# Patient Record
Sex: Male | Born: 1940
Health system: Southern US, Community
[De-identification: ages and names within clinical notes are randomized; demographics above are authoritative.]

## PROBLEM LIST (undated history)

## (undated) DIAGNOSIS — M25559 Pain in unspecified hip: Secondary | ICD-10-CM

## (undated) DIAGNOSIS — M545 Low back pain, unspecified: Secondary | ICD-10-CM

## (undated) DIAGNOSIS — R972 Elevated prostate specific antigen [PSA]: Secondary | ICD-10-CM

## (undated) DIAGNOSIS — L720 Epidermal cyst: Secondary | ICD-10-CM

## (undated) DIAGNOSIS — I10 Essential (primary) hypertension: Secondary | ICD-10-CM

## (undated) DIAGNOSIS — L300 Nummular dermatitis: Secondary | ICD-10-CM

## (undated) DIAGNOSIS — H40059 Ocular hypertension, unspecified eye: Secondary | ICD-10-CM

## (undated) DIAGNOSIS — M1990 Unspecified osteoarthritis, unspecified site: Secondary | ICD-10-CM

## (undated) DIAGNOSIS — H53001 Unspecified amblyopia, right eye: Secondary | ICD-10-CM

## (undated) DIAGNOSIS — D229 Melanocytic nevi, unspecified: Secondary | ICD-10-CM

## (undated) DIAGNOSIS — R300 Dysuria: Secondary | ICD-10-CM

## (undated) DIAGNOSIS — B399 Histoplasmosis, unspecified: Secondary | ICD-10-CM

## (undated) DIAGNOSIS — H52221 Regular astigmatism, right eye: Secondary | ICD-10-CM

## (undated) DIAGNOSIS — N529 Male erectile dysfunction, unspecified: Secondary | ICD-10-CM

## (undated) DIAGNOSIS — H259 Unspecified age-related cataract: Secondary | ICD-10-CM

## (undated) DIAGNOSIS — M720 Palmar fascial fibromatosis [Dupuytren]: Secondary | ICD-10-CM

## (undated) DIAGNOSIS — C801 Malignant (primary) neoplasm, unspecified: Secondary | ICD-10-CM

## (undated) DIAGNOSIS — R7302 Impaired glucose tolerance (oral): Secondary | ICD-10-CM

## (undated) DIAGNOSIS — H2510 Age-related nuclear cataract, unspecified eye: Secondary | ICD-10-CM

## (undated) DIAGNOSIS — M109 Gout, unspecified: Secondary | ICD-10-CM

## (undated) DIAGNOSIS — Z125 Encounter for screening for malignant neoplasm of prostate: Secondary | ICD-10-CM

## (undated) DIAGNOSIS — Z8042 Family history of malignant neoplasm of prostate: Secondary | ICD-10-CM

## (undated) DIAGNOSIS — E785 Hyperlipidemia, unspecified: Secondary | ICD-10-CM

## (undated) DIAGNOSIS — M2550 Pain in unspecified joint: Secondary | ICD-10-CM

## (undated) DIAGNOSIS — H5202 Hypermetropia, left eye: Secondary | ICD-10-CM

## (undated) DIAGNOSIS — M25512 Pain in left shoulder: Secondary | ICD-10-CM

## (undated) HISTORY — PX: CONSTRICTING FINGER RING EXCISION: SUR505

## (undated) HISTORY — PX: OTHER SURGICAL HISTORY: SHX169

## (undated) HISTORY — PX: CYST REMOVAL TRUNK: SHX6283

---

## 2000-09-12 ENCOUNTER — Ambulatory Visit (HOSPITAL_COMMUNITY): Admission: RE | Admit: 2000-09-12 | Discharge: 2000-09-12 | Payer: Self-pay | Admitting: Gastroenterology

## 2011-08-24 DIAGNOSIS — Z125 Encounter for screening for malignant neoplasm of prostate: Secondary | ICD-10-CM | POA: Diagnosis not present

## 2011-08-24 DIAGNOSIS — R82998 Other abnormal findings in urine: Secondary | ICD-10-CM | POA: Diagnosis not present

## 2011-08-24 DIAGNOSIS — M109 Gout, unspecified: Secondary | ICD-10-CM | POA: Diagnosis not present

## 2011-08-24 DIAGNOSIS — I10 Essential (primary) hypertension: Secondary | ICD-10-CM | POA: Diagnosis not present

## 2011-08-24 DIAGNOSIS — E785 Hyperlipidemia, unspecified: Secondary | ICD-10-CM | POA: Diagnosis not present

## 2011-08-31 DIAGNOSIS — I10 Essential (primary) hypertension: Secondary | ICD-10-CM | POA: Diagnosis not present

## 2011-08-31 DIAGNOSIS — Z0000 Encounter for general adult medical examination without abnormal findings: Secondary | ICD-10-CM | POA: Diagnosis not present

## 2011-08-31 DIAGNOSIS — Z125 Encounter for screening for malignant neoplasm of prostate: Secondary | ICD-10-CM | POA: Diagnosis not present

## 2011-08-31 DIAGNOSIS — E785 Hyperlipidemia, unspecified: Secondary | ICD-10-CM | POA: Diagnosis not present

## 2011-08-31 DIAGNOSIS — R7301 Impaired fasting glucose: Secondary | ICD-10-CM | POA: Diagnosis not present

## 2011-08-31 DIAGNOSIS — E739 Lactose intolerance, unspecified: Secondary | ICD-10-CM | POA: Diagnosis not present

## 2011-09-03 DIAGNOSIS — D239 Other benign neoplasm of skin, unspecified: Secondary | ICD-10-CM | POA: Diagnosis not present

## 2011-09-03 DIAGNOSIS — L723 Sebaceous cyst: Secondary | ICD-10-CM | POA: Diagnosis not present

## 2011-09-06 DIAGNOSIS — Z1212 Encounter for screening for malignant neoplasm of rectum: Secondary | ICD-10-CM | POA: Diagnosis not present

## 2011-11-05 DIAGNOSIS — M25559 Pain in unspecified hip: Secondary | ICD-10-CM | POA: Diagnosis not present

## 2011-11-05 DIAGNOSIS — M545 Low back pain, unspecified: Secondary | ICD-10-CM | POA: Diagnosis not present

## 2012-02-27 DIAGNOSIS — H31009 Unspecified chorioretinal scars, unspecified eye: Secondary | ICD-10-CM | POA: Diagnosis not present

## 2012-02-27 DIAGNOSIS — B589 Toxoplasmosis, unspecified: Secondary | ICD-10-CM | POA: Diagnosis not present

## 2012-07-28 DIAGNOSIS — D239 Other benign neoplasm of skin, unspecified: Secondary | ICD-10-CM | POA: Diagnosis not present

## 2012-08-27 DIAGNOSIS — M109 Gout, unspecified: Secondary | ICD-10-CM | POA: Diagnosis not present

## 2012-08-27 DIAGNOSIS — I10 Essential (primary) hypertension: Secondary | ICD-10-CM | POA: Diagnosis not present

## 2012-08-27 DIAGNOSIS — Z79899 Other long term (current) drug therapy: Secondary | ICD-10-CM | POA: Diagnosis not present

## 2012-08-27 DIAGNOSIS — E785 Hyperlipidemia, unspecified: Secondary | ICD-10-CM | POA: Diagnosis not present

## 2012-09-03 DIAGNOSIS — D126 Benign neoplasm of colon, unspecified: Secondary | ICD-10-CM | POA: Diagnosis not present

## 2012-09-03 DIAGNOSIS — Z1331 Encounter for screening for depression: Secondary | ICD-10-CM | POA: Diagnosis not present

## 2012-09-03 DIAGNOSIS — H5440 Blindness, one eye, unspecified eye: Secondary | ICD-10-CM | POA: Diagnosis not present

## 2012-09-03 DIAGNOSIS — M545 Low back pain: Secondary | ICD-10-CM | POA: Diagnosis not present

## 2012-09-03 DIAGNOSIS — I10 Essential (primary) hypertension: Secondary | ICD-10-CM | POA: Diagnosis not present

## 2012-09-03 DIAGNOSIS — N529 Male erectile dysfunction, unspecified: Secondary | ICD-10-CM | POA: Diagnosis not present

## 2012-09-03 DIAGNOSIS — Z0000 Encounter for general adult medical examination without abnormal findings: Secondary | ICD-10-CM | POA: Diagnosis not present

## 2012-09-03 DIAGNOSIS — M1990 Unspecified osteoarthritis, unspecified site: Secondary | ICD-10-CM | POA: Diagnosis not present

## 2012-09-03 DIAGNOSIS — Z125 Encounter for screening for malignant neoplasm of prostate: Secondary | ICD-10-CM | POA: Diagnosis not present

## 2012-09-05 DIAGNOSIS — Z1212 Encounter for screening for malignant neoplasm of rectum: Secondary | ICD-10-CM | POA: Diagnosis not present

## 2013-02-26 DIAGNOSIS — H2510 Age-related nuclear cataract, unspecified eye: Secondary | ICD-10-CM | POA: Diagnosis not present

## 2013-03-25 DIAGNOSIS — L723 Sebaceous cyst: Secondary | ICD-10-CM | POA: Diagnosis not present

## 2013-03-25 DIAGNOSIS — B359 Dermatophytosis, unspecified: Secondary | ICD-10-CM | POA: Diagnosis not present

## 2013-08-31 DIAGNOSIS — R82998 Other abnormal findings in urine: Secondary | ICD-10-CM | POA: Diagnosis not present

## 2013-08-31 DIAGNOSIS — E785 Hyperlipidemia, unspecified: Secondary | ICD-10-CM | POA: Diagnosis not present

## 2013-08-31 DIAGNOSIS — I10 Essential (primary) hypertension: Secondary | ICD-10-CM | POA: Diagnosis not present

## 2013-08-31 DIAGNOSIS — M109 Gout, unspecified: Secondary | ICD-10-CM | POA: Diagnosis not present

## 2013-08-31 DIAGNOSIS — R7301 Impaired fasting glucose: Secondary | ICD-10-CM | POA: Diagnosis not present

## 2013-08-31 DIAGNOSIS — Z125 Encounter for screening for malignant neoplasm of prostate: Secondary | ICD-10-CM | POA: Diagnosis not present

## 2013-09-07 DIAGNOSIS — E669 Obesity, unspecified: Secondary | ICD-10-CM | POA: Diagnosis not present

## 2013-09-07 DIAGNOSIS — M109 Gout, unspecified: Secondary | ICD-10-CM | POA: Diagnosis not present

## 2013-09-07 DIAGNOSIS — R7301 Impaired fasting glucose: Secondary | ICD-10-CM | POA: Diagnosis not present

## 2013-09-07 DIAGNOSIS — Z1212 Encounter for screening for malignant neoplasm of rectum: Secondary | ICD-10-CM | POA: Diagnosis not present

## 2013-09-07 DIAGNOSIS — Z0000 Encounter for general adult medical examination without abnormal findings: Secondary | ICD-10-CM | POA: Diagnosis not present

## 2013-09-07 DIAGNOSIS — I10 Essential (primary) hypertension: Secondary | ICD-10-CM | POA: Diagnosis not present

## 2013-09-07 DIAGNOSIS — D126 Benign neoplasm of colon, unspecified: Secondary | ICD-10-CM | POA: Diagnosis not present

## 2013-09-07 DIAGNOSIS — Z1331 Encounter for screening for depression: Secondary | ICD-10-CM | POA: Diagnosis not present

## 2013-09-07 DIAGNOSIS — M1990 Unspecified osteoarthritis, unspecified site: Secondary | ICD-10-CM | POA: Diagnosis not present

## 2013-09-07 DIAGNOSIS — E785 Hyperlipidemia, unspecified: Secondary | ICD-10-CM | POA: Diagnosis not present

## 2013-12-23 DIAGNOSIS — L259 Unspecified contact dermatitis, unspecified cause: Secondary | ICD-10-CM | POA: Diagnosis not present

## 2014-03-17 DIAGNOSIS — H2589 Other age-related cataract: Secondary | ICD-10-CM | POA: Diagnosis not present

## 2014-03-17 DIAGNOSIS — H52229 Regular astigmatism, unspecified eye: Secondary | ICD-10-CM | POA: Diagnosis not present

## 2014-03-17 DIAGNOSIS — H219 Unspecified disorder of iris and ciliary body: Secondary | ICD-10-CM | POA: Diagnosis not present

## 2014-03-17 DIAGNOSIS — H32 Chorioretinal disorders in diseases classified elsewhere: Secondary | ICD-10-CM | POA: Diagnosis not present

## 2014-03-17 DIAGNOSIS — B399 Histoplasmosis, unspecified: Secondary | ICD-10-CM | POA: Diagnosis not present

## 2014-03-17 DIAGNOSIS — H35039 Hypertensive retinopathy, unspecified eye: Secondary | ICD-10-CM | POA: Diagnosis not present

## 2014-08-05 DIAGNOSIS — H25819 Combined forms of age-related cataract, unspecified eye: Secondary | ICD-10-CM | POA: Diagnosis not present

## 2014-09-20 DIAGNOSIS — Z125 Encounter for screening for malignant neoplasm of prostate: Secondary | ICD-10-CM | POA: Diagnosis not present

## 2014-09-20 DIAGNOSIS — R7302 Impaired glucose tolerance (oral): Secondary | ICD-10-CM | POA: Diagnosis not present

## 2014-09-20 DIAGNOSIS — E785 Hyperlipidemia, unspecified: Secondary | ICD-10-CM | POA: Diagnosis not present

## 2014-09-20 DIAGNOSIS — M109 Gout, unspecified: Secondary | ICD-10-CM | POA: Diagnosis not present

## 2014-09-20 DIAGNOSIS — I10 Essential (primary) hypertension: Secondary | ICD-10-CM | POA: Diagnosis not present

## 2014-09-20 DIAGNOSIS — Z008 Encounter for other general examination: Secondary | ICD-10-CM | POA: Diagnosis not present

## 2014-09-27 DIAGNOSIS — Z6832 Body mass index (BMI) 32.0-32.9, adult: Secondary | ICD-10-CM | POA: Diagnosis not present

## 2014-09-27 DIAGNOSIS — M545 Low back pain: Secondary | ICD-10-CM | POA: Diagnosis not present

## 2014-09-27 DIAGNOSIS — H5441 Blindness, right eye, normal vision left eye: Secondary | ICD-10-CM | POA: Diagnosis not present

## 2014-09-27 DIAGNOSIS — Z1212 Encounter for screening for malignant neoplasm of rectum: Secondary | ICD-10-CM | POA: Diagnosis not present

## 2014-09-27 DIAGNOSIS — D126 Benign neoplasm of colon, unspecified: Secondary | ICD-10-CM | POA: Diagnosis not present

## 2014-09-27 DIAGNOSIS — I10 Essential (primary) hypertension: Secondary | ICD-10-CM | POA: Diagnosis not present

## 2014-09-27 DIAGNOSIS — E785 Hyperlipidemia, unspecified: Secondary | ICD-10-CM | POA: Diagnosis not present

## 2014-09-27 DIAGNOSIS — M1990 Unspecified osteoarthritis, unspecified site: Secondary | ICD-10-CM | POA: Diagnosis not present

## 2014-09-27 DIAGNOSIS — M109 Gout, unspecified: Secondary | ICD-10-CM | POA: Diagnosis not present

## 2014-09-27 DIAGNOSIS — Z0000 Encounter for general adult medical examination without abnormal findings: Secondary | ICD-10-CM | POA: Diagnosis not present

## 2014-09-27 DIAGNOSIS — Z23 Encounter for immunization: Secondary | ICD-10-CM | POA: Diagnosis not present

## 2014-09-27 DIAGNOSIS — R7302 Impaired glucose tolerance (oral): Secondary | ICD-10-CM | POA: Diagnosis not present

## 2014-10-13 DIAGNOSIS — M25512 Pain in left shoulder: Secondary | ICD-10-CM | POA: Diagnosis not present

## 2014-10-15 DIAGNOSIS — B958 Unspecified staphylococcus as the cause of diseases classified elsewhere: Secondary | ICD-10-CM | POA: Diagnosis not present

## 2014-10-15 DIAGNOSIS — L723 Sebaceous cyst: Secondary | ICD-10-CM | POA: Diagnosis not present

## 2014-10-15 DIAGNOSIS — T798XXA Other early complications of trauma, initial encounter: Secondary | ICD-10-CM | POA: Diagnosis not present

## 2014-10-22 DIAGNOSIS — Z1212 Encounter for screening for malignant neoplasm of rectum: Secondary | ICD-10-CM | POA: Diagnosis not present

## 2015-02-01 DIAGNOSIS — L723 Sebaceous cyst: Secondary | ICD-10-CM | POA: Diagnosis not present

## 2015-02-01 DIAGNOSIS — D239 Other benign neoplasm of skin, unspecified: Secondary | ICD-10-CM | POA: Diagnosis not present

## 2015-03-02 DIAGNOSIS — B399 Histoplasmosis, unspecified: Secondary | ICD-10-CM | POA: Diagnosis not present

## 2015-03-02 DIAGNOSIS — H25813 Combined forms of age-related cataract, bilateral: Secondary | ICD-10-CM | POA: Diagnosis not present

## 2015-03-02 DIAGNOSIS — H5202 Hypermetropia, left eye: Secondary | ICD-10-CM | POA: Diagnosis not present

## 2015-03-02 DIAGNOSIS — H31003 Unspecified chorioretinal scars, bilateral: Secondary | ICD-10-CM | POA: Diagnosis not present

## 2015-03-02 DIAGNOSIS — H52222 Regular astigmatism, left eye: Secondary | ICD-10-CM | POA: Diagnosis not present

## 2015-07-18 DIAGNOSIS — M720 Palmar fascial fibromatosis [Dupuytren]: Secondary | ICD-10-CM | POA: Diagnosis not present

## 2015-08-23 ENCOUNTER — Other Ambulatory Visit: Payer: Self-pay | Admitting: Orthopedic Surgery

## 2015-08-23 DIAGNOSIS — G8918 Other acute postprocedural pain: Secondary | ICD-10-CM | POA: Diagnosis not present

## 2015-08-23 DIAGNOSIS — M720 Palmar fascial fibromatosis [Dupuytren]: Secondary | ICD-10-CM | POA: Diagnosis not present

## 2015-09-05 DIAGNOSIS — M720 Palmar fascial fibromatosis [Dupuytren]: Secondary | ICD-10-CM | POA: Diagnosis not present

## 2015-09-07 DIAGNOSIS — Z4889 Encounter for other specified surgical aftercare: Secondary | ICD-10-CM | POA: Diagnosis not present

## 2015-09-07 DIAGNOSIS — M720 Palmar fascial fibromatosis [Dupuytren]: Secondary | ICD-10-CM | POA: Diagnosis not present

## 2015-09-12 DIAGNOSIS — M720 Palmar fascial fibromatosis [Dupuytren]: Secondary | ICD-10-CM | POA: Diagnosis not present

## 2015-09-19 DIAGNOSIS — M720 Palmar fascial fibromatosis [Dupuytren]: Secondary | ICD-10-CM | POA: Diagnosis not present

## 2015-10-03 DIAGNOSIS — M109 Gout, unspecified: Secondary | ICD-10-CM | POA: Diagnosis not present

## 2015-10-03 DIAGNOSIS — E784 Other hyperlipidemia: Secondary | ICD-10-CM | POA: Diagnosis not present

## 2015-10-03 DIAGNOSIS — N390 Urinary tract infection, site not specified: Secondary | ICD-10-CM | POA: Diagnosis not present

## 2015-10-03 DIAGNOSIS — Z125 Encounter for screening for malignant neoplasm of prostate: Secondary | ICD-10-CM | POA: Diagnosis not present

## 2015-10-03 DIAGNOSIS — R7302 Impaired glucose tolerance (oral): Secondary | ICD-10-CM | POA: Diagnosis not present

## 2015-10-03 DIAGNOSIS — R8299 Other abnormal findings in urine: Secondary | ICD-10-CM | POA: Diagnosis not present

## 2015-10-03 DIAGNOSIS — I10 Essential (primary) hypertension: Secondary | ICD-10-CM | POA: Diagnosis not present

## 2015-10-04 DIAGNOSIS — Z4789 Encounter for other orthopedic aftercare: Secondary | ICD-10-CM | POA: Diagnosis not present

## 2015-10-04 DIAGNOSIS — M720 Palmar fascial fibromatosis [Dupuytren]: Secondary | ICD-10-CM | POA: Diagnosis not present

## 2015-10-10 DIAGNOSIS — H5441 Blindness, right eye, normal vision left eye: Secondary | ICD-10-CM | POA: Diagnosis not present

## 2015-10-10 DIAGNOSIS — D126 Benign neoplasm of colon, unspecified: Secondary | ICD-10-CM | POA: Diagnosis not present

## 2015-10-10 DIAGNOSIS — N400 Enlarged prostate without lower urinary tract symptoms: Secondary | ICD-10-CM | POA: Diagnosis not present

## 2015-10-10 DIAGNOSIS — R7302 Impaired glucose tolerance (oral): Secondary | ICD-10-CM | POA: Diagnosis not present

## 2015-10-10 DIAGNOSIS — I10 Essential (primary) hypertension: Secondary | ICD-10-CM | POA: Diagnosis not present

## 2015-10-10 DIAGNOSIS — Z0000 Encounter for general adult medical examination without abnormal findings: Secondary | ICD-10-CM | POA: Diagnosis not present

## 2015-10-10 DIAGNOSIS — M109 Gout, unspecified: Secondary | ICD-10-CM | POA: Diagnosis not present

## 2015-10-10 DIAGNOSIS — E669 Obesity, unspecified: Secondary | ICD-10-CM | POA: Diagnosis not present

## 2015-10-10 DIAGNOSIS — M1990 Unspecified osteoarthritis, unspecified site: Secondary | ICD-10-CM | POA: Diagnosis not present

## 2015-10-10 DIAGNOSIS — E784 Other hyperlipidemia: Secondary | ICD-10-CM | POA: Diagnosis not present

## 2015-10-10 DIAGNOSIS — Z1389 Encounter for screening for other disorder: Secondary | ICD-10-CM | POA: Diagnosis not present

## 2015-10-10 DIAGNOSIS — Z6833 Body mass index (BMI) 33.0-33.9, adult: Secondary | ICD-10-CM | POA: Diagnosis not present

## 2015-10-18 DIAGNOSIS — Z1212 Encounter for screening for malignant neoplasm of rectum: Secondary | ICD-10-CM | POA: Diagnosis not present

## 2015-11-03 DIAGNOSIS — Z4789 Encounter for other orthopedic aftercare: Secondary | ICD-10-CM | POA: Diagnosis not present

## 2016-02-15 DIAGNOSIS — D239 Other benign neoplasm of skin, unspecified: Secondary | ICD-10-CM | POA: Diagnosis not present

## 2016-02-15 DIAGNOSIS — L300 Nummular dermatitis: Secondary | ICD-10-CM | POA: Diagnosis not present

## 2016-02-20 DIAGNOSIS — Z86010 Personal history of colonic polyps: Secondary | ICD-10-CM | POA: Diagnosis not present

## 2016-02-20 DIAGNOSIS — K5730 Diverticulosis of large intestine without perforation or abscess without bleeding: Secondary | ICD-10-CM | POA: Diagnosis not present

## 2016-03-01 DIAGNOSIS — H52222 Regular astigmatism, left eye: Secondary | ICD-10-CM | POA: Diagnosis not present

## 2016-03-01 DIAGNOSIS — H5202 Hypermetropia, left eye: Secondary | ICD-10-CM | POA: Diagnosis not present

## 2016-03-01 DIAGNOSIS — H53001 Unspecified amblyopia, right eye: Secondary | ICD-10-CM | POA: Diagnosis not present

## 2016-03-01 DIAGNOSIS — H25813 Combined forms of age-related cataract, bilateral: Secondary | ICD-10-CM | POA: Diagnosis not present

## 2016-03-01 DIAGNOSIS — B399 Histoplasmosis, unspecified: Secondary | ICD-10-CM | POA: Diagnosis not present

## 2016-04-07 DIAGNOSIS — Z23 Encounter for immunization: Secondary | ICD-10-CM | POA: Diagnosis not present

## 2016-10-15 DIAGNOSIS — R8299 Other abnormal findings in urine: Secondary | ICD-10-CM | POA: Diagnosis not present

## 2016-10-15 DIAGNOSIS — E784 Other hyperlipidemia: Secondary | ICD-10-CM | POA: Diagnosis not present

## 2016-10-15 DIAGNOSIS — N390 Urinary tract infection, site not specified: Secondary | ICD-10-CM | POA: Diagnosis not present

## 2016-10-15 DIAGNOSIS — M109 Gout, unspecified: Secondary | ICD-10-CM | POA: Diagnosis not present

## 2016-10-15 DIAGNOSIS — R7302 Impaired glucose tolerance (oral): Secondary | ICD-10-CM | POA: Diagnosis not present

## 2016-10-15 DIAGNOSIS — Z125 Encounter for screening for malignant neoplasm of prostate: Secondary | ICD-10-CM | POA: Diagnosis not present

## 2016-10-15 DIAGNOSIS — I10 Essential (primary) hypertension: Secondary | ICD-10-CM | POA: Diagnosis not present

## 2016-10-22 DIAGNOSIS — N400 Enlarged prostate without lower urinary tract symptoms: Secondary | ICD-10-CM | POA: Diagnosis not present

## 2016-10-22 DIAGNOSIS — I10 Essential (primary) hypertension: Secondary | ICD-10-CM | POA: Diagnosis not present

## 2016-10-22 DIAGNOSIS — E669 Obesity, unspecified: Secondary | ICD-10-CM | POA: Diagnosis not present

## 2016-10-22 DIAGNOSIS — E784 Other hyperlipidemia: Secondary | ICD-10-CM | POA: Diagnosis not present

## 2016-10-22 DIAGNOSIS — R7302 Impaired glucose tolerance (oral): Secondary | ICD-10-CM | POA: Diagnosis not present

## 2016-10-22 DIAGNOSIS — D126 Benign neoplasm of colon, unspecified: Secondary | ICD-10-CM | POA: Diagnosis not present

## 2016-10-22 DIAGNOSIS — Z6833 Body mass index (BMI) 33.0-33.9, adult: Secondary | ICD-10-CM | POA: Diagnosis not present

## 2016-10-22 DIAGNOSIS — Z0000 Encounter for general adult medical examination without abnormal findings: Secondary | ICD-10-CM | POA: Diagnosis not present

## 2016-10-22 DIAGNOSIS — Z1389 Encounter for screening for other disorder: Secondary | ICD-10-CM | POA: Diagnosis not present

## 2016-10-22 DIAGNOSIS — M109 Gout, unspecified: Secondary | ICD-10-CM | POA: Diagnosis not present

## 2016-10-22 DIAGNOSIS — M1990 Unspecified osteoarthritis, unspecified site: Secondary | ICD-10-CM | POA: Diagnosis not present

## 2016-11-26 DIAGNOSIS — I10 Essential (primary) hypertension: Secondary | ICD-10-CM | POA: Diagnosis not present

## 2016-11-26 DIAGNOSIS — E669 Obesity, unspecified: Secondary | ICD-10-CM | POA: Diagnosis not present

## 2016-11-26 DIAGNOSIS — Z6833 Body mass index (BMI) 33.0-33.9, adult: Secondary | ICD-10-CM | POA: Diagnosis not present

## 2017-02-20 DIAGNOSIS — L720 Epidermal cyst: Secondary | ICD-10-CM | POA: Diagnosis not present

## 2017-02-20 DIAGNOSIS — L821 Other seborrheic keratosis: Secondary | ICD-10-CM | POA: Diagnosis not present

## 2017-02-20 DIAGNOSIS — L959 Vasculitis limited to the skin, unspecified: Secondary | ICD-10-CM | POA: Diagnosis not present

## 2017-02-20 DIAGNOSIS — D229 Melanocytic nevi, unspecified: Secondary | ICD-10-CM | POA: Diagnosis not present

## 2017-03-06 DIAGNOSIS — H2512 Age-related nuclear cataract, left eye: Secondary | ICD-10-CM | POA: Diagnosis not present

## 2017-03-06 DIAGNOSIS — H524 Presbyopia: Secondary | ICD-10-CM | POA: Diagnosis not present

## 2017-03-06 DIAGNOSIS — H5202 Hypermetropia, left eye: Secondary | ICD-10-CM | POA: Diagnosis not present

## 2017-03-06 DIAGNOSIS — H52222 Regular astigmatism, left eye: Secondary | ICD-10-CM | POA: Diagnosis not present

## 2017-03-06 DIAGNOSIS — B399 Histoplasmosis, unspecified: Secondary | ICD-10-CM | POA: Diagnosis not present

## 2017-04-22 DIAGNOSIS — Z6833 Body mass index (BMI) 33.0-33.9, adult: Secondary | ICD-10-CM | POA: Diagnosis not present

## 2017-04-22 DIAGNOSIS — R7302 Impaired glucose tolerance (oral): Secondary | ICD-10-CM | POA: Diagnosis not present

## 2017-04-22 DIAGNOSIS — I10 Essential (primary) hypertension: Secondary | ICD-10-CM | POA: Diagnosis not present

## 2017-04-22 DIAGNOSIS — E7849 Other hyperlipidemia: Secondary | ICD-10-CM | POA: Diagnosis not present

## 2017-04-22 DIAGNOSIS — M109 Gout, unspecified: Secondary | ICD-10-CM | POA: Diagnosis not present

## 2017-04-22 DIAGNOSIS — E668 Other obesity: Secondary | ICD-10-CM | POA: Diagnosis not present

## 2017-04-22 DIAGNOSIS — Z23 Encounter for immunization: Secondary | ICD-10-CM | POA: Diagnosis not present

## 2017-11-06 DIAGNOSIS — Z125 Encounter for screening for malignant neoplasm of prostate: Secondary | ICD-10-CM | POA: Diagnosis not present

## 2017-11-06 DIAGNOSIS — R82998 Other abnormal findings in urine: Secondary | ICD-10-CM | POA: Diagnosis not present

## 2017-11-06 DIAGNOSIS — R7302 Impaired glucose tolerance (oral): Secondary | ICD-10-CM | POA: Diagnosis not present

## 2017-11-06 DIAGNOSIS — I10 Essential (primary) hypertension: Secondary | ICD-10-CM | POA: Diagnosis not present

## 2017-11-06 DIAGNOSIS — E7849 Other hyperlipidemia: Secondary | ICD-10-CM | POA: Diagnosis not present

## 2017-11-06 DIAGNOSIS — M109 Gout, unspecified: Secondary | ICD-10-CM | POA: Diagnosis not present

## 2017-11-13 DIAGNOSIS — D126 Benign neoplasm of colon, unspecified: Secondary | ICD-10-CM | POA: Diagnosis not present

## 2017-11-13 DIAGNOSIS — E7849 Other hyperlipidemia: Secondary | ICD-10-CM | POA: Diagnosis not present

## 2017-11-13 DIAGNOSIS — M21942 Unspecified acquired deformity of hand, left hand: Secondary | ICD-10-CM | POA: Diagnosis not present

## 2017-11-13 DIAGNOSIS — M109 Gout, unspecified: Secondary | ICD-10-CM | POA: Diagnosis not present

## 2017-11-13 DIAGNOSIS — I10 Essential (primary) hypertension: Secondary | ICD-10-CM | POA: Diagnosis not present

## 2017-11-13 DIAGNOSIS — Z1389 Encounter for screening for other disorder: Secondary | ICD-10-CM | POA: Diagnosis not present

## 2017-11-13 DIAGNOSIS — E668 Other obesity: Secondary | ICD-10-CM | POA: Diagnosis not present

## 2017-11-13 DIAGNOSIS — M1990 Unspecified osteoarthritis, unspecified site: Secondary | ICD-10-CM | POA: Diagnosis not present

## 2017-11-13 DIAGNOSIS — R7302 Impaired glucose tolerance (oral): Secondary | ICD-10-CM | POA: Diagnosis not present

## 2017-11-13 DIAGNOSIS — Z6832 Body mass index (BMI) 32.0-32.9, adult: Secondary | ICD-10-CM | POA: Diagnosis not present

## 2017-11-13 DIAGNOSIS — Z0000 Encounter for general adult medical examination without abnormal findings: Secondary | ICD-10-CM | POA: Diagnosis not present

## 2017-11-13 DIAGNOSIS — N400 Enlarged prostate without lower urinary tract symptoms: Secondary | ICD-10-CM | POA: Diagnosis not present

## 2017-11-15 DIAGNOSIS — Z1212 Encounter for screening for malignant neoplasm of rectum: Secondary | ICD-10-CM | POA: Diagnosis not present

## 2018-01-03 DIAGNOSIS — M7581 Other shoulder lesions, right shoulder: Secondary | ICD-10-CM | POA: Diagnosis not present

## 2018-02-19 DIAGNOSIS — L720 Epidermal cyst: Secondary | ICD-10-CM | POA: Diagnosis not present

## 2018-02-19 DIAGNOSIS — D229 Melanocytic nevi, unspecified: Secondary | ICD-10-CM | POA: Diagnosis not present

## 2018-02-19 DIAGNOSIS — L959 Vasculitis limited to the skin, unspecified: Secondary | ICD-10-CM | POA: Diagnosis not present

## 2018-03-08 DIAGNOSIS — Z23 Encounter for immunization: Secondary | ICD-10-CM | POA: Diagnosis not present

## 2018-04-25 DIAGNOSIS — H5202 Hypermetropia, left eye: Secondary | ICD-10-CM | POA: Diagnosis not present

## 2018-04-25 DIAGNOSIS — B399 Histoplasmosis, unspecified: Secondary | ICD-10-CM | POA: Diagnosis not present

## 2018-04-25 DIAGNOSIS — H52222 Regular astigmatism, left eye: Secondary | ICD-10-CM | POA: Diagnosis not present

## 2018-04-25 DIAGNOSIS — H25813 Combined forms of age-related cataract, bilateral: Secondary | ICD-10-CM | POA: Diagnosis not present

## 2018-04-25 DIAGNOSIS — H53001 Unspecified amblyopia, right eye: Secondary | ICD-10-CM | POA: Diagnosis not present

## 2018-04-25 DIAGNOSIS — H33309 Unspecified retinal break, unspecified eye: Secondary | ICD-10-CM | POA: Diagnosis not present

## 2018-05-12 DIAGNOSIS — Z6831 Body mass index (BMI) 31.0-31.9, adult: Secondary | ICD-10-CM | POA: Diagnosis not present

## 2018-05-12 DIAGNOSIS — R7302 Impaired glucose tolerance (oral): Secondary | ICD-10-CM | POA: Diagnosis not present

## 2018-05-12 DIAGNOSIS — E668 Other obesity: Secondary | ICD-10-CM | POA: Diagnosis not present

## 2018-05-12 DIAGNOSIS — E7849 Other hyperlipidemia: Secondary | ICD-10-CM | POA: Diagnosis not present

## 2018-05-12 DIAGNOSIS — M109 Gout, unspecified: Secondary | ICD-10-CM | POA: Diagnosis not present

## 2018-05-12 DIAGNOSIS — I10 Essential (primary) hypertension: Secondary | ICD-10-CM | POA: Diagnosis not present

## 2018-05-12 DIAGNOSIS — M1990 Unspecified osteoarthritis, unspecified site: Secondary | ICD-10-CM | POA: Diagnosis not present

## 2018-10-01 DIAGNOSIS — M7581 Other shoulder lesions, right shoulder: Secondary | ICD-10-CM | POA: Diagnosis not present

## 2018-10-28 DIAGNOSIS — H25813 Combined forms of age-related cataract, bilateral: Secondary | ICD-10-CM | POA: Diagnosis not present

## 2018-10-28 DIAGNOSIS — H40059 Ocular hypertension, unspecified eye: Secondary | ICD-10-CM | POA: Diagnosis not present

## 2018-11-12 DIAGNOSIS — R7302 Impaired glucose tolerance (oral): Secondary | ICD-10-CM | POA: Diagnosis not present

## 2018-11-12 DIAGNOSIS — E7849 Other hyperlipidemia: Secondary | ICD-10-CM | POA: Diagnosis not present

## 2018-11-12 DIAGNOSIS — I10 Essential (primary) hypertension: Secondary | ICD-10-CM | POA: Diagnosis not present

## 2018-11-12 DIAGNOSIS — Z125 Encounter for screening for malignant neoplasm of prostate: Secondary | ICD-10-CM | POA: Diagnosis not present

## 2018-11-12 DIAGNOSIS — M109 Gout, unspecified: Secondary | ICD-10-CM | POA: Diagnosis not present

## 2018-11-13 DIAGNOSIS — R82998 Other abnormal findings in urine: Secondary | ICD-10-CM | POA: Diagnosis not present

## 2018-11-13 DIAGNOSIS — I10 Essential (primary) hypertension: Secondary | ICD-10-CM | POA: Diagnosis not present

## 2018-11-19 DIAGNOSIS — Z0000 Encounter for general adult medical examination without abnormal findings: Secondary | ICD-10-CM | POA: Diagnosis not present

## 2018-11-19 DIAGNOSIS — E785 Hyperlipidemia, unspecified: Secondary | ICD-10-CM | POA: Diagnosis not present

## 2018-11-19 DIAGNOSIS — R7302 Impaired glucose tolerance (oral): Secondary | ICD-10-CM | POA: Diagnosis not present

## 2018-11-19 DIAGNOSIS — D126 Benign neoplasm of colon, unspecified: Secondary | ICD-10-CM | POA: Diagnosis not present

## 2018-11-19 DIAGNOSIS — M1990 Unspecified osteoarthritis, unspecified site: Secondary | ICD-10-CM | POA: Diagnosis not present

## 2018-11-19 DIAGNOSIS — I10 Essential (primary) hypertension: Secondary | ICD-10-CM | POA: Diagnosis not present

## 2018-11-19 DIAGNOSIS — E669 Obesity, unspecified: Secondary | ICD-10-CM | POA: Diagnosis not present

## 2018-11-19 DIAGNOSIS — H54415A Blindness right eye category 5, normal vision left eye: Secondary | ICD-10-CM | POA: Diagnosis not present

## 2018-11-19 DIAGNOSIS — Z1331 Encounter for screening for depression: Secondary | ICD-10-CM | POA: Diagnosis not present

## 2018-11-19 DIAGNOSIS — M109 Gout, unspecified: Secondary | ICD-10-CM | POA: Diagnosis not present

## 2018-11-19 DIAGNOSIS — N390 Urinary tract infection, site not specified: Secondary | ICD-10-CM | POA: Diagnosis not present

## 2018-11-19 DIAGNOSIS — N401 Enlarged prostate with lower urinary tract symptoms: Secondary | ICD-10-CM | POA: Diagnosis not present

## 2019-02-18 DIAGNOSIS — D229 Melanocytic nevi, unspecified: Secondary | ICD-10-CM | POA: Diagnosis not present

## 2019-02-18 DIAGNOSIS — L720 Epidermal cyst: Secondary | ICD-10-CM | POA: Diagnosis not present

## 2019-02-18 DIAGNOSIS — L821 Other seborrheic keratosis: Secondary | ICD-10-CM | POA: Diagnosis not present

## 2019-03-11 ENCOUNTER — Encounter (HOSPITAL_COMMUNITY): Payer: Self-pay | Admitting: Emergency Medicine

## 2019-03-11 ENCOUNTER — Emergency Department (HOSPITAL_COMMUNITY): Payer: Medicare Other

## 2019-03-11 ENCOUNTER — Emergency Department (HOSPITAL_COMMUNITY)
Admission: EM | Admit: 2019-03-11 | Discharge: 2019-03-12 | Disposition: A | Discharge status: Home / Self Care | Payer: Medicare Other | Attending: Emergency Medicine | Admitting: Emergency Medicine

## 2019-03-11 ENCOUNTER — Other Ambulatory Visit: Payer: Self-pay

## 2019-03-11 DIAGNOSIS — R51 Headache: Secondary | ICD-10-CM | POA: Insufficient documentation

## 2019-03-11 DIAGNOSIS — I10 Essential (primary) hypertension: Secondary | ICD-10-CM | POA: Diagnosis not present

## 2019-03-11 DIAGNOSIS — R29810 Facial weakness: Secondary | ICD-10-CM | POA: Diagnosis not present

## 2019-03-11 DIAGNOSIS — R410 Disorientation, unspecified: Secondary | ICD-10-CM | POA: Diagnosis not present

## 2019-03-11 DIAGNOSIS — R52 Pain, unspecified: Secondary | ICD-10-CM | POA: Diagnosis not present

## 2019-03-11 DIAGNOSIS — M542 Cervicalgia: Secondary | ICD-10-CM | POA: Diagnosis not present

## 2019-03-11 LAB — I-STAT CHEM 8, ED
BUN: 22 mg/dL (ref 8–23)
Calcium, Ion: 1.03 mmol/L — ABNORMAL LOW (ref 1.15–1.40)
Chloride: 105 mmol/L (ref 98–111)
Creatinine, Ser: 1.1 mg/dL (ref 0.61–1.24)
Glucose, Bld: 103 mg/dL — ABNORMAL HIGH (ref 70–99)
HCT: 40 % (ref 39.0–52.0)
Hemoglobin: 13.6 g/dL (ref 13.0–17.0)
Potassium: 4.4 mmol/L (ref 3.5–5.1)
Sodium: 139 mmol/L (ref 135–145)
TCO2: 25 mmol/L (ref 22–32)

## 2019-03-11 LAB — APTT: aPTT: 25 seconds (ref 24–36)

## 2019-03-11 LAB — DIFFERENTIAL
Abs Immature Granulocytes: 0.02 10*3/uL (ref 0.00–0.07)
Basophils Absolute: 0.1 10*3/uL (ref 0.0–0.1)
Basophils Relative: 1 %
Eosinophils Absolute: 0.1 10*3/uL (ref 0.0–0.5)
Eosinophils Relative: 2 %
Immature Granulocytes: 0 %
Lymphocytes Relative: 34 %
Lymphs Abs: 2.5 10*3/uL (ref 0.7–4.0)
Monocytes Absolute: 0.8 10*3/uL (ref 0.1–1.0)
Monocytes Relative: 11 %
Neutro Abs: 3.9 10*3/uL (ref 1.7–7.7)
Neutrophils Relative %: 52 %

## 2019-03-11 LAB — CBC
HCT: 41.7 % (ref 39.0–52.0)
Hemoglobin: 14.7 g/dL (ref 13.0–17.0)
MCH: 32 pg (ref 26.0–34.0)
MCHC: 35.3 g/dL (ref 30.0–36.0)
MCV: 90.8 fL (ref 80.0–100.0)
Platelets: 181 10*3/uL (ref 150–400)
RBC: 4.59 MIL/uL (ref 4.22–5.81)
RDW: 11.6 % (ref 11.5–15.5)
WBC: 7.4 10*3/uL (ref 4.0–10.5)
nRBC: 0 % (ref 0.0–0.2)

## 2019-03-11 LAB — COMPREHENSIVE METABOLIC PANEL
ALT: 29 U/L (ref 0–44)
AST: 29 U/L (ref 15–41)
Albumin: 4.4 g/dL (ref 3.5–5.0)
Alkaline Phosphatase: 86 U/L (ref 38–126)
Anion gap: 10 (ref 5–15)
BUN: 20 mg/dL (ref 8–23)
CO2: 23 mmol/L (ref 22–32)
Calcium: 9 mg/dL (ref 8.9–10.3)
Chloride: 105 mmol/L (ref 98–111)
Creatinine, Ser: 1.15 mg/dL (ref 0.61–1.24)
GFR calc Af Amer: >60 mL/min (ref >60)
GFR calc non Af Amer: >60 mL/min (ref >60)
Glucose, Bld: 108 mg/dL — ABNORMAL HIGH (ref 70–99)
Potassium: 4.4 mmol/L (ref 3.5–5.1)
Sodium: 138 mmol/L (ref 135–145)
Total Bilirubin: 1 mg/dL (ref 0.3–1.2)
Total Protein: 7.1 g/dL (ref 6.5–8.1)

## 2019-03-11 LAB — PROTIME-INR
INR: 1 (ref 0.8–1.2)
Prothrombin Time: 13.1 seconds (ref 11.4–15.2)

## 2019-03-11 LAB — CBG MONITORING, ED: Glucose-Capillary: 102 mg/dL — ABNORMAL HIGH (ref 70–99)

## 2019-03-11 MED ORDER — DIPHENHYDRAMINE HCL 50 MG/ML IJ SOLN
25.0000 mg | Freq: Once | INTRAMUSCULAR | Status: AC
  Administered 2019-03-11: 22:00:00 25 mg via INTRAVENOUS
  Filled 2019-03-11: qty 1

## 2019-03-11 MED ORDER — ACETAMINOPHEN 500 MG PO TABS
1000.0000 mg | ORAL_TABLET | Freq: Once | ORAL | Status: AC
  Administered 2019-03-11: 22:00:00 1000 mg via ORAL
  Filled 2019-03-11: qty 2

## 2019-03-11 MED ORDER — PROCHLORPERAZINE EDISYLATE 10 MG/2ML IJ SOLN
10.0000 mg | Freq: Once | INTRAMUSCULAR | Status: AC
  Administered 2019-03-11: 10 mg via INTRAVENOUS
  Filled 2019-03-11: qty 2

## 2019-03-11 MED ORDER — SODIUM CHLORIDE 0.9% FLUSH: 3.0000 mL | Freq: Once | INTRAVENOUS | Status: DC

## 2019-03-11 NOTE — ED Provider Notes (Signed)
[facility] Emergency Department Provider Note MRN:  [id number]  [place] date & time: 03/11/19     Chief Complaint   Code Stroke   History of Present Illness   Chad Moreno is a 78 y.o. year-old male with no pertinent past medical history presenting to the ED with chief complaint of confusion.  Last seen normal 3:30 PM.  Wife came home from work at 6:30 PM and noted perseveration, confusion, issues with concentration.  Code stroke initiated in triage.  Review of Systems  Positive for confusion.  Patient's Health History   History reviewed. No pertinent past medical history.    History reviewed. No pertinent family history.  Social History   Socioeconomic History  . Marital status: Married    Spouse name: Not on file  . Number of children: Not on file  . Years of education: Not on file  . Highest education level: Not on file  Occupational History  . Not on file  Social Needs  . Financial resource strain: Not on file  . Food insecurity    Worry: Not on file    Inability: Not on file  . Transportation needs    Medical: Not on file    Non-medical: Not on file  Tobacco Use  . Smoking status: Not on file  Substance and Sexual Activity  . Alcohol use: Not on file  . Drug use: Not on file  . Sexual activity: Not on file  Lifestyle  . Physical activity    Days per week: Not on file    Minutes per session: Not on file  . Stress: Not on file  Relationships  . Social Herbalist on phone: Not on file    Gets together: Not on file    Attends religious service: Not on file    Active member of club or organization: Not on file    Attends meetings of clubs or organizations: Not on file    Relationship status: Not on file  . Intimate partner violence    Fear of current or ex partner: Not on file    Emotionally abused: Not on file    Physically abused: Not on file    Forced sexual activity: Not on file  Other Topics Concern  . Not on  file  Social History Narrative  . Not on file     Physical Exam  Vital Signs and Nursing Notes reviewed Vitals:   03/11/19 2145 03/11/19 2200  BP: (!) 173/141 (!) 174/84  Pulse: 93 73  Resp: 20 17  Temp:    SpO2: 100% 98%    CONSTITUTIONAL: Well-appearing, NAD NEURO:  Alert and oriented x 3, no focal deficits EYES:  eyes equal and reactive ENT/NECK:  no LAD, no JVD CARDIO: Regular rate, well-perfused, normal S1 and S2 PULM:  CTAB no wheezing or rhonchi GI/GU:  normal bowel sounds, non-distended, non-tender MSK/SPINE:  No gross deformities, no edema SKIN:  no rash, atraumatic PSYCH:  Appropriate speech and behavior  Diagnostic and Interventional Summary    EKG Interpretation  Date/Time:    Ventricular Rate:    PR Interval:    QRS Duration:   QT Interval:    QTC Calculation:   R Axis:     Text Interpretation:        Labs Reviewed  COMPREHENSIVE METABOLIC PANEL - Abnormal; Notable for the following components:      Result Value   Glucose, Bld 108 (*)    All  other components within normal limits  I-STAT CHEM 8, ED - Abnormal; Notable for the following components:   Glucose, Bld 103 (*)    Calcium, Ion 1.03 (*)    All other components within normal limits  PROTIME-INR  APTT  CBC  DIFFERENTIAL  CBG MONITORING, ED    CT HEAD CODE STROKE WO CONTRAST  Final Result    MR BRAIN WO CONTRAST    (Results Pending)    Medications  sodium chloride flush (NS) 0.9 % injection 3 mL (has no administration in time range)  acetaminophen (TYLENOL) tablet 1,000 mg (1,000 mg Oral Given 03/11/19 2204)  diphenhydrAMINE (BENADRYL) injection 25 mg (25 mg Intravenous Given 03/11/19 2205)  prochlorperazine (COMPAZINE) injection 10 mg (10 mg Intravenous Given 03/11/19 2204)     Procedures Critical Care Critical Care Documentation Critical care time provided by me (excluding procedures): 33 minutes  Condition necessitating critical care: Concern for acute ischemic stroke and/or  hypertensive emergency  Components of critical care management: reviewing of prior records, laboratory and imaging interpretation, frequent re-examination and reassessment of vital signs, initiation of code stroke protocol, discussion with consulting services.    ED Course and Medical Decision Making  I have reviewed the triage vital signs and the nursing notes.  Pertinent labs & imaging results that were available during my care of the patient were reviewed by me and considered in my medical decision making (see below for details).  NIH stroke scale of 0 according to neurology.  Patient is well-appearing, no focal neurological deficits on my exam, denies chest pain or shortness of breath, currently without symptoms.  Patient's wife at bedside states that he is still not quite his normal self in terms of mental sharpness.  CT head is without acute concerns, work-up is unremarkable without signs of endorgan damage.  EKG is without ischemic features.  MRI pending as recommended by neurology.  Patient's blood pressure is improving spontaneously.  With a relatively controlled blood pressure and a normal MRI, there will be no reason to admit the patient and he can be discharged with close PCP follow-up.  Signed out to oncoming provider at shift change.  Chad Kirks. Sedonia Small, MD [place] [email or web address]  Final Clinical Impressions(s) / ED Diagnoses     ICD-10-CM   1. Hypertension, unspecified type  I10   2. Confusion and disorientation  R41.0     ED Discharge Orders    None      Discharge Instructions Discussed with and Provided to Patient: Discharge Instructions   None       Maudie Flakes, MD 03/11/19 2252

## 2019-03-11 NOTE — ED Triage Notes (Signed)
  Patient BIB EMS for code stroke.  Patient was LSN at 1830 and complaining of headache.  Patient was with wife at home and started having repetitive questioning and confusion.  Patient is A&O x4 at this time.  Patient does not remember about 30 minutes from when this episode occurred.  NIH 0 on arrival.

## 2019-03-12 ENCOUNTER — Emergency Department (HOSPITAL_COMMUNITY): Payer: Medicare Other

## 2019-03-12 DIAGNOSIS — R51 Headache: Secondary | ICD-10-CM | POA: Diagnosis not present

## 2019-03-12 DIAGNOSIS — R410 Disorientation, unspecified: Secondary | ICD-10-CM | POA: Diagnosis not present

## 2019-03-12 DIAGNOSIS — I10 Essential (primary) hypertension: Secondary | ICD-10-CM | POA: Diagnosis not present

## 2019-03-12 DIAGNOSIS — E669 Obesity, unspecified: Secondary | ICD-10-CM | POA: Diagnosis not present

## 2019-03-12 DIAGNOSIS — E785 Hyperlipidemia, unspecified: Secondary | ICD-10-CM | POA: Diagnosis not present

## 2019-03-12 DIAGNOSIS — I160 Hypertensive urgency: Secondary | ICD-10-CM | POA: Diagnosis not present

## 2019-03-12 LAB — URINALYSIS, ROUTINE W REFLEX MICROSCOPIC
Bilirubin Urine: NEGATIVE
Glucose, UA: NEGATIVE mg/dL
Hgb urine dipstick: NEGATIVE
Ketones, ur: NEGATIVE mg/dL
Leukocytes,Ua: NEGATIVE
Nitrite: NEGATIVE
Protein, ur: NEGATIVE mg/dL
Specific Gravity, Urine: 1.003 — ABNORMAL LOW (ref 1.005–1.030)
pH: 7 (ref 5.0–8.0)

## 2019-03-12 LAB — GLUCOSE, CAPILLARY: Glucose-Capillary: 107 mg/dL — ABNORMAL HIGH (ref 70–99)

## 2019-03-12 NOTE — Discharge Instructions (Signed)
Your MRI was normal.  This means its very unlikely that you had a stroke.  Your blood pressure was high today, please follow up with your family doc as they may need to change your medications.  Return for worsening symptoms.

## 2019-03-12 NOTE — Consult Note (Signed)
Neurology Consultation Reason for Consult: Confusion Referring Physician: Sedonia Small, M  CC: Confusion  History is obtained from: Patient, EMS  HPI: Chad Moreno is a 78 y.o. male with a history of hypertension who presents with altered mental status that started earlier today.  He was in his normal state of health earlier and played a round of golf.  Then he went home.  He has a fuzzy period of time during which she was confused and asking similar questions.  EMS reports extremely high blood pressures in the 123456 systolic.  They also report that he asked the same question of "why are we going to the hospital" multiple times.  He also complains of headache which is right-sided   LKW: 6:30 PM tpa given?: no, mild symptoms    ROS: A 14 point ROS was performed and is negative except as noted in the HPI.   Past medical history: Hypertension   Family medical history: Denies similar  Social History: Has not smoked for 40 years  Exam: Current vital signs: BP (!) 158/81   Pulse 64   Temp 98.4 F (36.9 C) (Oral)   Resp 13   Ht 6\' 1"  (1.854 m)   Wt 104.3 kg   SpO2 99%   BMI 30.34 kg/m  Vital signs in last 24 hours: Temp:  [98.4 F (36.9 C)] 98.4 F (36.9 C) (09/16 2115) Pulse Rate:  [44-93] 64 (09/16 2345) Resp:  [12-20] 13 (09/16 2345) BP: (147-196)/(76-141) 158/81 (09/16 2345) SpO2:  [94 %-100 %] 99 % (09/16 2345) Weight:  [104.3 kg] 104.3 kg (09/16 2117)   Physical Exam  Constitutional: Appears well-developed and well-nourished.  Psych: Affect appropriate to situation Eyes: No scleral injection HENT: No OP obstrucion Head: Normocephalic.  Cardiovascular: Normal rate and regular rhythm.  Respiratory: Effort normal, non-labored breathing GI: Soft.  No distension. There is no tenderness.  Skin: WDI  Neuro: Mental Status: Patient is awake, alert, oriented to person, place, month, year, and situation. Patient is able to give a clear and coherent history. No signs  of aphasia or neglect World backwards = "D-L-O-W" Cranial Nerves: II: Visual Fields are full. Pupils are equal, round, and reactive to light.   III,IV, VI: EOMI without ptosis or diploplia.  V: Facial sensation is symmetric to temperature VII: Facial movement is symmetric.  VIII: hearing is intact to voice X: Uvula elevates symmetrically XI: Shoulder shrug is symmetric. XII: tongue is midline without atrophy or fasciculations.  Motor: Tone is normal. Bulk is normal. 5/5 strength was present in all four extremities.  Sensory: Sensation is symmetric to light touch and temperature in the arms and legs. Cerebellar: FNF and HKS are intact bilaterally  I have reviewed labs in epic and the results pertinent to this consultation are: CMP-unremarkable  I have reviewed the images obtained: CT head-unremarkable  Impression: 78 year old male with headache and confusion in the setting of severe hypertension.  I suspect that this represents mild hypertensive encephalopathy, as his blood pressure is come down he appears to be improving as well.  The description of the character of the confusion does not seem typical for a focal neurological deficit such as aphasia.  The repetitive questioning does broach the possibility of transient global amnesia, however with his BP, would treat this as hypertensive urgency if MRI is negative.   Recommendations: 1) MRI brain, if negative, would treat as hypertensive urgency.    Roland Rack, MD [facility] [phone]  If 7pm- 7am, please page neurology on call  as listed in [place].

## 2019-03-12 NOTE — ED Provider Notes (Signed)
78 yo M with a cc of stroke like symptoms.  The patient had some confusion.  Arrived as a code stroke.  This was called off by the neurologist.  Plan is to obtain an MRI.  If this is negative and the patient's blood pressure is improving we will discharge the patient home.  MRI is returned and is negative for acute stroke.  Will discharge home.   Deno Etienne, DO 03/12/19 0107

## 2019-04-03 DIAGNOSIS — Z23 Encounter for immunization: Secondary | ICD-10-CM | POA: Diagnosis not present

## 2019-05-06 DIAGNOSIS — H40013 Open angle with borderline findings, low risk, bilateral: Secondary | ICD-10-CM | POA: Diagnosis not present

## 2019-05-06 DIAGNOSIS — H53001 Unspecified amblyopia, right eye: Secondary | ICD-10-CM | POA: Diagnosis not present

## 2019-05-06 DIAGNOSIS — H40053 Ocular hypertension, bilateral: Secondary | ICD-10-CM | POA: Diagnosis not present

## 2019-05-06 DIAGNOSIS — H52222 Regular astigmatism, left eye: Secondary | ICD-10-CM | POA: Diagnosis not present

## 2019-05-06 DIAGNOSIS — H5202 Hypermetropia, left eye: Secondary | ICD-10-CM | POA: Diagnosis not present

## 2019-05-06 DIAGNOSIS — H25813 Combined forms of age-related cataract, bilateral: Secondary | ICD-10-CM | POA: Diagnosis not present

## 2019-05-06 DIAGNOSIS — B399 Histoplasmosis, unspecified: Secondary | ICD-10-CM | POA: Diagnosis not present

## 2019-05-08 DIAGNOSIS — R410 Disorientation, unspecified: Secondary | ICD-10-CM | POA: Diagnosis not present

## 2019-05-08 DIAGNOSIS — R7302 Impaired glucose tolerance (oral): Secondary | ICD-10-CM | POA: Diagnosis not present

## 2019-05-08 DIAGNOSIS — I10 Essential (primary) hypertension: Secondary | ICD-10-CM | POA: Diagnosis not present

## 2019-05-08 DIAGNOSIS — I160 Hypertensive urgency: Secondary | ICD-10-CM | POA: Diagnosis not present

## 2019-05-08 DIAGNOSIS — E669 Obesity, unspecified: Secondary | ICD-10-CM | POA: Diagnosis not present

## 2019-05-08 DIAGNOSIS — M109 Gout, unspecified: Secondary | ICD-10-CM | POA: Diagnosis not present

## 2019-05-08 DIAGNOSIS — E785 Hyperlipidemia, unspecified: Secondary | ICD-10-CM | POA: Diagnosis not present

## 2019-06-24 DIAGNOSIS — I160 Hypertensive urgency: Secondary | ICD-10-CM | POA: Diagnosis not present

## 2019-07-10 ENCOUNTER — Ambulatory Visit: Payer: Medicare Other | Attending: Internal Medicine

## 2019-07-10 DIAGNOSIS — Z23 Encounter for immunization: Secondary | ICD-10-CM | POA: Insufficient documentation

## 2019-07-10 NOTE — Progress Notes (Signed)
   Covid-19 Vaccination Clinic  Name:  Chad Moreno    MRN: [id number] DOB: 1941-03-02  07/10/2019  Chad Moreno was observed post Covid-19 immunization for 15 minutes without incidence. He was provided with Vaccine Information Sheet and instruction to access the V-Safe system.   Chad Moreno was instructed to call 911 with any severe reactions post vaccine: Marland Kitchen Difficulty breathing  . Swelling of your face and throat  . A fast heartbeat  . A bad rash all over your body  . Dizziness and weakness    Immunizations Administered    Name Date Dose VIS Date Route   Pfizer COVID-19 Vaccine 07/10/2019  9:41 AM 0.3 mL 06/05/2019 Intramuscular   Manufacturer: [place]   Lot: [id number]   [place]: [id number]

## 2019-07-28 ENCOUNTER — Ambulatory Visit: Payer: Medicare Other

## 2019-07-28 ENCOUNTER — Ambulatory Visit: Payer: Medicare Other | Attending: Internal Medicine

## 2019-07-28 DIAGNOSIS — Z23 Encounter for immunization: Secondary | ICD-10-CM

## 2019-07-28 NOTE — Progress Notes (Signed)
   Covid-19 Vaccination Clinic  Name:  Chad Moreno    MRN: [id number] DOB: 04-14-1941  07/28/2019  [name] was observed post Covid-19 immunization for 15 minutes without incidence. He was provided with Vaccine Information Sheet and instruction to access the V-Safe system.   [name] was instructed to call 911 with any severe reactions post vaccine: Marland Kitchen Difficulty breathing  . Swelling of your face and throat  . A fast heartbeat  . A bad rash all over your body  . Dizziness and weakness    Immunizations Administered    Name Date Dose VIS Date Route   Pfizer COVID-19 Vaccine 07/28/2019  8:30 AM 0.3 mL 06/05/2019 Intramuscular   Manufacturer: [place]   Lot: [id number]   [place]: [id number]

## 2019-08-20 DIAGNOSIS — I10 Essential (primary) hypertension: Secondary | ICD-10-CM | POA: Diagnosis not present

## 2019-11-05 DIAGNOSIS — H40019 Open angle with borderline findings, low risk, unspecified eye: Secondary | ICD-10-CM | POA: Diagnosis not present

## 2019-11-05 DIAGNOSIS — I10 Essential (primary) hypertension: Secondary | ICD-10-CM | POA: Diagnosis not present

## 2019-11-05 DIAGNOSIS — H40059 Ocular hypertension, unspecified eye: Secondary | ICD-10-CM | POA: Diagnosis not present

## 2019-11-05 DIAGNOSIS — H35033 Hypertensive retinopathy, bilateral: Secondary | ICD-10-CM | POA: Diagnosis not present

## 2019-11-13 DIAGNOSIS — M109 Gout, unspecified: Secondary | ICD-10-CM | POA: Diagnosis not present

## 2019-11-13 DIAGNOSIS — R7302 Impaired glucose tolerance (oral): Secondary | ICD-10-CM | POA: Diagnosis not present

## 2019-11-13 DIAGNOSIS — Z125 Encounter for screening for malignant neoplasm of prostate: Secondary | ICD-10-CM | POA: Diagnosis not present

## 2019-11-13 DIAGNOSIS — E7849 Other hyperlipidemia: Secondary | ICD-10-CM | POA: Diagnosis not present

## 2019-11-26 DIAGNOSIS — R82998 Other abnormal findings in urine: Secondary | ICD-10-CM | POA: Diagnosis not present

## 2019-11-26 DIAGNOSIS — R410 Disorientation, unspecified: Secondary | ICD-10-CM | POA: Diagnosis not present

## 2019-11-26 DIAGNOSIS — Z0000 Encounter for general adult medical examination without abnormal findings: Secondary | ICD-10-CM | POA: Diagnosis not present

## 2019-11-26 DIAGNOSIS — Z1331 Encounter for screening for depression: Secondary | ICD-10-CM | POA: Diagnosis not present

## 2019-11-26 DIAGNOSIS — N401 Enlarged prostate with lower urinary tract symptoms: Secondary | ICD-10-CM | POA: Diagnosis not present

## 2019-11-26 DIAGNOSIS — M1990 Unspecified osteoarthritis, unspecified site: Secondary | ICD-10-CM | POA: Diagnosis not present

## 2019-11-26 DIAGNOSIS — E669 Obesity, unspecified: Secondary | ICD-10-CM | POA: Diagnosis not present

## 2019-11-26 DIAGNOSIS — D126 Benign neoplasm of colon, unspecified: Secondary | ICD-10-CM | POA: Diagnosis not present

## 2019-11-26 DIAGNOSIS — R7302 Impaired glucose tolerance (oral): Secondary | ICD-10-CM | POA: Diagnosis not present

## 2019-11-26 DIAGNOSIS — M109 Gout, unspecified: Secondary | ICD-10-CM | POA: Diagnosis not present

## 2019-11-26 DIAGNOSIS — H54415A Blindness right eye category 5, normal vision left eye: Secondary | ICD-10-CM | POA: Diagnosis not present

## 2019-11-26 DIAGNOSIS — E7849 Other hyperlipidemia: Secondary | ICD-10-CM | POA: Diagnosis not present

## 2019-11-26 DIAGNOSIS — I10 Essential (primary) hypertension: Secondary | ICD-10-CM | POA: Diagnosis not present

## 2019-12-03 DIAGNOSIS — Z1212 Encounter for screening for malignant neoplasm of rectum: Secondary | ICD-10-CM | POA: Diagnosis not present

## 2020-02-23 ENCOUNTER — Other Ambulatory Visit: Payer: Self-pay

## 2020-02-23 ENCOUNTER — Ambulatory Visit (INDEPENDENT_AMBULATORY_CARE_PROVIDER_SITE_OTHER): Payer: Medicare Other | Admitting: Dermatology

## 2020-02-23 ENCOUNTER — Encounter: Payer: Self-pay | Admitting: Dermatology

## 2020-02-23 DIAGNOSIS — L959 Vasculitis limited to the skin, unspecified: Secondary | ICD-10-CM | POA: Diagnosis not present

## 2020-02-23 DIAGNOSIS — Z1283 Encounter for screening for malignant neoplasm of skin: Secondary | ICD-10-CM

## 2020-02-23 DIAGNOSIS — L729 Follicular cyst of the skin and subcutaneous tissue, unspecified: Secondary | ICD-10-CM

## 2020-02-23 DIAGNOSIS — L820 Inflamed seborrheic keratosis: Secondary | ICD-10-CM | POA: Diagnosis not present

## 2020-02-23 NOTE — Patient Instructions (Addendum)
Several issues discussed today with Chad Moreno.  For several days he has noticed an asymptomatic breaking out on both lower shins/dorsal feet.  Examination showed clusters of nonpalpable micro petechiae strongly suggestive of golfers vasculitis (interestingly this actually did appear late during a golf outing).  Review of systems negative for systemic vasculitis and no changes in nailbeds.  If this does not spontaneously clear over the next few weeks, he may return for biopsy but I do not think this requires evaluation.  A 48 week old tan rough spot on the right inner calf could be either local inflammation or an irritated keratosis.  Once again, if this persists or grows he may return for biopsy.  Similar spot on the left leg historically resolved itself.  Clinically typical epidermoid cysts on the left outer back shoulder and in the interscapular area can be left; I did remind [name] that there is some risk of inflammation particularly on the shoulder lesion which previously had inflamed.

## 2020-02-23 NOTE — Progress Notes (Signed)
   Follow-Up Visit   Subjective  Chad Moreno is a 79 y.o. male who presents for the following: Annual Exam (right and left leg isk? cyst left shoulder no pain, rash on legs weeds from golf). Rash Location: Feet Duration: Several days Quality: May be fading Associated Signs/Symptoms: None Modifying Factors:  Severity:  Timing: Context: Also would like general skin check.  Objective  Well appearing patient in no apparent distress; mood and affect are within normal limits.  A full examination was performed including scalp, head, eyes, ears, nose, lips, neck, chest, axillae, abdomen, back, buttocks, bilateral upper extremities, bilateral lower extremities, hands, feet, fingers, toes, fingernails, and toenails. All findings within normal limits unless otherwise noted below.   Assessment & Plan    Vasculitis limited to skin (2) Right Ankle - Anterior; Left Lower Leg - Anterior  No intervention or evaluation currently indicated, but if he develops new areas or these do not clear in the next few weeks I do want him to return.  Inflamed seborrheic keratosis Right Lower Leg - Anterior  Return for biopsy if there is growth or bleeding.  Cyst of skin (2) Left Shoulder - Posterior; Mid Back  Discussed elective excision; for now Marcello Moores content to leave these.  Skin cancer screening performed today.  Several issues discussed today with Berle Mull.  For several days he has noticed an asymptomatic breaking out on both lower shins/dorsal feet.  Examination showed clusters of nonpalpable micro petechiae strongly suggestive of golfers vasculitis (interestingly this actually did appear late during a golf outing).  Review of systems negative for systemic vasculitis and no changes in nailbeds.  If this does not spontaneously clear over the next few weeks, he may return for biopsy but I do not think this requires evaluation.  A 77 week old tan rough spot on the right inner calf could be  either local inflammation or an irritated keratosis.  Once again, if this persists or grows he may return for biopsy.  Similar spot on the left leg historically resolved itself.  Clinically typical epidermoid cysts on the left outer back shoulder and in the interscapular area can be left; I did remind Mr. Maiden that there is some risk of inflammation particularly on the shoulder lesion which previously had inflamed.  I, Lavonna Monarch, MD, have reviewed all documentation for this visit.  The documentation on 02/23/20 for the exam, diagnosis, procedures, and orders are all accurate and complete.

## 2020-03-30 DIAGNOSIS — Z23 Encounter for immunization: Secondary | ICD-10-CM | POA: Diagnosis not present

## 2020-04-08 DIAGNOSIS — Z23 Encounter for immunization: Secondary | ICD-10-CM | POA: Diagnosis not present

## 2020-05-12 DIAGNOSIS — H35039 Hypertensive retinopathy, unspecified eye: Secondary | ICD-10-CM | POA: Diagnosis not present

## 2020-05-12 DIAGNOSIS — H25813 Combined forms of age-related cataract, bilateral: Secondary | ICD-10-CM | POA: Diagnosis not present

## 2020-05-12 DIAGNOSIS — H3540 Unspecified peripheral retinal degeneration: Secondary | ICD-10-CM | POA: Diagnosis not present

## 2020-05-12 DIAGNOSIS — B399 Histoplasmosis, unspecified: Secondary | ICD-10-CM | POA: Diagnosis not present

## 2020-05-12 DIAGNOSIS — H33309 Unspecified retinal break, unspecified eye: Secondary | ICD-10-CM | POA: Diagnosis not present

## 2020-05-12 DIAGNOSIS — I10 Essential (primary) hypertension: Secondary | ICD-10-CM | POA: Diagnosis not present

## 2020-05-12 DIAGNOSIS — Q130 Coloboma of iris: Secondary | ICD-10-CM | POA: Diagnosis not present

## 2020-05-12 DIAGNOSIS — H52222 Regular astigmatism, left eye: Secondary | ICD-10-CM | POA: Diagnosis not present

## 2020-05-12 DIAGNOSIS — H5202 Hypermetropia, left eye: Secondary | ICD-10-CM | POA: Diagnosis not present

## 2020-05-16 DIAGNOSIS — M25522 Pain in left elbow: Secondary | ICD-10-CM | POA: Diagnosis not present

## 2020-05-27 DIAGNOSIS — E669 Obesity, unspecified: Secondary | ICD-10-CM | POA: Diagnosis not present

## 2020-05-27 DIAGNOSIS — R7302 Impaired glucose tolerance (oral): Secondary | ICD-10-CM | POA: Diagnosis not present

## 2020-05-27 DIAGNOSIS — M109 Gout, unspecified: Secondary | ICD-10-CM | POA: Diagnosis not present

## 2020-05-27 DIAGNOSIS — I10 Essential (primary) hypertension: Secondary | ICD-10-CM | POA: Diagnosis not present

## 2020-05-27 DIAGNOSIS — N401 Enlarged prostate with lower urinary tract symptoms: Secondary | ICD-10-CM | POA: Diagnosis not present

## 2020-05-27 DIAGNOSIS — D126 Benign neoplasm of colon, unspecified: Secondary | ICD-10-CM | POA: Diagnosis not present

## 2020-05-27 DIAGNOSIS — E785 Hyperlipidemia, unspecified: Secondary | ICD-10-CM | POA: Diagnosis not present

## 2020-05-27 DIAGNOSIS — M1990 Unspecified osteoarthritis, unspecified site: Secondary | ICD-10-CM | POA: Diagnosis not present

## 2021-01-15 DIAGNOSIS — Z20822 Contact with and (suspected) exposure to covid-19: Secondary | ICD-10-CM | POA: Diagnosis not present

## 2021-01-20 DIAGNOSIS — U071 COVID-19: Secondary | ICD-10-CM | POA: Diagnosis not present

## 2021-01-20 DIAGNOSIS — Z20822 Contact with and (suspected) exposure to covid-19: Secondary | ICD-10-CM | POA: Diagnosis not present

## 2021-01-23 DIAGNOSIS — M109 Gout, unspecified: Secondary | ICD-10-CM | POA: Diagnosis not present

## 2021-01-23 DIAGNOSIS — Z125 Encounter for screening for malignant neoplasm of prostate: Secondary | ICD-10-CM | POA: Diagnosis not present

## 2021-01-23 DIAGNOSIS — R7302 Impaired glucose tolerance (oral): Secondary | ICD-10-CM | POA: Diagnosis not present

## 2021-01-23 DIAGNOSIS — Z20822 Contact with and (suspected) exposure to covid-19: Secondary | ICD-10-CM | POA: Diagnosis not present

## 2021-01-23 DIAGNOSIS — E785 Hyperlipidemia, unspecified: Secondary | ICD-10-CM | POA: Diagnosis not present

## 2021-01-30 DIAGNOSIS — I10 Essential (primary) hypertension: Secondary | ICD-10-CM | POA: Diagnosis not present

## 2021-01-30 DIAGNOSIS — E669 Obesity, unspecified: Secondary | ICD-10-CM | POA: Diagnosis not present

## 2021-01-30 DIAGNOSIS — Z0000 Encounter for general adult medical examination without abnormal findings: Secondary | ICD-10-CM | POA: Diagnosis not present

## 2021-01-30 DIAGNOSIS — M109 Gout, unspecified: Secondary | ICD-10-CM | POA: Diagnosis not present

## 2021-01-30 DIAGNOSIS — R7302 Impaired glucose tolerance (oral): Secondary | ICD-10-CM | POA: Diagnosis not present

## 2021-01-30 DIAGNOSIS — M1990 Unspecified osteoarthritis, unspecified site: Secondary | ICD-10-CM | POA: Diagnosis not present

## 2021-01-30 DIAGNOSIS — R82998 Other abnormal findings in urine: Secondary | ICD-10-CM | POA: Diagnosis not present

## 2021-01-30 DIAGNOSIS — D126 Benign neoplasm of colon, unspecified: Secondary | ICD-10-CM | POA: Diagnosis not present

## 2021-01-30 DIAGNOSIS — E785 Hyperlipidemia, unspecified: Secondary | ICD-10-CM | POA: Diagnosis not present

## 2021-01-30 DIAGNOSIS — N401 Enlarged prostate with lower urinary tract symptoms: Secondary | ICD-10-CM | POA: Diagnosis not present

## 2021-02-02 ENCOUNTER — Other Ambulatory Visit: Payer: Self-pay | Admitting: Gastroenterology

## 2021-02-02 DIAGNOSIS — Z86010 Personal history of colonic polyps: Secondary | ICD-10-CM

## 2021-02-15 DIAGNOSIS — R300 Dysuria: Secondary | ICD-10-CM | POA: Diagnosis not present

## 2021-02-22 ENCOUNTER — Ambulatory Visit (INDEPENDENT_AMBULATORY_CARE_PROVIDER_SITE_OTHER): Payer: Medicare Other | Admitting: Dermatology

## 2021-02-22 ENCOUNTER — Other Ambulatory Visit: Payer: Self-pay

## 2021-02-22 ENCOUNTER — Encounter: Payer: Self-pay | Admitting: Dermatology

## 2021-02-22 DIAGNOSIS — L729 Follicular cyst of the skin and subcutaneous tissue, unspecified: Secondary | ICD-10-CM | POA: Diagnosis not present

## 2021-02-22 DIAGNOSIS — Z1283 Encounter for screening for malignant neoplasm of skin: Secondary | ICD-10-CM | POA: Diagnosis not present

## 2021-02-22 NOTE — Patient Instructions (Signed)
2.2 cm cyst post shoulder [id number] $695 plus pathology bill from [facility], also needs 30 minute surgery   Over the counter DerMend lotion for bruise on arms

## 2021-02-23 DIAGNOSIS — Z20822 Contact with and (suspected) exposure to covid-19: Secondary | ICD-10-CM | POA: Diagnosis not present

## 2021-03-03 ENCOUNTER — Encounter: Payer: Self-pay | Admitting: Dermatology

## 2021-03-03 NOTE — Progress Notes (Signed)
   Follow-Up Visit   Subjective  Chad Moreno is a 80 y.o. male who presents for the following: Annual Exam (No personal history of atypical moles or skin cancer, no concerns at his yearly skin check today.).  General skin examination, cyst on back which is bothersome to patient Location:  Duration:  Quality:  Associated Signs/Symptoms: Modifying Factors:  Severity:  Timing: Context:   Objective  Well appearing patient in no apparent distress; mood and affect are within normal limits. General skin examination: No atypical pigmented lesions or nonmelanoma skin cancer.  Left Upper Back Full body skin check  2.2 cm cyst post shoulder  Skin tags under the arms  Acro keratoses back of the arms     A full examination was performed including scalp, head, eyes, ears, nose, lips, neck, chest, axillae, abdomen, back, buttocks, bilateral upper extremities, bilateral lower extremities, hands, feet, fingers, toes, fingernails, and toenails. All findings within normal limits unless otherwise noted below.   Assessment & Plan    Cyst of skin Left Upper Back  If chooses to have cyst surgically removed: 30 minute surgery patient aware to get confirmation from insurance   Encounter for screening for malignant neoplasm of skin  Annual skin examination, encouraged to self examine with wife twice annually.  Continue ultraviolet protection.      I, Lavonna Monarch, MD, have reviewed all documentation for this visit.  The documentation on 03/03/21 for the exam, diagnosis, procedures, and orders are all accurate and complete.

## 2021-03-07 ENCOUNTER — Inpatient Hospital Stay: Admission: RE | Admit: 2021-03-07 | Payer: Medicare Other | Source: Ambulatory Visit

## 2021-03-25 DIAGNOSIS — Z20822 Contact with and (suspected) exposure to covid-19: Secondary | ICD-10-CM | POA: Diagnosis not present

## 2021-03-27 ENCOUNTER — Other Ambulatory Visit: Payer: Medicare Other

## 2021-04-25 DIAGNOSIS — Z20822 Contact with and (suspected) exposure to covid-19: Secondary | ICD-10-CM | POA: Diagnosis not present

## 2021-04-28 DIAGNOSIS — Z23 Encounter for immunization: Secondary | ICD-10-CM | POA: Diagnosis not present

## 2021-05-10 DIAGNOSIS — N401 Enlarged prostate with lower urinary tract symptoms: Secondary | ICD-10-CM | POA: Diagnosis not present

## 2021-05-10 DIAGNOSIS — Z125 Encounter for screening for malignant neoplasm of prostate: Secondary | ICD-10-CM | POA: Diagnosis not present

## 2021-05-10 DIAGNOSIS — R7302 Impaired glucose tolerance (oral): Secondary | ICD-10-CM | POA: Diagnosis not present

## 2021-05-11 DIAGNOSIS — H25813 Combined forms of age-related cataract, bilateral: Secondary | ICD-10-CM | POA: Diagnosis not present

## 2021-05-11 DIAGNOSIS — H40053 Ocular hypertension, bilateral: Secondary | ICD-10-CM | POA: Diagnosis not present

## 2021-05-12 DIAGNOSIS — Z23 Encounter for immunization: Secondary | ICD-10-CM | POA: Diagnosis not present

## 2021-06-08 ENCOUNTER — Ambulatory Visit (INDEPENDENT_AMBULATORY_CARE_PROVIDER_SITE_OTHER): Payer: Medicare Other | Admitting: Dermatology

## 2021-06-08 ENCOUNTER — Encounter: Payer: Self-pay | Admitting: Dermatology

## 2021-06-08 ENCOUNTER — Other Ambulatory Visit: Payer: Self-pay

## 2021-06-08 DIAGNOSIS — L570 Actinic keratosis: Secondary | ICD-10-CM | POA: Diagnosis not present

## 2021-06-08 DIAGNOSIS — L720 Epidermal cyst: Secondary | ICD-10-CM | POA: Diagnosis not present

## 2021-06-08 NOTE — Patient Instructions (Signed)

## 2021-06-21 ENCOUNTER — Ambulatory Visit (INDEPENDENT_AMBULATORY_CARE_PROVIDER_SITE_OTHER): Payer: Medicare Other

## 2021-06-21 ENCOUNTER — Other Ambulatory Visit: Payer: Self-pay

## 2021-06-21 DIAGNOSIS — Z4802 Encounter for removal of sutures: Secondary | ICD-10-CM

## 2021-06-21 NOTE — Progress Notes (Signed)
Path to patient no signs or symptoms of infection x4 suture removed

## 2021-07-04 ENCOUNTER — Encounter: Payer: Self-pay | Admitting: Dermatology

## 2021-07-04 NOTE — Progress Notes (Signed)
° °  Follow-Up Visit   Subjective  Chad Moreno is a 81 y.o. male who presents for the following: Procedure (Here to have cyst removed from left post shoulder).  Requests removal of cyst upper back, also has new crusted area behind ear. Location:  Duration:  Quality:  Associated Signs/Symptoms: Modifying Factors:  Severity:  Timing: Context:   Objective  Well appearing patient in no apparent distress; mood and affect are within normal limits. Left Postauricular Area Gritty 6 mm pink crust   Left Upper Back Cyst identified by patient, nurse, and me.  2+ cm.  Patient requests removal.  Told preoperatively risks of unsightly scar, recurrence, noncoverage by health insurance.  He wishes to proceed.   4-0 VICRYL X 1 4-0 ETHILON X3    All skin waist up examined.   Assessment & Plan    Actinic keratosis Left Postauricular Area  Destruction of lesion - Left Postauricular Area Complexity: simple   Destruction method: cryotherapy   Informed consent: discussed and consent obtained   Lesion destroyed using liquid nitrogen: Yes   Cryotherapy cycles:  3 Outcome: patient tolerated procedure well with no complications   Post-procedure details: wound care instructions given    Epidermal cyst Left Upper Back  2.9 cm fusiform incision, dissected out to upper subcutaneous tissue, some fibrosis at superior margin, base visibly clear before layered closure.  Skin excision - Left Upper Back  Lesion length (cm):  2.9 Lesion width (cm):  2.5 Margin per side (cm):  0 Total excision diameter (cm):  2.9 Informed consent: discussed and consent obtained   Timeout: patient name, date of birth, surgical site, and procedure verified   Anesthesia: the lesion was anesthetized in a standard fashion   Anesthetic:  1% lidocaine w/ epinephrine 1-100,000 local infiltration Instrument used: #15 blade   Hemostasis achieved with: pressure   Outcome: patient tolerated procedure well with no  complications   Post-procedure details: sterile dressing applied and wound care instructions given   Dressing type: pressure dressing    Specimen 1 - Surgical pathology Differential Diagnosis: R/O CYST   Check Margins: No      I, Lavonna Monarch, MD, have reviewed all documentation for this visit.  The documentation on 07/04/21 for the exam, diagnosis, procedures, and orders are all accurate and complete.

## 2021-07-18 ENCOUNTER — Other Ambulatory Visit: Payer: Medicare Other

## 2021-07-20 ENCOUNTER — Ambulatory Visit
Admission: RE | Admit: 2021-07-20 | Discharge: 2021-07-20 | Disposition: A | Discharge status: Home / Self Care | Payer: Medicare Other | Source: Ambulatory Visit | Attending: Gastroenterology | Admitting: Gastroenterology

## 2021-07-20 DIAGNOSIS — K635 Polyp of colon: Secondary | ICD-10-CM | POA: Diagnosis not present

## 2021-07-20 DIAGNOSIS — K6389 Other specified diseases of intestine: Secondary | ICD-10-CM | POA: Diagnosis not present

## 2021-07-20 DIAGNOSIS — M47816 Spondylosis without myelopathy or radiculopathy, lumbar region: Secondary | ICD-10-CM | POA: Diagnosis not present

## 2021-07-20 DIAGNOSIS — Z86010 Personal history of colonic polyps: Secondary | ICD-10-CM

## 2021-07-20 DIAGNOSIS — K5730 Diverticulosis of large intestine without perforation or abscess without bleeding: Secondary | ICD-10-CM | POA: Diagnosis not present

## 2021-10-30 DIAGNOSIS — Z125 Encounter for screening for malignant neoplasm of prostate: Secondary | ICD-10-CM | POA: Diagnosis not present

## 2021-10-30 DIAGNOSIS — R972 Elevated prostate specific antigen [PSA]: Secondary | ICD-10-CM | POA: Diagnosis not present

## 2022-02-05 DIAGNOSIS — Z125 Encounter for screening for malignant neoplasm of prostate: Secondary | ICD-10-CM | POA: Diagnosis not present

## 2022-02-05 DIAGNOSIS — R7989 Other specified abnormal findings of blood chemistry: Secondary | ICD-10-CM | POA: Diagnosis not present

## 2022-02-05 DIAGNOSIS — I10 Essential (primary) hypertension: Secondary | ICD-10-CM | POA: Diagnosis not present

## 2022-02-05 DIAGNOSIS — E785 Hyperlipidemia, unspecified: Secondary | ICD-10-CM | POA: Diagnosis not present

## 2022-02-05 DIAGNOSIS — R7302 Impaired glucose tolerance (oral): Secondary | ICD-10-CM | POA: Diagnosis not present

## 2022-02-05 DIAGNOSIS — M109 Gout, unspecified: Secondary | ICD-10-CM | POA: Diagnosis not present

## 2022-02-12 DIAGNOSIS — M1990 Unspecified osteoarthritis, unspecified site: Secondary | ICD-10-CM | POA: Diagnosis not present

## 2022-02-12 DIAGNOSIS — R972 Elevated prostate specific antigen [PSA]: Secondary | ICD-10-CM | POA: Diagnosis not present

## 2022-02-12 DIAGNOSIS — R7302 Impaired glucose tolerance (oral): Secondary | ICD-10-CM | POA: Diagnosis not present

## 2022-02-12 DIAGNOSIS — R410 Disorientation, unspecified: Secondary | ICD-10-CM | POA: Diagnosis not present

## 2022-02-12 DIAGNOSIS — D126 Benign neoplasm of colon, unspecified: Secondary | ICD-10-CM | POA: Diagnosis not present

## 2022-02-12 DIAGNOSIS — N401 Enlarged prostate with lower urinary tract symptoms: Secondary | ICD-10-CM | POA: Diagnosis not present

## 2022-02-12 DIAGNOSIS — E785 Hyperlipidemia, unspecified: Secondary | ICD-10-CM | POA: Diagnosis not present

## 2022-02-12 DIAGNOSIS — Z23 Encounter for immunization: Secondary | ICD-10-CM | POA: Diagnosis not present

## 2022-02-12 DIAGNOSIS — Z0000 Encounter for general adult medical examination without abnormal findings: Secondary | ICD-10-CM | POA: Diagnosis not present

## 2022-02-12 DIAGNOSIS — I10 Essential (primary) hypertension: Secondary | ICD-10-CM | POA: Diagnosis not present

## 2022-02-12 DIAGNOSIS — R8281 Pyuria: Secondary | ICD-10-CM | POA: Diagnosis not present

## 2022-02-12 DIAGNOSIS — E669 Obesity, unspecified: Secondary | ICD-10-CM | POA: Diagnosis not present

## 2022-02-28 ENCOUNTER — Ambulatory Visit: Payer: Medicare Other | Admitting: Dermatology

## 2022-03-05 DIAGNOSIS — Z125 Encounter for screening for malignant neoplasm of prostate: Secondary | ICD-10-CM | POA: Diagnosis not present

## 2022-03-05 DIAGNOSIS — N401 Enlarged prostate with lower urinary tract symptoms: Secondary | ICD-10-CM | POA: Diagnosis not present

## 2022-03-05 DIAGNOSIS — R7989 Other specified abnormal findings of blood chemistry: Secondary | ICD-10-CM | POA: Diagnosis not present

## 2022-03-27 DIAGNOSIS — Z23 Encounter for immunization: Secondary | ICD-10-CM | POA: Diagnosis not present

## 2022-04-14 DIAGNOSIS — Z23 Encounter for immunization: Secondary | ICD-10-CM | POA: Diagnosis not present

## 2022-04-19 DIAGNOSIS — R972 Elevated prostate specific antigen [PSA]: Secondary | ICD-10-CM | POA: Diagnosis not present

## 2022-04-19 DIAGNOSIS — N401 Enlarged prostate with lower urinary tract symptoms: Secondary | ICD-10-CM | POA: Diagnosis not present

## 2022-05-02 ENCOUNTER — Other Ambulatory Visit: Payer: Self-pay | Admitting: Urology

## 2022-05-02 DIAGNOSIS — R972 Elevated prostate specific antigen [PSA]: Secondary | ICD-10-CM

## 2022-05-03 DIAGNOSIS — M545 Low back pain, unspecified: Secondary | ICD-10-CM | POA: Diagnosis not present

## 2022-05-03 DIAGNOSIS — M25552 Pain in left hip: Secondary | ICD-10-CM | POA: Diagnosis not present

## 2022-05-24 DIAGNOSIS — H53143 Visual discomfort, bilateral: Secondary | ICD-10-CM | POA: Diagnosis not present

## 2022-05-24 DIAGNOSIS — Q130 Coloboma of iris: Secondary | ICD-10-CM | POA: Diagnosis not present

## 2022-05-24 DIAGNOSIS — H3540 Unspecified peripheral retinal degeneration: Secondary | ICD-10-CM | POA: Diagnosis not present

## 2022-05-24 DIAGNOSIS — B399 Histoplasmosis, unspecified: Secondary | ICD-10-CM | POA: Diagnosis not present

## 2022-05-24 DIAGNOSIS — H25813 Combined forms of age-related cataract, bilateral: Secondary | ICD-10-CM | POA: Diagnosis not present

## 2022-05-29 ENCOUNTER — Ambulatory Visit
Admission: RE | Admit: 2022-05-29 | Discharge: 2022-05-29 | Disposition: A | Discharge status: Home / Self Care | Payer: Medicare Other | Source: Ambulatory Visit | Attending: Urology | Admitting: Urology

## 2022-05-29 DIAGNOSIS — R972 Elevated prostate specific antigen [PSA]: Secondary | ICD-10-CM

## 2022-05-29 MED ORDER — GADOPICLENOL 0.5 MMOL/ML IV SOLN
10.0000 mL | Freq: Once | INTRAVENOUS | Status: AC | PRN
  Administered 2022-05-29: 10 mL via INTRAVENOUS

## 2022-05-31 DIAGNOSIS — R972 Elevated prostate specific antigen [PSA]: Secondary | ICD-10-CM | POA: Diagnosis not present

## 2022-07-05 DIAGNOSIS — R972 Elevated prostate specific antigen [PSA]: Secondary | ICD-10-CM | POA: Diagnosis not present

## 2022-07-05 DIAGNOSIS — D075 Carcinoma in situ of prostate: Secondary | ICD-10-CM | POA: Diagnosis not present

## 2022-07-12 DIAGNOSIS — C61 Malignant neoplasm of prostate: Secondary | ICD-10-CM | POA: Diagnosis not present

## 2022-07-19 ENCOUNTER — Other Ambulatory Visit (HOSPITAL_COMMUNITY): Payer: Self-pay | Admitting: Urology

## 2022-07-19 DIAGNOSIS — C61 Malignant neoplasm of prostate: Secondary | ICD-10-CM

## 2022-07-19 NOTE — Progress Notes (Signed)
RN spoke with patient and introduced myself as the prostate nurse navigator.  Patient has been notified of upcoming PSMA PET scan on 2/2 and RadOnc consult on 2/6 @ 9:30am.  Direct number provided.

## 2022-07-23 ENCOUNTER — Telehealth: Payer: Self-pay | Admitting: Radiation Oncology

## 2022-07-23 NOTE — Telephone Encounter (Signed)
Left message with sooner appointment date for patient's upcoming appointment. Gave option for patient to call back.

## 2022-07-27 ENCOUNTER — Encounter (HOSPITAL_COMMUNITY)
Admission: RE | Admit: 2022-07-27 | Discharge: 2022-07-27 | Disposition: A | Discharge status: Home / Self Care | Payer: Medicare Other | Source: Ambulatory Visit | Attending: Urology | Admitting: Urology

## 2022-07-27 DIAGNOSIS — C61 Malignant neoplasm of prostate: Secondary | ICD-10-CM | POA: Insufficient documentation

## 2022-07-27 DIAGNOSIS — C7951 Secondary malignant neoplasm of bone: Secondary | ICD-10-CM | POA: Diagnosis not present

## 2022-07-27 DIAGNOSIS — R918 Other nonspecific abnormal finding of lung field: Secondary | ICD-10-CM | POA: Diagnosis not present

## 2022-07-27 DIAGNOSIS — M533 Sacrococcygeal disorders, not elsewhere classified: Secondary | ICD-10-CM | POA: Diagnosis not present

## 2022-07-27 MED ORDER — PIFLIFOLASTAT F 18 (PYLARIFY) INJECTION
9.0000 | Freq: Once | INTRAVENOUS | Status: AC
  Administered 2022-07-27: 8.8 via INTRAVENOUS

## 2022-07-30 NOTE — Progress Notes (Signed)
GU Location of Tumor / Histology: Prostate Ca  If Prostate Cancer, Gleason Score is (4 + 3) and PSA is (5.15 on 05/2022)  Biopsies     Past/Anticipated interventions by urology, if any: NA  Past/Anticipated interventions by medical oncology, if any: NA  Weight changes, if any: {:18581}  IPSS: SHIM:  Bowel/Bladder complaints, if any: {:18581}   Nausea/Vomiting, if any: {:18581}  Pain issues, if any:  {:18581}  SAFETY ISSUES: Prior radiation? {:18581} Pacemaker/ICD? {:18581} Possible current pregnancy? Male Is the patient on methotrexate? No  Current Complaints / other details:

## 2022-07-31 ENCOUNTER — Ambulatory Visit
Admission: RE | Admit: 2022-07-31 | Discharge: 2022-07-31 | Disposition: A | Discharge status: Home / Self Care | Payer: Medicare Other | Source: Ambulatory Visit | Attending: Radiation Oncology | Admitting: Radiation Oncology

## 2022-07-31 ENCOUNTER — Encounter: Payer: Self-pay | Admitting: Radiation Oncology

## 2022-07-31 ENCOUNTER — Other Ambulatory Visit: Payer: Self-pay

## 2022-07-31 VITALS — BP 180/70 | HR 79 | Temp 97.3°F | Resp 18 | Ht 73.0 in | Wt 235.1 lb

## 2022-07-31 DIAGNOSIS — Z7982 Long term (current) use of aspirin: Secondary | ICD-10-CM | POA: Diagnosis not present

## 2022-07-31 DIAGNOSIS — Z87891 Personal history of nicotine dependence: Secondary | ICD-10-CM | POA: Diagnosis not present

## 2022-07-31 DIAGNOSIS — C61 Malignant neoplasm of prostate: Secondary | ICD-10-CM | POA: Insufficient documentation

## 2022-07-31 DIAGNOSIS — Z79899 Other long term (current) drug therapy: Secondary | ICD-10-CM | POA: Insufficient documentation

## 2022-07-31 DIAGNOSIS — R918 Other nonspecific abnormal finding of lung field: Secondary | ICD-10-CM

## 2022-07-31 DIAGNOSIS — C7951 Secondary malignant neoplasm of bone: Secondary | ICD-10-CM | POA: Insufficient documentation

## 2022-07-31 DIAGNOSIS — Z191 Hormone sensitive malignancy status: Secondary | ICD-10-CM | POA: Diagnosis not present

## 2022-07-31 HISTORY — DX: Nummular dermatitis: L30.0

## 2022-07-31 HISTORY — DX: Regular astigmatism, right eye: H52.221

## 2022-07-31 HISTORY — DX: Hypermetropia, left eye: H52.02

## 2022-07-31 HISTORY — DX: Male erectile dysfunction, unspecified: N52.9

## 2022-07-31 HISTORY — DX: Elevated prostate specific antigen (PSA): R97.20

## 2022-07-31 HISTORY — DX: Pain in unspecified hip: M25.559

## 2022-07-31 HISTORY — DX: Epidermal cyst: L72.0

## 2022-07-31 HISTORY — DX: Encounter for screening for malignant neoplasm of prostate: Z12.5

## 2022-07-31 HISTORY — DX: Histoplasmosis, unspecified: B39.9

## 2022-07-31 HISTORY — DX: Gout, unspecified: M10.9

## 2022-07-31 HISTORY — DX: Palmar fascial fibromatosis (dupuytren): M72.0

## 2022-07-31 HISTORY — DX: Melanocytic nevi, unspecified: D22.9

## 2022-07-31 HISTORY — DX: Impaired glucose tolerance (oral): R73.02

## 2022-07-31 HISTORY — DX: Hyperlipidemia, unspecified: E78.5

## 2022-07-31 HISTORY — DX: Low back pain, unspecified: M54.50

## 2022-07-31 HISTORY — DX: Essential (primary) hypertension: I10

## 2022-07-31 HISTORY — DX: Ocular hypertension, unspecified eye: H40.059

## 2022-07-31 HISTORY — DX: Pain in unspecified joint: M25.50

## 2022-07-31 HISTORY — DX: Unspecified age-related cataract: H25.9

## 2022-07-31 HISTORY — DX: Age-related nuclear cataract, unspecified eye: H25.10

## 2022-07-31 HISTORY — DX: Pain in left shoulder: M25.512

## 2022-07-31 HISTORY — DX: Unspecified amblyopia, right eye: H53.001

## 2022-07-31 HISTORY — DX: Dysuria: R30.0

## 2022-07-31 HISTORY — DX: Family history of malignant neoplasm of prostate: Z80.42

## 2022-07-31 HISTORY — DX: Unspecified osteoarthritis, unspecified site: M19.90

## 2022-07-31 MED ORDER — SILODOSIN 4 MG PO CAPS: 4.0000 mg | ORAL_CAPSULE | Freq: Every day | ORAL | 3 refills | Status: DC

## 2022-07-31 NOTE — Progress Notes (Signed)
Introduced myself to the patient as the prostate nurse navigator.  I was able to provide him with billing contact information for his cancer policy as previously discussed on the phone.  He is here to discuss his radiation treatment options after completing his PSMA PET on 2/2.  I gave him my business card and asked him to call me with questions or concerns.  Verbalized understanding.

## 2022-07-31 NOTE — Progress Notes (Signed)
Radiation Oncology         [phone] ________________________________  Initial Outpatient Consultation  Name: Chad Moreno MRN: [id number]  Date: 07/31/2022  DOB: 1941/01/28  LO:VFIE, [name], MD  Chad Moreno*   REFERRING PHYSICIAN: Davis Moreno*  DIAGNOSIS: 82 y.o. gentleman with Stage [phone], oligometastatic adenocarcinoma of the prostate with Gleason score of 4+3, and PSA of 5.15.    ICD-10-CM   1. Malignant neoplasm of prostate ([place])  C61 NM PET Image Initial (PI) Skull Base To Thigh    2. Multiple subsolid lung nodules greater than 6 mm in diameter  R91.8 NM PET Image Initial (PI) Skull Base To Thigh      HISTORY OF PRESENT ILLNESS: Chad Moreno is a 82 y.o. male with a diagnosis of prostate cancer. His PSA was noted to be mildly elevated at 3.87 in 10/2021 on routine labs with his primary care physician, Dr. Dagmar Hait.  He was treated with a 2 week course of antibiotics for possible prostatitis and a repeat PSA in 01/2022 was further elevated at 5.28. Accordingly, he was referred for evaluation in urology by Dr. Lovena Neighbours on 04/19/22. A repeat PSA that same day was stable elevated at 4.64. He underwent prostate MRI on 05/29/22 showing a PI-RADS 5 lesion on the right in the peripheral zone; and a PI-RADS 4 and PI-RADS 3 lesion on the left in the peripheral zone. There was also sclerosis with some marginal enhancement anteriorly along the left SI joint, felt most likely to be a result of local sacroiliitis. A repeat PSA on 05/31/22 remained stable elevated at 5.15.  The patient proceeded to MRI fusion biopsy of the prostate on 07/05/22.  The prostate volume measured 56 cc.  Out of 22 core biopsies, 7 were positive.  The maximum Gleason score was 4+3, and this was seen in one of three sampled cores from MRI ROI #1 on the right. Additionally, Gleason 3+4 was seen in the right apex lateral, left base lateral, 1 of 3 cores from MRI ROI #2 (small focus, left), and 1 of 3  cores from the MRI ROI #3 (small focus, left), and Gleason 3+3 in the left apex lateral and right apex.  He underwent staging PSMA PET scan on 07/27/22 showing intense radiotracer activity within the prostate gland as well as radiotracer activity associated with sclerotic lesions on both sides of the left SI joint. The pattern favors a degenerative lesion but findings remain concerning for oligo metastasis.  Additionally, there were single pulmonary nodules  >1 cm, bilaterally, without radiotracer activity.  The patient reviewed the biopsy results with his urologist and he has kindly been referred today for discussion of potential radiation treatment options.   PREVIOUS RADIATION THERAPY: No  PAST MEDICAL HISTORY: No past medical history on file.    PAST SURGICAL HISTORY:No past surgical history on file.  FAMILY HISTORY: No family history on file.  SOCIAL HISTORY:  Social History   Socioeconomic History   Marital status: Married    Spouse name: Not on file   Number of children: Not on file   Years of education: Not on file   Highest education level: Not on file  Occupational History   Not on file  Tobacco Use   Smoking status: Never   Smokeless tobacco: Never  Vaping Use   Vaping Use: Never used  Substance and Sexual Activity   Alcohol use: Yes    Comment: rare   Drug use: Never   Sexual activity: Not  on file  Other Topics Concern   Not on file  Social History Narrative   Not on file   Social Determinants of Health   Financial Resource Strain: Not on file  Food Insecurity: No Food Insecurity (07/31/2022)   Hunger Vital Sign    Worried About Running Out of Food in the Last Year: Never true    Ran Out of Food in the Last Year: Never true  Transportation Needs: No Transportation Needs (07/31/2022)   PRAPARE - Hydrologist (Medical): No    Lack of Transportation (Non-Medical): No  Physical Activity: Not on file  Stress: Not on file  Social  Connections: Not on file  Intimate Partner Violence: Not At Risk (07/31/2022)   Humiliation, Afraid, Rape, and Kick questionnaire    Fear of Current or Ex-Partner: No    Emotionally Abused: No    Physically Abused: No    Sexually Abused: No    ALLERGIES: Sulfa antibiotics  MEDICATIONS:  Current Outpatient Medications  Medication Sig Dispense Refill   gabapentin (NEURONTIN) 100 MG capsule Take 100 mg by mouth as needed.     Probiotic Product (PROBIOTIC PEARLS ADVANTAGE PO) Take by mouth.     silodosin (RAPAFLO) 4 MG CAPS capsule Take 1 capsule (4 mg total) by mouth daily with supper. 60 capsule 3   amLODipine (NORVASC) 2.5 MG tablet Take 2.5 mg by mouth daily.     Ascorbic Acid (VITAMIN C PO) Take 1 tablet by mouth daily with breakfast.     aspirin EC 81 MG tablet Take 81 mg by mouth daily.     B Complex Vitamins (VITAMIN-B COMPLEX) TABS Take 1 tablet by mouth daily with breakfast.     Cholecalciferol (VITAMIN D3) 50 MCG (2000 UT) TABS Take 2,000 Units by mouth daily with breakfast.     losartan (COZAAR) 100 MG tablet Take 50 mg by mouth 2 (two) times daily.      meloxicam (MOBIC) 15 MG tablet Take 15 mg by mouth daily.      Omega-3 Fatty Acids (FISH OIL PO) Take 1 capsule by mouth daily with breakfast.     POTASSIUM PO Take 1 tablet by mouth daily with breakfast.     No current facility-administered medications for this encounter.    REVIEW OF SYSTEMS:  On review of systems, the patient reports that he is doing well overall. He denies any chest pain, shortness of breath, cough, fevers, chills, night sweats, unintended weight changes. He denies any bowel disturbances, and denies abdominal pain, nausea or vomiting. He denies any new musculoskeletal or joint aches or pains. His IPSS was 18, indicating moderate urinary symptoms. His SHIM was 16, indicating he has mild-moderate erectile dysfunction. A complete review of systems is obtained and is otherwise negative.  PHYSICAL EXAM:  Wt  Readings from Last 3 Encounters:  07/31/22 235 lb 2 oz (106.7 kg)  03/11/19 230 lb (104.3 kg)   Temp Readings from Last 3 Encounters:  07/31/22 (!) 97.3 F (36.3 C) (Temporal)  03/11/19 98.4 F (36.9 C) (Oral)   BP Readings from Last 3 Encounters:  07/31/22 (!) 180/70  03/12/19 (!) 145/84   Pulse Readings from Last 3 Encounters:  07/31/22 79  03/12/19 63   Pain Assessment Pain Score: 0-No pain/10  In general this is a well appearing Caucasian male in no acute distress. He appears much younger than his stated age and remains very active with an excellent performance status. He's alert and oriented  x4 and appropriate throughout the examination. Cardiopulmonary assessment is negative for acute distress, and he exhibits normal effort.     KPS = 100  100 - Normal; no complaints; no evidence of disease. 90   - Able to carry on normal activity; minor signs or symptoms of disease. 80   - Normal activity with effort; some signs or symptoms of disease. 38   - Cares for self; unable to carry on normal activity or to do active work. 60   - Requires occasional assistance, but is able to care for most of his personal needs. 50   - Requires considerable assistance and frequent medical care. 58   - Disabled; requires special care and assistance. 52   - Severely disabled; hospital admission is indicated although death not imminent. 14   - Very sick; hospital admission necessary; active supportive treatment necessary. 10   - Moribund; fatal processes progressing rapidly. 0     - Dead  Karnofsky DA, Abelmann [place], Craver LS and Burchenal [facility] [phone]) The use of the nitrogen mustards in the palliative treatment of carcinoma: with particular reference to bronchogenic carcinoma Cancer 1 634-56  LABORATORY DATA:  Lab Results  Component Value Date   WBC 7.4 03/11/2019   HGB 13.6 03/11/2019   HCT 40.0 03/11/2019   MCV 90.8 03/11/2019   PLT 181 03/11/2019   Lab Results  Component Value Date   NA  139 03/11/2019   K 4.4 03/11/2019   CL 105 03/11/2019   CO2 23 03/11/2019   Lab Results  Component Value Date   ALT 29 03/11/2019   AST 29 03/11/2019   ALKPHOS 86 03/11/2019   BILITOT 1.0 03/11/2019     RADIOGRAPHY: NM PET (PSMA) SKULL TO MID THIGH  Result Date: 07/28/2022 CLINICAL DATA:  Prostate carcinoma with biochemical recurrence. EXAM: NUCLEAR MEDICINE PET SKULL BASE TO THIGH TECHNIQUE: 8.8 mCi F18 Piflufolastat (Pylarify) was injected intravenously. Full-ring PET imaging was performed from the skull base to thigh after the radiotracer. CT data was obtained and used for attenuation correction and anatomic localization. COMPARISON:  None Available. FINDINGS: NECK No radiotracer activity in neck lymph nodes. Incidental CT finding: None. CHEST No radiotracer accumulation within mediastinal or hilar lymph nodes. 10 mm nodule in the LEFT upper lobe (image 87/series 4) does not have significant radiotracer activity. 12 mm RIGHT lower lobe pulmonary nodule (image 97/CT series 4) also not have significant metabolic activity radiotracer activity. No enlarged mediastinal lymph nodes. Incidental CT finding: None. ABDOMEN/PELVIS Prostate: There foci of intense radiotracer activity within the LEFT and RIGHT lobe of the prostate gland with SUV max equal 12. (Image 288) Lymph nodes: No abnormal radiotracer accumulation within pelvic or abdominal nodes. Liver: No evidence of liver metastasis. Incidental CT finding: None. SKELETON There is radiotracer activity associated with the LEFT sacral ala along the SI joint. Activity is moderate with SUV max equal 5.5. There is sclerosis on either side of the joint. The sclerotic lesion in the iliac bone adjacent to the SI joint has mild radiotracer activity with SUV max equal 3.2. These lesions enhance on comparison MRI. The sclerosis in the LEFT sacral ala is new from CT 07/20/2021. There is additional sclerotic lesion in the LEFT iliac wing measuring 6 mm (is 203)  without radiotracer activity. This lesion is also present on comparison CT IMPRESSION: 1. Intense radiotracer activity within the prostate gland consistent primary prostate adenocarcinoma. 2. Radiotracer activity associated with sclerotic lesions on both sides of the LEFT SI joint.  Pattern favors degenerative lesion however lesions are advanced from CT 07/20/21 and enhance on comparison MRI. Findings remain concerning for prostate cancer metastasis. Recommend correlation with PSA level and consider tissue sampling if relevant for management. 3. Bilateral single greater than 1 cm pulmonary nodules . No radiotracer activity is inconsistent with prostate cancer metastasis. However, cannot exclude other neoplasms other than prostate cancer. Consider FDG PET scan for further evaluation. Electronically Signed   By: Suzy Bouchard M.D.   On: 07/28/2022 13:41      IMPRESSION/PLAN: 1. 82 y.o. 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 We discussed the patient's workup and outlined the nature of prostate cancer in this setting. The patient's T stage, Gleason's score, and PSA put him into the unfavorable intermediate risk group but the disease staging PSMA PET scan indicates some activity in the left SI joint, likely oligometastatic disease. We discussed bone biopsy of this area to be the only definitive way to confirm whether this is a metastatic deposit versus degenerative changes only but he would prefer to avoid having that procedure and just treat empirically, if possible. Accordingly, he is eligible for ADT concurrent with daily external beam radiation to the prostate and pelvic lymph nodes while simultaneously treating the left SI joint. We discussed the available radiation techniques, and focused on the details and logistics of delivery. He is not an ideal candidate for brachytherapy boost given his level of urinary symptoms associated  with his enlarged prostate and he is not an ideal surgical candidate given his advanced age. Therefore, we discussed and outlined the risks, benefits, short and long-term effects associated with daily external beam radiotherapy and compared and contrasted these with prostatectomy. We discussed the role of SpaceOAR gel in reducing the rectal toxicity associated with radiotherapy. He appears to have a good understanding of his disease and our treatment recommendations which are of curative intent.  He was encouraged to ask questions that were answered to his stated satisfaction.  At the conclusion of our conversation, the patient is interested in moving forward with 8 weeks of external beam therapy to the prostate, pelvic lymph nodes and left SI joint, concurrent with ADT. We will share our discussion with Dr. Lovena Neighbours and make arrangements for a follow up visit, first available, to start ADT now.  We will also coordinate for fiducial markers and SpaceOAR gel placement in April 2024, prior to simulation, to reduce rectal toxicity from radiotherapy. The patient appears to have a good understanding of his disease and our treatment recommendations which are of curative intent and is in agreement with the stated plan.  Therefore, we will move forward with treatment planning accordingly, in anticipation of beginning IMRT approximately 2 months after starting ADT.  2. Bilateral pulmonary nodules. On his recent PSMA PET scan for disease staging of his prostate cancer, he was incidentally found to have a 10 mm LUL and a 12 mm RLL pulmonary nodule of unknown significance. These were not active on PSMA PET so they do not appear to be associated with the prostate cancer but can not rule out the possibility of early stage lung cancers. He does have a history of smoking but quit in 1981. We discussed further evaluation of these nodules with FDG PET scan and if there is activity in one or both, we would then refer to pulmonology  for consideration of bronchoscopy for tissue confirmation. If there is no activity on the FDG PET  scan, we would plan to monitor with a follow up CT Chest scan in June 2024 to confirm stability versus progression in size to determine the need for any additional work up. He appears to have a good understanding of these recommendations and is in agreement to proceed with FDG PET imaging. We will plan to contact him by phone with those results as soon as they are available and discuss any further recommendations at that time. We will share this information with his PCP, Dr. Dagmar Hait and his urologist, Dr. Lovena Neighbours.  We personally spent 70 minutes in this encounter including chart review, reviewing radiological studies, meeting face-to-face with the patient, entering orders and completing documentation.    Nicholos Johns, PA-C    Tyler Pita, MD  [facility] Direct Dial: [phone]  Fax: [phone] [email or web address]  Skype  [email or web address]   This document serves as a record of services personally performed by Tyler Pita, MD and Freeman Caldron, PA-C. It was created on their behalf by Wilburn Mylar, a trained medical scribe. The creation of this record is based on the scribe's personal observations and the provider's statements to them. This document has been checked and approved by the attending provider.

## 2022-08-01 ENCOUNTER — Telehealth: Payer: Self-pay | Admitting: *Deleted

## 2022-08-01 NOTE — Telephone Encounter (Signed)
CALLED PATIENT TO INFORM OF ADT APPT. ON 08-24-22- ARRIVAL TIME- 8:15 AM @ DR. Jackson Latino OFFICE AT [facility] AND HIS PET SCAN FOR 08-16-22- ARRIVAL TIME- 10:50 AM @ [facility], PATIENT TO HAVE WATER ONLY- 6 HRS. PRIOR TO TEST, PATIENT TO AVOID CARBS THE DAY PRIOR TO SCAN, LVM FOR A RETURN CALL

## 2022-08-06 NOTE — Progress Notes (Signed)
Pt is now scheduled @ [facility] on 2/14 with Dr. Lovena Neighbours to start hormonal therapy.  Patient is aware of change.  Pt with questions regarding ADT and intentional delay in starting radiation.  RN provided education on how ADT works, and reasoning for start approximately 2 months later.   Pt verbalized understanding.    Plan of care in progress.

## 2022-08-08 DIAGNOSIS — C61 Malignant neoplasm of prostate: Secondary | ICD-10-CM | POA: Diagnosis not present

## 2022-08-16 ENCOUNTER — Encounter (HOSPITAL_COMMUNITY)
Admission: RE | Admit: 2022-08-16 | Discharge: 2022-08-16 | Disposition: A | Discharge status: Home / Self Care | Payer: Medicare Other | Source: Ambulatory Visit | Attending: Urology | Admitting: Urology

## 2022-08-16 DIAGNOSIS — R918 Other nonspecific abnormal finding of lung field: Secondary | ICD-10-CM | POA: Diagnosis not present

## 2022-08-16 DIAGNOSIS — C61 Malignant neoplasm of prostate: Secondary | ICD-10-CM | POA: Diagnosis not present

## 2022-08-16 LAB — GLUCOSE, CAPILLARY: Glucose-Capillary: 125 mg/dL — ABNORMAL HIGH (ref 70–99)

## 2022-08-16 MED ORDER — FLUDEOXYGLUCOSE F - 18 (FDG) INJECTION
11.8000 | Freq: Once | INTRAVENOUS | Status: AC
  Administered 2022-08-16: 11.6 via INTRAVENOUS

## 2022-08-17 ENCOUNTER — Other Ambulatory Visit: Payer: Self-pay | Admitting: Emergency Medicine

## 2022-08-17 ENCOUNTER — Telehealth: Payer: Self-pay | Admitting: Urology

## 2022-08-17 NOTE — Telephone Encounter (Signed)
I called and spoke with the patient by telephone today to review the results of his recent PET scan that was performed on 08/16/2022 for further evaluation of bilateral lung nodules that were incidentally found at the time of his recent PSMA PET scan for disease staging of his newly diagnosed prostate cancer.  The nodules were not tracer avid on the PSMA PET scan so we do not feel like these are likely related to the prostate cancer but did feel like they warranted further evaluation.  On this recent FDG PET scan, the right lower lobe and left upper lobe pulmonary nodules are hypermetabolic, concerning for primary represent carcinoma.  Fortunately there is no metastatic lymphadenopathy or other concerning lesions.  There was hypermetabolic activity at the left SI joint that was tracer avid on his PSMA PET scan as well and therefore consistent with oligometastatic prostate cancer.  We reviewed these findings by phone today as well as the recommendation for further evaluation of the pulmonary nodules for tissue confirmation with bronchoscopic biopsy.  We will make a referral to Dr. Londell Moh for consultation and consideration for biopsy.  The patient did have a 30-monthEligard injection on 08/08/2022, in anticipation of beginning concurrent radiation to the prostate, pelvic nodes and oligometastatic lesion in the left SI joint in April 2024, approximately 2 months from starting the ADT.  However, we did discuss that pending the results of the lung nodule biopsy, if we need to delay the start of his radiation treatments for his prostate cancer, we can safely do so since he is protected on ADT.  We will await the results of tissue biopsy and treatment recommendations for the lung cancer and make adjustments to the prostate cancer treatment plan as needed.  The patient appears to have a good understanding of his disease and our recommendations and is in agreement with the stated plan.  We will stay in close communication  with pulmonology and I will also share this information with his PCP and urologist to keep everyone in the loop.  ANicholos Johns MMS, PA-C [facility] [place] [phone] Fax: [phone]

## 2022-08-20 ENCOUNTER — Telehealth: Payer: Self-pay

## 2022-08-20 ENCOUNTER — Other Ambulatory Visit: Payer: Self-pay | Admitting: Urology

## 2022-08-20 NOTE — Telephone Encounter (Signed)
ATC pt again left voicemail

## 2022-08-20 NOTE — Telephone Encounter (Signed)
Collene Gobble, MD  Dierdre Highman, RN Kandis Nab - need to get this person seen asap in a nodule or blocked slot. Also need to get him a superD Ct chest so I can plan a nav.  If my schedule is booked, I could consider just telling him the plan and trying to get the CT done in prep for bronch on 3/4  Thanks, RB  ATC LVMTCB x 1. Front staff if pt calls back please schedule in any blocked nodule slot available (30 min). Thank you

## 2022-08-21 ENCOUNTER — Telehealth: Payer: Self-pay | Admitting: Emergency Medicine

## 2022-08-21 NOTE — Progress Notes (Signed)
RN spoke with staff @ Dr. Agustina Caroli office to review getting patient in earlier than scheduled appointment on 09/26/2022.    Request sent for review and changes.  RN provided patient update that we are working to have appointment expedited.  Will continue to follow.

## 2022-08-21 NOTE — Progress Notes (Signed)
RN spoke with patient to update that pulmonology has been trying to contact to set up appointment.   Pt given contact number of Dr. Agustina Caroli office, pt will call this morning.  Pt aware that I will continue to follow to assess plan of care regarding lung nodules and keep him up to date on any changes that may require to prostate treatment plan.  Verbalized understanding.

## 2022-08-21 NOTE — Telephone Encounter (Signed)
Kathlee Nations at [facility] center calling. She was wondering if we can get this PT in sooner than 4/3 based on his diag. Dr. Lamonte Sakai is familiar with him and may agree to her request if asked. Thanks.  Kathlee Nations will watch Epic for a change and asumes we will call PT as needed.

## 2022-08-22 ENCOUNTER — Telehealth: Payer: Self-pay

## 2022-08-22 ENCOUNTER — Other Ambulatory Visit: Payer: Self-pay | Admitting: Emergency Medicine

## 2022-08-22 DIAGNOSIS — R918 Other nonspecific abnormal finding of lung field: Secondary | ICD-10-CM

## 2022-08-22 NOTE — Telephone Encounter (Signed)
Pt scheduled for 2pm slot 2/29. Nothing further needed.

## 2022-08-22 NOTE — Telephone Encounter (Signed)
ATC pt left detailed message to get pt scheduled into either 2 pm nodule slot today or nodule slot tomorrow. Front staff if pt calls back please place in nodule slot today or tomorrow. Thank you

## 2022-08-22 NOTE — Telephone Encounter (Signed)
Pt has been rescheduled with Dr. Lamonte Sakai on 02/29. Will close encounter

## 2022-08-23 ENCOUNTER — Encounter: Payer: Self-pay | Admitting: Emergency Medicine

## 2022-08-23 ENCOUNTER — Ambulatory Visit (INDEPENDENT_AMBULATORY_CARE_PROVIDER_SITE_OTHER): Payer: Medicare Other | Admitting: Emergency Medicine

## 2022-08-23 VITALS — BP 156/84 | HR 84 | Temp 98.2°F | Ht 73.0 in | Wt 239.0 lb

## 2022-08-23 DIAGNOSIS — R918 Other nonspecific abnormal finding of lung field: Secondary | ICD-10-CM

## 2022-08-23 NOTE — Progress Notes (Signed)
Subjective:    Patient ID: Chad Moreno, male    DOB: [birth year], 82 y.o.   MRN: [id number]  HPI 82 year old gentleman, former minimal tobacco history (5 pack years), hypertension, prostate cancer that has been treated by .  He has been followed in radiation oncology and underwent standard PET scan 08/16/2022 as below.  He is here today to discuss pulmonary nodular disease noted on his imaging. Well appearing. Does not have any respiratory complaints.   PET scan performed 08/16/2022 reviewed by me, shows right lower lobe and left upper lobe pulmonary nodules that were initially identified on a PSMA PET scan.  They were negative on the PSMA but are hypermetabolic on the FDG PET.   Review of Systems As per HPI  Past Medical History:  Diagnosis Date   Age-related cataract of left eye    Amblyopia, right eye    Arthritis    Dysuria    ED (erectile dysfunction)    Elevated PSA    Epidermal cyst    Family history of prostate cancer    Gout    Hip pain    Histoplasmosis    Hyperlipidemia    Hypermetropia, left eye    Hypertension    Impaired glucose tolerance    Joint pain    Left shoulder pain    Lower back pain    Melanocytic nevi, unspecified    Nummular dermatitis    Ocular hypertension    Palmar fascial fibromatosis    Prostate cancer screening    Regular astigmatism, right eye    Senile nuclear sclerosis      Family History  Problem Relation Age of Onset   Prostate cancer Father      Social History   Socioeconomic History   Marital status: Married    Spouse name: Not on file   Number of children: Not on file   Years of education: Not on file   Highest education level: Not on file  Occupational History   Not on file  Tobacco Use   Smoking status: Former    Packs/day: 0.50    Years: 10.00    Total pack years: 5.00    Types: Cigarettes    Quit date: 73    Years since quitting: 43.1   Smokeless tobacco: Never  Vaping Use   Vaping Use: Never used   Substance and Sexual Activity   Alcohol use: Yes    Comment: rare   Drug use: Never   Sexual activity: Not on file  Other Topics Concern   Not on file  Social History Narrative   Not on file   Social Determinants of Health   Financial Resource Strain: Not on file  Food Insecurity: No Food Insecurity (07/31/2022)   Hunger Vital Sign    Worried About Running Out of Food in the Last Year: Never true    Ran Out of Food in the Last Year: Never true  Transportation Needs: No Transportation Needs (07/31/2022)   PRAPARE - Hydrologist (Medical): No    Lack of Transportation (Non-Medical): No  Physical Activity: Not on file  Stress: Not on file  Social Connections: Not on file  Intimate Partner Violence: Not At Risk (07/31/2022)   Humiliation, Afraid, Rape, and Kick questionnaire    Fear of Current or Ex-Partner: No    Emotionally Abused: No    Physically Abused: No    Sexually Abused: No     Allergies  Allergen Reactions  Sulfa Antibiotics Anaphylaxis     Outpatient Medications Prior to Visit  Medication Sig Dispense Refill   amLODipine (NORVASC) 2.5 MG tablet Take 2.5 mg by mouth daily.     Ascorbic Acid (VITAMIN C PO) Take 1 tablet by mouth daily with breakfast.     aspirin EC 81 MG tablet Take 81 mg by mouth daily.     B Complex Vitamins (VITAMIN-B COMPLEX) TABS Take 1 tablet by mouth daily with breakfast.     Cholecalciferol (VITAMIN D3) 50 MCG (2000 UT) TABS Take 2,000 Units by mouth daily with breakfast.     gabapentin (NEURONTIN) 100 MG capsule Take 100 mg by mouth as needed.     losartan (COZAAR) 100 MG tablet Take 50 mg by mouth 2 (two) times daily.      meloxicam (MOBIC) 15 MG tablet Take 15 mg by mouth daily.      Omega-3 Fatty Acids (FISH OIL PO) Take 1 capsule by mouth daily with breakfast.     POTASSIUM PO Take 1 tablet by mouth daily with breakfast.     Probiotic Product (PROBIOTIC PEARLS ADVANTAGE PO) Take by mouth.     silodosin  (RAPAFLO) 4 MG CAPS capsule Take 1 capsule (4 mg total) by mouth daily with supper. 60 capsule 3   No facility-administered medications prior to visit.         Objective:   Physical Exam  Vitals:   08/23/22 1358  BP: (!) 156/84  Pulse: 84  Temp: 98.2 F (36.8 C)  TempSrc: Oral  SpO2: 98%  Weight: 239 lb (108.4 kg)  Height: '6\' 1"'$  (1.854 m)   Gen: Pleasant, well-nourished, in no distress,  normal affect  ENT: No lesions,  mouth clear,  oropharynx clear, no postnasal drip  Neck: No JVD, no stridor  Lungs: No use of accessory muscles, no crackles or wheezing on normal respiration, no wheeze on forced expiration  Cardiovascular: RRR, heart sounds normal, no murmur or gallops, no peripheral edema  Musculoskeletal: No deformities, no cyanosis or clubbing  Neuro: alert, awake, non focal  Skin: Warm, no lesions or rash     Assessment & Plan:  Pulmonary nodules We reviewed your PET scan today. We will arrange for navigational bronchoscopy to evaluate pulmonary nodules.  This will be done as an outpatient under general anesthesia at [facility] endoscopy.  You will need a designated driver.  Please stop your aspirin 2 days prior.  We we will work on getting this scheduled for 08/27/2022.  You will have a COVID test today. We will perform a CT scan of the chest on 08/24/2022 as planned Follow Dr. Lamonte Sakai in 1 month or next available.  Follow with radiation oncology as planned   Baltazar Apo, MD, PhD 08/23/2022, 4:48 PM [facility] [phone] or if no answer before 7:00PM call [phone] For any issues after 7:00PM please call eLink [phone]

## 2022-08-23 NOTE — Patient Instructions (Signed)
We reviewed your PET scan today. We will arrange for navigational bronchoscopy to evaluate pulmonary nodules.  This will be done as an outpatient under general anesthesia at [facility] endoscopy.  You will need a designated driver.  Please stop your aspirin 2 days prior.  We we will work on getting this scheduled for 08/27/2022.  You will have a COVID test today. We will perform a CT scan of the chest on 08/24/2022 as planned Follow Dr. Lamonte Sakai in 1 month or next available.  Follow with radiation oncology as planned

## 2022-08-23 NOTE — Assessment & Plan Note (Signed)
We reviewed your PET scan today. We will arrange for navigational bronchoscopy to evaluate pulmonary nodules.  This will be done as an outpatient under general anesthesia at [facility] endoscopy.  You will need a designated driver.  Please stop your aspirin 2 days prior.  We we will work on getting this scheduled for 08/27/2022.  You will have a COVID test today. We will perform a CT scan of the chest on 08/24/2022 as planned Follow Dr. Lamonte Sakai in 1 month or next available.  Follow with radiation oncology as planned

## 2022-08-23 NOTE — H&P (View-Only) (Signed)
Subjective:    Patient ID: Chad Moreno, male    DOB: 08-16-1940, 82 y.o.   MRN: [id number]  HPI 82 year old gentleman, former minimal tobacco history (5 pack years), hypertension, prostate cancer that has been treated by .  He has been followed in radiation oncology and underwent standard PET scan 08/16/2022 as below.  He is here today to discuss pulmonary nodular disease noted on his imaging. Well appearing. Does not have any respiratory complaints.   PET scan performed 08/16/2022 reviewed by me, shows right lower lobe and left upper lobe pulmonary nodules that were initially identified on a PSMA PET scan.  They were negative on the PSMA but are hypermetabolic on the FDG PET.   Review of Systems As per HPI  Past Medical History:  Diagnosis Date   Age-related cataract of left eye    Amblyopia, right eye    Arthritis    Dysuria    ED (erectile dysfunction)    Elevated PSA    Epidermal cyst    Family history of prostate cancer    Gout    Hip pain    Histoplasmosis    Hyperlipidemia    Hypermetropia, left eye    Hypertension    Impaired glucose tolerance    Joint pain    Left shoulder pain    Lower back pain    Melanocytic nevi, unspecified    Nummular dermatitis    Ocular hypertension    Palmar fascial fibromatosis    Prostate cancer screening    Regular astigmatism, right eye    Senile nuclear sclerosis      Family History  Problem Relation Age of Onset   Prostate cancer Father      Social History   Socioeconomic History   Marital status: Married    Spouse name: Not on file   Number of children: Not on file   Years of education: Not on file   Highest education level: Not on file  Occupational History   Not on file  Tobacco Use   Smoking status: Former    Packs/day: 0.50    Years: 10.00    Total pack years: 5.00    Types: Cigarettes    Quit date: 42    Years since quitting: 43.1   Smokeless tobacco: Never  Vaping Use   Vaping Use: Never used   Substance and Sexual Activity   Alcohol use: Yes    Comment: rare   Drug use: Never   Sexual activity: Not on file  Other Topics Concern   Not on file  Social History Narrative   Not on file   Social Determinants of Health   Financial Resource Strain: Not on file  Food Insecurity: No Food Insecurity (07/31/2022)   Hunger Vital Sign    Worried About Running Out of Food in the Last Year: Never true    Ran Out of Food in the Last Year: Never true  Transportation Needs: No Transportation Needs (07/31/2022)   PRAPARE - Hydrologist (Medical): No    Lack of Transportation (Non-Medical): No  Physical Activity: Not on file  Stress: Not on file  Social Connections: Not on file  Intimate Partner Violence: Not At Risk (07/31/2022)   Humiliation, Afraid, Rape, and Kick questionnaire    Fear of Current or Ex-Partner: No    Emotionally Abused: No    Physically Abused: No    Sexually Abused: No     Allergies  Allergen Reactions  Sulfa Antibiotics Anaphylaxis     Outpatient Medications Prior to Visit  Medication Sig Dispense Refill   amLODipine (NORVASC) 2.5 MG tablet Take 2.5 mg by mouth daily.     Ascorbic Acid (VITAMIN C PO) Take 1 tablet by mouth daily with breakfast.     aspirin EC 81 MG tablet Take 81 mg by mouth daily.     B Complex Vitamins (VITAMIN-B COMPLEX) TABS Take 1 tablet by mouth daily with breakfast.     Cholecalciferol (VITAMIN D3) 50 MCG (2000 UT) TABS Take 2,000 Units by mouth daily with breakfast.     gabapentin (NEURONTIN) 100 MG capsule Take 100 mg by mouth as needed.     losartan (COZAAR) 100 MG tablet Take 50 mg by mouth 2 (two) times daily.      meloxicam (MOBIC) 15 MG tablet Take 15 mg by mouth daily.      Omega-3 Fatty Acids (FISH OIL PO) Take 1 capsule by mouth daily with breakfast.     POTASSIUM PO Take 1 tablet by mouth daily with breakfast.     Probiotic Product (PROBIOTIC PEARLS ADVANTAGE PO) Take by mouth.     silodosin  (RAPAFLO) 4 MG CAPS capsule Take 1 capsule (4 mg total) by mouth daily with supper. 60 capsule 3   No facility-administered medications prior to visit.         Objective:   Physical Exam  Vitals:   08/23/22 1358  BP: (!) 156/84  Pulse: 84  Temp: 98.2 F (36.8 C)  TempSrc: Oral  SpO2: 98%  Weight: 239 lb (108.4 kg)  Height: '6\' 1"'$  (1.854 m)   Gen: Pleasant, well-nourished, in no distress,  normal affect  ENT: No lesions,  mouth clear,  oropharynx clear, no postnasal drip  Neck: No JVD, no stridor  Lungs: No use of accessory muscles, no crackles or wheezing on normal respiration, no wheeze on forced expiration  Cardiovascular: RRR, heart sounds normal, no murmur or gallops, no peripheral edema  Musculoskeletal: No deformities, no cyanosis or clubbing  Neuro: alert, awake, non focal  Skin: Warm, no lesions or rash     Assessment & Plan:  Pulmonary nodules We reviewed your PET scan today. We will arrange for navigational bronchoscopy to evaluate pulmonary nodules.  This will be done as an outpatient under general anesthesia at [facility] endoscopy.  You will need a designated driver.  Please stop your aspirin 2 days prior.  We we will work on getting this scheduled for 08/27/2022.  You will have a COVID test today. We will perform a CT scan of the chest on 08/24/2022 as planned Follow Dr. Lamonte Sakai in 1 month or next available.  Follow with radiation oncology as planned   Baltazar Apo, MD, PhD 08/23/2022, 4:48 PM  [facility] [phone] or if no answer before 7:00PM call [phone] For any issues after 7:00PM please call eLink [phone]

## 2022-08-24 ENCOUNTER — Other Ambulatory Visit: Payer: Self-pay

## 2022-08-24 ENCOUNTER — Ambulatory Visit (HOSPITAL_BASED_OUTPATIENT_CLINIC_OR_DEPARTMENT_OTHER)
Admission: RE | Admit: 2022-08-24 | Discharge: 2022-08-24 | Disposition: A | Discharge status: Home / Self Care | Payer: Medicare Other | Source: Ambulatory Visit | Attending: Emergency Medicine | Admitting: Emergency Medicine

## 2022-08-24 ENCOUNTER — Encounter (HOSPITAL_COMMUNITY): Payer: Self-pay | Admitting: Emergency Medicine

## 2022-08-24 DIAGNOSIS — R918 Other nonspecific abnormal finding of lung field: Secondary | ICD-10-CM | POA: Diagnosis not present

## 2022-08-24 NOTE — Progress Notes (Addendum)
PCP - [facility] Cardiologist - denies  PPM/ICD - Denies  Chest x-ray - unknown EKG - Will obtain DOS Stress Test - unknown ECHO - unknown Cardiac Cath - unknown  Sleep apnea/CPAP - denies  Non-diabetic  Blood Thinner Instructions: Denies Aspirin Instructions: Follow surgeon's instructions re: when to stop  ERAS Protcol - No, NPO  COVID TEST- Dr. Agustina Caroli office 08/23/2022. Awaiting results  Anesthesia review: No  Patient verbally denies any shortness of breath, fever, cough and chest pain during phone call   -------------  SDW INSTRUCTIONS given:  Your procedure is scheduled on Monday, August 27, 2022  Report to [facility] Main Entrance "A" at 8:30A.M., and check in at the Admitting office.  Call this number if you have problems the morning of surgery:  [phone]   Remember:  Do not eat or drink after midnight the night before your surgery   Take these medicines the morning of surgery with A SIP OF WATER  Amlodipine. As needed Neurontin  Follow your surgeon's instructions on when to stop Aspirin.  If no instructions were given by your surgeon then you will need to call the office to get those instructions.     As of today, STOP taking any Aleve, Naproxen, Ibuprofen, Motrin, Advil, Goody's, BC's, all herbal medications, fish oil, and all vitamins.                                 Do not wear lotions, powders, colognes, or deodorant. Men may shave face and neck.            Do not bring valuables to the hospital.             [facility] is not responsible for any belongings or valuables.  Do NOT Smoke (Tobacco/Vaping) 24 hours prior to your procedure  If you use a CPAP at night, you may bring all equipment for your overnight stay.   Contacts, glasses, dentures or bridgework may not be worn into surgery.      For patients admitted to the hospital, discharge time will be determined by your treatment team.   Patients discharged the day of surgery will  not be allowed to drive home, and someone needs to stay with them for 24 hours.    Special instructions:   [place]- Preparing For Surgery  Before surgery, you can play an important role. Because skin is not sterile, your skin needs to be as free of germs as possible. You can reduce the number of germs on your skin by washing with Dial Soap  before surgery.    Oral Hygiene is also important to reduce your risk of infection.  Remember - BRUSH YOUR TEETH THE MORNING OF SURGERY WITH YOUR REGULAR TOOTHPASTE  Please follow these instructions carefully.   Shower the NIGHT BEFORE SURGERY and the MORNING OF SURGERY with DIAL Soap.   Pat yourself dry with a CLEAN TOWEL.  Wear CLEAN PAJAMAS to bed the night before surgery  Place CLEAN SHEETS on your bed the night of your first shower and DO NOT SLEEP WITH PETS.   Day of Surgery: Please shower morning of surgery  Wear Clean/Comfortable clothing the morning of surgery Do not apply any deodorants/lotions.   Remember to brush your teeth WITH YOUR REGULAR TOOTHPASTE.   Questions were answered. Patient verbalized understanding of instructions.

## 2022-08-25 LAB — NOVEL CORONAVIRUS, NAA: SARS-CoV-2, NAA: NOT DETECTED

## 2022-08-25 LAB — SPECIMEN STATUS REPORT

## 2022-08-27 ENCOUNTER — Ambulatory Visit (HOSPITAL_COMMUNITY): Payer: Medicare Other

## 2022-08-27 ENCOUNTER — Ambulatory Visit (HOSPITAL_COMMUNITY)
Admission: RE | Admit: 2022-08-27 | Discharge: 2022-08-27 | Disposition: A | Discharge status: Home / Self Care | Payer: Medicare Other | Attending: Emergency Medicine | Admitting: Emergency Medicine

## 2022-08-27 ENCOUNTER — Ambulatory Visit (HOSPITAL_BASED_OUTPATIENT_CLINIC_OR_DEPARTMENT_OTHER): Payer: Medicare Other | Admitting: Certified Registered Nurse Anesthetist

## 2022-08-27 ENCOUNTER — Ambulatory Visit (HOSPITAL_COMMUNITY): Payer: Medicare Other | Admitting: Certified Registered Nurse Anesthetist

## 2022-08-27 ENCOUNTER — Other Ambulatory Visit: Payer: Self-pay

## 2022-08-27 ENCOUNTER — Encounter (HOSPITAL_COMMUNITY): Payer: Self-pay | Admitting: Emergency Medicine

## 2022-08-27 ENCOUNTER — Encounter (HOSPITAL_COMMUNITY)
Admission: RE | Disposition: A | Discharge status: Home / Self Care | Payer: Self-pay | Source: Home / Self Care | Attending: Emergency Medicine

## 2022-08-27 DIAGNOSIS — C3431 Malignant neoplasm of lower lobe, right bronchus or lung: Secondary | ICD-10-CM

## 2022-08-27 DIAGNOSIS — Z79899 Other long term (current) drug therapy: Secondary | ICD-10-CM | POA: Diagnosis not present

## 2022-08-27 DIAGNOSIS — C3412 Malignant neoplasm of upper lobe, left bronchus or lung: Secondary | ICD-10-CM | POA: Diagnosis not present

## 2022-08-27 DIAGNOSIS — Z87891 Personal history of nicotine dependence: Secondary | ICD-10-CM | POA: Insufficient documentation

## 2022-08-27 DIAGNOSIS — R918 Other nonspecific abnormal finding of lung field: Secondary | ICD-10-CM | POA: Diagnosis not present

## 2022-08-27 DIAGNOSIS — M1990 Unspecified osteoarthritis, unspecified site: Secondary | ICD-10-CM | POA: Insufficient documentation

## 2022-08-27 DIAGNOSIS — Z8042 Family history of malignant neoplasm of prostate: Secondary | ICD-10-CM | POA: Insufficient documentation

## 2022-08-27 DIAGNOSIS — E785 Hyperlipidemia, unspecified: Secondary | ICD-10-CM | POA: Diagnosis not present

## 2022-08-27 DIAGNOSIS — Z8546 Personal history of malignant neoplasm of prostate: Secondary | ICD-10-CM | POA: Diagnosis not present

## 2022-08-27 DIAGNOSIS — I10 Essential (primary) hypertension: Secondary | ICD-10-CM | POA: Insufficient documentation

## 2022-08-27 HISTORY — PX: FIDUCIAL MARKER PLACEMENT: SHX6858

## 2022-08-27 HISTORY — PX: BRONCHIAL BRUSHINGS: SHX5108

## 2022-08-27 HISTORY — PX: BRONCHIAL NEEDLE ASPIRATION BIOPSY: SHX5106

## 2022-08-27 HISTORY — PX: BRONCHIAL BIOPSY: SHX5109

## 2022-08-27 LAB — BASIC METABOLIC PANEL
Anion gap: 8 (ref 5–15)
BUN: 16 mg/dL (ref 8–23)
CO2: 22 mmol/L (ref 22–32)
Calcium: 8.6 mg/dL — ABNORMAL LOW (ref 8.9–10.3)
Chloride: 106 mmol/L (ref 98–111)
Creatinine, Ser: 0.99 mg/dL (ref 0.61–1.24)
GFR, Estimated: >60 mL/min (ref >60)
Glucose, Bld: 112 mg/dL — ABNORMAL HIGH (ref 70–99)
Potassium: 4.1 mmol/L (ref 3.5–5.1)
Sodium: 136 mmol/L (ref 135–145)

## 2022-08-27 LAB — CBC
HCT: 38.5 % — ABNORMAL LOW (ref 39.0–52.0)
Hemoglobin: 13.8 g/dL (ref 13.0–17.0)
MCH: 31.6 pg (ref 26.0–34.0)
MCHC: 35.8 g/dL (ref 30.0–36.0)
MCV: 88.1 fL (ref 80.0–100.0)
Platelets: 181 10*3/uL (ref 150–400)
RBC: 4.37 MIL/uL (ref 4.22–5.81)
RDW: 11.9 % (ref 11.5–15.5)
WBC: 5.7 10*3/uL (ref 4.0–10.5)
nRBC: 0 % (ref 0.0–0.2)

## 2022-08-27 SURGERY — BRONCHOSCOPY, WITH BIOPSY USING ELECTROMAGNETIC NAVIGATION
Anesthesia: General | Laterality: Bilateral

## 2022-08-27 MED ORDER — PHENYLEPHRINE 80 MCG/ML (10ML) SYRINGE FOR IV PUSH (FOR BLOOD PRESSURE SUPPORT)
PREFILLED_SYRINGE | INTRAVENOUS | Status: DC | PRN
  Administered 2022-08-27: 160 ug via INTRAVENOUS

## 2022-08-27 MED ORDER — FENTANYL CITRATE (PF) 100 MCG/2ML IJ SOLN
INTRAMUSCULAR | Status: DC | PRN
  Administered 2022-08-27 (×2): 50 ug via INTRAVENOUS

## 2022-08-27 MED ORDER — LACTATED RINGERS IV SOLN: INTRAVENOUS | Status: DC

## 2022-08-27 MED ORDER — CHLORHEXIDINE GLUCONATE 0.12 % MT SOLN
15.0000 mL | Freq: Once | OROMUCOSAL | Status: AC
  Administered 2022-08-27: 15 mL via OROMUCOSAL
  Filled 2022-08-27 (×2): qty 15

## 2022-08-27 MED ORDER — PROPOFOL 500 MG/50ML IV EMUL
INTRAVENOUS | Status: DC | PRN
  Administered 2022-08-27: 125 ug/kg/min via INTRAVENOUS

## 2022-08-27 MED ORDER — ACETAMINOPHEN 500 MG PO TABS: 1000.0000 mg | ORAL_TABLET | Freq: Once | ORAL | Status: DC

## 2022-08-27 MED ORDER — ROCURONIUM BROMIDE 10 MG/ML (PF) SYRINGE
PREFILLED_SYRINGE | INTRAVENOUS | Status: DC | PRN
  Administered 2022-08-27: 40 mg via INTRAVENOUS
  Administered 2022-08-27 (×2): 20 mg via INTRAVENOUS

## 2022-08-27 MED ORDER — ONDANSETRON HCL 4 MG/2ML IJ SOLN
INTRAMUSCULAR | Status: DC | PRN
  Administered 2022-08-27: 4 mg via INTRAVENOUS

## 2022-08-27 MED ORDER — AMISULPRIDE (ANTIEMETIC) 5 MG/2ML IV SOLN
INTRAVENOUS | Status: AC
  Filled 2022-08-27: qty 4

## 2022-08-27 MED ORDER — AMISULPRIDE (ANTIEMETIC) 5 MG/2ML IV SOLN
10.0000 mg | Freq: Once | INTRAVENOUS | Status: AC | PRN
  Administered 2022-08-27: 10 mg via INTRAVENOUS

## 2022-08-27 MED ORDER — PHENYLEPHRINE HCL-NACL 20-0.9 MG/250ML-% IV SOLN
INTRAVENOUS | Status: DC | PRN
  Administered 2022-08-27: 40 ug/min via INTRAVENOUS

## 2022-08-27 MED ORDER — FENTANYL CITRATE (PF) 100 MCG/2ML IJ SOLN: 25.0000 ug | INTRAMUSCULAR | Status: DC | PRN

## 2022-08-27 MED ORDER — SILODOSIN 4 MG PO CAPS: 4.0000 mg | ORAL_CAPSULE | Freq: Every day | ORAL | Status: DC | PRN

## 2022-08-27 MED ORDER — PROPOFOL 10 MG/ML IV BOLUS
INTRAVENOUS | Status: DC | PRN
  Administered 2022-08-27: 150 mg via INTRAVENOUS

## 2022-08-27 MED ORDER — SUGAMMADEX SODIUM 200 MG/2ML IV SOLN
INTRAVENOUS | Status: DC | PRN
  Administered 2022-08-27: 200 mg via INTRAVENOUS

## 2022-08-27 MED ORDER — EPHEDRINE SULFATE-NACL 50-0.9 MG/10ML-% IV SOSY
PREFILLED_SYRINGE | INTRAVENOUS | Status: DC | PRN
  Administered 2022-08-27: 5 mg via INTRAVENOUS
  Administered 2022-08-27: 10 mg via INTRAVENOUS

## 2022-08-27 MED ORDER — LIDOCAINE 2% (20 MG/ML) 5 ML SYRINGE
INTRAMUSCULAR | Status: DC | PRN
  Administered 2022-08-27: 100 mg via INTRAVENOUS

## 2022-08-27 MED ORDER — DEXAMETHASONE SODIUM PHOSPHATE 10 MG/ML IJ SOLN
INTRAMUSCULAR | Status: DC | PRN
  Administered 2022-08-27: 10 mg via INTRAVENOUS

## 2022-08-27 MED ORDER — ONDANSETRON HCL 4 MG/2ML IJ SOLN: 4.0000 mg | Freq: Once | INTRAMUSCULAR | Status: DC | PRN

## 2022-08-27 MED ORDER — ACETAMINOPHEN 10 MG/ML IV SOLN: 1000.0000 mg | Freq: Once | INTRAVENOUS | Status: DC | PRN

## 2022-08-27 SURGICAL SUPPLY — 1 items: superlock fiducial IMPLANT

## 2022-08-27 NOTE — Interval H&P Note (Signed)
History and Physical Interval Note:  08/27/2022 9:54 AM  Chad Moreno  has presented today for surgery, with the diagnosis of BILATERAL PULMONARY NODULES.  The various methods of treatment have been discussed with the patient and family. After consideration of risks, benefits and other options for treatment, the patient has consented to  Procedure(s): ROBOTIC ASSISTED NAVIGATIONAL BRONCHOSCOPY (Bilateral) as a surgical intervention.  The patient's history has been reviewed, patient examined, no change in status, stable for surgery.  I have reviewed the patient's chart and labs.  Questions were answered to the patient's satisfaction.     Collene Gobble

## 2022-08-27 NOTE — Anesthesia Postprocedure Evaluation (Signed)
Anesthesia Post Note  Patient: Chad Moreno  Procedure(s) Performed: ROBOTIC ASSISTED NAVIGATIONAL BRONCHOSCOPY (Bilateral) BRONCHIAL BRUSHINGS BRONCHIAL NEEDLE ASPIRATION BIOPSIES BRONCHIAL BIOPSIES FIDUCIAL MARKER PLACEMENT     Patient location during evaluation: PACU Anesthesia Type: General Level of consciousness: awake Pain management: pain level controlled Vital Signs Assessment: post-procedure vital signs reviewed and stable Respiratory status: spontaneous breathing, nonlabored ventilation and respiratory function stable Cardiovascular status: blood pressure returned to baseline and stable Postop Assessment: no apparent nausea or vomiting Anesthetic complications: no   No notable events documented.  Last Vitals:  Vitals:   08/27/22 1215 08/27/22 1245  BP: (!) 153/73 (!) 174/87  Pulse: 73 83  Resp: 15 18  Temp:  37.1 C  SpO2: 94% 93%    Last Pain:  Vitals:   08/27/22 1245  TempSrc:   PainSc: 0-No pain                 Shanessa Hodak P Denver Bentson

## 2022-08-27 NOTE — Op Note (Signed)
Video Bronchoscopy with Robotic Assisted Bronchoscopic Navigation   Date of Operation: 08/27/2022   Pre-op Diagnosis: Bilateral pulmonary nodules  Post-op Diagnosis: Same  Surgeon: Baltazar Apo  Assistants: None  Anesthesia: General endotracheal anesthesia  Operation: Flexible video fiberoptic bronchoscopy with robotic assistance and biopsies.  Estimated Blood Loss: Minimal  Complications: None  Indications and History: Chad Moreno is a 82 y.o. male with history of newly diagnosed prostate cancer.  He has bilateral hypermetabolic pulmonary nodules noted on CT scan and then PET scan.  Recommendation made to achieve a tissue diagnosis via robotic assisted navigational bronchoscopy. The risks, benefits, complications, treatment options and expected outcomes were discussed with the patient.  The possibilities of pneumothorax, pneumonia, reaction to medication, pulmonary aspiration, perforation of a viscus, bleeding, failure to diagnose a condition and creating a complication requiring transfusion or operation were discussed with the patient who freely signed the consent.    Description of Procedure: The patient was seen in the Preoperative Area, was examined and was deemed appropriate to proceed.  The patient was taken to [facility] endoscopy room 3, identified as Chad Moreno and the procedure verified as Flexible Video Fiberoptic Bronchoscopy.  A Time Out was held and the above information confirmed.   Prior to the date of the procedure a high-resolution CT scan of the chest was performed. Utilizing ION software program a virtual tracheobronchial tree was generated to allow the creation of distinct navigation pathways to the patient's parenchymal abnormalities. After being taken to the operating room general anesthesia was initiated and the patient  was orally intubated. The video fiberoptic bronchoscope was introduced via the endotracheal tube and a general inspection was performed which showed  normal right and left lung anatomy. Aspiration of the bilateral mainstems was completed to remove any remaining secretions. Robotic catheter inserted into patient's endotracheal tube.   Target #1 right lower lobe pulmonary nodule: The distinct navigation pathways prepared prior to this procedure were then utilized to navigate to patient's lesion identified on CT scan. The robotic catheter was secured into place and the vision probe was withdrawn.  Lesion location was approximated using fluoroscopy and radial endobronchial ultrasound for peripheral targeting.  Local targeting was performed using Cios three-dimensional imaging.  Under fluoroscopic guidance transbronchial needle brushings, transbronchial needle biopsies, and transbronchial forceps biopsies were performed to be sent for cytology and pathology.  Under fluoroscopic guidance a single fiducial marker was placed adjacent to the nodule  Target #2 left upper lobe pulmonary nodule: The distinct navigation pathways prepared prior to this procedure were then utilized to navigate to patient's lesion identified on CT scan. The robotic catheter was secured into place and the vision probe was withdrawn.  Lesion location was approximated using fluoroscopy and radial endobronchial ultrasound for peripheral targeting.  Local registration and targeting was performed using Cios three-dimensional imaging.  Under fluoroscopic guidance transbronchial needle brushings, transbronchial needle biopsies, and transbronchial forceps biopsies were performed to be sent for cytology and pathology.  Under fluoroscopic guidance a single fiducial marker was placed adjacent to the nodule  At the end of the procedure a general airway inspection was performed and there was no evidence of active bleeding. The bronchoscope was removed.  The patient tolerated the procedure well. There was no significant blood loss and there were no obvious complications. A post-procedural chest x-ray  is pending.  Samples Target #1: 1. Transbronchial needle brushings from right lower lobe superior segment nodule 2. Transbronchial Wang needle biopsies from right lower lobe superior segment nodule  3. Transbronchial forceps biopsies from right lower lobe superior segment nodule  Samples Target #2: 1. Transbronchial needle brushings from left upper lobe nodule 2. Transbronchial Wang needle biopsies from left upper lobe nodule 3. Transbronchial forceps biopsies from left upper lobe nodule   Plans:  The patient will be discharged from the PACU to home when recovered from anesthesia and after chest x-ray is reviewed. We will review the cytology, pathology and microbiology results with the patient when they become available. Outpatient followup will be with Dr. Lamonte Sakai.   Baltazar Apo, MD, PhD 08/27/2022, 11:32 AM [facility] [phone] or if no answer before 7:00PM call [phone] For any issues after 7:00PM please call eLink [phone]

## 2022-08-27 NOTE — Research (Signed)
Title: A multi-center, prospective, single-arm, observational study to evaluate real-world outcomes for the shape-sensing Ion endoluminal system  Primary Outcome: Evaluate procedure characteristics and short and long-term patient outcomes following shape-sensing robotic-assisted bronchoscopy (ssRAB) utilizing the Ion Endoluminal System for lung lesion localization or biopsy.   Protocol # / Study Name: ISI-ION-003 Clinical Trials #: [id number] Sponsor: [place] Investigator: Dr. Leory Plowman Icard   Key Features of Ion Endoluminal System (referred to as "Ion") Ion is the first FDA cleared bronchoscopy system that uses fiber optic shape sensing technology to inform on location within the airways. Its catheter/tool channel has a smaller outer diameter (3.5 mm) in comparison to conventional bronchoscopes, allowing it to navigate into the smaller airways of the periphery.     Key Inclusion Criteria Subject is 18 years or older at the time of the procedure Subject is a candidate for a planned, elective RAB lung lesion localization or biopsy procedure in which the Ion Endoluminal System is planned to be utilized.  Subject  able to understand and adhere to study requirements and provide informed consent.   Key Exclusion Criteria Subject is under the care of a Herbalist and is unable to provide informed consent on their own accord.  Subject is participating in an interventional research study or research study with investigational agents with an unknown safety profile that would interfere with participation or the results of this study.  Male subjects who are pregnant or nursing at the time of the index Ion procedure, as determined by standard site practices. Subjects that are incarcerated or institutionalized under court order, or other vulnerable populations.    Previous Clinical Trials Since receiving FDA clearance in Feb 2019, Ion has been adopted  commercially by over 226 centers in the [place], and utilized in over 40,000 procedures.  The first in-human study enrolled 18 subjects with a mean lesion size of 14.8 mm and the overall diagnostic yield was 79.3%, with no adverse events. 17 (58.6%) lesions were reported to have a bronchus sign available on CT imaging.  A multi-center study published results in 2022, with 270 lesions biopsied in 241 patients using Ion. The mean largest cardinal lesion size was 18.86.21m, and the mean airway generation count was 7.01.6. Asymptomatic pneumothorax occurred in 3.3% of subjects, and 0.8% experienced airway bleeding.   Another study provided preliminary results in 2022, with 87% sensitivity for malignancy, a diagnostic yield of 81%, and a mean lesion size of 16 mm. 75% of biopsy cases were bronchus-sign negative. 4% of subjects experienced pneumothoraces (including those requiring intervention), and 0.8% of subjects experienced airway bleeding requiring wedging or balloon tamponade.  A single-center study captured 131 consecutive procedures of pulmonary biopsy using Ion. The navigational success rate was 98.7%, with an overall diagnostic yield of 81.7%, an overall complication rate of 3%, and a pneumothorax rate of 1.5%.    [facility] @ CCataioCoordinator note:   This visit for Subject Chad VANAwith DOB: [birth year]on 08/27/2022 for the above protocol is Visit/Encounter # Pre-Procedure, Intra-Procedure, and Post-Procedure  and is for purpose of research.   The consent for this encounter is under:  Protocol Version 1.0 Investigator Brochure Version N/A Consent Version Revision A, dated [id number] is currently IRB approved.   FJasmine Aweexpressed continued interest and consent in continuing as a study subject. Subject confirmed that there was no change in contact information (e.g. address, telephone, email). Subject thanked for participation in research and contribution to  science. In this visit 08/27/2022 the subject will be evaluated by Sub-Investigator named Dr. Lamonte Sakai. This research coordinator has verified that the above investigator is up to date with his/her training logs.   The Subject was informed that the PI continues to have oversight of the subject's visits and course through relevant discussions, reviews, and also specifically of this visit by routing of this note to the PI.  The research study was discussed with the subject in the pre-operative room. The study was explained in detail including all the contents of the informed consent document. The subject was encouraged to ask questions. All questions were answered to their satisfaction. The IRB approved informed consent was signed, and a copy was given to the subject. After obtaining consent, the subject underwent scheduled procedure using the ion endoluminal system. Data collection was completed per protocol. Refer to paper source subject binder for further details.      Signed by  Chad Persia, MD Clinical Research Coordinator / Sub-Investigator  [facility]  [place], [place] 3:02 PM 08/27/2022

## 2022-08-27 NOTE — Anesthesia Preprocedure Evaluation (Signed)
Anesthesia Evaluation  Patient identified by MRN, date of birth, ID band Patient awake    Reviewed: Allergy & Precautions, NPO status , Patient's Chart, lab work & pertinent test results  Airway Mallampati: II  TM Distance: >3 FB Neck ROM: Full    Dental no notable dental hx.    Pulmonary former smoker   Pulmonary exam normal        Cardiovascular hypertension, Pt. on medications Normal cardiovascular exam     Neuro/Psych negative neurological ROS  negative psych ROS   GI/Hepatic negative GI ROS, Neg liver ROS,,,  Endo/Other  negative endocrine ROS    Renal/GU negative Renal ROS     Musculoskeletal  (+) Arthritis ,    Abdominal   Peds  Hematology negative hematology ROS (+)   Anesthesia Other Findings BILATERAL PULMONARY NODULES  Reproductive/Obstetrics                             Anesthesia Physical Anesthesia Plan  ASA: 2  Anesthesia Plan: General   Post-op Pain Management:    Induction: Intravenous  PONV Risk Score and Plan: 2 and Ondansetron, Dexamethasone and Treatment may vary due to age or medical condition  Airway Management Planned: Oral ETT  Additional Equipment:   Intra-op Plan:   Post-operative Plan: Extubation in OR  Informed Consent: I have reviewed the patients History and Physical, chart, labs and discussed the procedure including the risks, benefits and alternatives for the proposed anesthesia with the patient or authorized representative who has indicated his/her understanding and acceptance.     Dental advisory given  Plan Discussed with: CRNA  Anesthesia Plan Comments:        Anesthesia Quick Evaluation

## 2022-08-27 NOTE — Discharge Instructions (Signed)
Flexible Bronchoscopy, Care After This sheet gives you information about how to care for yourself after your test. Your doctor may also give you more specific instructions. If you have problems or questions, contact your doctor. Follow these instructions at home: Eating and drinking When your numbness is gone and your cough and gag reflexes have come back, you may: Eat only soft foods. Slowly drink liquids. The day after the test, go back to your normal diet. Driving Do not drive for 24 hours if you were given a medicine to help you relax (sedative). Do not drive or use heavy machinery while taking prescription pain medicine. General instructions  Take over-the-counter and prescription medicines only as told by your doctor. Return to your normal activities as told. Ask what activities are safe for you. Do not use any products that have nicotine or tobacco in them. This includes cigarettes and e-cigarettes. If you need help quitting, ask your doctor. Keep all follow-up visits as told by your doctor. This is important. It is very important if you had a tissue sample (biopsy) taken. Get help right away if: You have shortness of breath that gets worse. You get light-headed. You feel like you are going to pass out (faint). You have chest pain. You cough up: More than a little blood. More blood than before. Summary Do not eat or drink anything (not even water) for 2 hours after your test, or until your numbing medicine wears off. Do not use cigarettes. Do not use e-cigarettes. Get help right away if you have chest pain.  Please call our office for any questions or concerns.  [phone].  You can restart your aspirin on 08/28/2022  This information is not intended to replace advice given to you by your health care provider. Make sure you discuss any questions you have with your health care provider. Document Released: 04/08/2009 Document Revised: 05/24/2017 Document Reviewed:  06/29/2016 Elsevier Patient Education  2020 [employer].

## 2022-08-27 NOTE — Anesthesia Procedure Notes (Signed)
Procedure Name: Intubation Date/Time: 08/27/2022 10:14 AM  Performed by: Genelle Bal, CRNAPre-anesthesia Checklist: Patient identified, Emergency Drugs available, Suction available and Patient being monitored Patient Re-evaluated:Patient Re-evaluated prior to induction Oxygen Delivery Method: Circle system utilized Preoxygenation: Pre-oxygenation with 100% oxygen Induction Type: IV induction Ventilation: Mask ventilation without difficulty Laryngoscope Size: Miller and 2 Grade View: Grade I Tube type: Oral Tube size: 8.5 mm Number of attempts: 1 Airway Equipment and Method: Stylet and Oral airway Placement Confirmation: ETT inserted through vocal cords under direct vision, positive ETCO2 and breath sounds checked- equal and bilateral Secured at: 22 cm Tube secured with: Tape Dental Injury: Teeth and Oropharynx as per pre-operative assessment

## 2022-08-27 NOTE — Transfer of Care (Signed)
Immediate Anesthesia Transfer of Care Note  Patient: Chad Moreno  Procedure(s) Performed: ROBOTIC ASSISTED NAVIGATIONAL BRONCHOSCOPY (Bilateral) BRONCHIAL BRUSHINGS BRONCHIAL NEEDLE ASPIRATION BIOPSIES BRONCHIAL BIOPSIES FIDUCIAL MARKER PLACEMENT  Patient Location: PACU  Anesthesia Type:General  Level of Consciousness: awake, alert , and oriented  Airway & Oxygen Therapy: Patient Spontanous Breathing and Patient connected to face mask oxygen  Post-op Assessment: Report given to RN and Post -op Vital signs reviewed and stable  Post vital signs: Reviewed and stable  Last Vitals:  Vitals Value Taken Time  BP 136/70 08/27/22 1145  Temp    Pulse 75 08/27/22 1147  Resp 18 08/27/22 1147  SpO2 97 % 08/27/22 1147  Vitals shown include unvalidated device data.  Last Pain:  Vitals:   08/27/22 0859  TempSrc: Oral  PainSc: 0-No pain         Complications: No notable events documented.

## 2022-08-29 ENCOUNTER — Telehealth: Payer: Self-pay | Admitting: Emergency Medicine

## 2022-08-29 LAB — CYTOLOGY - NON PAP

## 2022-08-29 NOTE — Telephone Encounter (Signed)
Called the patient to discuss bronchoscopy results.  No answer left message and will call them back.  Both the right lower lobe and the left upper lobe nodules are consistent with adenocarcinoma, lung primary

## 2022-08-29 NOTE — Telephone Encounter (Signed)
Tried the patient again, left VM

## 2022-08-30 ENCOUNTER — Encounter (HOSPITAL_COMMUNITY): Payer: Self-pay | Admitting: Emergency Medicine

## 2022-08-31 ENCOUNTER — Telehealth: Payer: Self-pay | Admitting: Urology

## 2022-08-31 NOTE — Progress Notes (Signed)
RN left voicemail for call back.  

## 2022-08-31 NOTE — Telephone Encounter (Signed)
I called and spoke with the patient regarding his recent pathology from his bronchoscopy procedure. He had already spoken with Dr. Lamonte Sakai and was aware that both nodules did show NSCLC, adenocarcinoma, consistent with lung primaries, but appear to be synchronous early stage lung cancers that can be treated with stereotactic radiotherapy (SBRT). We discussed this today and he is interested in proceeding with the recommended 3 fraction course of SBRT to the bilateral lung nodules. We reviewed the anticipated acute and late sequelae associated with radiation in this setting. He was encouraged to ask questions that ere answered to his stated satisfaction and gave verbal consent to proceed with treatment planning. We will coordinate for CT Simulation next week, in anticipation of beginning his treatments in the near future.  Nicholos Johns, MMS, PA-C [place] [facility] [place]: [phone]  Fax: [phone]

## 2022-08-31 NOTE — Telephone Encounter (Signed)
I reviewed bronchoscopy results with the patient and his wife by phone.  Both the right lower lobe and left upper lobe nodules are consistent with lung adenocarcinoma.  In absence of mediastinal adenopathy, hilar adenopathy I suspect that these are 2 synchronous primaries, stage I.  We discussed the options for treatment.  He did ask about possible robotic VATS resection.  I explained that because he has bilateral lesions, and the unlikely possibility that this is stage IV disease, surgery may not be the best option.  He is going to follow-up with Dr. Tammi Klippel and I am sure they will discuss possible SBRT

## 2022-08-31 NOTE — Telephone Encounter (Signed)
Called pt at [phone], left a VM. Will call him back

## 2022-09-05 DIAGNOSIS — C3491 Malignant neoplasm of unspecified part of right bronchus or lung: Secondary | ICD-10-CM | POA: Insufficient documentation

## 2022-09-05 DIAGNOSIS — C3492 Malignant neoplasm of unspecified part of left bronchus or lung: Secondary | ICD-10-CM | POA: Insufficient documentation

## 2022-09-05 NOTE — Progress Notes (Signed)
  Radiation Oncology         [phone] ________________________________  Name: Chad Moreno MRN: [id number]  Date: 09/06/2022  DOB: Jul 15, 1940  STEREOTACTIC BODY RADIOTHERAPY SIMULATION AND TREATMENT PLANNING NOTE    ICD-10-CM   1. Non-small cell cancer of left lung (HCC)  C34.92     2. Non-small cell lung cancer, right (HCC)  C34.91       DIAGNOSIS:  Synchronous Stage IA, NSCLC in right lower lobe lung and left upper lobe lung.  NARRATIVE:  The patient was brought to the [facility].  Identity was confirmed.  All relevant records and images related to the planned course of therapy were reviewed.  The patient freely provided informed written consent to proceed with treatment after reviewing the details related to the planned course of therapy. The consent form was witnessed and verified by the simulation staff.  Then, the patient was set-up in a stable reproducible  supine position for radiation therapy.  A BodyFix immobilization pillow was fabricated for reproducible positioning.  Then I personally applied the abdominal compression paddle to limit respiratory excursion.  4D respiratoy motion management CT images were obtained.  Surface markings were placed.  The CT images were loaded into the planning software.  Then, using Cine, MIP, and standard views, the internal target volume (ITV) and planning target volumes (PTV) were delinieated, and avoidance structures were contoured.  Treatment planning then occurred.  The radiation prescription was entered and confirmed.  A total of two complex treatment devices were fabricated in the form of the BodyFix immobilization pillow and a neck accuform cushion.  I have requested : 3D Simulation  I have requested a DVH of the following structures: Heart, Lungs, Esophagus, Chest Wall, Brachial Plexus, Major Blood Vessels, and targets.  SPECIAL TREATMENT PROCEDURE:  The planned course of therapy using radiation constitutes a special  treatment procedure. Special care is required in the management of this patient for the following reasons. This treatment constitutes a Special Treatment Procedure for the following reason: [ High dose per fraction requiring special monitoring for increased toxicities of treatment including daily imaging..  The special nature of the planned course of radiotherapy will require increased physician supervision and oversight to ensure patient's safety with optimal treatment outcomes.  This requires extended time and effort.    RESPIRATORY MOTION MANAGEMENT SIMULATION:  In order to account for effect of respiratory motion on target structures and other organs in the planning and delivery of radiotherapy, this patient underwent respiratory motion management simulation.  To accomplish this, when the patient was brought to the CT simulation planning suite, 4D respiratoy motion management CT images were obtained.  The CT images were loaded into the planning software.  Then, using a variety of tools including Cine, MIP, and standard views, the target volume and planning target volumes (PTV) were delineated.  Avoidance structures were contoured.  Treatment planning then occurred.  Dose volume histograms were generated and reviewed for each of the requested structure.  The resulting plan was carefully reviewed and approved today.  PLAN:  The patient will receive 54 Gy in 3 fractions to each target.  ________________________________  Sheral Apley. Tammi Klippel, M.D.

## 2022-09-06 ENCOUNTER — Other Ambulatory Visit: Payer: Self-pay

## 2022-09-06 ENCOUNTER — Ambulatory Visit
Admission: RE | Admit: 2022-09-06 | Discharge: 2022-09-06 | Disposition: A | Discharge status: Home / Self Care | Payer: Medicare Other | Source: Ambulatory Visit | Attending: Radiation Oncology | Admitting: Radiation Oncology

## 2022-09-06 DIAGNOSIS — C3491 Malignant neoplasm of unspecified part of right bronchus or lung: Secondary | ICD-10-CM | POA: Diagnosis not present

## 2022-09-06 DIAGNOSIS — C3431 Malignant neoplasm of lower lobe, right bronchus or lung: Secondary | ICD-10-CM | POA: Diagnosis not present

## 2022-09-06 DIAGNOSIS — Z87891 Personal history of nicotine dependence: Secondary | ICD-10-CM | POA: Diagnosis not present

## 2022-09-06 DIAGNOSIS — C3412 Malignant neoplasm of upper lobe, left bronchus or lung: Secondary | ICD-10-CM | POA: Diagnosis not present

## 2022-09-06 DIAGNOSIS — C3492 Malignant neoplasm of unspecified part of left bronchus or lung: Secondary | ICD-10-CM | POA: Diagnosis not present

## 2022-09-07 ENCOUNTER — Other Ambulatory Visit: Payer: Self-pay

## 2022-09-07 NOTE — Progress Notes (Signed)
The proposed treatment discussed in conference is for discussion purpose only and is not a binding recommendation.  The patients have not been physically examined, or presented with their treatment options.  Therefore, final treatment plans cannot be decided.  

## 2022-09-11 DIAGNOSIS — C3491 Malignant neoplasm of unspecified part of right bronchus or lung: Secondary | ICD-10-CM | POA: Diagnosis not present

## 2022-09-11 DIAGNOSIS — Z87891 Personal history of nicotine dependence: Secondary | ICD-10-CM | POA: Diagnosis not present

## 2022-09-11 DIAGNOSIS — C3492 Malignant neoplasm of unspecified part of left bronchus or lung: Secondary | ICD-10-CM | POA: Diagnosis not present

## 2022-09-11 DIAGNOSIS — C3412 Malignant neoplasm of upper lobe, left bronchus or lung: Secondary | ICD-10-CM | POA: Diagnosis not present

## 2022-09-11 DIAGNOSIS — C3431 Malignant neoplasm of lower lobe, right bronchus or lung: Secondary | ICD-10-CM | POA: Diagnosis not present

## 2022-09-13 DIAGNOSIS — C61 Malignant neoplasm of prostate: Secondary | ICD-10-CM | POA: Diagnosis not present

## 2022-09-13 DIAGNOSIS — N390 Urinary tract infection, site not specified: Secondary | ICD-10-CM | POA: Diagnosis not present

## 2022-09-18 ENCOUNTER — Other Ambulatory Visit (HOSPITAL_COMMUNITY): Payer: Self-pay | Admitting: Urology

## 2022-09-18 DIAGNOSIS — C61 Malignant neoplasm of prostate: Secondary | ICD-10-CM

## 2022-09-20 ENCOUNTER — Encounter (HOSPITAL_BASED_OUTPATIENT_CLINIC_OR_DEPARTMENT_OTHER): Payer: Self-pay | Admitting: Urology

## 2022-09-20 NOTE — Progress Notes (Signed)
Spoke w/ via phone for pre-op interview--- [facility] needs dos----    EKG           Lab results------ COVID test -----patient states asymptomatic no test needed Arrive at -------0630 NPO after MN NO Solid Food.   Med rec completed Medications to take morning of surgery ----- Norvasc Diabetic medication ----- Patient instructed no nail polish to be worn day of surgery Patient instructed to bring photo id and insurance card day of surgery Patient aware to have Driver (ride ) / caregiver   Wife Neoma Laming for 24 hours after surgery  Patient Special Instructions ----- Pre-Op special Istructions ----- Patient verbalized understanding of instructions that were given at this phone interview. Patient denies shortness of breath, chest pain, fever, cough at this phone interview.

## 2022-09-25 ENCOUNTER — Other Ambulatory Visit: Payer: Self-pay

## 2022-09-25 ENCOUNTER — Ambulatory Visit
Admission: RE | Admit: 2022-09-25 | Discharge: 2022-09-25 | Disposition: A | Discharge status: Home / Self Care | Payer: Medicare Other | Source: Ambulatory Visit | Attending: Radiation Oncology | Admitting: Radiation Oncology

## 2022-09-25 DIAGNOSIS — C61 Malignant neoplasm of prostate: Secondary | ICD-10-CM | POA: Diagnosis not present

## 2022-09-25 DIAGNOSIS — Z87891 Personal history of nicotine dependence: Secondary | ICD-10-CM | POA: Diagnosis not present

## 2022-09-25 DIAGNOSIS — C3412 Malignant neoplasm of upper lobe, left bronchus or lung: Secondary | ICD-10-CM | POA: Diagnosis not present

## 2022-09-25 DIAGNOSIS — Z510 Encounter for antineoplastic radiation therapy: Secondary | ICD-10-CM | POA: Insufficient documentation

## 2022-09-25 DIAGNOSIS — C7951 Secondary malignant neoplasm of bone: Secondary | ICD-10-CM | POA: Diagnosis not present

## 2022-09-25 DIAGNOSIS — C3431 Malignant neoplasm of lower lobe, right bronchus or lung: Secondary | ICD-10-CM | POA: Diagnosis not present

## 2022-09-25 LAB — RAD ONC ARIA SESSION SUMMARY
Course Elapsed Days: 0
Plan Fractions Treated to Date: 1
Plan Fractions Treated to Date: 1
Plan Prescribed Dose Per Fraction: 18 Gy
Plan Prescribed Dose Per Fraction: 18 Gy
Plan Total Fractions Prescribed: 3
Plan Total Fractions Prescribed: 3
Plan Total Prescribed Dose: 54 Gy
Plan Total Prescribed Dose: 54 Gy
Reference Point Dosage Given to Date: 18 Gy
Reference Point Dosage Given to Date: 18 Gy
Reference Point Session Dosage Given: 18 Gy
Reference Point Session Dosage Given: 18 Gy
Session Number: 1

## 2022-09-26 ENCOUNTER — Institutional Professional Consult (permissible substitution): Payer: Medicare Other | Admitting: Emergency Medicine

## 2022-09-26 ENCOUNTER — Ambulatory Visit: Payer: Medicare Other | Admitting: Radiation Oncology

## 2022-09-27 ENCOUNTER — Other Ambulatory Visit: Payer: Self-pay

## 2022-09-27 ENCOUNTER — Ambulatory Visit
Admission: RE | Admit: 2022-09-27 | Discharge: 2022-09-27 | Disposition: A | Discharge status: Home / Self Care | Payer: Medicare Other | Source: Ambulatory Visit | Attending: Radiation Oncology | Admitting: Radiation Oncology

## 2022-09-27 ENCOUNTER — Ambulatory Visit: Payer: Medicare Other | Admitting: Emergency Medicine

## 2022-09-27 DIAGNOSIS — Z510 Encounter for antineoplastic radiation therapy: Secondary | ICD-10-CM | POA: Diagnosis not present

## 2022-09-27 DIAGNOSIS — C3431 Malignant neoplasm of lower lobe, right bronchus or lung: Secondary | ICD-10-CM | POA: Diagnosis not present

## 2022-09-27 DIAGNOSIS — C3412 Malignant neoplasm of upper lobe, left bronchus or lung: Secondary | ICD-10-CM | POA: Diagnosis not present

## 2022-09-27 DIAGNOSIS — Z87891 Personal history of nicotine dependence: Secondary | ICD-10-CM | POA: Diagnosis not present

## 2022-09-27 DIAGNOSIS — C7951 Secondary malignant neoplasm of bone: Secondary | ICD-10-CM | POA: Diagnosis not present

## 2022-09-27 DIAGNOSIS — C61 Malignant neoplasm of prostate: Secondary | ICD-10-CM | POA: Diagnosis not present

## 2022-09-27 DIAGNOSIS — C3491 Malignant neoplasm of unspecified part of right bronchus or lung: Secondary | ICD-10-CM

## 2022-09-27 DIAGNOSIS — C3492 Malignant neoplasm of unspecified part of left bronchus or lung: Secondary | ICD-10-CM

## 2022-09-27 LAB — RAD ONC ARIA SESSION SUMMARY
Course Elapsed Days: 2
Plan Fractions Treated to Date: 2
Plan Fractions Treated to Date: 2
Plan Prescribed Dose Per Fraction: 18 Gy
Plan Prescribed Dose Per Fraction: 18 Gy
Plan Total Fractions Prescribed: 3
Plan Total Fractions Prescribed: 3
Plan Total Prescribed Dose: 54 Gy
Plan Total Prescribed Dose: 54 Gy
Reference Point Dosage Given to Date: 36 Gy
Reference Point Dosage Given to Date: 36 Gy
Reference Point Session Dosage Given: 18 Gy
Reference Point Session Dosage Given: 18 Gy
Session Number: 2

## 2022-10-01 ENCOUNTER — Ambulatory Visit: Payer: Medicare Other

## 2022-10-01 ENCOUNTER — Other Ambulatory Visit: Payer: Self-pay

## 2022-10-01 ENCOUNTER — Encounter: Payer: Self-pay | Admitting: Urology

## 2022-10-01 ENCOUNTER — Ambulatory Visit
Admission: RE | Admit: 2022-10-01 | Discharge: 2022-10-01 | Disposition: A | Discharge status: Home / Self Care | Payer: Medicare Other | Source: Ambulatory Visit | Attending: Radiation Oncology | Admitting: Radiation Oncology

## 2022-10-01 DIAGNOSIS — C61 Malignant neoplasm of prostate: Secondary | ICD-10-CM | POA: Diagnosis not present

## 2022-10-01 DIAGNOSIS — C3492 Malignant neoplasm of unspecified part of left bronchus or lung: Secondary | ICD-10-CM

## 2022-10-01 DIAGNOSIS — C3412 Malignant neoplasm of upper lobe, left bronchus or lung: Secondary | ICD-10-CM | POA: Diagnosis not present

## 2022-10-01 DIAGNOSIS — Z87891 Personal history of nicotine dependence: Secondary | ICD-10-CM | POA: Diagnosis not present

## 2022-10-01 DIAGNOSIS — C3491 Malignant neoplasm of unspecified part of right bronchus or lung: Secondary | ICD-10-CM

## 2022-10-01 DIAGNOSIS — Z510 Encounter for antineoplastic radiation therapy: Secondary | ICD-10-CM | POA: Diagnosis not present

## 2022-10-01 DIAGNOSIS — C7951 Secondary malignant neoplasm of bone: Secondary | ICD-10-CM | POA: Diagnosis not present

## 2022-10-01 DIAGNOSIS — C3431 Malignant neoplasm of lower lobe, right bronchus or lung: Secondary | ICD-10-CM | POA: Diagnosis not present

## 2022-10-01 LAB — RAD ONC ARIA SESSION SUMMARY
Course Elapsed Days: 6
Plan Fractions Treated to Date: 3
Plan Fractions Treated to Date: 3
Plan Prescribed Dose Per Fraction: 18 Gy
Plan Prescribed Dose Per Fraction: 18 Gy
Plan Total Fractions Prescribed: 3
Plan Total Fractions Prescribed: 3
Plan Total Prescribed Dose: 54 Gy
Plan Total Prescribed Dose: 54 Gy
Reference Point Dosage Given to Date: 54 Gy
Reference Point Dosage Given to Date: 54 Gy
Reference Point Session Dosage Given: 18 Gy
Reference Point Session Dosage Given: 18 Gy
Session Number: 3

## 2022-10-02 ENCOUNTER — Ambulatory Visit (HOSPITAL_BASED_OUTPATIENT_CLINIC_OR_DEPARTMENT_OTHER): Payer: Medicare Other | Admitting: Anesthesiology

## 2022-10-02 ENCOUNTER — Encounter (HOSPITAL_BASED_OUTPATIENT_CLINIC_OR_DEPARTMENT_OTHER): Payer: Self-pay | Admitting: Urology

## 2022-10-02 ENCOUNTER — Encounter (HOSPITAL_COMMUNITY): Payer: Self-pay

## 2022-10-02 ENCOUNTER — Ambulatory Visit (HOSPITAL_BASED_OUTPATIENT_CLINIC_OR_DEPARTMENT_OTHER)
Admission: RE | Admit: 2022-10-02 | Discharge: 2022-10-02 | Disposition: A | Discharge status: Home / Self Care | Payer: Medicare Other | Attending: Urology | Admitting: Urology

## 2022-10-02 ENCOUNTER — Ambulatory Visit (HOSPITAL_COMMUNITY): Payer: Medicare Other

## 2022-10-02 ENCOUNTER — Other Ambulatory Visit: Payer: Self-pay

## 2022-10-02 ENCOUNTER — Encounter (HOSPITAL_BASED_OUTPATIENT_CLINIC_OR_DEPARTMENT_OTHER)
Admission: RE | Disposition: A | Discharge status: Home / Self Care | Payer: Self-pay | Source: Home / Self Care | Attending: Urology

## 2022-10-02 DIAGNOSIS — E785 Hyperlipidemia, unspecified: Secondary | ICD-10-CM | POA: Diagnosis not present

## 2022-10-02 DIAGNOSIS — Z87891 Personal history of nicotine dependence: Secondary | ICD-10-CM | POA: Diagnosis not present

## 2022-10-02 DIAGNOSIS — M1990 Unspecified osteoarthritis, unspecified site: Secondary | ICD-10-CM

## 2022-10-02 DIAGNOSIS — C61 Malignant neoplasm of prostate: Secondary | ICD-10-CM | POA: Diagnosis not present

## 2022-10-02 DIAGNOSIS — Z79899 Other long term (current) drug therapy: Secondary | ICD-10-CM | POA: Diagnosis not present

## 2022-10-02 DIAGNOSIS — C3490 Malignant neoplasm of unspecified part of unspecified bronchus or lung: Secondary | ICD-10-CM | POA: Insufficient documentation

## 2022-10-02 DIAGNOSIS — I10 Essential (primary) hypertension: Secondary | ICD-10-CM | POA: Diagnosis not present

## 2022-10-02 HISTORY — DX: Malignant (primary) neoplasm, unspecified: C80.1

## 2022-10-02 HISTORY — PX: SPACE OAR INSTILLATION: SHX6769

## 2022-10-02 HISTORY — PX: GOLD SEED IMPLANT: SHX6343

## 2022-10-02 SURGERY — INSERTION, GOLD SEEDS
Anesthesia: Monitor Anesthesia Care | Site: Prostate

## 2022-10-02 MED ORDER — PROPOFOL 1000 MG/100ML IV EMUL
INTRAVENOUS | Status: AC
  Filled 2022-10-02: qty 100

## 2022-10-02 MED ORDER — CEFAZOLIN SODIUM-DEXTROSE 2-3 GM-%(50ML) IV SOLR
INTRAVENOUS | Status: DC | PRN
  Administered 2022-10-02: 2 g via INTRAVENOUS

## 2022-10-02 MED ORDER — DEXMEDETOMIDINE HCL IN NACL 200 MCG/50ML IV SOLN
INTRAVENOUS | Status: DC | PRN
  Administered 2022-10-02: 8 ug via INTRAVENOUS

## 2022-10-02 MED ORDER — FENTANYL CITRATE (PF) 100 MCG/2ML IJ SOLN
INTRAMUSCULAR | Status: DC | PRN
  Administered 2022-10-02: 25 ug via INTRAVENOUS

## 2022-10-02 MED ORDER — FENTANYL CITRATE (PF) 100 MCG/2ML IJ SOLN
INTRAMUSCULAR | Status: AC
  Filled 2022-10-02: qty 2

## 2022-10-02 MED ORDER — PROPOFOL 500 MG/50ML IV EMUL
INTRAVENOUS | Status: DC | PRN
  Administered 2022-10-02: 200 ug/kg/min via INTRAVENOUS

## 2022-10-02 MED ORDER — LIDOCAINE HCL (PF) 1 % IJ SOLN
INTRAMUSCULAR | Status: DC | PRN
  Administered 2022-10-02: 10 mL

## 2022-10-02 MED ORDER — PROPOFOL 500 MG/50ML IV EMUL
INTRAVENOUS | Status: AC
  Filled 2022-10-02: qty 50

## 2022-10-02 MED ORDER — GLYCOPYRROLATE PF 0.2 MG/ML IJ SOSY
PREFILLED_SYRINGE | INTRAMUSCULAR | Status: DC | PRN
  Administered 2022-10-02: .1 mg via INTRAVENOUS

## 2022-10-02 MED ORDER — PROPOFOL 10 MG/ML IV BOLUS
INTRAVENOUS | Status: DC | PRN
  Administered 2022-10-02: 20 mg via INTRAVENOUS
  Administered 2022-10-02: 10 mg via INTRAVENOUS

## 2022-10-02 MED ORDER — ONDANSETRON HCL 4 MG/2ML IJ SOLN
INTRAMUSCULAR | Status: DC | PRN
  Administered 2022-10-02: 4 mg via INTRAVENOUS

## 2022-10-02 MED ORDER — ONDANSETRON HCL 4 MG/2ML IJ SOLN
INTRAMUSCULAR | Status: AC
  Filled 2022-10-02: qty 2

## 2022-10-02 MED ORDER — LACTATED RINGERS IV SOLN: INTRAVENOUS | Status: DC

## 2022-10-02 MED ORDER — CEFAZOLIN SODIUM 1 G IJ SOLR
INTRAMUSCULAR | Status: AC
  Filled 2022-10-02: qty 20

## 2022-10-02 MED ORDER — PHENYLEPHRINE 80 MCG/ML (10ML) SYRINGE FOR IV PUSH (FOR BLOOD PRESSURE SUPPORT)
PREFILLED_SYRINGE | INTRAVENOUS | Status: DC | PRN
  Administered 2022-10-02: 240 ug via INTRAVENOUS
  Administered 2022-10-02: 160 ug via INTRAVENOUS

## 2022-10-02 MED ORDER — SODIUM CHLORIDE (PF) 0.9 % IJ SOLN
INTRAMUSCULAR | Status: DC | PRN
  Administered 2022-10-02: 10 mL

## 2022-10-02 SURGICAL SUPPLY — 27 items
APL SKNCLS STERI-STRIP NONHPOA (GAUZE/BANDAGES/DRESSINGS)
BENZOIN TINCTURE PRP APPL 2/3 (GAUZE/BANDAGES/DRESSINGS) IMPLANT
BLADE CLIPPER SENSICLIP SURGIC (BLADE) ×2 IMPLANT
CNTNR URN SCR LID CUP LEK RST (MISCELLANEOUS) ×2 IMPLANT
CONT SPEC 4OZ STRL OR WHT (MISCELLANEOUS) ×1
COVER BACK TABLE 60X90IN (DRAPES) ×2 IMPLANT
DRSG TEGADERM 4X4.75 (GAUZE/BANDAGES/DRESSINGS) ×2 IMPLANT
DRSG TEGADERM 8X12 (GAUZE/BANDAGES/DRESSINGS) ×2 IMPLANT
GAUZE SPONGE 4X4 12PLY STRL (GAUZE/BANDAGES/DRESSINGS) ×2 IMPLANT
GLOVE BIO SURGEON STRL SZ7.5 (GLOVE) ×2 IMPLANT
GLOVE ECLIPSE 8.0 STRL XLNG CF (GLOVE) ×2 IMPLANT
IMPL SPACEOAR VUE SYSTEM (Spacer) IMPLANT
IMPLANT SPACEOAR VUE SYSTEM (Spacer) ×1 IMPLANT
KIT TURNOVER CYSTO (KITS) ×2 IMPLANT
MARKER GOLD PRELOAD 1.2X3 (Urological Implant) ×2 IMPLANT
MARKER SKIN DUAL TIP RULER LAB (MISCELLANEOUS) ×2 IMPLANT
NDL SPNL 22GX3.5 QUINCKE BK (NEEDLE) IMPLANT
NEEDLE SPNL 22GX3.5 QUINCKE BK (NEEDLE) IMPLANT
SEED GOLD PRELOAD 1.2X3 (Urological Implant) ×1 IMPLANT
SHEATH ULTRASOUND LF (SHEATH) IMPLANT
SHEATH ULTRASOUND LTX NONSTRL (SHEATH) IMPLANT
SLEEVE SCD COMPRESS KNEE MED (STOCKING) ×2 IMPLANT
SURGILUBE 2OZ TUBE FLIPTOP (MISCELLANEOUS) ×2 IMPLANT
SYR 10ML LL (SYRINGE) IMPLANT
SYR CONTROL 10ML LL (SYRINGE) ×2 IMPLANT
TOWEL OR 17X24 6PK STRL BLUE (TOWEL DISPOSABLE) ×2 IMPLANT
UNDERPAD 30X36 HEAVY ABSORB (UNDERPADS AND DIAPERS) ×2 IMPLANT

## 2022-10-02 NOTE — Transfer of Care (Signed)
Immediate Anesthesia Transfer of Care Note  Patient: Chad Moreno  Procedure(s) Performed: GOLD SEED IMPLANT (Prostate) SPACE OAR INSTILLATION (Prostate)  Patient Location: PACU  Anesthesia Type:MAC  Level of Consciousness: awake, alert , oriented, and patient cooperative  Airway & Oxygen Therapy: Patient Spontanous Breathing  Post-op Assessment: Report given to RN and Post -op Vital signs reviewed and stable  Post vital signs: Reviewed and stable  Last Vitals:  Vitals Value Taken Time  BP 102/68 10/02/22 0953  Temp    Pulse 62 10/02/22 0957  Resp 17 10/02/22 0957  SpO2 91 % 10/02/22 0957  Vitals shown include unvalidated device data.  Last Pain:  Vitals:   10/02/22 0657  TempSrc:   PainSc: 0-No pain         Complications: No notable events documented.

## 2022-10-02 NOTE — Anesthesia Postprocedure Evaluation (Signed)
Anesthesia Post Note  Patient: Chad Moreno  Procedure(s) Performed: GOLD SEED IMPLANT (Prostate) SPACE OAR INSTILLATION (Prostate)     Patient location during evaluation: PACU Anesthesia Type: MAC Level of consciousness: awake and alert and oriented Pain management: pain level controlled Vital Signs Assessment: post-procedure vital signs reviewed and stable Respiratory status: spontaneous breathing, nonlabored ventilation and respiratory function stable Cardiovascular status: stable and blood pressure returned to baseline Postop Assessment: no apparent nausea or vomiting Anesthetic complications: no   No notable events documented.  Last Vitals:  Vitals:   10/02/22 1000 10/02/22 1010  BP: 109/67 133/72  Pulse: 64 61  Resp: 17 12  Temp:  36.6 C  SpO2: 92% 94%    Last Pain:  Vitals:   10/02/22 1030  TempSrc:   PainSc: 0-No pain                 [name]

## 2022-10-02 NOTE — Discharge Instructions (Addendum)
You should avoid strenuous activities today but may resume all normal activities tomorrow.  2.   You can take Tylenol as needed for any pain or discomfort.  3.    Follow up with your radiation oncologist for your simulation appointment as scheduled.  If this is not currently scheduled or you do not know the date/time for that appointment, please contact the radiation oncology office to confirm.    Post Anesthesia Home Care Instructions  Activity: Get plenty of rest for the remainder of the day. A responsible individual must stay with you for 24 hours following the procedure.  For the next 24 hours, DO NOT: -Drive a car -Operate machinery -Drink alcoholic beverages -Take any medication unless instructed by your physician -Make any legal decisions or sign important papers.  Meals: Start with liquid foods such as gelatin or soup. Progress to regular foods as tolerated. Avoid greasy, spicy, heavy foods. If nausea and/or vomiting occur, drink only clear liquids until the nausea and/or vomiting subsides. Call your physician if vomiting continues.  Special Instructions/Symptoms: Your throat may feel dry or sore from the anesthesia or the breathing tube placed in your throat during surgery. If this causes discomfort, gargle with warm salt water. The discomfort should disappear within 24 hours.  

## 2022-10-02 NOTE — Anesthesia Preprocedure Evaluation (Signed)
Anesthesia Evaluation  Patient identified by MRN, date of birth, ID band Patient awake    Reviewed: Allergy & Precautions, NPO status , Patient's Chart, lab work & pertinent test results  Airway Mallampati: II  TM Distance: >3 FB   Mouth opening: Limited Mouth Opening  Dental no notable dental hx.    Pulmonary former smoker Non Small cell lung Ca - undergoing RT   Pulmonary exam normal breath sounds clear to auscultation       Cardiovascular hypertension, Pt. on medications Normal cardiovascular exam Rhythm:Regular Rate:Normal     Neuro/Psych Ocular hypertension  negative psych ROS   GI/Hepatic negative GI ROS, Neg liver ROS,,,  Endo/Other  Hyperlipidemia Impaired fasting Glucose  Renal/GU negative Renal ROS   Prostate Ca    Musculoskeletal  (+) Arthritis , Osteoarthritis,    Abdominal  (+) + obese  Peds  Hematology negative hematology ROS (+)   Anesthesia Other Findings   Reproductive/Obstetrics                              Anesthesia Physical Anesthesia Plan  ASA: 3  Anesthesia Plan: MAC   Post-op Pain Management: Minimal or no pain anticipated   Induction: Intravenous  PONV Risk Score and Plan: 3 and Treatment may vary due to age or medical condition, Propofol infusion and Ondansetron  Airway Management Planned: Natural Airway and Simple Face Mask  Additional Equipment: None  Intra-op Plan:   Post-operative Plan:   Informed Consent: I have reviewed the patients History and Physical, chart, labs and discussed the procedure including the risks, benefits and alternatives for the proposed anesthesia with the patient or authorized representative who has indicated his/her understanding and acceptance.     Dental advisory given  Plan Discussed with: CRNA and Anesthesiologist  Anesthesia Plan Comments:          Anesthesia Quick Evaluation

## 2022-10-02 NOTE — H&P (Signed)
H&P  History of Present Illness: Chad Moreno is a 82 y.o. year old M who presents today for placement of fiducial markers and spaceOAR. No acute complaints  Past Medical History:  Diagnosis Date   Age-related cataract of left eye    Amblyopia, right eye    Arthritis    Cancer    Dysuria    ED (erectile dysfunction)    Elevated PSA    Epidermal cyst    Family history of prostate cancer    Gout    Hip pain    Histoplasmosis    Hyperlipidemia    Hypermetropia, left eye    Hypertension    Impaired glucose tolerance    Joint pain    Left shoulder pain    Lower back pain    Melanocytic nevi, unspecified    Nummular dermatitis    Ocular hypertension    Palmar fascial fibromatosis    Prostate cancer screening    Regular astigmatism, right eye    Senile nuclear sclerosis     Past Surgical History:  Procedure Laterality Date   BRONCHIAL BIOPSY  08/27/2022   Procedure: BRONCHIAL BIOPSIES;  Surgeon: Leslye Peer, MD;  Location: [facility];  Service: Pulmonary;;   BRONCHIAL BRUSHINGS  08/27/2022   Procedure: BRONCHIAL BRUSHINGS;  Surgeon: Leslye Peer, MD;  Location: [facility];  Service: Pulmonary;;   BRONCHIAL NEEDLE ASPIRATION BIOPSY  08/27/2022   Procedure: BRONCHIAL NEEDLE ASPIRATION BIOPSIES;  Surgeon: Leslye Peer, MD;  Location: [facility];  Service: Pulmonary;;   CONSTRICTING FINGER RING EXCISION Left    CYST REMOVAL TRUNK Left    left posterior shoulder   FIDUCIAL MARKER PLACEMENT  08/27/2022   Procedure: FIDUCIAL MARKER PLACEMENT;  Surgeon: Leslye Peer, MD;  Location: [facility];  Service: Pulmonary;;   index finger Left    Prostate needle biopsy      Home Medications:  Current Meds  Medication Sig   amLODipine (NORVASC) 2.5 MG tablet Take 2.5 mg by mouth in the morning.   Ascorbic Acid (VITAMIN C PO) Take 1 tablet by mouth daily with breakfast.   aspirin EC 81 MG tablet Take 81 mg by mouth in the morning.   B Complex Vitamins (VITAMIN-B  COMPLEX) TABS Take 1 tablet by mouth daily with breakfast.   Cholecalciferol (VITAMIN D3) 50 MCG (2000 UT) TABS Take 2,000 Units by mouth daily with breakfast.   gabapentin (NEURONTIN) 100 MG capsule Take 100 mg by mouth 3 (three) times daily as needed (PAIN.).   losartan (COZAAR) 100 MG tablet Take 50 mg by mouth 2 (two) times daily.    meloxicam (MOBIC) 15 MG tablet Take 15 mg by mouth in the morning.   Omega-3 Fatty Acids (FISH OIL PO) Take 1 capsule by mouth daily with breakfast.   POTASSIUM PO Take 1 tablet by mouth daily with breakfast.   Probiotic Product (PROBIOTIC PEARLS ADVANTAGE PO) Take 1 capsule by mouth in the morning.   silodosin (RAPAFLO) 4 MG CAPS capsule Take 1 capsule (4 mg total) by mouth daily as needed (urinary retention.).    Allergies:  Allergies  Allergen Reactions   Sulfa Antibiotics Anaphylaxis    Family History  Problem Relation Age of Onset   Prostate cancer Father     Social History:  reports that he quit smoking about 43 years ago. His smoking use included cigarettes. He has a 5.00 pack-year smoking history. He has never used smokeless tobacco. He reports current alcohol use. He reports  that he does not use drugs.  ROS: A complete review of systems was performed.  All systems are negative except for pertinent findings as noted.  Physical Exam:  Vital signs in last 24 hours: Temp:  [97.9 F (36.6 C)] 97.9 F (36.6 C) (04/09 0636) Pulse Rate:  [78] 78 (04/09 0636) Resp:  [17] 17 (04/09 0636) BP: (159)/(83) 159/83 (04/09 0636) SpO2:  [99 %] 99 % (04/09 0636) Weight:  [105.2 kg] 105.2 kg (04/09 0636) Constitutional:  Alert and oriented, No acute distress Cardiovascular: Regular rate and rhythm Respiratory: Normal respiratory effort, Lungs clear bilaterally GI: Abdomen is soft, nontender, nondistended, no abdominal masses Lymphatic: No lymphadenopathy Neurologic: Grossly intact, no focal deficits Psychiatric: Normal mood and affect   Laboratory  Data:  No results for input(s): "WBC", "HGB", "HCT", "PLT" in the last 72 hours.  No results for input(s): "NA", "K", "CL", "GLUCOSE", "BUN", "CALCIUM", "CREATININE" in the last 72 hours.  Invalid input(s): "CO3"   No results found for this or any previous visit (from the past 24 hour(s)). No results found for this or any previous visit (from the past 240 hour(s)).  Renal Function: No results for input(s): "CREATININE" in the last 168 hours. CrCl cannot be calculated (Patient's most recent lab result is older than the maximum 21 days allowed.).  Radiologic Imaging: No results found.  Assessment:  Chad Moreno is a 82 y.o. year old M with prostate cancer  Plan:  To OR as planned for fiducial markers and spaceOAR. Procedure and risks reviewed (including but not limited to hematuria, infection, malplacement, inability to safely place markers, urinary retention)  Irine Seal, MD 10/02/2022, 7:26 AM  [facility] Pager: [phone]

## 2022-10-02 NOTE — Anesthesia Procedure Notes (Signed)
Procedure Name: MAC Date/Time: 10/02/2022 9:18 AM  Performed by: Bishop Limbo, CRNAPre-anesthesia Checklist: Patient identified, Emergency Drugs available, Suction available and Patient being monitored Patient Re-evaluated:Patient Re-evaluated prior to induction Oxygen Delivery Method: Simple face mask Ventilation: Oral airway inserted - appropriate to patient size Placement Confirmation: positive ETCO2 Dental Injury: Teeth and Oropharynx as per pre-operative assessment

## 2022-10-02 NOTE — Op Note (Signed)
Preoperative diagnosis: Clinically localized adenocarcinoma of the prostate   Postoperative diagnosis: Clinically localized adenocarcinoma of the prostate  Procedure: 1) Placement of fiducial markers into prostate                    2) Insertion of SpaceOAR hydrogel   Surgeon:Luke Durenda Pechacek. M.D.  Anesthesia: General  EBL: Minimal  Complications: None  Indication: Chad Moreno is a 82 y.o. gentleman with clinically localized prostate cancer. After discussing management options for treatment, he elected to proceed with radiotherapy. He presents today for the above procedures. The potential risks, complications, alternative options, and expected recovery course have been discussed in detail with the patient and he has provided informed consent to proceed.  Description of procedure: The patient was administered preoperative antibiotics, placed in the dorsal lithotomy position, and prepped and draped in the usual sterile fashion. Next, transrectal ultrasonography was utilized to visualize the prostate. Three gold fiducial markers were then placed into the prostate via transperineal needles under ultrasound guidance at the left apex, left base, and right mid gland under direct ultrasound guidance. A site in the midline was then selected on the perineum for placement of an 18 g needle with saline. The needle was advanced above the rectum and below Denonvillier's fascia to the mid gland and confirmed to be in the midline on transverse imaging. One cc of saline was injected confirming appropriate expansion of this space. A total of 5 cc of saline was then injected to open the space further bilaterally. The saline syringe was then removed and the SpaceOAR hydrogel was injected with good distribution bilaterally. He tolerated the procedure well and without complications. He was given a voiding trial prior to discharge from the PACU.

## 2022-10-04 ENCOUNTER — Encounter (HOSPITAL_BASED_OUTPATIENT_CLINIC_OR_DEPARTMENT_OTHER): Payer: Self-pay | Admitting: Urology

## 2022-10-04 ENCOUNTER — Telehealth: Payer: Self-pay | Admitting: *Deleted

## 2022-10-04 NOTE — Telephone Encounter (Signed)
CALLED PATIENT TO REMIND OF SIM APPT. FOR 10/05/22- ARRIVAL TIME- 12:45 PM @ [facility], REMINDED PATIENT TO ARRIVE WITH A FULL BLADDER, SPOKE WITH PATIENT AND HE IS AWARE OF THIS APPT. AND THE INSTRUCTIONS

## 2022-10-05 ENCOUNTER — Ambulatory Visit
Admission: RE | Admit: 2022-10-05 | Discharge: 2022-10-05 | Disposition: A | Discharge status: Home / Self Care | Payer: Medicare Other | Source: Ambulatory Visit | Attending: Radiation Oncology | Admitting: Radiation Oncology

## 2022-10-05 ENCOUNTER — Other Ambulatory Visit: Payer: Self-pay

## 2022-10-05 DIAGNOSIS — Z510 Encounter for antineoplastic radiation therapy: Secondary | ICD-10-CM | POA: Diagnosis not present

## 2022-10-05 DIAGNOSIS — Z191 Hormone sensitive malignancy status: Secondary | ICD-10-CM | POA: Diagnosis not present

## 2022-10-05 DIAGNOSIS — C61 Malignant neoplasm of prostate: Secondary | ICD-10-CM | POA: Diagnosis not present

## 2022-10-05 DIAGNOSIS — C7951 Secondary malignant neoplasm of bone: Secondary | ICD-10-CM | POA: Diagnosis not present

## 2022-10-05 NOTE — Progress Notes (Incomplete)
  Radiation Oncology         [phone] ________________________________  Name: Chad Moreno MRN: [id number]  Date: 10/05/2022  DOB: 1940/10/03  SIMULATION AND TREATMENT PLANNING NOTE    ICD-10-CM   1. Malignant neoplasm of prostate  C61       DIAGNOSIS:  82 y.o. 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   NARRATIVE:  The patient was brought to the CT Simulation planning suite.  Identity was confirmed.  All relevant records and images related to the planned course of therapy were reviewed.  The patient freely provided informed written consent to proceed with treatment after reviewing the details related to the planned course of therapy. The consent form was witnessed and verified by the simulation staff.  Then, the patient was set-up in a stable reproducible supine position for radiation therapy.  A vacuum lock pillow device was custom fabricated to position his legs in a reproducible immobilized position.  Then, I performed a urethrogram under sterile conditions to identify the prostatic apex.  CT images were obtained.  Surface markings were placed.  The CT images were loaded into the planning software.  Then the prostate target and avoidance structures including the rectum, bladder, bowel and hips were contoured.  Treatment planning then occurred.  The radiation prescription was entered and confirmed.  A total of one complex treatment devices was fabricated. I have requested : Intensity Modulated Radiotherapy (IMRT) is medically necessary for this case for the following reason:  Rectal sparing.Marland Kitchen  PLAN:  The patient will receive 70 Gy in 28 fractions.  ________________________________  Artist Pais Kathrynn Running, M.D.

## 2022-10-12 ENCOUNTER — Other Ambulatory Visit: Payer: Self-pay | Admitting: Urology

## 2022-10-12 DIAGNOSIS — M25552 Pain in left hip: Secondary | ICD-10-CM | POA: Diagnosis not present

## 2022-10-12 DIAGNOSIS — C3491 Malignant neoplasm of unspecified part of right bronchus or lung: Secondary | ICD-10-CM

## 2022-10-12 DIAGNOSIS — C3492 Malignant neoplasm of unspecified part of left bronchus or lung: Secondary | ICD-10-CM

## 2022-10-12 NOTE — Progress Notes (Signed)
  Radiation Oncology         [phone]) [phone] ________________________________  Name: Chad Moreno MRN: [id number]  Date: 10/01/2022  DOB: December 20, 1940  End of Treatment Note  Diagnosis:   Synchronous Stage IA, NSCLC in right lower lobe lung and left upper lobe lung.      Indication for treatment:  Curative, Definitive SBRT       Radiation treatment dates:   09/25/22 - 10/01/22  Site/dose:   The targets in the RLL and LUL were treated to 54 Gy in 3 fractions of 18 Gy each  Beams/energy:   The patient was treated using stereotactic body radiotherapy according to a 3D conformal radiotherapy plan.  Volumetric arc fields were employed to deliver 6 MV X-rays.  Image guidance was performed with per fraction cone beam CT prior to treatment under personal MD supervision.  Immobilization was achieved using BodyFix Pillow.  Narrative: The patient tolerated radiation treatment relatively well with only mild fatigue.  Plan: The patient will receive a call in about one month from the radiation oncology department and we will make arrangements for a posttreatment CT chest scan approximately 3 months from treatment completion.  ________________________________  Artist Pais Kathrynn Running, M.D.

## 2022-10-17 ENCOUNTER — Emergency Department (HOSPITAL_BASED_OUTPATIENT_CLINIC_OR_DEPARTMENT_OTHER): Payer: Medicare Other

## 2022-10-17 ENCOUNTER — Emergency Department (HOSPITAL_BASED_OUTPATIENT_CLINIC_OR_DEPARTMENT_OTHER)
Admission: EM | Admit: 2022-10-17 | Discharge: 2022-10-17 | Disposition: A | Discharge status: Home / Self Care | Payer: Medicare Other | Attending: Emergency Medicine | Admitting: Emergency Medicine

## 2022-10-17 ENCOUNTER — Emergency Department (HOSPITAL_BASED_OUTPATIENT_CLINIC_OR_DEPARTMENT_OTHER): Payer: Medicare Other | Admitting: Radiology

## 2022-10-17 ENCOUNTER — Other Ambulatory Visit: Payer: Self-pay

## 2022-10-17 ENCOUNTER — Encounter (HOSPITAL_BASED_OUTPATIENT_CLINIC_OR_DEPARTMENT_OTHER): Payer: Self-pay

## 2022-10-17 DIAGNOSIS — M5459 Other low back pain: Secondary | ICD-10-CM | POA: Diagnosis not present

## 2022-10-17 DIAGNOSIS — Z7982 Long term (current) use of aspirin: Secondary | ICD-10-CM | POA: Insufficient documentation

## 2022-10-17 DIAGNOSIS — M545 Low back pain, unspecified: Secondary | ICD-10-CM | POA: Diagnosis not present

## 2022-10-17 DIAGNOSIS — Z79899 Other long term (current) drug therapy: Secondary | ICD-10-CM | POA: Insufficient documentation

## 2022-10-17 DIAGNOSIS — R079 Chest pain, unspecified: Secondary | ICD-10-CM | POA: Diagnosis not present

## 2022-10-17 DIAGNOSIS — K4090 Unilateral inguinal hernia, without obstruction or gangrene, not specified as recurrent: Secondary | ICD-10-CM | POA: Insufficient documentation

## 2022-10-17 DIAGNOSIS — K5730 Diverticulosis of large intestine without perforation or abscess without bleeding: Secondary | ICD-10-CM | POA: Diagnosis not present

## 2022-10-17 DIAGNOSIS — I700 Atherosclerosis of aorta: Secondary | ICD-10-CM | POA: Diagnosis not present

## 2022-10-17 DIAGNOSIS — I10 Essential (primary) hypertension: Secondary | ICD-10-CM | POA: Insufficient documentation

## 2022-10-17 LAB — CBC
HCT: 37.1 % — ABNORMAL LOW (ref 39.0–52.0)
Hemoglobin: 13.1 g/dL (ref 13.0–17.0)
MCH: 31.6 pg (ref 26.0–34.0)
MCHC: 35.3 g/dL (ref 30.0–36.0)
MCV: 89.4 fL (ref 80.0–100.0)
Platelets: 170 10*3/uL (ref 150–400)
RBC: 4.15 MIL/uL — ABNORMAL LOW (ref 4.22–5.81)
RDW: 13 % (ref 11.5–15.5)
WBC: 8.3 10*3/uL (ref 4.0–10.5)
nRBC: 0 % (ref 0.0–0.2)

## 2022-10-17 LAB — BASIC METABOLIC PANEL
Anion gap: 10 (ref 5–15)
BUN: 27 mg/dL — ABNORMAL HIGH (ref 8–23)
CO2: 26 mmol/L (ref 22–32)
Calcium: 9.1 mg/dL (ref 8.9–10.3)
Chloride: 103 mmol/L (ref 98–111)
Creatinine, Ser: 1.05 mg/dL (ref 0.61–1.24)
GFR, Estimated: >60 mL/min (ref >60)
Glucose, Bld: 111 mg/dL — ABNORMAL HIGH (ref 70–99)
Potassium: 4.4 mmol/L (ref 3.5–5.1)
Sodium: 139 mmol/L (ref 135–145)

## 2022-10-17 LAB — URINALYSIS, ROUTINE W REFLEX MICROSCOPIC
Bacteria, UA: NONE SEEN
Bilirubin Urine: NEGATIVE
Glucose, UA: NEGATIVE mg/dL
Hgb urine dipstick: NEGATIVE
Ketones, ur: NEGATIVE mg/dL
Nitrite: NEGATIVE
Specific Gravity, Urine: 1.018 (ref 1.005–1.030)
pH: 6.5 (ref 5.0–8.0)

## 2022-10-17 LAB — TROPONIN I (HIGH SENSITIVITY): Troponin I (High Sensitivity): 7 ng/L (ref <18)

## 2022-10-17 MED ORDER — HYDROCODONE-ACETAMINOPHEN 5-325 MG PO TABS: 1.0000 | ORAL_TABLET | Freq: Four times a day (QID) | ORAL | 0 refills | Status: DC | PRN

## 2022-10-17 MED ORDER — FENTANYL CITRATE PF 50 MCG/ML IJ SOSY
50.0000 ug | PREFILLED_SYRINGE | Freq: Once | INTRAMUSCULAR | Status: AC
  Administered 2022-10-17: 50 ug via INTRAVENOUS
  Filled 2022-10-17: qty 1

## 2022-10-17 MED ORDER — ONDANSETRON HCL 4 MG/2ML IJ SOLN
4.0000 mg | Freq: Once | INTRAMUSCULAR | Status: AC
  Administered 2022-10-17: 4 mg via INTRAVENOUS
  Filled 2022-10-17: qty 2

## 2022-10-17 MED ORDER — LIDOCAINE 5 % EX PTCH
1.0000 | MEDICATED_PATCH | CUTANEOUS | Status: DC
  Administered 2022-10-17: 1 via TRANSDERMAL
  Filled 2022-10-17: qty 1

## 2022-10-17 MED ORDER — KETOROLAC TROMETHAMINE 15 MG/ML IJ SOLN
15.0000 mg | Freq: Once | INTRAMUSCULAR | Status: AC
  Administered 2022-10-17: 15 mg via INTRAVENOUS
  Filled 2022-10-17: qty 1

## 2022-10-17 NOTE — ED Triage Notes (Signed)
Patient here POV from Home.  Endorses Right Sided Back Pain that began this AM. Worsened over the past few hours. No known Trauma or Injury. Some CP noted as well that spans across the chest.   No SOB.  NAD Noted During Triage. A&Ox4. GCS 15. BIB Wheelchair.

## 2022-10-17 NOTE — ED Provider Notes (Signed)
[place] EMERGENCY DEPARTMENT AT [facility] Provider Note   CSN: [id number] Arrival date & time: 10/17/22  1853     History  Chief Complaint  Patient presents with   Back Pain    Chad Moreno is a 82 y.o. male.  With a history of hypertension, gout, hyperlipidemia, arthritis, left-sided sciatica who presents to the ED for evaluation of right-sided low back pain.  States this began shortly after he woke up this morning.  He denies trauma or injury.  States it has progressively gotten worse.  He has not taken anything for his pain at home.  He denies fevers or chills, dysuria, frequency, urgency, hematuria, saddle paresthesias, urinary incontinence, fecal incontinence, history of injection drug use.  His pain is localized to the right low back and does not radiate.  It is worsened with any type of movement.  He states it does not feel similar to the sciatica that he has on his left side.  Of note, she does report notes that he was complaining of some chest pain however he adamantly denies this on my examination.   Back Pain      Home Medications Prior to Admission medications   Medication Sig Start Date End Date Taking? Authorizing Provider  HYDROcodone-acetaminophen (NORCO/VICODIN) 5-325 MG tablet Take 1 tablet by mouth every 6 (six) hours as needed. 10/17/22  Yes Lirio Bach, Edsel Petrin, PA-C  amLODipine (NORVASC) 2.5 MG tablet Take 2.5 mg by mouth in the morning. 02/07/19   [provider]  Ascorbic Acid (VITAMIN C PO) Take 1 tablet by mouth daily with breakfast.    [provider]  aspirin EC 81 MG tablet Take 81 mg by mouth in the morning.    [provider]  B Complex Vitamins (VITAMIN-B COMPLEX) TABS Take 1 tablet by mouth daily with breakfast.    [provider]  Cholecalciferol (VITAMIN D3) 50 MCG (2000 UT) TABS Take 2,000 Units by mouth daily with breakfast.    [provider]  gabapentin (NEURONTIN) 100 MG capsule Take  100 mg by mouth 3 (three) times daily as needed (PAIN.). 05/31/22   [provider]  losartan (COZAAR) 100 MG tablet Take 50 mg by mouth 2 (two) times daily.  02/07/19   [provider]  meloxicam (MOBIC) 15 MG tablet Take 15 mg by mouth in the morning. 02/07/19   [provider]  naproxen sodium (ALEVE) 220 MG tablet Take 220 mg by mouth daily as needed (soreness with golfing).    [provider]  Omega-3 Fatty Acids (FISH OIL PO) Take 1 capsule by mouth daily with breakfast.    [provider]  POTASSIUM PO Take 1 tablet by mouth daily with breakfast.    [provider]  Probiotic Product (PROBIOTIC PEARLS ADVANTAGE PO) Take 1 capsule by mouth in the morning.    [provider]  silodosin (RAPAFLO) 4 MG CAPS capsule Take 1 capsule (4 mg total) by mouth daily as needed (urinary retention.). 08/27/22   Leslye Peer, MD      Allergies    Sulfa antibiotics    Review of Systems   Review of Systems  Musculoskeletal:  Positive for back pain.  All other systems reviewed and are negative.   Physical Exam Updated Vital Signs BP (!) 189/90   Pulse 72   Temp 98.1 F (36.7 C)   Resp 15   Ht 6\' 1"  (1.854 m)   Wt 105.2 kg   SpO2 97%  BMI 30.61 kg/m  Physical Exam Vitals and nursing note reviewed.  Constitutional:      General: He is not in acute distress.    Appearance: Normal appearance. He is well-developed. He is not ill-appearing, toxic-appearing or diaphoretic.     Comments: Resting comfortably in bed  HENT:     Head: Normocephalic and atraumatic.  Eyes:     Conjunctiva/sclera: Conjunctivae normal.  Cardiovascular:     Rate and Rhythm: Normal rate and regular rhythm.     Heart sounds: No murmur heard. Pulmonary:     Effort: Pulmonary effort is normal. No respiratory distress.     Breath sounds: Normal breath sounds.  Abdominal:     Palpations: Abdomen is soft.     Tenderness: There is no abdominal tenderness. There  is no guarding.  Musculoskeletal:        General: No swelling.     Cervical back: Neck supple.     Comments: No midline C, T or L-spine TTP. no step-offs, deformities or crepitus.  There is significant paraspinal TTP to the right low back..  Straight leg raise negative bilaterally.  Full active flexion range of motion of bilateral hips.  Strength 5 out of 5 in bilateral lower extremities.  Sensation intact to bilateral feet.  Skin:    General: Skin is warm and dry.     Capillary Refill: Capillary refill takes less than 2 seconds.     Comments: Small cyst to the middle of the mid thoracic back without surrounding erythema, fluctuance or drainage  Neurological:     General: No focal deficit present.     Mental Status: He is alert and oriented to person, place, and time.  Psychiatric:        Mood and Affect: Mood normal.     ED Results / Procedures / Treatments   Labs (all labs ordered are listed, but only abnormal results are displayed) Labs Reviewed  BASIC METABOLIC PANEL - Abnormal; Notable for the following components:      Result Value   Glucose, Bld 111 (*)    BUN 27 (*)    All other components within normal limits  CBC - Abnormal; Notable for the following components:   RBC 4.15 (*)    HCT 37.1 (*)    All other components within normal limits  URINALYSIS, ROUTINE W REFLEX MICROSCOPIC - Abnormal; Notable for the following components:   Protein, ur TRACE (*)    Leukocytes,Ua SMALL (*)    All other components within normal limits  TROPONIN I (HIGH SENSITIVITY)    EKG None  Radiology CT Renal Stone Study  Result Date: 10/17/2022 CLINICAL DATA:  Right-sided back pain. EXAM: CT ABDOMEN AND PELVIS WITHOUT CONTRAST TECHNIQUE: Multidetector CT imaging of the abdomen and pelvis was performed following the standard protocol without IV contrast. RADIATION DOSE REDUCTION: This exam was performed according to the departmental dose-optimization program which includes automated  exposure control, adjustment of the mA and/or kV according to patient size and/or use of iterative reconstruction technique. COMPARISON:  July 20, 2021 FINDINGS: Lower chest: A 12 mm x 12 mm posterior right lower lobe lung nodule is seen (partially imaged). This measures 16 mm x 12 mm on the prior chest CT. Hepatobiliary: No focal liver abnormality is seen. No gallstones, gallbladder wall thickening, or biliary dilatation. Pancreas: Unremarkable. No pancreatic ductal dilatation or surrounding inflammatory changes. Spleen: Normal in size without focal abnormality. Adrenals/Urinary Tract: The right adrenal gland is unremarkable. 13 mm and 14 mm  low-attenuation left adrenal masses are seen (approximately 2.17 Hounsfield units). Kidneys are normal, without renal calculi, focal lesion, or hydronephrosis. Bladder is unremarkable. Stomach/Bowel: Stomach is within normal limits. Appendix appears normal. No evidence of bowel wall thickening, distention, or inflammatory changes. Noninflamed diverticula are seen throughout the large bowel. Vascular/Lymphatic: Aortic atherosclerosis. No enlarged abdominal or pelvic lymph nodes. Reproductive: Prostate radiation implantation seeds are seen with evidence of prior SpaceOAR surgery. Other: There is a small fat containing left inguinal hernia. No abdominopelvic ascites. Musculoskeletal: Multilevel degenerative changes are seen throughout the lumbar spine. IMPRESSION: 1. 12 mm x 12 mm posterior right lower lobe lung nodule which corresponds to a hypermetabolic region on the prior nuclear medicine PET/CT (August 16, 2022) and remains concerning for bronchogenic carcinoma. 2. Low-attenuation left adrenal masses which likely represent adrenal adenomas. No follow-up imaging is recommended. This recommendation follows ACR consensus guidelines: Management of Incidental Adrenal Masses: A White Paper of the ACR Incidental Findings Committee. J Am Coll Radiol 2017;14:1038-1044. 3. Colonic  diverticulosis. 4. Prostate radiation implantation seeds with evidence of prior SpaceOAR surgery. 5. Small fat containing left inguinal hernia. 6. Aortic atherosclerosis. Aortic Atherosclerosis (ICD10-I70.0). Electronically Signed   By: Aram Candela M.D.   On: 10/17/2022 22:55   DG Chest 2 View  Result Date: 10/17/2022 CLINICAL DATA:  Chest pain, back pain EXAM: CHEST - 2 VIEW COMPARISON:  08/27/2022 FINDINGS: Lungs are well expanded, symmetric, and clear. No pneumothorax or pleural effusion. Cardiac size within normal limits. Pulmonary vascularity is normal. Osseous structures are age-appropriate. No acute bone abnormality. IMPRESSION: 1. No active cardiopulmonary disease. Electronically Signed   By: Helyn Numbers M.D.   On: 10/17/2022 20:00    Procedures Procedures    Medications Ordered in ED Medications  lidocaine (LIDODERM) 5 % 1 patch (1 patch Transdermal Patch Applied 10/17/22 2115)  ketorolac (TORADOL) 15 MG/ML injection 15 mg (15 mg Intravenous Given 10/17/22 2115)  ondansetron (ZOFRAN) injection 4 mg (4 mg Intravenous Given 10/17/22 2205)  fentaNYL (SUBLIMAZE) injection 50 mcg (50 mcg Intravenous Given 10/17/22 2301)    ED Course/ Medical Decision Making/ A&P                             Medical Decision Making Amount and/or Complexity of Data Reviewed Labs: ordered. Radiology: ordered.  Risk Prescription drug management.  This patient presents to the ED for concern of back pain, this involves an extensive number of treatment options, and is a complaint that carries with it a high risk of complications and morbidity.  The emergent differential diagnosis for back pain includes but is not limited to fracture, muscle strain, cauda equina, spinal stenosis. DDD, ankylosing spondylitis, acute ligamentous injury, disk herniation, spondylolisthesis, Epidural compression syndrome, metastatic cancer, transverse myelitis, vertebral osteomyelitis, diskitis, kidney stone, pyelonephritis,  AAA, Perforated ulcer, Retrocecal appendicitis, pancreatitis, bowel obstruction, retroperitoneal hemorrhage or mass, meningitis.  Co morbidities that complicate the patient evaluation  hypertension, gout, hyperlipidemia, arthritis, left-sided sciatica  My initial workup includes labs, pain control  Additional history obtained from: Nursing notes from this visit. Family wife is at bedside and provides a portion of the history  I ordered, reviewed and interpreted labs which include: CBC, BMP, troponin, urinalysis.  BUN of 27 with a normal creatinine.  Labs otherwise reassuring  I ordered imaging studies including chest x-ray, CT stone study I independently visualized and interpreted imaging which showed normal chest x-ray.  CT stone study reveals redemonstration of right lower  lobe lung mass, left adrenal mass, colonic diverticulosis, prostate radiation implantation seeds, small fat-containing left inguinal hernia, aortic atherosclerosis I agree with the radiologist interpretation  Afebrile, hypertensive but otherwise hemodynamically stable.  82 year old male presenting to the ED for evaluation of right-sided lower back pain.  No neurologic complaints.  No other complaints including chest pain or shortness of breath.  CT stone study did reveal findings as demonstrated above.  Likely musculoskeletal in nature.  Possibility of a bone met given his recent cancer history.  Hypertension may be a pain reaction, however he is typically hypertensive to 150-160 systolic at baseline.  This did improve some after pain control in the ED. he reported significant improvement after lidocaine and Toradol.  There was some concern for aortic dissection given his blood pressure, however believe this is less likely as he does not have any abdominal pain.  No obvious abnormality of the aorta on CT stone study.  He is following up regarding his prostate and lung cancer.  He was sent a prescription for Norco and educated  on appropriate use of this medication.  He was encouraged to use this only sparingly.  He was educated on side effects.  He was also encouraged to alternate Tylenol and ibuprofen as well as to use lidocaine patches at home.  He was given strict return precautions.  Stable at discharge.  At this time there does not appear to be any evidence of an acute emergency medical condition and the patient appears stable for discharge with appropriate outpatient follow up. Diagnosis was discussed with patient who verbalizes understanding of care plan and is agreeable to discharge. I have discussed return precautions with patient and wife who verbalizes understanding. Patient encouraged to follow-up with their PCP within 1 week. All questions answered.  Patient's case discussed with Dr. Fredderick Phenix who agrees with plan to discharge with follow-up.   Note: Portions of this report may have been transcribed using voice recognition software. Every effort was made to ensure accuracy; however, inadvertent computerized transcription errors may still be present.        Final Clinical Impression(s) / ED Diagnoses Final diagnoses:  Acute right-sided low back pain without sciatica  Uncontrolled hypertension    Rx / DC Orders ED Discharge Orders          Ordered    HYDROcodone-acetaminophen (NORCO/VICODIN) 5-325 MG tablet  Every 6 hours PRN        10/17/22 2334              Mora Bellman 10/17/22 2335    Rolan Bucco, MD 10/18/22 2218

## 2022-10-17 NOTE — ED Notes (Signed)
Patient to ct

## 2022-10-17 NOTE — Discharge Instructions (Addendum)
You have been seen today for your complaint of low back pain. Your lab work was overall reassuring. Your imaging results can be found on your mychart. They are overall reassuring and showed no acute changes. Your discharge medications include Norco. This is an opioid pain medication. You should only take this medication as needed for severe pain. You should not drive, operate heavy machinery or make important decisions while taking this medication. You should use alternative methods for pain relief while taking this medication including stretching, gentle range of motion, and alternating tylenol and ibuprofen. Lidocaine patches.  These are over-the-counter.  You may use these every 12 hours Follow up with: Your primary care provider in 1 week for reevaluation Please seek immediate medical care if you develop any of the following symptoms: You develop new bowel or bladder control problems. You have unusual weakness or numbness in your arms or legs. You feel faint. At this time there does not appear to be the presence of an emergent medical condition, however there is always the potential for conditions to change. Please read and follow the below instructions.  Do not take your medicine if  develop an itchy rash, swelling in your mouth or lips, or difficulty breathing; call 911 and seek immediate emergency medical attention if this occurs.  You may review your lab tests and imaging results in their entirety on your MyChart account.  Please discuss all results of fully with your primary care provider and other specialist at your follow-up visit.  Note: Portions of this text may have been transcribed using voice recognition software. Every effort was made to ensure accuracy; however, inadvertent computerized transcription errors may still be present.

## 2022-10-18 DIAGNOSIS — C61 Malignant neoplasm of prostate: Secondary | ICD-10-CM | POA: Diagnosis not present

## 2022-10-18 DIAGNOSIS — C7951 Secondary malignant neoplasm of bone: Secondary | ICD-10-CM | POA: Diagnosis not present

## 2022-10-18 DIAGNOSIS — Z191 Hormone sensitive malignancy status: Secondary | ICD-10-CM | POA: Diagnosis not present

## 2022-10-18 DIAGNOSIS — Z510 Encounter for antineoplastic radiation therapy: Secondary | ICD-10-CM | POA: Diagnosis not present

## 2022-10-22 ENCOUNTER — Telehealth: Payer: Self-pay

## 2022-10-22 ENCOUNTER — Ambulatory Visit
Admission: RE | Admit: 2022-10-22 | Discharge: 2022-10-22 | Disposition: A | Discharge status: Home / Self Care | Payer: Medicare Other | Source: Ambulatory Visit | Attending: Radiation Oncology | Admitting: Radiation Oncology

## 2022-10-22 ENCOUNTER — Other Ambulatory Visit: Payer: Self-pay

## 2022-10-22 DIAGNOSIS — C7951 Secondary malignant neoplasm of bone: Secondary | ICD-10-CM | POA: Diagnosis not present

## 2022-10-22 DIAGNOSIS — Z191 Hormone sensitive malignancy status: Secondary | ICD-10-CM | POA: Diagnosis not present

## 2022-10-22 DIAGNOSIS — C61 Malignant neoplasm of prostate: Secondary | ICD-10-CM | POA: Diagnosis not present

## 2022-10-22 DIAGNOSIS — Z510 Encounter for antineoplastic radiation therapy: Secondary | ICD-10-CM | POA: Diagnosis not present

## 2022-10-22 LAB — RAD ONC ARIA SESSION SUMMARY
Course Elapsed Days: 0
Plan Fractions Treated to Date: 1
Plan Prescribed Dose Per Fraction: 2.5 Gy
Plan Total Fractions Prescribed: 28
Plan Total Prescribed Dose: 70 Gy
Reference Point Dosage Given to Date: 2.5 Gy
Reference Point Session Dosage Given: 2.5 Gy
Session Number: 1

## 2022-10-22 NOTE — Telephone Encounter (Signed)
Transition Care Management Unsuccessful Follow-up Telephone Call  Date of discharge and from where:  10/17/2022 [facility].  Attempts:  1st Attempt  Reason for unsuccessful TCM follow-up call:  No answer/busy   Candace Begue [facility]  [facility] Population [facility] Community Resource Care Guide   [email or web address]  ?? [phone]   Website: [email or web address]  [email or web address]

## 2022-10-23 ENCOUNTER — Ambulatory Visit
Admission: RE | Admit: 2022-10-23 | Discharge: 2022-10-23 | Disposition: A | Discharge status: Home / Self Care | Payer: Medicare Other | Source: Ambulatory Visit | Attending: Radiation Oncology | Admitting: Radiation Oncology

## 2022-10-23 ENCOUNTER — Other Ambulatory Visit: Payer: Self-pay

## 2022-10-23 DIAGNOSIS — C7951 Secondary malignant neoplasm of bone: Secondary | ICD-10-CM | POA: Diagnosis not present

## 2022-10-23 DIAGNOSIS — Z87891 Personal history of nicotine dependence: Secondary | ICD-10-CM | POA: Diagnosis not present

## 2022-10-23 DIAGNOSIS — Z191 Hormone sensitive malignancy status: Secondary | ICD-10-CM | POA: Diagnosis not present

## 2022-10-23 DIAGNOSIS — Z510 Encounter for antineoplastic radiation therapy: Secondary | ICD-10-CM | POA: Diagnosis not present

## 2022-10-23 DIAGNOSIS — C61 Malignant neoplasm of prostate: Secondary | ICD-10-CM | POA: Diagnosis not present

## 2022-10-23 DIAGNOSIS — C3412 Malignant neoplasm of upper lobe, left bronchus or lung: Secondary | ICD-10-CM | POA: Diagnosis not present

## 2022-10-23 DIAGNOSIS — C3431 Malignant neoplasm of lower lobe, right bronchus or lung: Secondary | ICD-10-CM | POA: Diagnosis not present

## 2022-10-23 LAB — RAD ONC ARIA SESSION SUMMARY
Course Elapsed Days: 1
Plan Fractions Treated to Date: 2
Plan Prescribed Dose Per Fraction: 2.5 Gy
Plan Total Fractions Prescribed: 28
Plan Total Prescribed Dose: 70 Gy
Reference Point Dosage Given to Date: 5 Gy
Reference Point Session Dosage Given: 2.5 Gy
Session Number: 2

## 2022-10-24 ENCOUNTER — Other Ambulatory Visit: Payer: Self-pay

## 2022-10-24 ENCOUNTER — Ambulatory Visit
Admission: RE | Admit: 2022-10-24 | Discharge: 2022-10-24 | Disposition: A | Discharge status: Home / Self Care | Payer: Medicare Other | Source: Ambulatory Visit | Attending: Radiation Oncology | Admitting: Radiation Oncology

## 2022-10-24 DIAGNOSIS — C7951 Secondary malignant neoplasm of bone: Secondary | ICD-10-CM | POA: Insufficient documentation

## 2022-10-24 DIAGNOSIS — Z191 Hormone sensitive malignancy status: Secondary | ICD-10-CM | POA: Diagnosis not present

## 2022-10-24 DIAGNOSIS — Z510 Encounter for antineoplastic radiation therapy: Secondary | ICD-10-CM | POA: Diagnosis not present

## 2022-10-24 DIAGNOSIS — C61 Malignant neoplasm of prostate: Secondary | ICD-10-CM | POA: Diagnosis not present

## 2022-10-24 LAB — RAD ONC ARIA SESSION SUMMARY
Course Elapsed Days: 2
Plan Fractions Treated to Date: 3
Plan Prescribed Dose Per Fraction: 2.5 Gy
Plan Total Fractions Prescribed: 28
Plan Total Prescribed Dose: 70 Gy
Reference Point Dosage Given to Date: 7.5 Gy
Reference Point Session Dosage Given: 2.5 Gy
Session Number: 3

## 2022-10-25 ENCOUNTER — Other Ambulatory Visit: Payer: Self-pay

## 2022-10-25 ENCOUNTER — Ambulatory Visit
Admission: RE | Admit: 2022-10-25 | Discharge: 2022-10-25 | Disposition: A | Discharge status: Home / Self Care | Payer: Medicare Other | Source: Ambulatory Visit | Attending: Radiation Oncology | Admitting: Radiation Oncology

## 2022-10-25 DIAGNOSIS — C7951 Secondary malignant neoplasm of bone: Secondary | ICD-10-CM | POA: Diagnosis not present

## 2022-10-25 DIAGNOSIS — Z191 Hormone sensitive malignancy status: Secondary | ICD-10-CM | POA: Diagnosis not present

## 2022-10-25 DIAGNOSIS — Z510 Encounter for antineoplastic radiation therapy: Secondary | ICD-10-CM | POA: Diagnosis not present

## 2022-10-25 DIAGNOSIS — C61 Malignant neoplasm of prostate: Secondary | ICD-10-CM | POA: Diagnosis not present

## 2022-10-25 LAB — RAD ONC ARIA SESSION SUMMARY
Course Elapsed Days: 3
Plan Fractions Treated to Date: 4
Plan Prescribed Dose Per Fraction: 2.5 Gy
Plan Total Fractions Prescribed: 28
Plan Total Prescribed Dose: 70 Gy
Reference Point Dosage Given to Date: 10 Gy
Reference Point Session Dosage Given: 2.5 Gy
Session Number: 4

## 2022-10-26 ENCOUNTER — Ambulatory Visit
Admission: RE | Admit: 2022-10-26 | Discharge: 2022-10-26 | Disposition: A | Discharge status: Home / Self Care | Payer: Medicare Other | Source: Ambulatory Visit | Attending: Radiation Oncology | Admitting: Radiation Oncology

## 2022-10-26 ENCOUNTER — Other Ambulatory Visit: Payer: Self-pay

## 2022-10-26 DIAGNOSIS — Z191 Hormone sensitive malignancy status: Secondary | ICD-10-CM | POA: Diagnosis not present

## 2022-10-26 DIAGNOSIS — C7951 Secondary malignant neoplasm of bone: Secondary | ICD-10-CM | POA: Diagnosis not present

## 2022-10-26 DIAGNOSIS — C61 Malignant neoplasm of prostate: Secondary | ICD-10-CM | POA: Diagnosis not present

## 2022-10-26 DIAGNOSIS — Z510 Encounter for antineoplastic radiation therapy: Secondary | ICD-10-CM | POA: Diagnosis not present

## 2022-10-26 LAB — RAD ONC ARIA SESSION SUMMARY
Course Elapsed Days: 4
Plan Fractions Treated to Date: 5
Plan Prescribed Dose Per Fraction: 2.5 Gy
Plan Total Fractions Prescribed: 28
Plan Total Prescribed Dose: 70 Gy
Reference Point Dosage Given to Date: 12.5 Gy
Reference Point Session Dosage Given: 2.5 Gy
Session Number: 5

## 2022-10-26 NOTE — Progress Notes (Signed)
RN rescheduled post treatment PSA and MD follow up at [facility] due to treatment anticipation completion date of 6/6.   Patient is scheduled for post treatment PSA on 9/12 @ 8:15am, and MD follow up 9/19 @ 8:45am.    RN left voicemail for call back to provide new dates.

## 2022-10-29 ENCOUNTER — Other Ambulatory Visit: Payer: Self-pay

## 2022-10-29 ENCOUNTER — Ambulatory Visit
Admission: RE | Admit: 2022-10-29 | Discharge: 2022-10-29 | Disposition: A | Discharge status: Home / Self Care | Payer: Medicare Other | Source: Ambulatory Visit | Attending: Radiation Oncology | Admitting: Radiation Oncology

## 2022-10-29 DIAGNOSIS — Z191 Hormone sensitive malignancy status: Secondary | ICD-10-CM | POA: Diagnosis not present

## 2022-10-29 DIAGNOSIS — Z510 Encounter for antineoplastic radiation therapy: Secondary | ICD-10-CM | POA: Diagnosis not present

## 2022-10-29 DIAGNOSIS — C7951 Secondary malignant neoplasm of bone: Secondary | ICD-10-CM | POA: Diagnosis not present

## 2022-10-29 DIAGNOSIS — C61 Malignant neoplasm of prostate: Secondary | ICD-10-CM | POA: Diagnosis not present

## 2022-10-29 LAB — RAD ONC ARIA SESSION SUMMARY
Course Elapsed Days: 7
Plan Fractions Treated to Date: 6
Plan Prescribed Dose Per Fraction: 2.5 Gy
Plan Total Fractions Prescribed: 28
Plan Total Prescribed Dose: 70 Gy
Reference Point Dosage Given to Date: 15 Gy
Reference Point Session Dosage Given: 2.5 Gy
Session Number: 6

## 2022-10-30 ENCOUNTER — Other Ambulatory Visit: Payer: Self-pay

## 2022-10-30 ENCOUNTER — Ambulatory Visit
Admission: RE | Admit: 2022-10-30 | Discharge: 2022-10-30 | Disposition: A | Discharge status: Home / Self Care | Payer: Medicare Other | Source: Ambulatory Visit | Attending: Radiation Oncology | Admitting: Radiation Oncology

## 2022-10-30 DIAGNOSIS — Z510 Encounter for antineoplastic radiation therapy: Secondary | ICD-10-CM | POA: Diagnosis not present

## 2022-10-30 DIAGNOSIS — C61 Malignant neoplasm of prostate: Secondary | ICD-10-CM | POA: Diagnosis not present

## 2022-10-30 DIAGNOSIS — C7951 Secondary malignant neoplasm of bone: Secondary | ICD-10-CM | POA: Diagnosis not present

## 2022-10-30 DIAGNOSIS — Z191 Hormone sensitive malignancy status: Secondary | ICD-10-CM | POA: Diagnosis not present

## 2022-10-30 LAB — RAD ONC ARIA SESSION SUMMARY
Course Elapsed Days: 8
Plan Fractions Treated to Date: 7
Plan Prescribed Dose Per Fraction: 2.5 Gy
Plan Total Fractions Prescribed: 28
Plan Total Prescribed Dose: 70 Gy
Reference Point Dosage Given to Date: 17.5 Gy
Reference Point Session Dosage Given: 2.5 Gy
Session Number: 7

## 2022-10-30 NOTE — Progress Notes (Signed)
  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       Indication for treatment:  Curative, Definitive SBRT        Radiation treatment dates:   09/25/22 - 10/01/22   Site/dose:   The targets in the RLL and LUL were treated to 54 Gy in 3 fractions of 18 Gy each (as documented in provider EOT note)  The patient was not available for call today. Voicemail left.  The patient will follow up with the Radiation dept. PRN for ongoing care, and was encouraged to call if he develops concerns or questions regarding radiation.   Ruel Favors, LPN

## 2022-10-31 ENCOUNTER — Other Ambulatory Visit: Payer: Self-pay

## 2022-10-31 ENCOUNTER — Ambulatory Visit
Admission: RE | Admit: 2022-10-31 | Discharge: 2022-10-31 | Disposition: A | Discharge status: Home / Self Care | Payer: Medicare Other | Source: Ambulatory Visit | Attending: Radiation Oncology | Admitting: Radiation Oncology

## 2022-10-31 DIAGNOSIS — C61 Malignant neoplasm of prostate: Secondary | ICD-10-CM | POA: Diagnosis not present

## 2022-10-31 DIAGNOSIS — Z510 Encounter for antineoplastic radiation therapy: Secondary | ICD-10-CM | POA: Diagnosis not present

## 2022-10-31 DIAGNOSIS — Z191 Hormone sensitive malignancy status: Secondary | ICD-10-CM | POA: Diagnosis not present

## 2022-10-31 DIAGNOSIS — C7951 Secondary malignant neoplasm of bone: Secondary | ICD-10-CM | POA: Diagnosis not present

## 2022-10-31 LAB — RAD ONC ARIA SESSION SUMMARY
Course Elapsed Days: 9
Plan Fractions Treated to Date: 8
Plan Prescribed Dose Per Fraction: 2.5 Gy
Plan Total Fractions Prescribed: 28
Plan Total Prescribed Dose: 70 Gy
Reference Point Dosage Given to Date: 20 Gy
Reference Point Session Dosage Given: 2.5 Gy
Session Number: 8

## 2022-11-01 ENCOUNTER — Ambulatory Visit
Admission: RE | Admit: 2022-11-01 | Discharge: 2022-11-01 | Disposition: A | Discharge status: Home / Self Care | Payer: Medicare Other | Source: Ambulatory Visit | Attending: Radiation Oncology | Admitting: Radiation Oncology

## 2022-11-01 ENCOUNTER — Other Ambulatory Visit: Payer: Self-pay | Admitting: Radiation Oncology

## 2022-11-01 ENCOUNTER — Other Ambulatory Visit: Payer: Self-pay

## 2022-11-01 DIAGNOSIS — C7951 Secondary malignant neoplasm of bone: Secondary | ICD-10-CM | POA: Diagnosis not present

## 2022-11-01 DIAGNOSIS — C61 Malignant neoplasm of prostate: Secondary | ICD-10-CM | POA: Diagnosis not present

## 2022-11-01 DIAGNOSIS — Z510 Encounter for antineoplastic radiation therapy: Secondary | ICD-10-CM | POA: Diagnosis not present

## 2022-11-01 DIAGNOSIS — Z191 Hormone sensitive malignancy status: Secondary | ICD-10-CM | POA: Diagnosis not present

## 2022-11-01 LAB — RAD ONC ARIA SESSION SUMMARY
Course Elapsed Days: 10
Plan Fractions Treated to Date: 9
Plan Prescribed Dose Per Fraction: 2.5 Gy
Plan Total Fractions Prescribed: 28
Plan Total Prescribed Dose: 70 Gy
Reference Point Dosage Given to Date: 22.5 Gy
Reference Point Session Dosage Given: 2.5 Gy
Session Number: 9

## 2022-11-01 MED ORDER — MEGESTROL ACETATE 20 MG PO TABS: 20.0000 mg | ORAL_TABLET | Freq: Every day | ORAL | 2 refills | Status: DC

## 2022-11-02 ENCOUNTER — Other Ambulatory Visit: Payer: Self-pay

## 2022-11-02 ENCOUNTER — Ambulatory Visit
Admission: RE | Admit: 2022-11-02 | Discharge: 2022-11-02 | Disposition: A | Discharge status: Home / Self Care | Payer: Medicare Other | Source: Ambulatory Visit | Attending: Radiation Oncology | Admitting: Radiation Oncology

## 2022-11-02 DIAGNOSIS — C7951 Secondary malignant neoplasm of bone: Secondary | ICD-10-CM | POA: Diagnosis not present

## 2022-11-02 DIAGNOSIS — Z510 Encounter for antineoplastic radiation therapy: Secondary | ICD-10-CM | POA: Diagnosis not present

## 2022-11-02 DIAGNOSIS — C61 Malignant neoplasm of prostate: Secondary | ICD-10-CM | POA: Diagnosis not present

## 2022-11-02 DIAGNOSIS — Z191 Hormone sensitive malignancy status: Secondary | ICD-10-CM | POA: Diagnosis not present

## 2022-11-02 LAB — RAD ONC ARIA SESSION SUMMARY
Course Elapsed Days: 11
Plan Fractions Treated to Date: 10
Plan Prescribed Dose Per Fraction: 2.5 Gy
Plan Total Fractions Prescribed: 28
Plan Total Prescribed Dose: 70 Gy
Reference Point Dosage Given to Date: 25 Gy
Reference Point Session Dosage Given: 2.5 Gy
Session Number: 10

## 2022-11-05 ENCOUNTER — Other Ambulatory Visit: Payer: Self-pay

## 2022-11-05 ENCOUNTER — Ambulatory Visit
Admission: RE | Admit: 2022-11-05 | Discharge: 2022-11-05 | Disposition: A | Discharge status: Home / Self Care | Payer: Medicare Other | Source: Ambulatory Visit | Attending: Radiation Oncology | Admitting: Radiation Oncology

## 2022-11-05 DIAGNOSIS — Z510 Encounter for antineoplastic radiation therapy: Secondary | ICD-10-CM | POA: Diagnosis not present

## 2022-11-05 DIAGNOSIS — Z191 Hormone sensitive malignancy status: Secondary | ICD-10-CM | POA: Diagnosis not present

## 2022-11-05 DIAGNOSIS — C61 Malignant neoplasm of prostate: Secondary | ICD-10-CM | POA: Diagnosis not present

## 2022-11-05 DIAGNOSIS — C7951 Secondary malignant neoplasm of bone: Secondary | ICD-10-CM | POA: Diagnosis not present

## 2022-11-05 LAB — RAD ONC ARIA SESSION SUMMARY
Course Elapsed Days: 14
Plan Fractions Treated to Date: 11
Plan Prescribed Dose Per Fraction: 2.5 Gy
Plan Total Fractions Prescribed: 28
Plan Total Prescribed Dose: 70 Gy
Reference Point Dosage Given to Date: 27.5 Gy
Reference Point Session Dosage Given: 2.5 Gy
Session Number: 11

## 2022-11-06 ENCOUNTER — Ambulatory Visit
Admission: RE | Admit: 2022-11-06 | Discharge: 2022-11-06 | Disposition: A | Discharge status: Home / Self Care | Payer: Medicare Other | Source: Ambulatory Visit | Attending: Radiation Oncology | Admitting: Radiation Oncology

## 2022-11-06 ENCOUNTER — Other Ambulatory Visit: Payer: Self-pay

## 2022-11-06 DIAGNOSIS — C7951 Secondary malignant neoplasm of bone: Secondary | ICD-10-CM | POA: Diagnosis not present

## 2022-11-06 DIAGNOSIS — Z510 Encounter for antineoplastic radiation therapy: Secondary | ICD-10-CM | POA: Diagnosis not present

## 2022-11-06 DIAGNOSIS — Z191 Hormone sensitive malignancy status: Secondary | ICD-10-CM | POA: Diagnosis not present

## 2022-11-06 DIAGNOSIS — C61 Malignant neoplasm of prostate: Secondary | ICD-10-CM | POA: Diagnosis not present

## 2022-11-06 LAB — RAD ONC ARIA SESSION SUMMARY
Course Elapsed Days: 15
Plan Fractions Treated to Date: 12
Plan Prescribed Dose Per Fraction: 2.5 Gy
Plan Total Fractions Prescribed: 28
Plan Total Prescribed Dose: 70 Gy
Reference Point Dosage Given to Date: 30 Gy
Reference Point Session Dosage Given: 2.5 Gy
Session Number: 12

## 2022-11-07 ENCOUNTER — Other Ambulatory Visit: Payer: Self-pay

## 2022-11-07 ENCOUNTER — Ambulatory Visit
Admission: RE | Admit: 2022-11-07 | Discharge: 2022-11-07 | Disposition: A | Discharge status: Home / Self Care | Payer: Medicare Other | Source: Ambulatory Visit | Attending: Radiation Oncology | Admitting: Radiation Oncology

## 2022-11-07 DIAGNOSIS — C61 Malignant neoplasm of prostate: Secondary | ICD-10-CM | POA: Diagnosis not present

## 2022-11-07 DIAGNOSIS — Z510 Encounter for antineoplastic radiation therapy: Secondary | ICD-10-CM | POA: Diagnosis not present

## 2022-11-07 DIAGNOSIS — C7951 Secondary malignant neoplasm of bone: Secondary | ICD-10-CM | POA: Diagnosis not present

## 2022-11-07 DIAGNOSIS — Z191 Hormone sensitive malignancy status: Secondary | ICD-10-CM | POA: Diagnosis not present

## 2022-11-07 LAB — RAD ONC ARIA SESSION SUMMARY
Course Elapsed Days: 16
Plan Fractions Treated to Date: 13
Plan Prescribed Dose Per Fraction: 2.5 Gy
Plan Total Fractions Prescribed: 28
Plan Total Prescribed Dose: 70 Gy
Reference Point Dosage Given to Date: 32.5 Gy
Reference Point Session Dosage Given: 2.5 Gy
Session Number: 13

## 2022-11-08 ENCOUNTER — Ambulatory Visit
Admission: RE | Admit: 2022-11-08 | Discharge: 2022-11-08 | Disposition: A | Discharge status: Home / Self Care | Payer: Medicare Other | Source: Ambulatory Visit | Attending: Radiation Oncology | Admitting: Radiation Oncology

## 2022-11-08 ENCOUNTER — Other Ambulatory Visit: Payer: Self-pay

## 2022-11-08 DIAGNOSIS — C7951 Secondary malignant neoplasm of bone: Secondary | ICD-10-CM | POA: Diagnosis not present

## 2022-11-08 DIAGNOSIS — Z191 Hormone sensitive malignancy status: Secondary | ICD-10-CM | POA: Diagnosis not present

## 2022-11-08 DIAGNOSIS — C61 Malignant neoplasm of prostate: Secondary | ICD-10-CM | POA: Diagnosis not present

## 2022-11-08 DIAGNOSIS — Z510 Encounter for antineoplastic radiation therapy: Secondary | ICD-10-CM | POA: Diagnosis not present

## 2022-11-08 LAB — RAD ONC ARIA SESSION SUMMARY
Course Elapsed Days: 17
Plan Fractions Treated to Date: 14
Plan Prescribed Dose Per Fraction: 2.5 Gy
Plan Total Fractions Prescribed: 28
Plan Total Prescribed Dose: 70 Gy
Reference Point Dosage Given to Date: 35 Gy
Reference Point Session Dosage Given: 2.5 Gy
Session Number: 14

## 2022-11-09 ENCOUNTER — Other Ambulatory Visit: Payer: Self-pay

## 2022-11-09 ENCOUNTER — Ambulatory Visit
Admission: RE | Admit: 2022-11-09 | Discharge: 2022-11-09 | Disposition: A | Discharge status: Home / Self Care | Payer: Medicare Other | Source: Ambulatory Visit | Attending: Radiation Oncology | Admitting: Radiation Oncology

## 2022-11-09 DIAGNOSIS — C61 Malignant neoplasm of prostate: Secondary | ICD-10-CM | POA: Diagnosis not present

## 2022-11-09 DIAGNOSIS — Z191 Hormone sensitive malignancy status: Secondary | ICD-10-CM | POA: Diagnosis not present

## 2022-11-09 DIAGNOSIS — Z510 Encounter for antineoplastic radiation therapy: Secondary | ICD-10-CM | POA: Diagnosis not present

## 2022-11-09 DIAGNOSIS — C7951 Secondary malignant neoplasm of bone: Secondary | ICD-10-CM | POA: Diagnosis not present

## 2022-11-09 LAB — RAD ONC ARIA SESSION SUMMARY
Course Elapsed Days: 18
Plan Fractions Treated to Date: 15
Plan Prescribed Dose Per Fraction: 2.5 Gy
Plan Total Fractions Prescribed: 28
Plan Total Prescribed Dose: 70 Gy
Reference Point Dosage Given to Date: 37.5 Gy
Reference Point Session Dosage Given: 2.5 Gy
Session Number: 15

## 2022-11-12 ENCOUNTER — Other Ambulatory Visit: Payer: Self-pay

## 2022-11-12 ENCOUNTER — Ambulatory Visit
Admission: RE | Admit: 2022-11-12 | Discharge: 2022-11-12 | Disposition: A | Discharge status: Home / Self Care | Payer: Medicare Other | Source: Ambulatory Visit | Attending: Radiation Oncology | Admitting: Radiation Oncology

## 2022-11-12 DIAGNOSIS — C7951 Secondary malignant neoplasm of bone: Secondary | ICD-10-CM | POA: Diagnosis not present

## 2022-11-12 DIAGNOSIS — Z191 Hormone sensitive malignancy status: Secondary | ICD-10-CM | POA: Diagnosis not present

## 2022-11-12 DIAGNOSIS — C61 Malignant neoplasm of prostate: Secondary | ICD-10-CM | POA: Diagnosis not present

## 2022-11-12 DIAGNOSIS — Z510 Encounter for antineoplastic radiation therapy: Secondary | ICD-10-CM | POA: Diagnosis not present

## 2022-11-12 LAB — RAD ONC ARIA SESSION SUMMARY
Course Elapsed Days: 21
Plan Fractions Treated to Date: 16
Plan Prescribed Dose Per Fraction: 2.5 Gy
Plan Total Fractions Prescribed: 28
Plan Total Prescribed Dose: 70 Gy
Reference Point Dosage Given to Date: 40 Gy
Reference Point Session Dosage Given: 2.5 Gy
Session Number: 16

## 2022-11-13 ENCOUNTER — Other Ambulatory Visit: Payer: Self-pay

## 2022-11-13 ENCOUNTER — Ambulatory Visit
Admission: RE | Admit: 2022-11-13 | Discharge: 2022-11-13 | Disposition: A | Discharge status: Home / Self Care | Payer: Medicare Other | Source: Ambulatory Visit | Attending: Radiation Oncology | Admitting: Radiation Oncology

## 2022-11-13 DIAGNOSIS — Z191 Hormone sensitive malignancy status: Secondary | ICD-10-CM | POA: Diagnosis not present

## 2022-11-13 DIAGNOSIS — C61 Malignant neoplasm of prostate: Secondary | ICD-10-CM | POA: Diagnosis not present

## 2022-11-13 DIAGNOSIS — C7951 Secondary malignant neoplasm of bone: Secondary | ICD-10-CM | POA: Diagnosis not present

## 2022-11-13 DIAGNOSIS — Z510 Encounter for antineoplastic radiation therapy: Secondary | ICD-10-CM | POA: Diagnosis not present

## 2022-11-13 LAB — RAD ONC ARIA SESSION SUMMARY
Course Elapsed Days: 22
Plan Fractions Treated to Date: 17
Plan Prescribed Dose Per Fraction: 2.5 Gy
Plan Total Fractions Prescribed: 28
Plan Total Prescribed Dose: 70 Gy
Reference Point Dosage Given to Date: 42.5 Gy
Reference Point Session Dosage Given: 2.5 Gy
Session Number: 17

## 2022-11-14 ENCOUNTER — Ambulatory Visit
Admission: RE | Admit: 2022-11-14 | Discharge: 2022-11-14 | Disposition: A | Discharge status: Home / Self Care | Payer: Medicare Other | Source: Ambulatory Visit | Attending: Radiation Oncology | Admitting: Radiation Oncology

## 2022-11-14 ENCOUNTER — Other Ambulatory Visit: Payer: Self-pay

## 2022-11-14 DIAGNOSIS — Z191 Hormone sensitive malignancy status: Secondary | ICD-10-CM | POA: Diagnosis not present

## 2022-11-14 DIAGNOSIS — C7951 Secondary malignant neoplasm of bone: Secondary | ICD-10-CM | POA: Diagnosis not present

## 2022-11-14 DIAGNOSIS — Z510 Encounter for antineoplastic radiation therapy: Secondary | ICD-10-CM | POA: Diagnosis not present

## 2022-11-14 DIAGNOSIS — C61 Malignant neoplasm of prostate: Secondary | ICD-10-CM | POA: Diagnosis not present

## 2022-11-14 LAB — RAD ONC ARIA SESSION SUMMARY
Course Elapsed Days: 23
Plan Fractions Treated to Date: 18
Plan Prescribed Dose Per Fraction: 2.5 Gy
Plan Total Fractions Prescribed: 28
Plan Total Prescribed Dose: 70 Gy
Reference Point Dosage Given to Date: 45 Gy
Reference Point Session Dosage Given: 2.5 Gy
Session Number: 18

## 2022-11-15 ENCOUNTER — Ambulatory Visit
Admission: RE | Admit: 2022-11-15 | Discharge: 2022-11-15 | Disposition: A | Discharge status: Home / Self Care | Payer: Medicare Other | Source: Ambulatory Visit | Attending: Radiation Oncology | Admitting: Radiation Oncology

## 2022-11-15 ENCOUNTER — Other Ambulatory Visit: Payer: Self-pay

## 2022-11-15 DIAGNOSIS — Z510 Encounter for antineoplastic radiation therapy: Secondary | ICD-10-CM | POA: Diagnosis not present

## 2022-11-15 DIAGNOSIS — Z191 Hormone sensitive malignancy status: Secondary | ICD-10-CM | POA: Diagnosis not present

## 2022-11-15 DIAGNOSIS — C61 Malignant neoplasm of prostate: Secondary | ICD-10-CM | POA: Diagnosis not present

## 2022-11-15 DIAGNOSIS — C7951 Secondary malignant neoplasm of bone: Secondary | ICD-10-CM | POA: Diagnosis not present

## 2022-11-15 LAB — RAD ONC ARIA SESSION SUMMARY
Course Elapsed Days: 24
Plan Fractions Treated to Date: 19
Plan Prescribed Dose Per Fraction: 2.5 Gy
Plan Total Fractions Prescribed: 28
Plan Total Prescribed Dose: 70 Gy
Reference Point Dosage Given to Date: 47.5 Gy
Reference Point Session Dosage Given: 2.5 Gy
Session Number: 19

## 2022-11-16 ENCOUNTER — Other Ambulatory Visit: Payer: Self-pay

## 2022-11-16 ENCOUNTER — Ambulatory Visit
Admission: RE | Admit: 2022-11-16 | Discharge: 2022-11-16 | Disposition: A | Discharge status: Home / Self Care | Payer: Medicare Other | Source: Ambulatory Visit | Attending: Radiation Oncology | Admitting: Radiation Oncology

## 2022-11-16 DIAGNOSIS — C61 Malignant neoplasm of prostate: Secondary | ICD-10-CM | POA: Diagnosis not present

## 2022-11-16 DIAGNOSIS — Z510 Encounter for antineoplastic radiation therapy: Secondary | ICD-10-CM | POA: Diagnosis not present

## 2022-11-16 DIAGNOSIS — Z191 Hormone sensitive malignancy status: Secondary | ICD-10-CM | POA: Diagnosis not present

## 2022-11-16 DIAGNOSIS — C7951 Secondary malignant neoplasm of bone: Secondary | ICD-10-CM | POA: Diagnosis not present

## 2022-11-16 LAB — RAD ONC ARIA SESSION SUMMARY
Course Elapsed Days: 25
Plan Fractions Treated to Date: 20
Plan Prescribed Dose Per Fraction: 2.5 Gy
Plan Total Fractions Prescribed: 28
Plan Total Prescribed Dose: 70 Gy
Reference Point Dosage Given to Date: 50 Gy
Reference Point Session Dosage Given: 2.5 Gy
Session Number: 20

## 2022-11-20 ENCOUNTER — Other Ambulatory Visit: Payer: Self-pay

## 2022-11-20 ENCOUNTER — Ambulatory Visit
Admission: RE | Admit: 2022-11-20 | Discharge: 2022-11-20 | Disposition: A | Discharge status: Home / Self Care | Payer: Medicare Other | Source: Ambulatory Visit | Attending: Radiation Oncology | Admitting: Radiation Oncology

## 2022-11-20 DIAGNOSIS — Z191 Hormone sensitive malignancy status: Secondary | ICD-10-CM | POA: Diagnosis not present

## 2022-11-20 DIAGNOSIS — C7951 Secondary malignant neoplasm of bone: Secondary | ICD-10-CM | POA: Diagnosis not present

## 2022-11-20 DIAGNOSIS — C61 Malignant neoplasm of prostate: Secondary | ICD-10-CM | POA: Diagnosis not present

## 2022-11-20 DIAGNOSIS — Z510 Encounter for antineoplastic radiation therapy: Secondary | ICD-10-CM | POA: Diagnosis not present

## 2022-11-20 LAB — RAD ONC ARIA SESSION SUMMARY
Course Elapsed Days: 29
Plan Fractions Treated to Date: 21
Plan Prescribed Dose Per Fraction: 2.5 Gy
Plan Total Fractions Prescribed: 28
Plan Total Prescribed Dose: 70 Gy
Reference Point Dosage Given to Date: 52.5 Gy
Reference Point Session Dosage Given: 2.5 Gy
Session Number: 21

## 2022-11-21 ENCOUNTER — Ambulatory Visit: Payer: Medicare Other

## 2022-11-22 ENCOUNTER — Ambulatory Visit
Admission: RE | Admit: 2022-11-22 | Discharge: 2022-11-22 | Disposition: A | Discharge status: Home / Self Care | Payer: Medicare Other | Source: Ambulatory Visit | Attending: Radiation Oncology | Admitting: Radiation Oncology

## 2022-11-22 ENCOUNTER — Other Ambulatory Visit: Payer: Self-pay

## 2022-11-22 DIAGNOSIS — C7951 Secondary malignant neoplasm of bone: Secondary | ICD-10-CM | POA: Diagnosis not present

## 2022-11-22 DIAGNOSIS — Z191 Hormone sensitive malignancy status: Secondary | ICD-10-CM | POA: Diagnosis not present

## 2022-11-22 DIAGNOSIS — C61 Malignant neoplasm of prostate: Secondary | ICD-10-CM | POA: Diagnosis not present

## 2022-11-22 DIAGNOSIS — Z510 Encounter for antineoplastic radiation therapy: Secondary | ICD-10-CM | POA: Diagnosis not present

## 2022-11-22 LAB — RAD ONC ARIA SESSION SUMMARY
Course Elapsed Days: 31
Plan Fractions Treated to Date: 22
Plan Prescribed Dose Per Fraction: 2.5 Gy
Plan Total Fractions Prescribed: 28
Plan Total Prescribed Dose: 70 Gy
Reference Point Dosage Given to Date: 55 Gy
Reference Point Session Dosage Given: 2.5 Gy
Session Number: 22

## 2022-11-23 ENCOUNTER — Ambulatory Visit
Admission: RE | Admit: 2022-11-23 | Discharge: 2022-11-23 | Disposition: A | Discharge status: Home / Self Care | Payer: Medicare Other | Source: Ambulatory Visit | Attending: Radiation Oncology | Admitting: Radiation Oncology

## 2022-11-23 ENCOUNTER — Other Ambulatory Visit: Payer: Self-pay

## 2022-11-23 DIAGNOSIS — C61 Malignant neoplasm of prostate: Secondary | ICD-10-CM | POA: Diagnosis not present

## 2022-11-23 DIAGNOSIS — Z510 Encounter for antineoplastic radiation therapy: Secondary | ICD-10-CM | POA: Diagnosis not present

## 2022-11-23 DIAGNOSIS — C7951 Secondary malignant neoplasm of bone: Secondary | ICD-10-CM | POA: Diagnosis not present

## 2022-11-23 DIAGNOSIS — Z191 Hormone sensitive malignancy status: Secondary | ICD-10-CM | POA: Diagnosis not present

## 2022-11-23 LAB — RAD ONC ARIA SESSION SUMMARY
Course Elapsed Days: 32
Plan Fractions Treated to Date: 23
Plan Prescribed Dose Per Fraction: 2.5 Gy
Plan Total Fractions Prescribed: 28
Plan Total Prescribed Dose: 70 Gy
Reference Point Dosage Given to Date: 57.5 Gy
Reference Point Session Dosage Given: 2.5 Gy
Session Number: 23

## 2022-11-26 ENCOUNTER — Ambulatory Visit
Admission: RE | Admit: 2022-11-26 | Discharge: 2022-11-26 | Disposition: A | Discharge status: Home / Self Care | Payer: Medicare Other | Source: Ambulatory Visit | Attending: Radiation Oncology | Admitting: Radiation Oncology

## 2022-11-26 ENCOUNTER — Other Ambulatory Visit: Payer: Self-pay

## 2022-11-26 DIAGNOSIS — Z191 Hormone sensitive malignancy status: Secondary | ICD-10-CM | POA: Diagnosis not present

## 2022-11-26 DIAGNOSIS — C61 Malignant neoplasm of prostate: Secondary | ICD-10-CM | POA: Insufficient documentation

## 2022-11-26 DIAGNOSIS — Z510 Encounter for antineoplastic radiation therapy: Secondary | ICD-10-CM | POA: Diagnosis not present

## 2022-11-26 DIAGNOSIS — C7951 Secondary malignant neoplasm of bone: Secondary | ICD-10-CM | POA: Diagnosis not present

## 2022-11-26 LAB — RAD ONC ARIA SESSION SUMMARY
Course Elapsed Days: 35
Plan Fractions Treated to Date: 24
Plan Prescribed Dose Per Fraction: 2.5 Gy
Plan Total Fractions Prescribed: 28
Plan Total Prescribed Dose: 70 Gy
Reference Point Dosage Given to Date: 60 Gy
Reference Point Session Dosage Given: 2.5 Gy
Session Number: 24

## 2022-11-27 ENCOUNTER — Ambulatory Visit
Admission: RE | Admit: 2022-11-27 | Discharge: 2022-11-27 | Disposition: A | Discharge status: Home / Self Care | Payer: Medicare Other | Source: Ambulatory Visit | Attending: Radiation Oncology | Admitting: Radiation Oncology

## 2022-11-27 ENCOUNTER — Other Ambulatory Visit: Payer: Self-pay

## 2022-11-27 DIAGNOSIS — C7951 Secondary malignant neoplasm of bone: Secondary | ICD-10-CM | POA: Diagnosis not present

## 2022-11-27 DIAGNOSIS — Z191 Hormone sensitive malignancy status: Secondary | ICD-10-CM | POA: Diagnosis not present

## 2022-11-27 DIAGNOSIS — Z510 Encounter for antineoplastic radiation therapy: Secondary | ICD-10-CM | POA: Diagnosis not present

## 2022-11-27 DIAGNOSIS — C61 Malignant neoplasm of prostate: Secondary | ICD-10-CM | POA: Diagnosis not present

## 2022-11-27 LAB — RAD ONC ARIA SESSION SUMMARY
Course Elapsed Days: 36
Plan Fractions Treated to Date: 25
Plan Prescribed Dose Per Fraction: 2.5 Gy
Plan Total Fractions Prescribed: 28
Plan Total Prescribed Dose: 70 Gy
Reference Point Dosage Given to Date: 62.5 Gy
Reference Point Session Dosage Given: 2.5 Gy
Session Number: 25

## 2022-11-28 ENCOUNTER — Other Ambulatory Visit: Payer: Self-pay

## 2022-11-28 ENCOUNTER — Ambulatory Visit
Admission: RE | Admit: 2022-11-28 | Discharge: 2022-11-28 | Disposition: A | Discharge status: Home / Self Care | Payer: Medicare Other | Source: Ambulatory Visit | Attending: Radiation Oncology | Admitting: Radiation Oncology

## 2022-11-28 DIAGNOSIS — C7951 Secondary malignant neoplasm of bone: Secondary | ICD-10-CM | POA: Diagnosis not present

## 2022-11-28 DIAGNOSIS — Z510 Encounter for antineoplastic radiation therapy: Secondary | ICD-10-CM | POA: Diagnosis not present

## 2022-11-28 DIAGNOSIS — C61 Malignant neoplasm of prostate: Secondary | ICD-10-CM | POA: Diagnosis not present

## 2022-11-28 DIAGNOSIS — Z191 Hormone sensitive malignancy status: Secondary | ICD-10-CM | POA: Diagnosis not present

## 2022-11-28 LAB — RAD ONC ARIA SESSION SUMMARY
Course Elapsed Days: 37
Plan Fractions Treated to Date: 26
Plan Prescribed Dose Per Fraction: 2.5 Gy
Plan Total Fractions Prescribed: 28
Plan Total Prescribed Dose: 70 Gy
Reference Point Dosage Given to Date: 65 Gy
Reference Point Session Dosage Given: 0.8363 Gy
Session Number: 26

## 2022-11-29 ENCOUNTER — Other Ambulatory Visit: Payer: Self-pay

## 2022-11-29 ENCOUNTER — Ambulatory Visit: Payer: Medicare Other

## 2022-11-29 ENCOUNTER — Ambulatory Visit
Admission: RE | Admit: 2022-11-29 | Discharge: 2022-11-29 | Disposition: A | Discharge status: Home / Self Care | Payer: Medicare Other | Source: Ambulatory Visit | Attending: Radiation Oncology | Admitting: Radiation Oncology

## 2022-11-29 DIAGNOSIS — C61 Malignant neoplasm of prostate: Secondary | ICD-10-CM | POA: Diagnosis not present

## 2022-11-29 DIAGNOSIS — C7951 Secondary malignant neoplasm of bone: Secondary | ICD-10-CM | POA: Diagnosis not present

## 2022-11-29 DIAGNOSIS — Z191 Hormone sensitive malignancy status: Secondary | ICD-10-CM | POA: Diagnosis not present

## 2022-11-29 DIAGNOSIS — Z510 Encounter for antineoplastic radiation therapy: Secondary | ICD-10-CM | POA: Diagnosis not present

## 2022-11-29 LAB — RAD ONC ARIA SESSION SUMMARY
Course Elapsed Days: 38
Plan Fractions Treated to Date: 27
Plan Prescribed Dose Per Fraction: 2.5 Gy
Plan Total Fractions Prescribed: 28
Plan Total Prescribed Dose: 70 Gy
Reference Point Dosage Given to Date: 67.5 Gy
Reference Point Session Dosage Given: 2.5 Gy
Session Number: 27

## 2022-11-30 ENCOUNTER — Other Ambulatory Visit: Payer: Self-pay

## 2022-11-30 ENCOUNTER — Ambulatory Visit
Admission: RE | Admit: 2022-11-30 | Discharge: 2022-11-30 | Disposition: A | Discharge status: Home / Self Care | Payer: Medicare Other | Source: Ambulatory Visit | Attending: Radiation Oncology | Admitting: Radiation Oncology

## 2022-11-30 DIAGNOSIS — C7951 Secondary malignant neoplasm of bone: Secondary | ICD-10-CM | POA: Diagnosis not present

## 2022-11-30 DIAGNOSIS — C61 Malignant neoplasm of prostate: Secondary | ICD-10-CM | POA: Diagnosis not present

## 2022-11-30 DIAGNOSIS — Z510 Encounter for antineoplastic radiation therapy: Secondary | ICD-10-CM | POA: Diagnosis not present

## 2022-11-30 DIAGNOSIS — Z191 Hormone sensitive malignancy status: Secondary | ICD-10-CM | POA: Diagnosis not present

## 2022-11-30 LAB — RAD ONC ARIA SESSION SUMMARY
Course Elapsed Days: 39
Plan Fractions Treated to Date: 28
Plan Prescribed Dose Per Fraction: 2.5 Gy
Plan Total Fractions Prescribed: 28
Plan Total Prescribed Dose: 70 Gy
Reference Point Dosage Given to Date: 70 Gy
Reference Point Session Dosage Given: 2.5 Gy
Session Number: 28

## 2022-11-30 IMAGING — CT CT VIRTUAL COLONOSCOPY DIAGNOSTIC
3 of 13 series · 11 of 46 positions shown, 17 images · non-contrast
Comparison: None

CLINICAL DATA: Incomplete colonoscopy. Unable to reach cecum due to
tortuosity. Colon polyps.

EXAM:
CT VIRTUAL COLONOSCOPY DIAGNOSTIC
TECHNIQUE: The patient was given a standard bowel preparation with Gastrografin
and barium for fluid and stool tagging respectively. The quality of
the bowel preparation is fair. Automated CO2 insufflation of the
colon was performed prior to image acquisition and colonic
distention is 4. Image post processing was used to generate a 3D
endoluminal fly-through projection of the colon and to
electronically subtract stool/fluid as appropriate.

[Series 6: supine colon 1.50 br40 s3 supine thins · axial · 0.90mm/px · z∈[+1270,+1618]mm · 4 of 388 slices shown]
[im 78/388  soft-tissue]
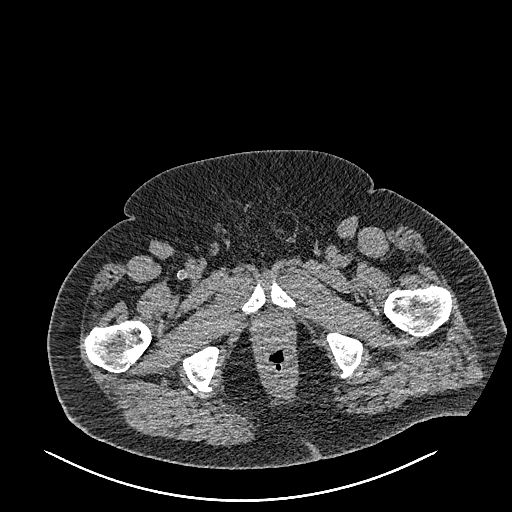
[im 155/388  soft-tissue]
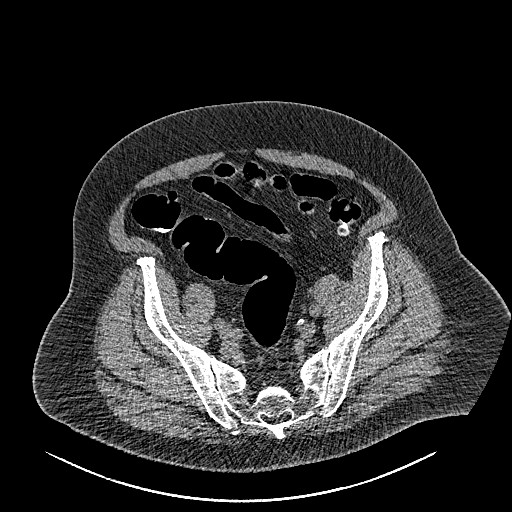
[im 233/388  soft-tissue]
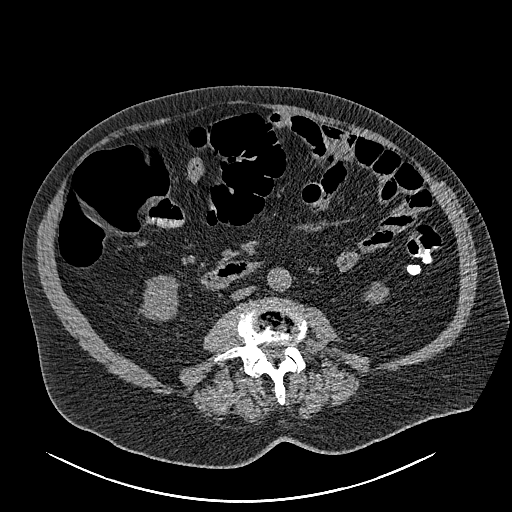
[im 310/388  soft-tissue]
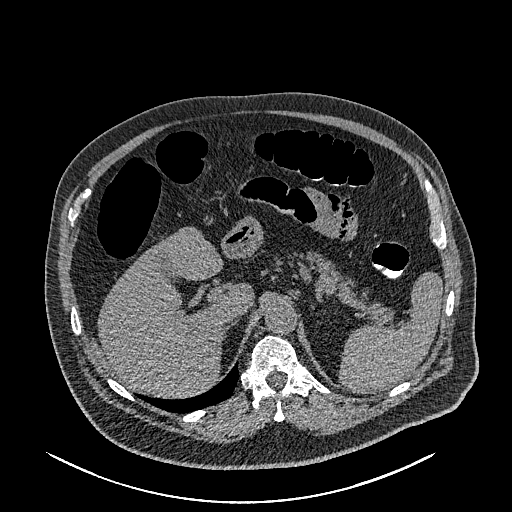

[Series 19: rt decub colon 1.50 br40 s3 rt decub thin · axial · 0.90mm/px · z∈[+1229,+1619]mm · 5 of 392 slices shown, 10 images]
[im 66/392  soft-tissue]
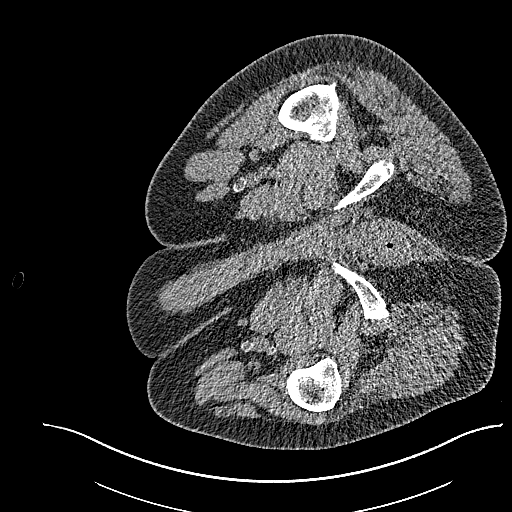
[im 66/392  bone]
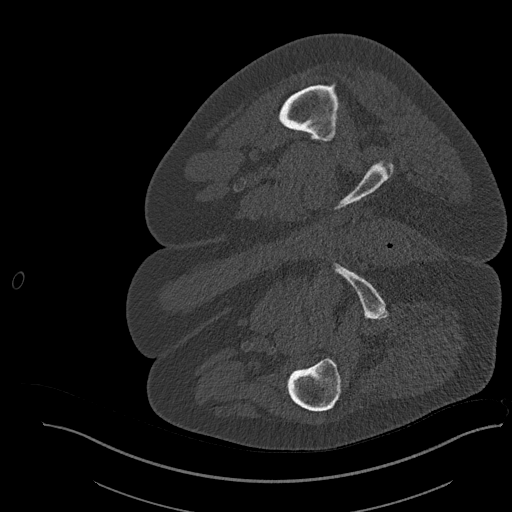
[im 131/392  soft-tissue]
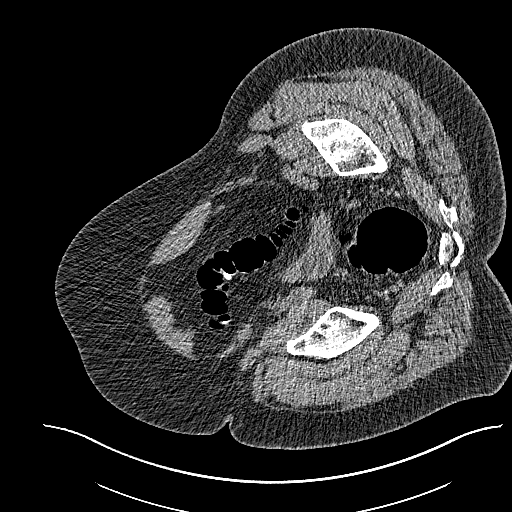
[im 131/392  lung]
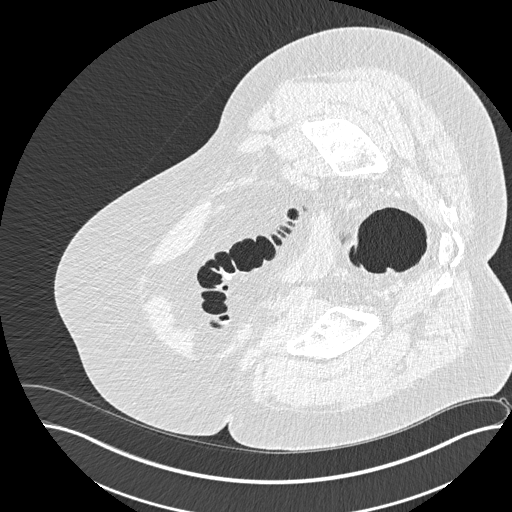
[im 196/392  soft-tissue]
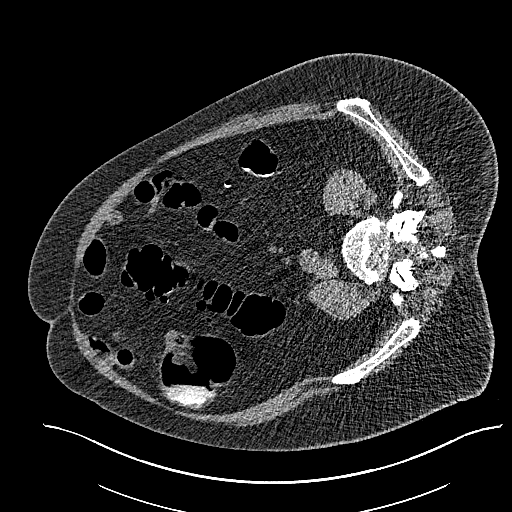
[im 196/392  lung]
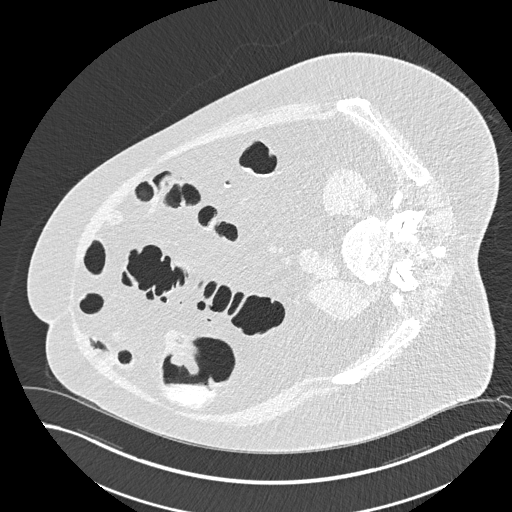
[im 261/392  soft-tissue]
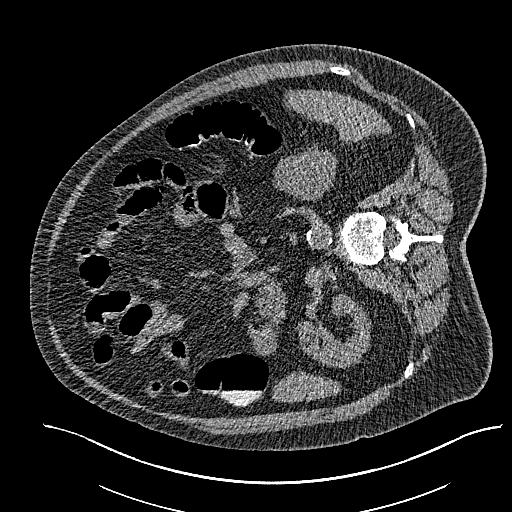
[im 261/392  lung]
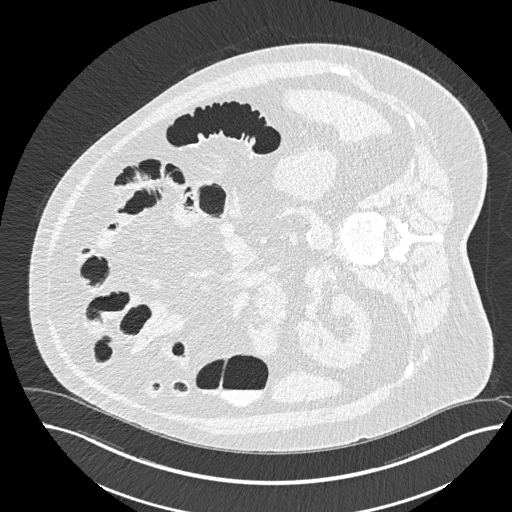
[im 326/392  soft-tissue]
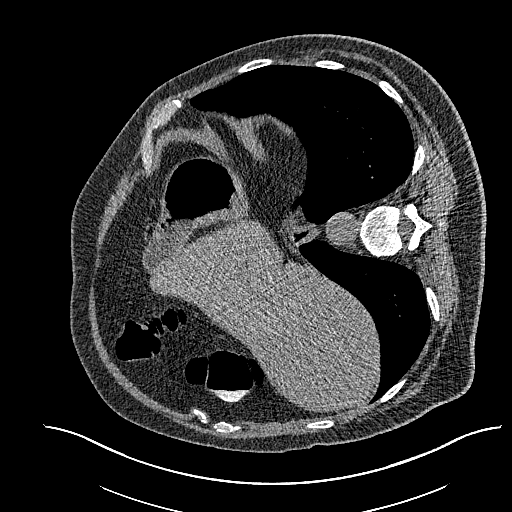
[im 326/392  lung]
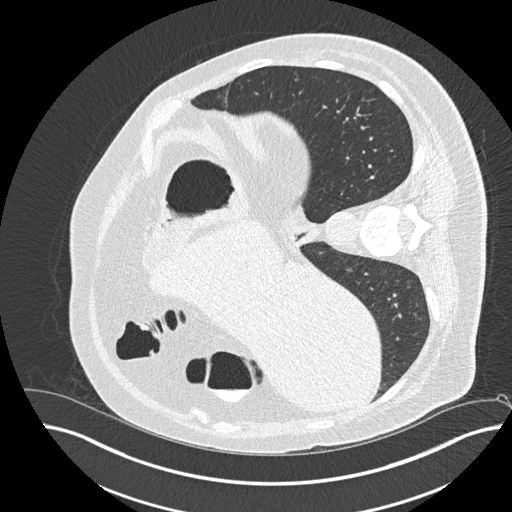

[Series 21: rt decub colon 3.00 br40 s3 cor rt decub · coronal · 0.84mm/px · 2 of 159 slices shown, 3 images]
[im 53/159  soft-tissue]
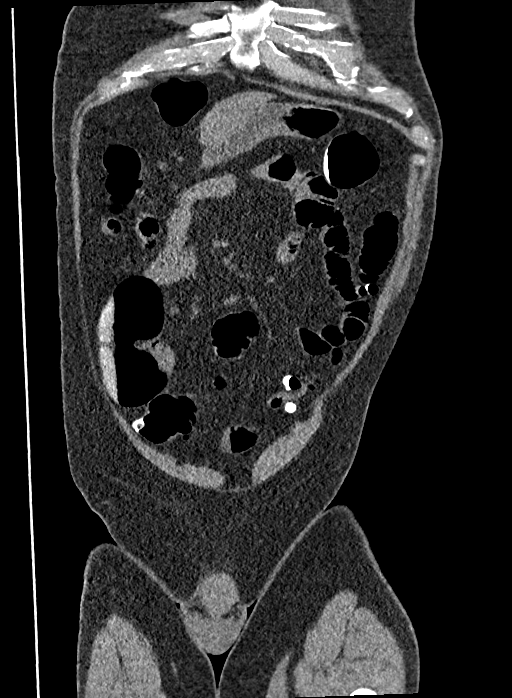
[im 53/159  bone]
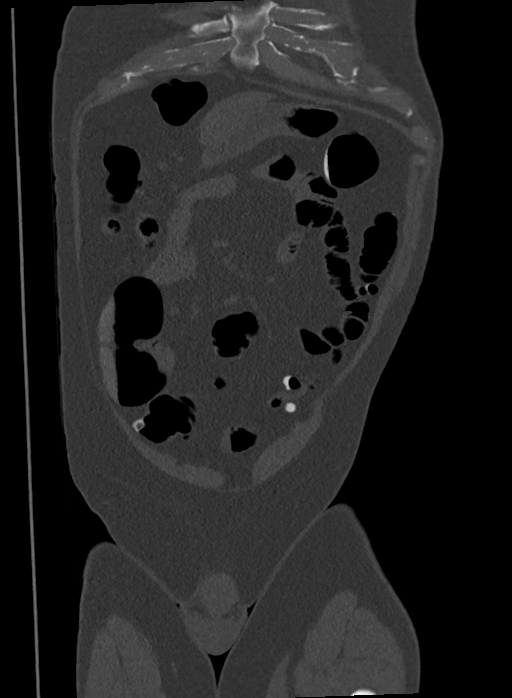
[im 106/159  soft-tissue]
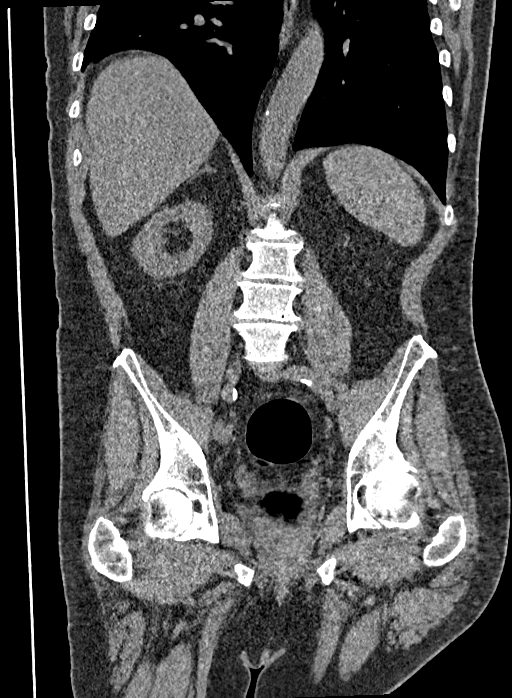

[11 of 46 positions shown; findings below may reference images not displayed]

FINDINGS: VIRTUAL COLONOSCOPY

Moderate retained barium and stool throughout the colon. Colonic
diverticulosis diffusely, most pronounced in the sigmoid colon.
There is tortuosity of the colon. Under distention of the sigmoid
colon and descending colon on both supine and prone images limited
evaluation. No visible non barium tagged fixed polypoid filling
defects or annular constricting lesions.

Virtual colonoscopy is not designed to detect diminutive polyps
(i.e., less than or equal to 5 mm), the presence or absence of which
may not affect clinical management.

CT ABDOMEN AND PELVIS WITHOUT CONTRAST

Lower chest: No acute abnormality

Hepatobiliary: No focal hepatic abnormality. Gallbladder
unremarkable.

Pancreas: No focal abnormality or ductal dilatation.

Spleen: No focal abnormality.  Normal size.

Adrenals/Urinary Tract: No adrenal abnormality. No focal renal
abnormality. No stones or hydronephrosis. Urinary bladder is
unremarkable.

Stomach/Bowel: Stomach and small bowel decompressed, grossly
unremarkable.

Vascular/Lymphatic: Aortic atherosclerosis. No evidence of aneurysm
or adenopathy.

Reproductive: No visible focal abnormality.

Other: No free fluid or free air.

Musculoskeletal: Degenerative changes in the lumbar spine. No acute
bony abnormality.
IMPRESSION: Moderate retained barium and stool throughout the colon. Under
distention of the sigmoid colon and descending colon on both supine
and prone imaging limiting evaluation. No visible fixed non barium
tagged polypoid filling defects or annular constricting lesions.

Colonic diverticulosis and tortuosity.

Aortic atherosclerosis.

No acute extra colonic abnormality.

## 2022-12-05 NOTE — Radiation Completion Notes (Addendum)
  Radiation Oncology         (336) [phone] ________________________________  Name: [name] MRN: [id number]  Date: 11/30/2022  DOB: [birth year]  End of Treatment Note  Patient Name: Chad Moreno, Chad Moreno MRN: [id number] Date of Birth: [birth year] Referring Physician: Rhoderick Moody, M.D. Date of Service: 2022-12-05 Radiation Oncologist: Margaretmary Bayley, M.D. [facility]     RADIATION ONCOLOGY END OF TREATMENT NOTE     Diagnosis: 82 y.o. 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   Intent: Curative     ==========DELIVERED PLANS==========  First Treatment Date: 2022-10-22 - Last Treatment Date: 2022-11-30   Plan Name: Prostate Site: Prostate Technique: IMRT Mode: Photon Dose Per Fraction: 2.5 Gy Prescribed Dose (Delivered / Prescribed): 70 Gy / 70 Gy Prescribed Fxs (Delivered / Prescribed): 28 / 28     ==========ON TREATMENT VISIT DATES========== 2022-10-25, 2022-11-01, 2022-11-09, 2022-11-15, 2022-11-23, 2022-11-30    See weekly On Treatment Notes in Epic for details.  He tolerated the treatments fairly well with only mild dysuria and modest fatigue.  The patient will receive a call in about one month from the radiation oncology department. He will continue follow up with his urologist, Dr. Liliane Shi as well.  ------------------------------------------------   Margaretmary Dys, MD [facility]  Radiation Oncology Direct Dial: [phone]  Fax: [phone] [email or web address]  Skype  LinkedIn

## 2022-12-07 NOTE — Progress Notes (Signed)
Patient was a RadOnc Consult on 07/31/22 for his stage T1cN0M1b, oligometastatic adenocarcinoma of the prostate with Gleason score of 4+3, and PSA of 5.15.  Patient proceed with treatment recommendations of 8 weeks of external beam therapy to the prostate, pelvic lymph nodes and left SI joint, concurrent with ADT and had his final radiation treatment on 11/30/22.  Patient is scheduled for a post treatment nurse call on 01/08/23 and has his first post treatment PSA on 03/07/23 at [facility].    RN spoke with patient and provided education on post treatment follow up's expectations.    Patient also had finding on PSMA for bilateral pulmonary nodules and was treatment with SBRT.  Patient has a follow up CT Chest on 7/19.

## 2022-12-14 DIAGNOSIS — C61 Malignant neoplasm of prostate: Secondary | ICD-10-CM | POA: Diagnosis not present

## 2022-12-14 DIAGNOSIS — C3490 Malignant neoplasm of unspecified part of unspecified bronchus or lung: Secondary | ICD-10-CM | POA: Diagnosis not present

## 2022-12-14 DIAGNOSIS — R600 Localized edema: Secondary | ICD-10-CM | POA: Diagnosis not present

## 2022-12-14 DIAGNOSIS — I10 Essential (primary) hypertension: Secondary | ICD-10-CM | POA: Diagnosis not present

## 2022-12-14 DIAGNOSIS — R0602 Shortness of breath: Secondary | ICD-10-CM | POA: Diagnosis not present

## 2022-12-21 DIAGNOSIS — N390 Urinary tract infection, site not specified: Secondary | ICD-10-CM | POA: Diagnosis not present

## 2023-01-02 DIAGNOSIS — C61 Malignant neoplasm of prostate: Secondary | ICD-10-CM | POA: Diagnosis not present

## 2023-01-02 DIAGNOSIS — R338 Other retention of urine: Secondary | ICD-10-CM | POA: Diagnosis not present

## 2023-01-08 ENCOUNTER — Ambulatory Visit
Admission: RE | Admit: 2023-01-08 | Discharge: 2023-01-08 | Disposition: A | Discharge status: Home / Self Care | Payer: Medicare Other | Source: Ambulatory Visit | Attending: Radiation Oncology | Admitting: Radiation Oncology

## 2023-01-08 NOTE — Progress Notes (Signed)
  Radiation Oncology         (336) [phone] ________________________________  Name: Chad Moreno MRN: [id number]  Date of Service: 01/08/2023  DOB: [birth year]  Post Treatment Telephone Note  Diagnosis:  82 y.o. 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 (as documented in provider EOT note)   Pre Treatment IPSS Score: 18 (as documented in the provider consult note)  The patient was not available for call today. Voicemail left.  Patient has a scheduled follow up visit with his urologist, Dr. Liliane Shi, on 01/17/2023 for ongoing surveillance. He was counseled that PSA levels will be drawn in the urology office, and was reassured that additional time is expected to improve bowel and bladder symptoms. He was encouraged to call back with concerns or questions regarding radiation.    Ruel Favors, LPN

## 2023-01-11 ENCOUNTER — Ambulatory Visit (HOSPITAL_COMMUNITY)
Admission: RE | Admit: 2023-01-11 | Discharge: 2023-01-11 | Disposition: A | Discharge status: Home / Self Care | Payer: Medicare Other | Source: Ambulatory Visit | Attending: Urology | Admitting: Urology

## 2023-01-11 DIAGNOSIS — C3491 Malignant neoplasm of unspecified part of right bronchus or lung: Secondary | ICD-10-CM | POA: Insufficient documentation

## 2023-01-11 DIAGNOSIS — C3490 Malignant neoplasm of unspecified part of unspecified bronchus or lung: Secondary | ICD-10-CM | POA: Diagnosis not present

## 2023-01-11 DIAGNOSIS — C3492 Malignant neoplasm of unspecified part of left bronchus or lung: Secondary | ICD-10-CM | POA: Diagnosis not present

## 2023-01-11 DIAGNOSIS — I700 Atherosclerosis of aorta: Secondary | ICD-10-CM | POA: Diagnosis not present

## 2023-01-11 MED ORDER — IOHEXOL 300 MG/ML  SOLN
75.0000 mL | Freq: Once | INTRAMUSCULAR | Status: AC | PRN
  Administered 2023-01-11: 75 mL via INTRAVENOUS

## 2023-01-16 ENCOUNTER — Encounter: Payer: Self-pay | Admitting: Urology

## 2023-01-16 ENCOUNTER — Ambulatory Visit
Admission: RE | Admit: 2023-01-16 | Discharge: 2023-01-16 | Disposition: A | Discharge status: Home / Self Care | Payer: Medicare Other | Source: Ambulatory Visit | Attending: Urology | Admitting: Urology

## 2023-01-16 DIAGNOSIS — C3491 Malignant neoplasm of unspecified part of right bronchus or lung: Secondary | ICD-10-CM

## 2023-01-16 DIAGNOSIS — C3492 Malignant neoplasm of unspecified part of left bronchus or lung: Secondary | ICD-10-CM

## 2023-01-16 NOTE — Progress Notes (Signed)
Telephone nursing appointment for patient to review most recent scan results from 01/11/2023. I verified patient's identity x2 and began nursing interview.   Patient reports recent UTI x3 wks. Dr. Liliane Shi (Urologist) has placed a urinary catheter and it is due for removal on 01/17/2023 pending examination, but patient is doing well. No other issues conveyed at this time.   Meaningful use complete.   Patient aware of their 11:00am-01/16/2023 telephone appointment w/ Ashlyn Bruning PA-C. I left my extension [phone] in case patient needs anything. Patient verbalized understanding. This concludes the nursing interview.   Patient contact [phone]     Ruel Favors, LPN

## 2023-01-16 NOTE — Progress Notes (Addendum)
Radiation Oncology         (336) [phone] ________________________________  Name: Chad Moreno MRN: [id number]  Date: 01/16/2023  DOB: 1941/04/15  Post Treatment Note  CC: Chad Greathouse, MD  Chad Moreno*  Diagnosis:    Synchronous Stage IA, NSCLC in right lower lobe lung and left upper lobe lung.   Interval Since Last Radiation:  3 months  09/25/22 - 10/01/22:  The targets in the RLL and LUL were treated to 54 Gy in 3 fractions of 18 Gy each  Narrative:  I spoke with the patient to conduct his routine scheduled 3 month follow up visit to review results of his post-treatment CT Chest via telephone to spare the patient unnecessary potential exposure in the healthcare setting during the current COVID-19 pandemic.  The patient was notified in advance and gave permission to proceed with this visit format.  He tolerated radiation treatment relatively well with only mild fatigue.                               On review of systems, the patient states that he is doing well in general and currently without complaints related to his breathing. He has also recently completed prostate IMRT and currently has an indwelling FC which was placed 2 weeks ago due to incomplete bladder emptying following a UTI. He has completed abx as prescribed and is scheduled for a voiding trial in the urology office 01/17/23 with Dr. Liliane Moreno. He denies shortness of breath, in fact, he feels like his breathing is improved since completing treatment and he denies cough, chest pain or hemoptysis. He has not had recent fever or chills and reports a healthy appetite. He has noted some swelling in his feet and ankles, bilaterally, after sitting or standing any prolonged period of time. The swelling resolves with elevating his legs. He has seen his PCP, Dr. Virgel Moreno, who is monitoring this and had recent imaging through his office that did not show any fluid around the heart or in the lungs. Overall, he is pleased with his progress  to date.  ALLERGIES:  is allergic to sulfa antibiotics.  Meds: Current Outpatient Medications  Medication Sig Dispense Refill   amLODipine (NORVASC) 2.5 MG tablet Take 2.5 mg by mouth in the morning.     Ascorbic Acid (VITAMIN C PO) Take 1 tablet by mouth daily with breakfast.     aspirin EC 81 MG tablet Take 81 mg by mouth in the morning.     B Complex Vitamins (VITAMIN-B COMPLEX) TABS Take 1 tablet by mouth daily with breakfast.     Cholecalciferol (VITAMIN D3) 50 MCG (2000 UT) TABS Take 2,000 Units by mouth daily with breakfast.     gabapentin (NEURONTIN) 100 MG capsule Take 100 mg by mouth 3 (three) times daily as needed (PAIN.).     HYDROcodone-acetaminophen (NORCO/VICODIN) 5-325 MG tablet Take 1 tablet by mouth every 6 (six) hours as needed. 5 tablet 0   losartan (COZAAR) 100 MG tablet Take 50 mg by mouth 2 (two) times daily.      megestrol (MEGACE) 20 MG tablet Take 1 tablet (20 mg total) by mouth daily. 30 tablet 2   meloxicam (MOBIC) 15 MG tablet Take 15 mg by mouth in the morning.     naproxen sodium (ALEVE) 220 MG tablet Take 220 mg by mouth daily as needed (soreness with golfing).     Omega-3 Fatty Acids (FISH OIL  PO) Take 1 capsule by mouth daily with breakfast.     POTASSIUM PO Take 1 tablet by mouth daily with breakfast.     Probiotic Product (PROBIOTIC PEARLS ADVANTAGE PO) Take 1 capsule by mouth in the morning.     silodosin (RAPAFLO) 4 MG CAPS capsule Take 1 capsule (4 mg total) by mouth daily as needed (urinary retention.).     No current facility-administered medications for this visit.    Physical Findings:  vitals were not taken for this visit.   /10 Unable to assess due to telephone follow up visit format.   Lab Findings: Lab Results  Component Value Date   WBC 8.3 10/17/2022   HGB 13.1 10/17/2022   HCT 37.1 (L) 10/17/2022   MCV 89.4 10/17/2022   PLT 170 10/17/2022     Radiographic Findings: CT Chest W Contrast  Result Date: 01/15/2023 CLINICAL  DATA:  Non-small cell lung cancer (NSCLC), non-metastatic, assess treatment response disease restaging for NSCLC in RLL and LUL treated with SBRT in 09/2022 * Tracking Code: BO * EXAM: CT CHEST WITH CONTRAST TECHNIQUE: Multidetector CT imaging of the chest was performed during intravenous contrast administration. RADIATION DOSE REDUCTION: This exam was performed according to the departmental dose-optimization program which includes automated exposure control, adjustment of the mA and/or kV according to patient size and/or use of iterative reconstruction technique. CONTRAST:  75mL OMNIPAQUE IOHEXOL 300 MG/ML  SOLN COMPARISON:  CT scan chest from 08/24/2022. FINDINGS: Cardiovascular: Normal cardiac size. No pericardial effusion. No aortic aneurysm. There are coronary artery calcifications, in keeping with coronary artery disease. There are also mild-to-moderate peripheral atherosclerotic vascular calcifications of thoracic aorta and its major branches. Mediastinum/Nodes: Evaluation of thyroid gland is limited due to streak artifacts from contrast in the left brachiocephalic vein. However, there are ill-defined hypoattenuating areas marked with electronic arrow sign on series 2, which are grossly similar to the prior study. These are incompletely characterized on the current examination. No solid / cystic mediastinal masses. The esophagus is nondistended precluding optimal assessment. No axillary, mediastinal or hilar lymphadenopathy by size criteria. Lungs/Pleura: The central tracheo-bronchial tree is patent. In the area of previously seen lung nodules, there are new airspace opacities on today's exam. For example, in the left lung apex there is an approximately 2.1 x 4.2 cm (when measured orthogonally on axial plane), opacity anterior to the fiducial marker. There is mild surrounding ground-glass opacities. There is an additional similar characteristic approximately 2.6 x 5.6 cm opacity in the superior segment of  right lung lower lobe which is superior to the fiducial marker. Findings favor radiation pneumonitis. However, continued short-term follow-up is recommended to exclude residual tumor. Bilateral lungs are device essentially clear. Minimal dependent changes noted. No new lung mass. There is trace left pleural effusion. No right pleural effusion. There are stable, several, sub 4 mm, noncalcified nodules throughout bilateral lungs (marked with electronic arrow sign on series 100). No new or suspicious lung nodules. Upper Abdomen:There is a stable cyst in the right kidney upper pole, anteriorly. There is stable nodularity of the left adrenal gland which were non FDG avid on the recent PET-CT scan. Remaining visualized upper abdominal viscera within normal limits. Musculoskeletal: The visualized soft tissues of the chest wall are grossly unremarkable. No suspicious osseous lesions. There are mild multilevel degenerative changes in the visualized spine. IMPRESSION: 1. In the areas of previously seen lung nodules, there are new airspace opacities on today's exam, as described in detail above. Findings favor radiation pneumonitis. However,  continued short-term follow-up is recommended to exclude residual tumor. 2. Otherwise, no metastatic disease identified within the chest. 3. Multiple other nonacute observations, as described above. Aortic Atherosclerosis (ICD10-I70.0). Electronically Signed   By: Jules Schick M.D.   On: 01/15/2023 09:24    Impression/Plan: 1. Synchronous Stage IA, NSCLC in right lower lobe lung and left upper lobe lung.  The patient appears to be doing well following SBRT and is currently without complaints. We reviewed the results of his recent post-treatment CT Chest scan from 01/11/23 which shows radiation treatment changes in the RLL and LUL lungs without evidence of disease progression or recurrence. Multiple other small lung nodules are stable and there is no evidence of metastatic disease in  the chest. Therefore, we will continue with close monitoring of the treated lesions with serial CT Chest scans and will plan for a repeat scan in 3 months. Provided that this scan is stable, we will then move forward with serial imaging at six-month intervals until 5 years when, at that point in time, he will have an annual low dose CT scan for surveillance. I will plan to call him following each scan to review results and recommendations. He is comfortable and in agreement with the stated plan and knows that he is welcome to call with any questions or concerns in the interim.    I personally spent 30 minutes in this encounter including chart review, reviewing radiological studies, telephone conversation with the patient and his wife, entering orders, coordinating care and completing documentation.     Marguarite Arbour, PA-C

## 2023-01-17 DIAGNOSIS — R338 Other retention of urine: Secondary | ICD-10-CM | POA: Diagnosis not present

## 2023-02-05 ENCOUNTER — Telehealth: Payer: Self-pay | Admitting: *Deleted

## 2023-02-05 NOTE — Telephone Encounter (Signed)
CALLED PATIENT TO INFORM OF CT FOR 04-02-23- ARRIVAL TIME- 10 AM FOR LABS AND CT TO FOLLOW @ 10:30 AM, NO RESTRICTIONS TO SCAN, PATIENT TO RECEIVE RESULTS FROM ASHLYN BRUNING ON 04-03-23 @ 9:30 AM  VIA TELEPHONE, SPOKE WITH PATIENT AND HE IS AWARE OF THESE APPTS. AND THE INSTRUCTIONS

## 2023-02-08 DIAGNOSIS — C61 Malignant neoplasm of prostate: Secondary | ICD-10-CM | POA: Diagnosis not present

## 2023-02-12 DIAGNOSIS — R3915 Urgency of urination: Secondary | ICD-10-CM | POA: Diagnosis not present

## 2023-02-12 DIAGNOSIS — R8279 Other abnormal findings on microbiological examination of urine: Secondary | ICD-10-CM | POA: Diagnosis not present

## 2023-02-12 DIAGNOSIS — R350 Frequency of micturition: Secondary | ICD-10-CM | POA: Diagnosis not present

## 2023-02-12 DIAGNOSIS — C61 Malignant neoplasm of prostate: Secondary | ICD-10-CM | POA: Diagnosis not present

## 2023-03-04 DIAGNOSIS — E785 Hyperlipidemia, unspecified: Secondary | ICD-10-CM | POA: Diagnosis not present

## 2023-03-04 DIAGNOSIS — I10 Essential (primary) hypertension: Secondary | ICD-10-CM | POA: Diagnosis not present

## 2023-03-04 DIAGNOSIS — M109 Gout, unspecified: Secondary | ICD-10-CM | POA: Diagnosis not present

## 2023-03-04 DIAGNOSIS — R7302 Impaired glucose tolerance (oral): Secondary | ICD-10-CM | POA: Diagnosis not present

## 2023-03-04 DIAGNOSIS — C61 Malignant neoplasm of prostate: Secondary | ICD-10-CM | POA: Diagnosis not present

## 2023-03-04 DIAGNOSIS — R972 Elevated prostate specific antigen [PSA]: Secondary | ICD-10-CM | POA: Diagnosis not present

## 2023-03-18 DIAGNOSIS — D649 Anemia, unspecified: Secondary | ICD-10-CM | POA: Diagnosis not present

## 2023-03-18 DIAGNOSIS — R600 Localized edema: Secondary | ICD-10-CM | POA: Diagnosis not present

## 2023-03-18 DIAGNOSIS — M109 Gout, unspecified: Secondary | ICD-10-CM | POA: Diagnosis not present

## 2023-03-18 DIAGNOSIS — Z0000 Encounter for general adult medical examination without abnormal findings: Secondary | ICD-10-CM | POA: Diagnosis not present

## 2023-03-18 DIAGNOSIS — Z1339 Encounter for screening examination for other mental health and behavioral disorders: Secondary | ICD-10-CM | POA: Diagnosis not present

## 2023-03-18 DIAGNOSIS — Z1331 Encounter for screening for depression: Secondary | ICD-10-CM | POA: Diagnosis not present

## 2023-03-18 DIAGNOSIS — R7302 Impaired glucose tolerance (oral): Secondary | ICD-10-CM | POA: Diagnosis not present

## 2023-03-18 DIAGNOSIS — I10 Essential (primary) hypertension: Secondary | ICD-10-CM | POA: Diagnosis not present

## 2023-03-18 DIAGNOSIS — C61 Malignant neoplasm of prostate: Secondary | ICD-10-CM | POA: Diagnosis not present

## 2023-03-18 DIAGNOSIS — R82998 Other abnormal findings in urine: Secondary | ICD-10-CM | POA: Diagnosis not present

## 2023-03-18 DIAGNOSIS — E785 Hyperlipidemia, unspecified: Secondary | ICD-10-CM | POA: Diagnosis not present

## 2023-03-18 DIAGNOSIS — R490 Dysphonia: Secondary | ICD-10-CM | POA: Diagnosis not present

## 2023-03-18 DIAGNOSIS — M1990 Unspecified osteoarthritis, unspecified site: Secondary | ICD-10-CM | POA: Diagnosis not present

## 2023-03-18 DIAGNOSIS — E669 Obesity, unspecified: Secondary | ICD-10-CM | POA: Diagnosis not present

## 2023-03-18 DIAGNOSIS — C3490 Malignant neoplasm of unspecified part of unspecified bronchus or lung: Secondary | ICD-10-CM | POA: Diagnosis not present

## 2023-03-20 DIAGNOSIS — Z23 Encounter for immunization: Secondary | ICD-10-CM | POA: Diagnosis not present

## 2023-04-02 ENCOUNTER — Ambulatory Visit (HOSPITAL_COMMUNITY)
Admission: RE | Admit: 2023-04-02 | Discharge: 2023-04-02 | Disposition: A | Discharge status: Home / Self Care | Payer: Medicare Other | Source: Ambulatory Visit | Attending: Urology | Admitting: Urology

## 2023-04-02 ENCOUNTER — Encounter (HOSPITAL_COMMUNITY): Payer: Self-pay

## 2023-04-02 DIAGNOSIS — C3490 Malignant neoplasm of unspecified part of unspecified bronchus or lung: Secondary | ICD-10-CM | POA: Diagnosis not present

## 2023-04-02 DIAGNOSIS — C3492 Malignant neoplasm of unspecified part of left bronchus or lung: Secondary | ICD-10-CM | POA: Diagnosis not present

## 2023-04-02 DIAGNOSIS — C779 Secondary and unspecified malignant neoplasm of lymph node, unspecified: Secondary | ICD-10-CM | POA: Diagnosis not present

## 2023-04-02 DIAGNOSIS — C3491 Malignant neoplasm of unspecified part of right bronchus or lung: Secondary | ICD-10-CM | POA: Insufficient documentation

## 2023-04-02 DIAGNOSIS — J90 Pleural effusion, not elsewhere classified: Secondary | ICD-10-CM | POA: Diagnosis not present

## 2023-04-02 LAB — POCT I-STAT CREATININE: Creatinine, Ser: 1.2 mg/dL (ref 0.61–1.24)

## 2023-04-02 MED ORDER — IOHEXOL 300 MG/ML  SOLN
100.0000 mL | Freq: Once | INTRAMUSCULAR | Status: AC | PRN
  Administered 2023-04-02: 75 mL via INTRAVENOUS

## 2023-04-03 ENCOUNTER — Encounter: Payer: Self-pay | Admitting: Urology

## 2023-04-03 ENCOUNTER — Ambulatory Visit
Admission: RE | Admit: 2023-04-03 | Discharge: 2023-04-03 | Disposition: A | Discharge status: Home / Self Care | Payer: Medicare Other | Source: Ambulatory Visit | Attending: Urology | Admitting: Urology

## 2023-04-03 DIAGNOSIS — C3492 Malignant neoplasm of unspecified part of left bronchus or lung: Secondary | ICD-10-CM

## 2023-04-03 DIAGNOSIS — C61 Malignant neoplasm of prostate: Secondary | ICD-10-CM

## 2023-04-03 DIAGNOSIS — C3491 Malignant neoplasm of unspecified part of right bronchus or lung: Secondary | ICD-10-CM

## 2023-04-03 DIAGNOSIS — Z191 Hormone sensitive malignancy status: Secondary | ICD-10-CM | POA: Diagnosis not present

## 2023-04-03 NOTE — Progress Notes (Signed)
Telephone nursing appointment for patient to review most recent scan results. I verified patient's identity x2 and began nursing interview.   Patient reports SOB w/ exertion, fatigue. No other issues conveyed at this time.   Meaningful use complete.   Patient aware of their 9:30am telephone appointment w/ Ashlyn Bruning PA-C. I left my extension [phone] in case patient needs anything. Patient verbalized understanding. This concludes the nursing interview.   Patient contact [phone]     Ruel Favors, LPN

## 2023-04-03 NOTE — Progress Notes (Addendum)
Radiation Oncology         [phone] ________________________________  Name: Chad Moreno MRN: [id number]  Date: 04/03/2023  DOB: Jan 12, 1941  Post Treatment Note  CC: [name], MD  Shelly Rubenstein*  Diagnosis:    82 y.o. 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  Interval Since Last Radiation:  4 months s/p IMRT prostate and 6 months s/p SBRT to the RLL and LUL   10/22/22 - 11/30/22: The prostate and pelvic bone metastases were treated to 70 Gy in 28 fractions while simultaneously treating the elective pelvic nodes to 50.4 Gy in 28 fractions; concurrent with ADT.  09/25/22 - 10/01/22:  The targets in the RLL and LUL were treated to 54 Gy in 3 fractions of 18 Gy each  Narrative:  I spoke with the patient to conduct his routine scheduled 3 month follow up visit to review results of his post-treatment CT Chest via telephone to spare the patient unnecessary potential exposure in the healthcare setting during the current COVID-19 pandemic.  The patient was notified in advance and gave permission to proceed with this visit format.  He tolerated the radiation treatments relatively well with only mild fatigue.                               On review of systems, the patient states that he is doing well in general and currently without complaints related to his breathing. He is dealing with some persistent hoarseness that developed approximately 1 month ago with no associated URI, sore throat, cough, fever or chills. His PCP tried treating with antiacids to see if it was related to reflux but he did not see any improvement. Therefore, his PCP, Dr. Felipa Eth, is going to refer him to an ENT for further evaluation. He has some chronic shortness of breath with exertion but this is unchanged recently. He  denies cough, chest pain or hemoptysis. He has not had recent fever or chills and reports a healthy appetite, maintaining his weight. He had a recent post treatment CT Chest scan 04/02/23 that shows radiation treatment changes in the RLL and LUL lungs without evidence of disease progression or recurrence. Multiple other small lung nodules are stable and there is no evidence of metastatic disease in the chest. There is a mildly progressive left axillary node that is non-specific but suspicious for possible isolated nodal metastasis vs reactive lymph node. We reviewed these results today.  He has also recently completed prostate IMRT and remains on Eligard ADT (got another 6 month injection 02/12/23) with and undetectable PSA at the time of his most recent follow up visit with Dr. Liliane Shi. He reports that his LUTS have improved and specifically denies gross hematuria, dysuria, straining to void or incontinence. Overall, he is pleased with his progress to date.  ALLERGIES:  is allergic to sulfa antibiotics.  Meds: Current Outpatient Medications  Medication Sig Dispense Refill   amLODipine (NORVASC) 2.5 MG tablet Take 2.5 mg by mouth in the morning.     Ascorbic Acid (VITAMIN C PO) Take 1 tablet by mouth daily with breakfast.     aspirin EC 81 MG tablet Take 81 mg by mouth in the morning.     B Complex Vitamins (VITAMIN-B COMPLEX) TABS  Take 1 tablet by mouth daily with breakfast.     Cholecalciferol (VITAMIN D3) 50 MCG (2000 UT) TABS Take 2,000 Units by mouth daily with breakfast.     gabapentin (NEURONTIN) 100 MG capsule Take 100 mg by mouth 3 (three) times daily as needed (PAIN.).     HYDROcodone-acetaminophen (NORCO/VICODIN) 5-325 MG tablet Take 1 tablet by mouth every 6 (six) hours as needed. 5 tablet 0   losartan (COZAAR) 100 MG tablet Take 50 mg by mouth 2 (two) times daily.      megestrol (MEGACE) 20 MG tablet Take 1 tablet (20 mg total) by mouth daily. 30 tablet 2   meloxicam (MOBIC) 15 MG tablet  Take 15 mg by mouth in the morning.     naproxen sodium (ALEVE) 220 MG tablet Take 220 mg by mouth daily as needed (soreness with golfing).     Omega-3 Fatty Acids (FISH OIL PO) Take 1 capsule by mouth daily with breakfast.     POTASSIUM PO Take 1 tablet by mouth daily with breakfast.     Probiotic Product (PROBIOTIC PEARLS ADVANTAGE PO) Take 1 capsule by mouth in the morning.     silodosin (RAPAFLO) 4 MG CAPS capsule Take 1 capsule (4 mg total) by mouth daily as needed (urinary retention.).     No current facility-administered medications for this encounter.    Physical Findings:  vitals were not taken for this visit.  Pain Assessment Pain Score: 0-No pain/10 Unable to assess due to telephone follow up visit format.   Lab Findings: Lab Results  Component Value Date   WBC 8.3 10/17/2022   HGB 13.1 10/17/2022   HCT 37.1 (L) 10/17/2022   MCV 89.4 10/17/2022   PLT 170 10/17/2022     Radiographic Findings: CT CHEST W CONTRAST  Result Date: 04/03/2023 CLINICAL DATA:  Non-small cell lung cancer, status post SBRT EXAM: CT CHEST WITH CONTRAST TECHNIQUE: Multidetector CT imaging of the chest was performed during intravenous contrast administration. RADIATION DOSE REDUCTION: This exam was performed according to the departmental dose-optimization program which includes automated exposure control, adjustment of the mA and/or kV according to patient size and/or use of iterative reconstruction technique. CONTRAST:  75mL OMNIPAQUE IOHEXOL 300 MG/ML  SOLN COMPARISON:  01/11/2023 FINDINGS: Cardiovascular: The heart is normal in size. No pericardial effusion. No evidence of thoracic aortic aneurysm. Atherosclerotic calcifications of the aortic arch. Moderate three-vessel coronary atherosclerosis. Mediastinum/Nodes: No suspicious mediastinal lymphadenopathy. 11 mm short axis left axillary node (series 2/image 37), previously 5 mm, suspicious. Lungs/Pleura: Localized radiation changes with fiducial marker  in the left upper lobe (series 6/image 40). Localized radiation changes with fiducial marker in the superior segment right lower lobe (series 6/image 69). Moderate left pleural effusion, progressive. Associated mild left lower lobe atelectasis. No new/suspicious pulmonary nodules. No focal consolidation. No pneumothorax. Upper Abdomen: Visualized upper abdomen is grossly unremarkable, noting vascular calcifications. Musculoskeletal: Visualized osseous structures are within normal limits. IMPRESSION: Status post SBRT with radiation changes in the left upper lobe and superior segment right lower lobe. Mildly progressive left axillary node, suspicious. Isolated nodal metastasis/recurrence is possible. Attention on follow-up is suggested. Moderate left pleural effusion, progressive. Aortic Atherosclerosis (ICD10-I70.0). Electronically Signed   By: Charline Bills M.D.   On: 04/03/2023 08:28    Impression/Plan: 1. 82 y.o. 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   The patient appears to be doing well following SBRT and is currently without complaints aside from some persistent hoarseness that has been ongoing for the past month. He is being referred to an ENT by his PCP, for further evaluation and management of the hoarseness. We reviewed the results of his recent post-treatment CT Chest scan from 04/02/23 which shows radiation treatment changes in the RLL and LUL lungs without evidence of disease progression or recurrence. Multiple other small lung nodules are stable and there is no evidence of metastatic disease in the chest. There is a mildly progressive left axillary node that is non-specific but suspicious for possible isolated nodal metastasis vs reactive lymph node. Therefore, we will continue with close monitoring  of the treated lesions and the left axillary node with a repeat CT Chest scan in 3 months and I will follow up with him thereafter to review results and any recommendations. Provided that this scan is stable, we will then move forward with serial imaging at six-month intervals until 5 years when, at that point in time, he will have an annual low dose CT scan for surveillance. I will plan to call him following each scan to review results and recommendations. He will also continue in routine follow up with his urologist, Dr. Liliane Shi, for continued monitoring and management of his prostate cancer and we will follow via correspondence. He is comfortable and in agreement with the stated plan and knows that he is welcome to call with any questions or concerns in the interim.   I personally spent 30 minutes in this encounter including chart review, reviewing radiological studies, telephone conversation with the patient and his wife, entering orders, coordinating care and completing documentation.     Marguarite Arbour, PA-C

## 2023-04-09 DIAGNOSIS — N3020 Other chronic cystitis without hematuria: Secondary | ICD-10-CM | POA: Diagnosis not present

## 2023-04-13 DIAGNOSIS — Z23 Encounter for immunization: Secondary | ICD-10-CM | POA: Diagnosis not present

## 2023-04-29 ENCOUNTER — Telehealth: Payer: Self-pay | Admitting: Emergency Medicine

## 2023-04-29 NOTE — Telephone Encounter (Signed)
PT was calling about FU appt notice she got yet their case may have been taken over by a lung cancer Dr. She was not sure if she should make an appt for her husband w/Dr. Delton Coombes or  or not. Pls call @ [phone] or [phone]

## 2023-05-02 DIAGNOSIS — R490 Dysphonia: Secondary | ICD-10-CM | POA: Diagnosis not present

## 2023-05-02 DIAGNOSIS — J3801 Paralysis of vocal cords and larynx, unilateral: Secondary | ICD-10-CM | POA: Diagnosis not present

## 2023-05-06 ENCOUNTER — Telehealth: Payer: Self-pay | Admitting: *Deleted

## 2023-05-06 NOTE — Telephone Encounter (Signed)
CALLED PATIENT TO INFORM OF CT FOR 07-04-23- ARRIVAL TIME- 12:15 PM @ [facility], NO RESTRICTIONS TO SCAN, PATIENT TO RECEIVE RESULTS FROM ASHLYN BRUNING ON 07-10-23 @ 9:30 AM  VIA TELEPHONE, SPOKE WITH PATIENT AND HE IS AWARE OF THESE APPTS. AND THE INSTRUCTIONS

## 2023-05-08 ENCOUNTER — Other Ambulatory Visit: Payer: Self-pay | Admitting: Physician Assistant

## 2023-05-08 DIAGNOSIS — J3801 Paralysis of vocal cords and larynx, unilateral: Secondary | ICD-10-CM

## 2023-05-08 NOTE — Telephone Encounter (Signed)
Called patients wife, Gavin Pound.  Made OV for January 2025.  Answered all questions.  Patient's wife verbalized understanding.

## 2023-05-10 ENCOUNTER — Ambulatory Visit
Admission: RE | Admit: 2023-05-10 | Discharge: 2023-05-10 | Disposition: A | Discharge status: Home / Self Care | Payer: Medicare Other | Source: Ambulatory Visit | Attending: Physician Assistant | Admitting: Physician Assistant

## 2023-05-10 DIAGNOSIS — J3801 Paralysis of vocal cords and larynx, unilateral: Secondary | ICD-10-CM

## 2023-05-10 DIAGNOSIS — R599 Enlarged lymph nodes, unspecified: Secondary | ICD-10-CM | POA: Diagnosis not present

## 2023-05-10 MED ORDER — IOPAMIDOL (ISOVUE-300) INJECTION 61%
100.0000 mL | Freq: Once | INTRAVENOUS | Status: AC | PRN
  Administered 2023-05-10: 75 mL via INTRAVENOUS

## 2023-05-27 DIAGNOSIS — C61 Malignant neoplasm of prostate: Secondary | ICD-10-CM | POA: Diagnosis not present

## 2023-05-27 DIAGNOSIS — R350 Frequency of micturition: Secondary | ICD-10-CM | POA: Diagnosis not present

## 2023-05-27 DIAGNOSIS — R3915 Urgency of urination: Secondary | ICD-10-CM | POA: Diagnosis not present

## 2023-05-27 DIAGNOSIS — N3020 Other chronic cystitis without hematuria: Secondary | ICD-10-CM | POA: Diagnosis not present

## 2023-05-30 DIAGNOSIS — H3540 Unspecified peripheral retinal degeneration: Secondary | ICD-10-CM | POA: Diagnosis not present

## 2023-05-30 DIAGNOSIS — H53143 Visual discomfort, bilateral: Secondary | ICD-10-CM | POA: Diagnosis not present

## 2023-05-30 DIAGNOSIS — H52222 Regular astigmatism, left eye: Secondary | ICD-10-CM | POA: Diagnosis not present

## 2023-05-30 DIAGNOSIS — H35033 Hypertensive retinopathy, bilateral: Secondary | ICD-10-CM | POA: Diagnosis not present

## 2023-05-30 DIAGNOSIS — H25813 Combined forms of age-related cataract, bilateral: Secondary | ICD-10-CM | POA: Diagnosis not present

## 2023-05-30 DIAGNOSIS — Q130 Coloboma of iris: Secondary | ICD-10-CM | POA: Diagnosis not present

## 2023-05-30 DIAGNOSIS — H5202 Hypermetropia, left eye: Secondary | ICD-10-CM | POA: Diagnosis not present

## 2023-05-30 DIAGNOSIS — I10 Essential (primary) hypertension: Secondary | ICD-10-CM | POA: Diagnosis not present

## 2023-05-30 DIAGNOSIS — B399 Histoplasmosis, unspecified: Secondary | ICD-10-CM | POA: Diagnosis not present

## 2023-06-04 ENCOUNTER — Telehealth: Payer: Self-pay | Admitting: *Deleted

## 2023-06-04 ENCOUNTER — Other Ambulatory Visit: Payer: Self-pay | Admitting: Urology

## 2023-06-04 DIAGNOSIS — R918 Other nonspecific abnormal finding of lung field: Secondary | ICD-10-CM

## 2023-06-04 DIAGNOSIS — C3491 Malignant neoplasm of unspecified part of right bronchus or lung: Secondary | ICD-10-CM

## 2023-06-04 DIAGNOSIS — C3492 Malignant neoplasm of unspecified part of left bronchus or lung: Secondary | ICD-10-CM

## 2023-06-04 NOTE — Telephone Encounter (Signed)
CALLED PATIENT TO INFORM THAT HE WOULD NEED TO COME IN FOR FU ON 06-13-23 @ 3:30 PM, SPOKE WITH PATIENT AND HE IS AWARE OF THIS

## 2023-06-04 NOTE — Telephone Encounter (Signed)
CALLED PATIENT TO INFORM OF CT FOR 06-06-23- ARRIVAL TIME- 4:45 PM @ [facility], NO RESTRICTIONS TO SCAN, PATIENT TO RECEIVE RESULTS FROM ASHLYN BRUINING ON 06-13-23 @ 2 PM, VIA TELEPHONE, LVM FOR A RETURN CALL

## 2023-06-04 NOTE — Telephone Encounter (Signed)
Called patient to ask if he would be available to come on 06-13-23 @ 1 pm for fu, patient agreed to this

## 2023-06-06 ENCOUNTER — Ambulatory Visit (HOSPITAL_COMMUNITY)
Admission: RE | Admit: 2023-06-06 | Discharge: 2023-06-06 | Disposition: A | Discharge status: Home / Self Care | Payer: Medicare Other | Source: Ambulatory Visit | Attending: Urology | Admitting: Urology

## 2023-06-06 DIAGNOSIS — C3492 Malignant neoplasm of unspecified part of left bronchus or lung: Secondary | ICD-10-CM | POA: Insufficient documentation

## 2023-06-06 DIAGNOSIS — I700 Atherosclerosis of aorta: Secondary | ICD-10-CM | POA: Diagnosis not present

## 2023-06-06 DIAGNOSIS — C3431 Malignant neoplasm of lower lobe, right bronchus or lung: Secondary | ICD-10-CM | POA: Diagnosis not present

## 2023-06-06 DIAGNOSIS — C3491 Malignant neoplasm of unspecified part of right bronchus or lung: Secondary | ICD-10-CM | POA: Diagnosis not present

## 2023-06-06 DIAGNOSIS — R591 Generalized enlarged lymph nodes: Secondary | ICD-10-CM | POA: Diagnosis not present

## 2023-06-06 DIAGNOSIS — J90 Pleural effusion, not elsewhere classified: Secondary | ICD-10-CM | POA: Diagnosis not present

## 2023-06-06 MED ORDER — IOHEXOL 300 MG/ML  SOLN
75.0000 mL | Freq: Once | INTRAMUSCULAR | Status: AC | PRN
  Administered 2023-06-06: 75 mL via INTRAVENOUS

## 2023-06-07 DIAGNOSIS — J3801 Paralysis of vocal cords and larynx, unilateral: Secondary | ICD-10-CM | POA: Diagnosis not present

## 2023-06-07 DIAGNOSIS — R490 Dysphonia: Secondary | ICD-10-CM | POA: Diagnosis not present

## 2023-06-11 NOTE — Progress Notes (Incomplete)
Chad Moreno is here today for follow up post radiation to the lung.  Lung Side: Left Upper Lobe  Does the patient complain of any of the following: Pain:*** Shortness of breath w/wo exertion: *** Cough: *** Hemoptysis: *** Pain with swallowing: *** Swallowing/choking concerns: *** Appetite: *** Energy Level: *** Post radiation skin Changes: ***   Additional comments if applicable:   06/06/2023 Ashlyn Bruning, PA-C CT Chest with Contrast CLINICAL DATA:  Staging non-small-cell lung cancer right lower lobe and left upper lobe. Prior XRT. Follow up axillary node. * Tracking Code: BO *

## 2023-06-13 ENCOUNTER — Ambulatory Visit
Admission: RE | Admit: 2023-06-13 | Discharge: 2023-06-13 | Disposition: A | Discharge status: Home / Self Care | Payer: Medicare Other | Source: Ambulatory Visit | Attending: Radiation Oncology | Admitting: Radiation Oncology

## 2023-06-13 ENCOUNTER — Ambulatory Visit: Payer: Self-pay | Admitting: Urology

## 2023-06-13 ENCOUNTER — Other Ambulatory Visit: Payer: Self-pay

## 2023-06-13 DIAGNOSIS — C3491 Malignant neoplasm of unspecified part of right bronchus or lung: Secondary | ICD-10-CM

## 2023-06-13 DIAGNOSIS — J90 Pleural effusion, not elsewhere classified: Secondary | ICD-10-CM | POA: Insufficient documentation

## 2023-06-13 DIAGNOSIS — C3492 Malignant neoplasm of unspecified part of left bronchus or lung: Secondary | ICD-10-CM

## 2023-06-13 DIAGNOSIS — C61 Malignant neoplasm of prostate: Secondary | ICD-10-CM | POA: Diagnosis not present

## 2023-06-13 DIAGNOSIS — Z191 Hormone sensitive malignancy status: Secondary | ICD-10-CM | POA: Diagnosis not present

## 2023-06-13 NOTE — Progress Notes (Signed)
Radiation Oncology         (336) [phone] ________________________________  Name: Chad Moreno MRN: [id number]  Date: 06/13/2023  DOB: [birth year]  Post Treatment Note  CC: [name], MD  Shelly Rubenstein*  Diagnosis:    82 y.o. 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  Interval Since Last Radiation:  6 months s/p IMRT prostate and 8 months s/p SBRT to the RLL and LUL   10/22/22 - 11/30/22: The prostate and pelvic bone metastases were treated to 70 Gy in 28 fractions while simultaneously treating the elective pelvic nodes to 50.4 Gy in 28 fractions; concurrent with ADT.  09/25/22 - 10/01/22:  The targets in the RLL and LUL were treated to 54 Gy in 3 fractions of 18 Gy each  Narrative: He tolerated the radiation treatments relatively well with only mild fatigue.                               On review of systems, the patient states that he is doing fair in general.  His follow-up CT chest scan from 04/02/2023 demonstrated evolving radiation changes in the left upper lobe and superior segment right lower lobe surrounding the treated lung lesions as well as a mildly progressive left axillary node that was non-specific but suspicious for possible isolated nodal metastasis vs reactive lymph node. Multiple other small lung nodules appeared stable and there was no evidence of progressive or metastatic disease in the chest so the recommendation was for a repeat CT chest scan in 3 months. He has continued dealing with some persistent hoarseness that developed in 02/2023, with no associated URI, sore throat, cough, fever or chills. His PCP tried treating with antiacids to see if it was related to reflux but he did not see any improvement. Therefore, his PCP, Dr. Felipa Eth, referred him to Dr. Jenne Pane in ENT for  further evaluation. On exam, he was not found to have any masses but appeared to have vocal cord paralysis of unknown etiology. This was further evaluated with Head/Neck CT performed 05/10/23 which confirmed no mass lesions in the neck but there was left vocal cord paralysis, enlarging level 4/level 5 node on the right, enlarging left supraclavicular node, continued enlargement of the left axillary node and progressive pleural effusion on the left.  He has some chronic shortness of breath with exertion but this is has become more pronounced recently. He denies productive cough, chest pain or hemoptysis. He has not had recent fever or chills and reports a healthy appetite, maintaining his weight.  Given the recent findings on CT head/neck, we elected to move his scheduled follow-up chest scan to be done sooner.  This was performed on 06/06/2023 and showed an overall stable appearance of the treated nodules in the LUL and RLL with mildly enlarged left axillary node which appears stable as compared to prior scan from 05/10/2023 and other increasing, but small chest wall nodes and right supraclavicular node.  There is also some possible developing sclerosis in the right ninth rib but no other new or concerning bony lesions.  We reviewed the imaging studies and results with him and his wife today.  He has also recently completed prostate IMRT and remains on Eligard ADT (got another 6 month injection  02/12/23) with and undetectable PSA at the time of his follow up visit with Dr. Liliane Shi in 01/2023. He reports that his LUTS have improved and specifically denies gross hematuria, dysuria, straining to void or incontinence.    ALLERGIES:  is allergic to sulfa antibiotics.  Meds: Current Outpatient Medications  Medication Sig Dispense Refill   amLODipine (NORVASC) 2.5 MG tablet Take 2.5 mg by mouth in the morning.     Ascorbic Acid (VITAMIN C PO) Take 1 tablet by mouth daily with breakfast.     aspirin EC 81 MG tablet  Take 81 mg by mouth in the morning.     B Complex Vitamins (VITAMIN-B COMPLEX) TABS Take 1 tablet by mouth daily with breakfast.     Cholecalciferol (VITAMIN D3) 50 MCG (2000 UT) TABS Take 2,000 Units by mouth daily with breakfast.     gabapentin (NEURONTIN) 100 MG capsule Take 100 mg by mouth 3 (three) times daily as needed (PAIN.).     HYDROcodone-acetaminophen (NORCO/VICODIN) 5-325 MG tablet Take 1 tablet by mouth every 6 (six) hours as needed. 5 tablet 0   losartan (COZAAR) 100 MG tablet Take 50 mg by mouth 2 (two) times daily.      megestrol (MEGACE) 20 MG tablet Take 1 tablet (20 mg total) by mouth daily. 30 tablet 2   meloxicam (MOBIC) 15 MG tablet Take 15 mg by mouth in the morning.     naproxen sodium (ALEVE) 220 MG tablet Take 220 mg by mouth daily as needed (soreness with golfing).     Omega-3 Fatty Acids (FISH OIL PO) Take 1 capsule by mouth daily with breakfast.     POTASSIUM PO Take 1 tablet by mouth daily with breakfast.     Probiotic Product (PROBIOTIC PEARLS ADVANTAGE PO) Take 1 capsule by mouth in the morning.     silodosin (RAPAFLO) 4 MG CAPS capsule Take 1 capsule (4 mg total) by mouth daily as needed (urinary retention.).     No current facility-administered medications for this encounter.    Physical Findings:  vitals were not taken for this visit.   /10 In general this is a well appearing Caucasian man in no acute distress.  He's alert and oriented x4 and appropriate throughout the examination. Cardiopulmonary assessment is negative for acute distress and he exhibits normal effort.  His voice remains hoarse/raspy.  Lab Findings: Lab Results  Component Value Date   WBC 8.3 10/17/2022   HGB 13.1 10/17/2022   HCT 37.1 (L) 10/17/2022   MCV 89.4 10/17/2022   PLT 170 10/17/2022     Radiographic Findings: CT Chest W Contrast Result Date: 06/11/2023 CLINICAL DATA:  Staging non-small-cell lung cancer right lower lobe and left upper lobe. Prior XRT. Follow up axillary  node. * Tracking Code: BO * EXAM: CT CHEST WITH CONTRAST TECHNIQUE: Multidetector CT imaging of the chest was performed during intravenous contrast administration. RADIATION DOSE REDUCTION: This exam was performed according to the departmental dose-optimization program which includes automated exposure control, adjustment of the mA and/or kV according to patient size and/or use of iterative reconstruction technique. CONTRAST:  75mL OMNIPAQUE IOHEXOL 300 MG/ML  SOLN COMPARISON:  CT scan 04/02/2023 and older. FINDINGS: Cardiovascular: Small pericardial effusion. Heart nonenlarged. Coronary artery calcifications are seen. The thoracic aorta has a normal course and caliber with scattered vascular calcifications. Bovine type aortic arch, normal variant. Mediastinum/Nodes: Prominent thyroid gland. Small right-sided thyroid subcentimeter cystic focus. No specific imaging follow up based on size and appearance. Slightly patulous thoracic esophagus.  Previous left axillary lymph node again identified. Previously measured at 11 mm in short axis and today 12 mm on series 2, image 40. Slight increase in small chest wall nodes identified as well image 36 series 2 deep to the pectoralis muscle. There are some prominent low neck lymph nodes on the right side. Example series 2, image 11 measures 10 mm in short axis. Previously 9 mm. Other nodes in this location are also slightly larger. No abnormal hilar or mediastinal nodes identified at this time. Lungs/Pleura: Moderate left pleural effusion, slightly increased from previous. There is a fiduciary marker in the areas of rounded opacity in the left upper lobe. The opacity today appears more confluence overall slightly larger. More extension back to the hilum as well. Area today on series 8, image 57 measures 3.2 x 5.7 cm. Previously 4.6 by 3.8 cm. Slightly different configuration as well. There is also a new tiny 4 mm nodule left lower lobe series 8, image 115. Fiduciary marker also  seen in the posteroinferior right upper lobe with some nodular opacity. The level of thickening and opacities actually decreasing today with significant residual. Persistent bandlike opacity dependently in the right lower lobe. Atelectasis or scar is favored. Upper Abdomen: Stable slight thickening of the left adrenal gland with some nodularity. Right adrenal gland is preserved. Left upper quadrant splenule. Loop of colon extending between the liver margin in the anterior abdominal wall, variant of distribution of bowel. Musculoskeletal: Scattered degenerative changes along the spine. There is sclerosis involving the posterior aspect of the right ninth rib progressive from previous examination. Please correlate with the history. A osseous metastatic lesion is possible. Whole-body bone scan may be useful as the next step in the workup to assess for this lesion as well assess for other foci. IMPRESSION: Slight increase in size of nodular opacity left upper lobe with slight increasing left-sided moderate pleural effusion. Fiduciary marker again seen. Nodular opacity in the right lung with a fiduciary marker actually appears slightly smaller today. Few tiny new left-sided lung nodules, indeterminate. Attention on follow-up Mildly enlarged left axillary/chest wall lymph node is similar to trace increased in size. There are some increasing but still small left chest wall nodes as well. There is also a small prominent node along the low right neck, supraclavicular, jugular chain region. Developing sclerosis along the posterior aspect of the right ninth rib. A metastatic lesion is possible. Please correlate with any history or dedicated whole-body bone scan may be useful as the next step in the workup. Aortic Atherosclerosis (ICD10-I70.0). Electronically Signed   By: Karen Kays M.D.   On: 06/11/2023 12:54    Impression/Plan: 1. 82 y.o. gentleman with Stage T1cN0M1b, oligometastatic adenocarcinoma of the prostate  involving the bony pelvis, with Gleason score of 4+3, and PSA of 5.15  and synchronous Stage IA, NSCLC in the right lower lobe lung and left upper lobe lung.   Today, we talked to the patient and his wife about the findings and workup thus far. We discussed the need for further testing and recommend obtaining a PET scan for disease restaging and a thoracentesis procedure for both therapeutic and diagnostic purposes. The patient was encouraged to ask questions that were answered to his stated satisfaction and he is comfortable and in agreement with the stated plan.  We will work to coordinate the thoracentesis procedure, first available as well as the PET scan and I will plan to call him to review results of the studies as soon as they  are available.  We did discuss the potential need for repeat biopsy and/or referral to medical oncology, pending the results of the PET scan.    2. Oligometastatic prostate cancer: He will continue in routine follow up with his urologist, Dr. Liliane Shi, for continued monitoring and management of his prostate cancer and we will follow via correspondence.  He is tentatively scheduled for a repeat PSA in February 2024, prior to his next follow-up visit with Dr. Liliane Shi for his next [facility] ADT injection.  3. Vocal cord paralysis of unknown etiology: He is hoping to undergo a bulking injection with Dr. Gaston Islam to rejuvenate the paralyzed vocal cord and improve his speech.  We will continue to follow this expectantly.  We personally spent 60 minutes in this encounter including chart review, reviewing radiological studies, face-to-face meeting with the patient and his wife, entering orders, coordinating care and completing documentation.     Marguarite Arbour, PA-C

## 2023-06-14 ENCOUNTER — Ambulatory Visit (HOSPITAL_COMMUNITY)
Admission: RE | Admit: 2023-06-14 | Discharge: 2023-06-14 | Disposition: A | Discharge status: Home / Self Care | Payer: Medicare Other | Source: Ambulatory Visit | Attending: Physician Assistant | Admitting: Physician Assistant

## 2023-06-14 ENCOUNTER — Ambulatory Visit (HOSPITAL_COMMUNITY)
Admission: RE | Admit: 2023-06-14 | Discharge: 2023-06-14 | Disposition: A | Discharge status: Home / Self Care | Payer: Medicare Other | Source: Ambulatory Visit | Attending: Urology | Admitting: Urology

## 2023-06-14 ENCOUNTER — Encounter: Payer: Self-pay | Admitting: Radiology

## 2023-06-14 DIAGNOSIS — E669 Obesity, unspecified: Secondary | ICD-10-CM | POA: Diagnosis not present

## 2023-06-14 DIAGNOSIS — D649 Anemia, unspecified: Secondary | ICD-10-CM | POA: Diagnosis not present

## 2023-06-14 DIAGNOSIS — C3490 Malignant neoplasm of unspecified part of unspecified bronchus or lung: Secondary | ICD-10-CM | POA: Diagnosis not present

## 2023-06-14 DIAGNOSIS — C3491 Malignant neoplasm of unspecified part of right bronchus or lung: Secondary | ICD-10-CM | POA: Insufficient documentation

## 2023-06-14 DIAGNOSIS — C3492 Malignant neoplasm of unspecified part of left bronchus or lung: Secondary | ICD-10-CM | POA: Insufficient documentation

## 2023-06-14 DIAGNOSIS — J90 Pleural effusion, not elsewhere classified: Secondary | ICD-10-CM | POA: Diagnosis not present

## 2023-06-14 DIAGNOSIS — R7989 Other specified abnormal findings of blood chemistry: Secondary | ICD-10-CM | POA: Diagnosis not present

## 2023-06-14 DIAGNOSIS — Z48813 Encounter for surgical aftercare following surgery on the respiratory system: Secondary | ICD-10-CM | POA: Diagnosis not present

## 2023-06-14 DIAGNOSIS — J910 Malignant pleural effusion: Secondary | ICD-10-CM | POA: Diagnosis not present

## 2023-06-14 DIAGNOSIS — R490 Dysphonia: Secondary | ICD-10-CM | POA: Diagnosis not present

## 2023-06-14 DIAGNOSIS — M1990 Unspecified osteoarthritis, unspecified site: Secondary | ICD-10-CM | POA: Diagnosis not present

## 2023-06-14 DIAGNOSIS — R600 Localized edema: Secondary | ICD-10-CM | POA: Diagnosis not present

## 2023-06-14 DIAGNOSIS — R7302 Impaired glucose tolerance (oral): Secondary | ICD-10-CM | POA: Diagnosis not present

## 2023-06-14 DIAGNOSIS — C801 Malignant (primary) neoplasm, unspecified: Secondary | ICD-10-CM | POA: Diagnosis not present

## 2023-06-14 DIAGNOSIS — E785 Hyperlipidemia, unspecified: Secondary | ICD-10-CM | POA: Diagnosis not present

## 2023-06-14 DIAGNOSIS — I10 Essential (primary) hypertension: Secondary | ICD-10-CM | POA: Diagnosis not present

## 2023-06-14 DIAGNOSIS — M109 Gout, unspecified: Secondary | ICD-10-CM | POA: Diagnosis not present

## 2023-06-14 DIAGNOSIS — F4329 Adjustment disorder with other symptoms: Secondary | ICD-10-CM | POA: Diagnosis not present

## 2023-06-14 DIAGNOSIS — C61 Malignant neoplasm of prostate: Secondary | ICD-10-CM | POA: Diagnosis not present

## 2023-06-14 HISTORY — PX: IR THORACENTESIS ASP PLEURAL SPACE W/IMG GUIDE: IMG5380

## 2023-06-14 MED ORDER — LIDOCAINE HCL 1 % IJ SOLN
20.0000 mL | Freq: Once | INTRAMUSCULAR | Status: AC
  Administered 2023-06-14: 10 mL

## 2023-06-14 MED ORDER — LIDOCAINE HCL 1 % IJ SOLN
INTRAMUSCULAR | Status: AC
  Filled 2023-06-14: qty 20

## 2023-06-14 NOTE — Procedures (Addendum)
PROCEDURE SUMMARY:  Successful US guided left sided thoracentesis. Yielded 1.2L of amber, blood tinged fluid. Pt tolerated procedure well. No immediate complications.  Specimen was sent for labs. CXR ordered. No evidence of pneumothorax.   EBL < 5 mL  Mariluz Crespo N Dala Breault PA-C 06/14/2023 1:14 PM

## 2023-06-18 ENCOUNTER — Encounter (HOSPITAL_COMMUNITY)
Admission: RE | Admit: 2023-06-18 | Discharge: 2023-06-18 | Disposition: A | Discharge status: Home / Self Care | Payer: Medicare Other | Source: Ambulatory Visit | Attending: Urology | Admitting: Urology

## 2023-06-18 DIAGNOSIS — C3492 Malignant neoplasm of unspecified part of left bronchus or lung: Secondary | ICD-10-CM | POA: Diagnosis not present

## 2023-06-18 DIAGNOSIS — C778 Secondary and unspecified malignant neoplasm of lymph nodes of multiple regions: Secondary | ICD-10-CM | POA: Insufficient documentation

## 2023-06-18 DIAGNOSIS — Z923 Personal history of irradiation: Secondary | ICD-10-CM | POA: Insufficient documentation

## 2023-06-18 DIAGNOSIS — C3491 Malignant neoplasm of unspecified part of right bronchus or lung: Secondary | ICD-10-CM | POA: Diagnosis not present

## 2023-06-18 DIAGNOSIS — J90 Pleural effusion, not elsewhere classified: Secondary | ICD-10-CM | POA: Insufficient documentation

## 2023-06-18 DIAGNOSIS — C773 Secondary and unspecified malignant neoplasm of axilla and upper limb lymph nodes: Secondary | ICD-10-CM | POA: Diagnosis not present

## 2023-06-18 LAB — GLUCOSE, CAPILLARY: Glucose-Capillary: 116 mg/dL — ABNORMAL HIGH (ref 70–99)

## 2023-06-18 MED ORDER — FLUDEOXYGLUCOSE F - 18 (FDG) INJECTION
10.9200 | Freq: Once | INTRAVENOUS | Status: AC
  Administered 2023-06-18: 10.92 via INTRAVENOUS

## 2023-06-20 LAB — CYTOLOGY - NON PAP

## 2023-06-21 NOTE — Progress Notes (Signed)
STAT read request placed for recent [facility] pet scan.

## 2023-06-24 ENCOUNTER — Ambulatory Visit
Admission: RE | Admit: 2023-06-24 | Discharge: 2023-06-24 | Disposition: A | Discharge status: Home / Self Care | Payer: Medicare Other | Source: Ambulatory Visit | Attending: Radiation Oncology | Admitting: Radiation Oncology

## 2023-06-24 DIAGNOSIS — C3491 Malignant neoplasm of unspecified part of right bronchus or lung: Secondary | ICD-10-CM

## 2023-06-24 DIAGNOSIS — C61 Malignant neoplasm of prostate: Secondary | ICD-10-CM

## 2023-06-24 DIAGNOSIS — C3492 Malignant neoplasm of unspecified part of left bronchus or lung: Secondary | ICD-10-CM

## 2023-06-24 DIAGNOSIS — Z191 Hormone sensitive malignancy status: Secondary | ICD-10-CM | POA: Diagnosis not present

## 2023-06-24 NOTE — Progress Notes (Signed)
Radiation Oncology         (336) [phone] ________________________________  Name: Chad Moreno MRN: [id number]  Date: 06/24/2023  DOB: 01/03/1941  Follow-Up Visit Note  CC: [name], MD  Shelly Rubenstein*  Diagnosis:   82 y.o. gentleman with Stage [phone], oligometastatic adenocarcinoma of the prostate involving the bony pelvis, with Gleason score of 4+3, and PSA of 5.15  and synchronous Stage IA, NSCLC in the right lower lobe lung and left upper lobe lung.  Interval Since Last Radiation:  6 months s/p IMRT prostate and 8 months s/p SBRT to the RLL and LUL   10/22/22 - 11/30/22: The prostate and pelvic bone metastases were treated to 70 Gy in 28 fractions while simultaneously treating the elective pelvic nodes to 50.4 Gy in 28 fractions; concurrent with ADT.  09/25/22 - 10/01/22:  The targets in the RLL and LUL were treated to 54 Gy in 3 fractions of 18 Gy each    ICD-10-CM   1. Malignant neoplasm of prostate (HCC)  C61     2. Non-small cell cancer of left lung (HCC)  C34.92     3. Non-small cell lung cancer, right (HCC)  C34.91       Narrative:  The patient returns today to review his recent PET and thoracentesis.  Unfortunately, the PET shows hypermetabolic adenopathy in the left axilla, bilateral supraclavicular, AP window, mediastinum, and upper retroperitoneum.  In addition the cytology from the pleural effusion was positive for malignant cells.  He remains hoarse.  ALLERGIES:  is allergic to sulfa antibiotics.  Meds: Current Outpatient Medications  Medication Sig Dispense Refill   amLODipine (NORVASC) 2.5 MG tablet Take 2.5 mg by mouth in the morning.     Ascorbic Acid (VITAMIN C PO) Take 1 tablet by mouth daily with breakfast.     aspirin EC 81 MG tablet Take 81 mg by mouth in the morning.     B Complex Vitamins (VITAMIN-B COMPLEX) TABS Take 1 tablet by mouth daily with breakfast.     Cholecalciferol (VITAMIN D3) 50 MCG (2000 UT) TABS Take 2,000 Units by mouth  daily with breakfast.     gabapentin (NEURONTIN) 100 MG capsule Take 100 mg by mouth 3 (three) times daily as needed (PAIN.).     HYDROcodone-acetaminophen (NORCO/VICODIN) 5-325 MG tablet Take 1 tablet by mouth every 6 (six) hours as needed. 5 tablet 0   losartan (COZAAR) 100 MG tablet Take 50 mg by mouth 2 (two) times daily.      megestrol (MEGACE) 20 MG tablet Take 1 tablet (20 mg total) by mouth daily. 30 tablet 2   meloxicam (MOBIC) 15 MG tablet Take 15 mg by mouth in the morning.     naproxen sodium (ALEVE) 220 MG tablet Take 220 mg by mouth daily as needed (soreness with golfing).     Omega-3 Fatty Acids (FISH OIL PO) Take 1 capsule by mouth daily with breakfast.     POTASSIUM PO Take 1 tablet by mouth daily with breakfast.     Probiotic Product (PROBIOTIC PEARLS ADVANTAGE PO) Take 1 capsule by mouth in the morning.     silodosin (RAPAFLO) 4 MG CAPS capsule Take 1 capsule (4 mg total) by mouth daily as needed (urinary retention.).     No current facility-administered medications for this encounter.    Physical Findings: The patient is in no acute distress. Patient is alert and oriented.  No significant changes.  Lab Findings: Lab Results  Component Value Date  WBC 8.3 10/17/2022   HGB 13.1 10/17/2022   HCT 37.1 (L) 10/17/2022   PLT 170 10/17/2022    Lab Results  Component Value Date   NA 139 10/17/2022   K 4.4 10/17/2022   CO2 26 10/17/2022   GLUCOSE 111 (H) 10/17/2022   BUN 27 (H) 10/17/2022   CREATININE 1.20 04/02/2023   BILITOT 1.0 03/11/2019   ALKPHOS 86 03/11/2019   AST 29 03/11/2019   ALT 29 03/11/2019   PROT 7.1 03/11/2019   ALBUMIN 4.4 03/11/2019   CALCIUM 9.1 10/17/2022   ANIONGAP 10 10/17/2022    Radiographic Findings: NM PET Image Restag (PS) Skull Base To Thigh Result Date: 06/24/2023 CLINICAL DATA:  Subsequent treatment strategy for non-small cell lung cancer. EXAM: NUCLEAR MEDICINE PET SKULL BASE TO THIGH TECHNIQUE: 10.9 mCi F-18 FDG was injected  intravenously. Full-ring PET imaging was performed from the skull base to thigh after the radiotracer. CT data was obtained and used for attenuation correction and anatomic localization. Fasting blood glucose: 112 mg/dl COMPARISON:  PET-CT 16/03/9603, chest CT 06/06/2023 FINDINGS: Mediastinal blood pool activity: SUV max 2.6 Liver activity: SUV max NA NECK: Compared to PET-CT 08/14/2022, there is new hypermetabolic bilateral supraclavicular lymph nodes. For example RIGHT supraclavicular node measuring 11 mm (image 43) with SUV max equal 10.3 Incidental CT findings: None. CHEST: There are new hypermetabolic LEFT axillary and LEFT sub pectoralis lymph nodes. Example LEFT axillary node measuring 14 mm (image 62) SUV max equal 13. Hypermetabolic AP window node on image 67. masslike lesion in the LEFT upper lobe measures 3.9 cm with moderate metabolic activity (SUV max equal 5.7. Fiducial marker along the margin of the mass. Similar findings in the RIGHT lower lobe with 3.2 cm mass with moderate metabolic activity (SUV max equal 3.3). These treatment sites where small nodules on comparison PET-CT scan. Incidental CT findings: None. ABDOMEN/PELVIS: hypermetabolic lymph node in the upper abdomen adjacent to the celiac trunk measures 10 mm (image 116) with SUV max equal 5.5. Second similar lymph node adjacent to the SMA measures 9 mm on image 123 with SUV max equal 7.9. Incidental CT findings: None. SKELETON: Mild scattered metabolic activity within the thoracic and lumbar spine is favored benign. Sclerotic lesions in the LEFT sacrum and iliac bone are reduced in metabolic activity. Incidental CT findings: None. IMPRESSION: 1. Clear progression nodal metastasis with new hypermetabolic LEFT axillary nodes, LEFT sub pectoralis lymph nodes and bilateral hypermetabolic supraclavicular lymph nodes. 2. Moderate metabolic activity associated with the treatment sites in LEFT upper lobe and RIGHT lower lobe with masslike densities.  Indeterminate findings for post therapy change versus residual carcinoma. 3. New small nodal metastasis in the upper abdomen adjacent to the SMA and celiac trunk. 4. Decreased metabolic activity of sclerotic skeletal lesions in pelvis. Electronically Signed   By: Genevive Bi M.D.   On: 06/24/2023 09:35   DG Chest 1 View Result Date: 06/14/2023 CLINICAL DATA:  Status post thoracentesis. EXAM: CHEST  1 VIEW COMPARISON:  10/17/2022 chest x-ray.  Chest CT 06/06/2023 FINDINGS: Left upper lobe pulmonary lesion with localization marker in noted. There is also a known right retro hilar lesion with localization marker. No evidence for pneumothorax after left thoracentesis. No pulmonary edema or pleural effusion. The cardiopericardial silhouette is within normal limits for size. No acute bony abnormality. IMPRESSION: 1. No evidence for pneumothorax after left thoracentesis. 2. Known bilateral pulmonary lesions. Electronically Signed   By: Kennith Center M.D.   On: 06/14/2023 15:18   IR  THORACENTESIS ASP PLEURAL SPACE W/IMG GUIDE Result Date: 06/14/2023 INDICATION: Patient is a 82 y/o male with history of non small cell lung cancer. Patient has a left sided pleural effusion. Patient is scheduled for a diagnostic and therapeutic thoracentesis. EXAM: ULTRASOUND GUIDED left sided  THORACENTESIS MEDICATIONS: 10mL of lidocaine 1% COMPLICATIONS: None immediate. PROCEDURE: An ultrasound guided thoracentesis was thoroughly discussed with the patient and questions answered. The benefits, risks, alternatives and complications were also discussed. The patient understands and wishes to proceed with the procedure. Written consent was obtained. Ultrasound was performed to localize and mark an adequate pocket of fluid in the left chest. The area was then prepped and draped in the normal sterile fashion. 1% Lidocaine was used for local anesthesia. Under ultrasound guidance a 6 Fr Safe-T-Centesis catheter was introduced.  Thoracentesis was performed. The catheter was removed and a dressing applied. FINDINGS: A total of approximately 1.2 L of amber, blood tinged fluid was removed. Samples were sent to the laboratory as requested by the clinical team. IMPRESSION: Successful ultrasound guided left sided thoracentesis yielding 1.2 L of pleural fluid. Procedure performed by Philipp Ovens PA-C Electronically Signed   By: Malachy Moan M.D.   On: 06/14/2023 15:04   CT Chest W Contrast Result Date: 06/11/2023 CLINICAL DATA:  Staging non-small-cell lung cancer right lower lobe and left upper lobe. Prior XRT. Follow up axillary node. * Tracking Code: BO * EXAM: CT CHEST WITH CONTRAST TECHNIQUE: Multidetector CT imaging of the chest was performed during intravenous contrast administration. RADIATION DOSE REDUCTION: This exam was performed according to the departmental dose-optimization program which includes automated exposure control, adjustment of the mA and/or kV according to patient size and/or use of iterative reconstruction technique. CONTRAST:  75mL OMNIPAQUE IOHEXOL 300 MG/ML  SOLN COMPARISON:  CT scan 04/02/2023 and older. FINDINGS: Cardiovascular: Small pericardial effusion. Heart nonenlarged. Coronary artery calcifications are seen. The thoracic aorta has a normal course and caliber with scattered vascular calcifications. Bovine type aortic arch, normal variant. Mediastinum/Nodes: Prominent thyroid gland. Small right-sided thyroid subcentimeter cystic focus. No specific imaging follow up based on size and appearance. Slightly patulous thoracic esophagus. Previous left axillary lymph node again identified. Previously measured at 11 mm in short axis and today 12 mm on series 2, image 40. Slight increase in small chest wall nodes identified as well image 36 series 2 deep to the pectoralis muscle. There are some prominent low neck lymph nodes on the right side. Example series 2, image 11 measures 10 mm in short axis.  Previously 9 mm. Other nodes in this location are also slightly larger. No abnormal hilar or mediastinal nodes identified at this time. Lungs/Pleura: Moderate left pleural effusion, slightly increased from previous. There is a fiduciary marker in the areas of rounded opacity in the left upper lobe. The opacity today appears more confluence overall slightly larger. More extension back to the hilum as well. Area today on series 8, image 57 measures 3.2 x 5.7 cm. Previously 4.6 by 3.8 cm. Slightly different configuration as well. There is also a new tiny 4 mm nodule left lower lobe series 8, image 115. Fiduciary marker also seen in the posteroinferior right upper lobe with some nodular opacity. The level of thickening and opacities actually decreasing today with significant residual. Persistent bandlike opacity dependently in the right lower lobe. Atelectasis or scar is favored. Upper Abdomen: Stable slight thickening of the left adrenal gland with some nodularity. Right adrenal gland is preserved. Left upper quadrant splenule. Loop of colon extending  between the liver margin in the anterior abdominal wall, variant of distribution of bowel. Musculoskeletal: Scattered degenerative changes along the spine. There is sclerosis involving the posterior aspect of the right ninth rib progressive from previous examination. Please correlate with the history. A osseous metastatic lesion is possible. Whole-body bone scan may be useful as the next step in the workup to assess for this lesion as well assess for other foci. IMPRESSION: Slight increase in size of nodular opacity left upper lobe with slight increasing left-sided moderate pleural effusion. Fiduciary marker again seen. Nodular opacity in the right lung with a fiduciary marker actually appears slightly smaller today. Few tiny new left-sided lung nodules, indeterminate. Attention on follow-up Mildly enlarged left axillary/chest wall lymph node is similar to trace increased  in size. There are some increasing but still small left chest wall nodes as well. There is also a small prominent node along the low right neck, supraclavicular, jugular chain region. Developing sclerosis along the posterior aspect of the right ninth rib. A metastatic lesion is possible. Please correlate with any history or dedicated whole-body bone scan may be useful as the next step in the workup. Aortic Atherosclerosis (ICD10-I70.0). Electronically Signed   By: Karen Kays M.D.   On: 06/11/2023 12:54    Impression:  The patient appears to have recurrent lung cancer with disseminated regional adenopathy and malignant effusion.  He is due to see Dr. Arbutus Ped this week.  Plan:  Today, I spent 30 minutes talking to the patient and his wife.  I shared the difficult results with them and put the findings into perspective.  I anticipate he may need further molecular profiling before systemic therapy.  He may also need PleurX catheter if pleural effusion recurs.  His paralyzed left vocal cord may be amenable to palliative radiation to the AP window versus left TVC fixation with ENT.  _____________________________________  Artist Pais. Kathrynn Running, M.D.

## 2023-06-25 ENCOUNTER — Encounter: Payer: Self-pay | Admitting: Radiation Oncology

## 2023-06-25 DIAGNOSIS — C3412 Malignant neoplasm of upper lobe, left bronchus or lung: Secondary | ICD-10-CM | POA: Diagnosis not present

## 2023-06-25 NOTE — Progress Notes (Signed)
  Radiation Oncology         (336) [phone] ________________________________  Name: Chad Moreno MRN: [id number]  Date: 06/25/2023  DOB: [birth year]  Chart Note:  After speaking with this patient yesterday, I performed a literature review regarding treatment of malignant lymphadenopathy responsible for recurrent laryngeal nerve dysfunction producing left vocal cord paralysis.  There is some literature to suggest palliative radiation to the involved adenopathy can relieve cranial nerve dysfunction.  I would suspect that in some cases the cranial nerve is compressed and potentially still functional while in some cases the cranial nerve injury may be irreversible.  I discussed the possibility of palliative radiation to the AP window adenopathy seen on his recent PET scan with Dr. Gatha in terms of timing.  The patient will likely require molecular profiling prior to initiating systemic therapy which may result in a 10 to 14-day delay.  Dr. Gatha and I agreed that a brief course of radiation treatment may be worth considering during this interval.  I called the patient today to discuss this possible strategy.  The patient would like to proceed with local radiotherapy to the AP window node in hopes of palliating left vocal cord paralysis.  At this time, he will pause plans for vocal cord injections with Dr. Carlie of ENT.  If the patient responds to palliative radiation, we could cancel vocal cord injections.  If the patient does not respond to treatment to the mediastinal lymph nodes, he may ideally still be eligible for vocal cord injections in February or after.  The patient is agreeable to proceeding with radiation planning on Friday, January 3 at 3 PM with plans to start 8 fractions of radiation on January 7  ________________________________  Donnice LABOR. Patrcia, M.D.

## 2023-06-25 NOTE — Progress Notes (Signed)
Request for pt's pleural fluid be sent to [facility] for molecular testing emailed to Lita Mains, [facility] pathology tech.

## 2023-06-27 ENCOUNTER — Other Ambulatory Visit: Payer: Self-pay | Admitting: Medical Oncology

## 2023-06-27 ENCOUNTER — Inpatient Hospital Stay: Payer: Medicare Other

## 2023-06-27 ENCOUNTER — Other Ambulatory Visit: Payer: Medicare Other

## 2023-06-27 ENCOUNTER — Ambulatory Visit: Payer: Medicare Other | Admitting: Internal Medicine

## 2023-06-27 ENCOUNTER — Inpatient Hospital Stay: Payer: Medicare Other | Admitting: Internal Medicine

## 2023-06-27 VITALS — BP 200/88 | HR 74 | Temp 97.5°F | Resp 16 | Ht 73.0 in | Wt 217.5 lb

## 2023-06-27 DIAGNOSIS — M545 Low back pain, unspecified: Secondary | ICD-10-CM

## 2023-06-27 DIAGNOSIS — C3492 Malignant neoplasm of unspecified part of left bronchus or lung: Secondary | ICD-10-CM

## 2023-06-27 DIAGNOSIS — C3431 Malignant neoplasm of lower lobe, right bronchus or lung: Secondary | ICD-10-CM

## 2023-06-27 DIAGNOSIS — C3490 Malignant neoplasm of unspecified part of unspecified bronchus or lung: Secondary | ICD-10-CM | POA: Insufficient documentation

## 2023-06-27 DIAGNOSIS — Z8546 Personal history of malignant neoplasm of prostate: Secondary | ICD-10-CM | POA: Diagnosis not present

## 2023-06-27 DIAGNOSIS — Z1509 Genetic susceptibility to other malignant neoplasm: Secondary | ICD-10-CM | POA: Insufficient documentation

## 2023-06-27 DIAGNOSIS — J910 Malignant pleural effusion: Secondary | ICD-10-CM | POA: Diagnosis not present

## 2023-06-27 DIAGNOSIS — G8929 Other chronic pain: Secondary | ICD-10-CM

## 2023-06-27 DIAGNOSIS — C61 Malignant neoplasm of prostate: Secondary | ICD-10-CM | POA: Diagnosis not present

## 2023-06-27 DIAGNOSIS — C771 Secondary and unspecified malignant neoplasm of intrathoracic lymph nodes: Secondary | ICD-10-CM | POA: Insufficient documentation

## 2023-06-27 DIAGNOSIS — C778 Secondary and unspecified malignant neoplasm of lymph nodes of multiple regions: Secondary | ICD-10-CM | POA: Insufficient documentation

## 2023-06-27 DIAGNOSIS — J3800 Paralysis of vocal cords and larynx, unspecified: Secondary | ICD-10-CM | POA: Diagnosis not present

## 2023-06-27 DIAGNOSIS — I10 Essential (primary) hypertension: Secondary | ICD-10-CM | POA: Diagnosis not present

## 2023-06-27 DIAGNOSIS — Z923 Personal history of irradiation: Secondary | ICD-10-CM | POA: Diagnosis not present

## 2023-06-27 DIAGNOSIS — C3412 Malignant neoplasm of upper lobe, left bronchus or lung: Secondary | ICD-10-CM | POA: Insufficient documentation

## 2023-06-27 DIAGNOSIS — C3491 Malignant neoplasm of unspecified part of right bronchus or lung: Secondary | ICD-10-CM | POA: Diagnosis not present

## 2023-06-27 LAB — CBC WITH DIFFERENTIAL (CANCER CENTER ONLY)
Abs Immature Granulocytes: 0.03 10*3/uL (ref 0.00–0.07)
Basophils Absolute: 0 10*3/uL (ref 0.0–0.1)
Basophils Relative: 1 %
Eosinophils Absolute: 0 10*3/uL (ref 0.0–0.5)
Eosinophils Relative: 1 %
HCT: 34.6 % — ABNORMAL LOW (ref 39.0–52.0)
Hemoglobin: 12.9 g/dL — ABNORMAL LOW (ref 13.0–17.0)
Immature Granulocytes: 1 %
Lymphocytes Relative: 11 %
Lymphs Abs: 0.6 10*3/uL — ABNORMAL LOW (ref 0.7–4.0)
MCH: 32.7 pg (ref 26.0–34.0)
MCHC: 37.3 g/dL — ABNORMAL HIGH (ref 30.0–36.0)
MCV: 87.8 fL (ref 80.0–100.0)
Monocytes Absolute: 0.4 10*3/uL (ref 0.1–1.0)
Monocytes Relative: 8 %
Neutro Abs: 4.2 10*3/uL (ref 1.7–7.7)
Neutrophils Relative %: 78 %
Platelet Count: 147 10*3/uL — ABNORMAL LOW (ref 150–400)
RBC: 3.94 MIL/uL — ABNORMAL LOW (ref 4.22–5.81)
RDW: 12.4 % (ref 11.5–15.5)
Smear Review: NORMAL
WBC Count: 5.2 10*3/uL (ref 4.0–10.5)
nRBC: 0 % (ref 0.0–0.2)

## 2023-06-27 LAB — CMP (CANCER CENTER ONLY)
ALT: 15 U/L (ref 0–44)
AST: 20 U/L (ref 15–41)
Albumin: 4.3 g/dL (ref 3.5–5.0)
Alkaline Phosphatase: 84 U/L (ref 38–126)
Anion gap: 6 (ref 5–15)
BUN: 16 mg/dL (ref 8–23)
CO2: 28 mmol/L (ref 22–32)
Calcium: 9.6 mg/dL (ref 8.9–10.3)
Chloride: 107 mmol/L (ref 98–111)
Creatinine: 0.93 mg/dL (ref 0.61–1.24)
GFR, Estimated: >60 mL/min (ref >60)
Glucose, Bld: 118 mg/dL — ABNORMAL HIGH (ref 70–99)
Potassium: 4.5 mmol/L (ref 3.5–5.1)
Sodium: 141 mmol/L (ref 135–145)
Total Bilirubin: 0.8 mg/dL (ref 0.0–1.2)
Total Protein: 7 g/dL (ref 6.5–8.1)

## 2023-06-27 NOTE — Progress Notes (Signed)
 [facility] [phone]   [phone]  CONSULT NOTE  REFERRING PHYSICIAN: Dr. Searcy Overcast  REASON FOR CONSULTATION:  83 years old white male recently diagnosed with lung cancer.  HPI Chad Moreno is a 83 y.o. male with past medical history significant for osteoarthritis, gout, dyslipidemia, hypertension, lower back pain, ocular hypertension, history of prostate cancer with elevated PSA, histoplasmosis and cataract.  The patient was followed by Dr. Devere his urologist for the history of prostate cancer and rising PSA and he has PSMA PET scan on July 27, 2022 that showed the intense radiotracer activity within the prostate gland consistent with primary prostate adenocarcinoma with reduced tracer activity associated with the sclerotic lesions on both sides of the left SI joint favored degenerative lesion than metastasis.  There was also bilateral single greater than 1.0 cm pulmonary nodules but no radiotracer activity in this nodule and consistent with prostate cancer metastasis.  The patient had a PET scan on August 16, 2022 and that showed hypermetabolic right lower lobe pulmonary nodule and hypermetabolic left upper lobe pulmonary nodule that were indeterminate but concerning for primary bronchogenic carcinoma there was no metastatic adenopathy.  On August 27, 2022 the patient underwent video bronchoscopy with robotic assisted bronchoscopic navigation under the care of Dr. Shelah.  The final pathology [id number]: [id number]) from the right lower lobe as well as the right upper lobe and left upper lobe nodules were consistent with adenocarcinoma of lung primary.  The patient was treated with SBRT to these nodules under the care of Dr. Patrcia in March 2024.  He was followed by imaging studies and repeat CT scan of the chest on June 06, 2023 showed slight increase in the size of the nodular opacity of the left upper lobe with slight increasing  left-sided moderate pleural effusion.  The nodular opacity in the right lung appears slightly smaller.  There was mildly enlarged left axillary/chest wall lymph node similar to trace increased in size.  There are some increasing but still small left chest wall nodes as well and small prominent node along the lower right neck, supraclavicular, jugular chain region and developing sclerosis along the posterior aspect of the right ninth rib and metastatic lesion is possible.  On June 14, 2023 the patient underwent successful ultrasound-guided left sided thoracentesis yielding 1.2 cm liter of pleural fluid.  The final cytology ([id number])  of the pleural fluid was consistent with malignant adenocarcinoma of lung primary. The patient had a PET scan on June 15, 2023 and that showed clear progression of nodal metastasis with new hypermetabolic left axillary nodes, left subpectoral lymph nodes and bilateral hypermetabolic supraclavicular lymph nodes.  There was moderate metabolic activity associated with the treated sites in the left upper lobe and right lower lobe with masslike density.  There was new small nodal metastasis in the upper abdomen adjacent to the SMA and celiac trunk with decreased metabolic activity of the sclerotic skeletal lesions in the pelvis. The patient was referred to me today for evaluation and recommendation regarding treatment of his condition.  He was accompanied by his wife. Discussed the use of AI scribe software for clinical note transcription with the patient, who gave verbal consent to proceed.  History of Present Illness   The patient, an 83 year old individual with a history of prostate cancer diagnosed in January 2024, presents with a recent diagnosis of lung cancer. The lung cancer was incidentally discovered during a PET scan for prostate cancer in February 2024. The  patient was initially shocked by this diagnosis, having quit smoking in 1981 and having no significant  smoking history for over 40 years. The patient's family history is significant for cancer, with both parents having succumbed to metastatic cancer.  The patient underwent biopsies on 08/27/2022 by Dr. Shelah confirming adenocarcinoma of the lung and subsequently received three extensive radiation treatments to the right lower lobe and left upper lobe in May 2024. Concurrently, the patient completed 28 treatments for prostate cancer, finishing in June 2024. A follow-up CT scan showed some scar tissue and murkiness from radiation, but the lung cancer remained at stage one.  In November 2024, the patient developed hoarseness and was diagnosed with left vocal cord paralysis. Around the same time, lymph nodes under the right and left arms were found to be problematic. A PET scan in December 2024 revealed fluid around the left lung, which was drained and found to contain adenocarcinoma cells. The patient's lung cancer was subsequently restaged to stage four, indicating aggressive progression.  The patient reports feeling a little nauseated and short of breath, with some right-sided chest pain. There has been no hemoptysis, but the patient has experienced significant weight loss over the past ten months. The patient denies any headaches or changes in vision. The patient's past medical history is significant for hypertension, hypercholesterolemia, gout, and low back pain. The patient has no known drug allergies but has a noted sensitivity to long-term sulfa drugs.       HPI  Past Medical History:  Diagnosis Date   Age-related cataract of left eye    Amblyopia, right eye    Arthritis    Cancer (HCC)    Dysuria    ED (erectile dysfunction)    Elevated PSA    Epidermal cyst    Family history of prostate cancer    Gout    Hip pain    Histoplasmosis    Hyperlipidemia    Hypermetropia, left eye    Hypertension    Impaired glucose tolerance    Joint pain    Left shoulder pain    Lower back pain     Melanocytic nevi, unspecified    Nummular dermatitis    Ocular hypertension    Palmar fascial fibromatosis    Prostate cancer screening    Regular astigmatism, right eye    Senile nuclear sclerosis     Past Surgical History:  Procedure Laterality Date   BRONCHIAL BIOPSY  08/27/2022   Procedure: BRONCHIAL BIOPSIES;  Surgeon: Shelah Lamar RAMAN, MD;  Location: [facility];  Service: Pulmonary;;   BRONCHIAL BRUSHINGS  08/27/2022   Procedure: BRONCHIAL BRUSHINGS;  Surgeon: Shelah Lamar RAMAN, MD;  Location: [facility];  Service: Pulmonary;;   BRONCHIAL NEEDLE ASPIRATION BIOPSY  08/27/2022   Procedure: BRONCHIAL NEEDLE ASPIRATION BIOPSIES;  Surgeon: Shelah Lamar RAMAN, MD;  Location: [facility];  Service: Pulmonary;;   CONSTRICTING FINGER RING EXCISION Left    CYST REMOVAL TRUNK Left    left posterior shoulder   FIDUCIAL MARKER PLACEMENT  08/27/2022   Procedure: FIDUCIAL MARKER PLACEMENT;  Surgeon: Shelah Lamar RAMAN, MD;  Location: [facility];  Service: Pulmonary;;   GOLD SEED IMPLANT N/A 10/02/2022   Procedure: GOLD SEED IMPLANT;  Surgeon: Lovie Arlyss CROME, MD;  Location: [facility];  Service: Urology;  Laterality: N/A;   index finger Left    IR THORACENTESIS ASP PLEURAL SPACE W/IMG GUIDE  06/14/2023   Prostate needle biopsy     SPACE OAR INSTILLATION N/A 10/02/2022  Procedure: SPACE OAR INSTILLATION;  Surgeon: Lovie Arlyss CROME, MD;  Location: [facility];  Service: Urology;  Laterality: N/A;    Family History  Problem Relation Age of Onset   Prostate cancer Father     Social History Social History   Tobacco Use   Smoking status: Former    Current packs/day: 0.00    Average packs/day: 0.5 packs/day for 10.0 years (5.0 ttl pk-yrs)    Types: Cigarettes    Start date: 67    Quit date: 28    Years since quitting: 44.0   Smokeless tobacco: Never  Vaping Use   Vaping status: Never Used  Substance Use Topics   Alcohol  use: Yes    Comment: rare   Drug use:  Never    Allergies  Allergen Reactions   Sulfa Antibiotics Anaphylaxis    Current Outpatient Medications  Medication Sig Dispense Refill   amLODipine (NORVASC) 2.5 MG tablet Take 2.5 mg by mouth in the morning.     Ascorbic Acid  (VITAMIN C  PO) Take 1 tablet by mouth daily with breakfast.     aspirin EC 81 MG tablet Take 81 mg by mouth in the morning.     B Complex Vitamins (VITAMIN-B COMPLEX) TABS Take 1 tablet by mouth daily with breakfast.     Cholecalciferol (VITAMIN D3) 50 MCG (2000 UT) TABS Take 2,000 Units by mouth daily with breakfast.     gabapentin (NEURONTIN) 100 MG capsule Take 100 mg by mouth 3 (three) times daily as needed (PAIN.).     HYDROcodone -acetaminophen  (NORCO/VICODIN) 5-325 MG tablet Take 1 tablet by mouth every 6 (six) hours as needed. 5 tablet 0   losartan  (COZAAR ) 100 MG tablet Take 50 mg by mouth 2 (two) times daily.      megestrol  (MEGACE ) 20 MG tablet Take 1 tablet (20 mg total) by mouth daily. 30 tablet 2   meloxicam (MOBIC) 15 MG tablet Take 15 mg by mouth in the morning.     naproxen sodium (ALEVE) 220 MG tablet Take 220 mg by mouth daily as needed (soreness with golfing).     Omega-3 Fatty Acids (FISH OIL PO) Take 1 capsule by mouth daily with breakfast.     POTASSIUM PO Take 1 tablet by mouth daily with breakfast.     Probiotic Product (PROBIOTIC PEARLS ADVANTAGE PO) Take 1 capsule by mouth in the morning.     silodosin  (RAPAFLO ) 4 MG CAPS capsule Take 1 capsule (4 mg total) by mouth daily as needed (urinary retention.).     No current facility-administered medications for this visit.    Review of Systems  Constitutional: positive for fatigue and weight loss Eyes: negative Ears, nose, mouth, throat, and face: positive for hoarseness Respiratory: negative Cardiovascular: negative Gastrointestinal: negative Genitourinary:negative Integument/breast: negative Hematologic/lymphatic: negative Musculoskeletal:negative Neurological:  negative Behavioral/Psych: negative Endocrine: negative Allergic/Immunologic: negative  Physical Exam  MJO:jozmu, healthy, no distress, well nourished, well developed, and anxious SKIN: skin color, texture, turgor are normal, no rashes or significant lesions HEAD: Normocephalic, No masses, lesions, tenderness or abnormalities EYES: normal, PERRLA, Conjunctiva are pink and non-injected EARS: External ears normal, Canals clear OROPHARYNX:no exudate, no erythema, and lips, buccal mucosa, and tongue normal  NECK: supple, no adenopathy, no JVD LYMPH:  no palpable lymphadenopathy, no hepatosplenomegaly LUNGS: coarse sounds heard, decreased breath sounds HEART: regular rate & rhythm, no murmurs, and no gallops ABDOMEN:abdomen soft, non-tender, normal bowel sounds, and no masses or organomegaly BACK: Back symmetric, no curvature., No CVA tenderness EXTREMITIES:no  joint deformities, effusion, or inflammation, no edema  NEURO: alert & oriented x 3 with fluent speech, no focal motor/sensory deficits  PERFORMANCE STATUS: ECOG 1  LABORATORY DATA: Lab Results  Component Value Date   WBC 8.3 10/17/2022   HGB 13.1 10/17/2022   HCT 37.1 (L) 10/17/2022   MCV 89.4 10/17/2022   PLT 170 10/17/2022      Chemistry      Component Value Date/Time   NA 139 10/17/2022 1907   K 4.4 10/17/2022 1907   CL 103 10/17/2022 1907   CO2 26 10/17/2022 1907   BUN 27 (H) 10/17/2022 1907   CREATININE 1.20 04/02/2023 1010      Component Value Date/Time   CALCIUM 9.1 10/17/2022 1907   ALKPHOS 86 03/11/2019 2058   AST 29 03/11/2019 2058   ALT 29 03/11/2019 2058   BILITOT 1.0 03/11/2019 2058       RADIOGRAPHIC STUDIES: NM PET Image Restag (PS) Skull Base To Thigh Result Date: 06/24/2023 CLINICAL DATA:  Subsequent treatment strategy for non-small cell lung cancer. EXAM: NUCLEAR MEDICINE PET SKULL BASE TO THIGH TECHNIQUE: 10.9 mCi F-18 FDG was injected intravenously. Full-ring PET imaging was performed  from the skull base to thigh after the radiotracer. CT data was obtained and used for attenuation correction and anatomic localization. Fasting blood glucose: 112 mg/dl COMPARISON:  PET-CT 97/77/7975, chest CT 06/06/2023 FINDINGS: Mediastinal blood pool activity: SUV max 2.6 Liver activity: SUV max NA NECK: Compared to PET-CT 08/14/2022, there is new hypermetabolic bilateral supraclavicular lymph nodes. For example RIGHT supraclavicular node measuring 11 mm (image 43) with SUV max equal 10.3 Incidental CT findings: None. CHEST: There are new hypermetabolic LEFT axillary and LEFT sub pectoralis lymph nodes. Example LEFT axillary node measuring 14 mm (image 62) SUV max equal 13. Hypermetabolic AP window node on image 67. masslike lesion in the LEFT upper lobe measures 3.9 cm with moderate metabolic activity (SUV max equal 5.7. Fiducial marker along the margin of the mass. Similar findings in the RIGHT lower lobe with 3.2 cm mass with moderate metabolic activity (SUV max equal 3.3). These treatment sites where small nodules on comparison PET-CT scan. Incidental CT findings: None. ABDOMEN/PELVIS: hypermetabolic lymph node in the upper abdomen adjacent to the celiac trunk measures 10 mm (image 116) with SUV max equal 5.5. Second similar lymph node adjacent to the SMA measures 9 mm on image 123 with SUV max equal 7.9. Incidental CT findings: None. SKELETON: Mild scattered metabolic activity within the thoracic and lumbar spine is favored benign. Sclerotic lesions in the LEFT sacrum and iliac bone are reduced in metabolic activity. Incidental CT findings: None. IMPRESSION: 1. Clear progression nodal metastasis with new hypermetabolic LEFT axillary nodes, LEFT sub pectoralis lymph nodes and bilateral hypermetabolic supraclavicular lymph nodes. 2. Moderate metabolic activity associated with the treatment sites in LEFT upper lobe and RIGHT lower lobe with masslike densities. Indeterminate findings for post therapy change  versus residual carcinoma. 3. New small nodal metastasis in the upper abdomen adjacent to the SMA and celiac trunk. 4. Decreased metabolic activity of sclerotic skeletal lesions in pelvis. Electronically Signed   By: Jackquline Boxer M.D.   On: 06/24/2023 09:35   DG Chest 1 View Result Date: 06/14/2023 CLINICAL DATA:  Status post thoracentesis. EXAM: CHEST  1 VIEW COMPARISON:  10/17/2022 chest x-ray.  Chest CT 06/06/2023 FINDINGS: Left upper lobe pulmonary lesion with localization marker in noted. There is also a known right retro hilar lesion with localization marker. No evidence for pneumothorax  after left thoracentesis. No pulmonary edema or pleural effusion. The cardiopericardial silhouette is within normal limits for size. No acute bony abnormality. IMPRESSION: 1. No evidence for pneumothorax after left thoracentesis. 2. Known bilateral pulmonary lesions. Electronically Signed   By: Camellia Candle M.D.   On: 06/14/2023 15:18   IR THORACENTESIS ASP PLEURAL SPACE W/IMG GUIDE Result Date: 06/14/2023 INDICATION: Patient is a 83 y/o male with history of non small cell lung cancer. Patient has a left sided pleural effusion. Patient is scheduled for a diagnostic and therapeutic thoracentesis. EXAM: ULTRASOUND GUIDED left sided  THORACENTESIS MEDICATIONS: 10mL of lidocaine  1% COMPLICATIONS: None immediate. PROCEDURE: An ultrasound guided thoracentesis was thoroughly discussed with the patient and questions answered. The benefits, risks, alternatives and complications were also discussed. The patient understands and wishes to proceed with the procedure. Written consent was obtained. Ultrasound was performed to localize and mark an adequate pocket of fluid in the left chest. The area was then prepped and draped in the normal sterile fashion. 1% Lidocaine  was used for local anesthesia. Under ultrasound guidance a 6 Fr Safe-T-Centesis catheter was introduced. Thoracentesis was performed. The catheter was removed and  a dressing applied. FINDINGS: A total of approximately 1.2 L of amber, blood tinged fluid was removed. Samples were sent to the laboratory as requested by the clinical team. IMPRESSION: Successful ultrasound guided left sided thoracentesis yielding 1.2 L of pleural fluid. Procedure performed by Daved Hipp PA-C Electronically Signed   By: Wilkie Lent M.D.   On: 06/14/2023 15:04   CT Chest W Contrast Result Date: 06/11/2023 CLINICAL DATA:  Staging non-small-cell lung cancer right lower lobe and left upper lobe. Prior XRT. Follow up axillary node. * Tracking Code: BO * EXAM: CT CHEST WITH CONTRAST TECHNIQUE: Multidetector CT imaging of the chest was performed during intravenous contrast administration. RADIATION DOSE REDUCTION: This exam was performed according to the departmental dose-optimization program which includes automated exposure control, adjustment of the mA and/or kV according to patient size and/or use of iterative reconstruction technique. CONTRAST:  75mL OMNIPAQUE  IOHEXOL  300 MG/ML  SOLN COMPARISON:  CT scan 04/02/2023 and older. FINDINGS: Cardiovascular: Small pericardial effusion. Heart nonenlarged. Coronary artery calcifications are seen. The thoracic aorta has a normal course and caliber with scattered vascular calcifications. Bovine type aortic arch, normal variant. Mediastinum/Nodes: Prominent thyroid  gland. Small right-sided thyroid  subcentimeter cystic focus. No specific imaging follow up based on size and appearance. Slightly patulous thoracic esophagus. Previous left axillary lymph node again identified. Previously measured at 11 mm in short axis and today 12 mm on series 2, image 40. Slight increase in small chest wall nodes identified as well image 36 series 2 deep to the pectoralis muscle. There are some prominent low neck lymph nodes on the right side. Example series 2, image 11 measures 10 mm in short axis. Previously 9 mm. Other nodes in this location are also slightly  larger. No abnormal hilar or mediastinal nodes identified at this time. Lungs/Pleura: Moderate left pleural effusion, slightly increased from previous. There is a fiduciary marker in the areas of rounded opacity in the left upper lobe. The opacity today appears more confluence overall slightly larger. More extension back to the hilum as well. Area today on series 8, image 57 measures 3.2 x 5.7 cm. Previously 4.6 by 3.8 cm. Slightly different configuration as well. There is also a new tiny 4 mm nodule left lower lobe series 8, image 115. Fiduciary marker also seen in the posteroinferior right upper lobe with some nodular  opacity. The level of thickening and opacities actually decreasing today with significant residual. Persistent bandlike opacity dependently in the right lower lobe. Atelectasis or scar is favored. Upper Abdomen: Stable slight thickening of the left adrenal gland with some nodularity. Right adrenal gland is preserved. Left upper quadrant splenule. Loop of colon extending between the liver margin in the anterior abdominal wall, variant of distribution of bowel. Musculoskeletal: Scattered degenerative changes along the spine. There is sclerosis involving the posterior aspect of the right ninth rib progressive from previous examination. Please correlate with the history. A osseous metastatic lesion is possible. Whole-body bone scan may be useful as the next step in the workup to assess for this lesion as well assess for other foci. IMPRESSION: Slight increase in size of nodular opacity left upper lobe with slight increasing left-sided moderate pleural effusion. Fiduciary marker again seen. Nodular opacity in the right lung with a fiduciary marker actually appears slightly smaller today. Few tiny new left-sided lung nodules, indeterminate. Attention on follow-up Mildly enlarged left axillary/chest wall lymph node is similar to trace increased in size. There are some increasing but still small left chest  wall nodes as well. There is also a small prominent node along the low right neck, supraclavicular, jugular chain region. Developing sclerosis along the posterior aspect of the right ninth rib. A metastatic lesion is possible. Please correlate with any history or dedicated whole-body bone scan may be useful as the next step in the workup. Aortic Atherosclerosis (ICD10-I70.0). Electronically Signed   By: Ranell Bring M.D.   On: 06/11/2023 12:54    ASSESSMENT: This is a very pleasant 83 years old white male with recurrent/metastatic (T1c, N3, M1b) non-small cell lung cancer, adenocarcinoma initially diagnosed as early stage disease, stage I in March 2024 involving the right lower lobe and left upper lobe status post SBRT completed in May 2024.  The patient presented with recurrent and metastatic disease to bilateral supraclavicular, subpectoral, left axillary as well as upper abdominal lymphadenopathy and malignant left pleural effusion in December 2024 with confirmation of malignancy from the pleural effusion. Molecular studies are still pending.   PLAN: I had a lengthy discussion with the patient and his wife today about his current disease stage, prognosis and treatment options. I personally and independently reviewed the scan images and discussed the results with the patient and his wife. Assessment and Plan    Stage IV Non-Small Cell Lung Cancer (Adenocarcinoma) Recurrent lung cancer initially diagnosed as stage I in February 2024, now progressed to stage IV with metastasis to lymph nodes (bilateral supraclavicular, left axilla, upper abdomen), and pleural effusion with malignant cells. Aggressive cancer. Molecular marker testing planned to determine eligibility for targeted therapy. If no markers are found, chemotherapy and immunotherapy will be considered. Discussed non-curable nature of the disease but emphasized potential for prolonged and improved quality of life with treatment. Explained that  targeted therapy could avoid chemotherapy if molecular markers are present. Discussed the higher likelihood of molecular markers in non-smokers or those who quit long ago. If no markers, combination of chemotherapy and immunotherapy will be used. - Order blood test for molecular marker testing - Send biopsy and fluid samples for molecular marker testing - Order brain MRI to check for metastasis - Schedule follow-up appointment in 2-2.5 weeks to discuss molecular marker results and treatment plan - Continue radiation therapy as planned with Dr. Patrcia  Left Vocal Cord Paralysis Paralysis of the left vocal cord diagnosed in November 2024, likely secondary to metastatic lymph  node involvement. Persistent hoarseness. - Monitor symptoms and follow-up with ENT as needed  Prostate Cancer Diagnosed in January 2024, treated with 28 radiation sessions completed in June 2024. Currently well-managed with no new symptoms reported. - Continue monitoring as per urologist's recommendations  Hypertension Chronic condition, currently well-managed. No new symptoms reported. - Continue current antihypertensive medications  Hyperlipidemia Chronic condition, currently well-managed. No new symptoms reported. - Continue current lipid-lowering medications  Gout Chronic condition, currently well-managed. No new symptoms reported. - Continue current gout management medications  Low Back Pain Chronic condition, currently well-managed. No new symptoms reported. - Continue current pain management regimen  Histoplasmosis Inactive. No new symptoms reported. - No specific plan required at this time.    The patient voices understanding of current disease status and treatment options and is in agreement with the current care plan.  All questions were answered. The patient knows to call the clinic with any problems, questions or concerns. We can certainly see the patient much sooner if necessary.  Thank you so  much for allowing me to participate in the care of Chad Moreno. I will continue to follow up the patient with you and assist in his care.  The total time spent in the appointment was 90 minutes.  Disclaimer: This note was dictated with voice recognition software. Similar sounding words can inadvertently be transcribed and may not be corrected upon review.   [name] MARLA [name] June 27, 2023, 9:03 AM

## 2023-06-28 ENCOUNTER — Ambulatory Visit
Admission: RE | Admit: 2023-06-28 | Discharge: 2023-06-28 | Disposition: A | Discharge status: Home / Self Care | Payer: Medicare Other | Source: Ambulatory Visit | Attending: Radiation Oncology | Admitting: Radiation Oncology

## 2023-06-28 DIAGNOSIS — C3412 Malignant neoplasm of upper lobe, left bronchus or lung: Secondary | ICD-10-CM | POA: Diagnosis not present

## 2023-06-28 DIAGNOSIS — C61 Malignant neoplasm of prostate: Secondary | ICD-10-CM

## 2023-06-28 DIAGNOSIS — C3490 Malignant neoplasm of unspecified part of unspecified bronchus or lung: Secondary | ICD-10-CM | POA: Diagnosis not present

## 2023-06-28 DIAGNOSIS — C3492 Malignant neoplasm of unspecified part of left bronchus or lung: Secondary | ICD-10-CM | POA: Diagnosis not present

## 2023-06-28 DIAGNOSIS — C771 Secondary and unspecified malignant neoplasm of intrathoracic lymph nodes: Secondary | ICD-10-CM | POA: Diagnosis not present

## 2023-06-28 NOTE — Progress Notes (Signed)
  Radiation Oncology         [phone]) [phone] ________________________________  Name: LEVY CEDANO MRN: [id number]  Date: 06/28/2023  DOB: 1940/07/03  ULTRAHYPOFRACTIONATED RADIOTHERAPY  SIMULATION AND TREATMENT PLANNING NOTE    ICD-10-CM   1. Non-small cell lung cancer metastatic to intrathoracic lymph node (HCC)  C34.90    C77.1       DIAGNOSIS:  83 y/o man with vocal cord paralysis secondary to recurrent lung cancer with disseminated regional adenopathy and malignant effusion.   NARRATIVE:  The patient was brought to the CT Simulation planning suite.  Identity was confirmed.  All relevant records and images related to the planned course of therapy were reviewed.  The patient freely provided informed written consent to proceed with treatment after reviewing the details related to the planned course of therapy. The consent form was witnessed and verified by the simulation staff.  Then, the patient was set-up in a stable reproducible  supine position for radiation therapy.  A BodyFix immobilization pillow was fabricated for reproducible positioning.  Surface markings were placed.  The CT images were loaded into the planning software.  The gross target volumes (GTV) and planning target volumes (PTV) were delinieated, and avoidance structures were contoured.  Treatment planning then occurred.  The radiation prescription was entered and confirmed.  A total of two complex treatment devices were fabricated in the form of the BodyFix immobilization pillow and a neck accuform cushion.  I have requested : 3D Simulation  I have requested a DVH of the following structures: targets and all normal structures near the target including great vessels, esophagus, heart, spinal cord, chest wall, lungs, skin and others as noted on the radiation plan to maintain doses in adherence with established limits  SPECIAL TREATMENT PROCEDURE:  The planned course of therapy using radiation constitutes a special treatment  procedure. Special care is required in the management of this patient for the following reasons. High dose per fraction requiring special monitoring for increased toxicities of treatment including daily imaging..  The special nature of the planned course of radiotherapy will require increased physician supervision and oversight to ensure patient's safety with optimal treatment outcomes.    This requires extended time and effort.    PLAN:  The patient will receive 36 Gy in 8 fractions to the lymphadenopathy in the AP window in an effort to reverse left vocal cord paralysis.  ________________________________  Donnice FELIX Patrcia, M.D.

## 2023-07-01 ENCOUNTER — Telehealth: Payer: Self-pay | Admitting: Internal Medicine

## 2023-07-01 DIAGNOSIS — C61 Malignant neoplasm of prostate: Secondary | ICD-10-CM | POA: Diagnosis not present

## 2023-07-01 DIAGNOSIS — C3490 Malignant neoplasm of unspecified part of unspecified bronchus or lung: Secondary | ICD-10-CM | POA: Diagnosis not present

## 2023-07-01 DIAGNOSIS — C3492 Malignant neoplasm of unspecified part of left bronchus or lung: Secondary | ICD-10-CM | POA: Diagnosis not present

## 2023-07-01 DIAGNOSIS — C771 Secondary and unspecified malignant neoplasm of intrathoracic lymph nodes: Secondary | ICD-10-CM | POA: Diagnosis not present

## 2023-07-01 DIAGNOSIS — C3412 Malignant neoplasm of upper lobe, left bronchus or lung: Secondary | ICD-10-CM | POA: Diagnosis not present

## 2023-07-03 ENCOUNTER — Encounter (HOSPITAL_COMMUNITY): Payer: Self-pay

## 2023-07-03 ENCOUNTER — Ambulatory Visit: Payer: Medicare Other

## 2023-07-03 DIAGNOSIS — L720 Epidermal cyst: Secondary | ICD-10-CM | POA: Diagnosis not present

## 2023-07-03 DIAGNOSIS — L578 Other skin changes due to chronic exposure to nonionizing radiation: Secondary | ICD-10-CM | POA: Diagnosis not present

## 2023-07-03 DIAGNOSIS — L814 Other melanin hyperpigmentation: Secondary | ICD-10-CM | POA: Diagnosis not present

## 2023-07-03 DIAGNOSIS — D1801 Hemangioma of skin and subcutaneous tissue: Secondary | ICD-10-CM | POA: Diagnosis not present

## 2023-07-03 DIAGNOSIS — D229 Melanocytic nevi, unspecified: Secondary | ICD-10-CM | POA: Diagnosis not present

## 2023-07-03 DIAGNOSIS — L821 Other seborrheic keratosis: Secondary | ICD-10-CM | POA: Diagnosis not present

## 2023-07-03 DIAGNOSIS — L570 Actinic keratosis: Secondary | ICD-10-CM | POA: Diagnosis not present

## 2023-07-04 ENCOUNTER — Encounter: Payer: Self-pay | Admitting: Internal Medicine

## 2023-07-04 ENCOUNTER — Other Ambulatory Visit (HOSPITAL_COMMUNITY): Payer: Medicare Other

## 2023-07-04 DIAGNOSIS — C3490 Malignant neoplasm of unspecified part of unspecified bronchus or lung: Secondary | ICD-10-CM | POA: Diagnosis not present

## 2023-07-05 ENCOUNTER — Encounter (HOSPITAL_COMMUNITY): Payer: Self-pay

## 2023-07-05 LAB — GUARDANT 360

## 2023-07-10 ENCOUNTER — Other Ambulatory Visit: Payer: Self-pay

## 2023-07-10 ENCOUNTER — Ambulatory Visit
Admission: RE | Admit: 2023-07-10 | Discharge: 2023-07-10 | Disposition: A | Discharge status: Home / Self Care | Payer: Medicare Other | Source: Ambulatory Visit | Attending: Radiation Oncology | Admitting: Radiation Oncology

## 2023-07-10 ENCOUNTER — Ambulatory Visit: Payer: Medicare Other | Admitting: Urology

## 2023-07-10 DIAGNOSIS — C3490 Malignant neoplasm of unspecified part of unspecified bronchus or lung: Secondary | ICD-10-CM | POA: Diagnosis not present

## 2023-07-10 DIAGNOSIS — C3492 Malignant neoplasm of unspecified part of left bronchus or lung: Secondary | ICD-10-CM | POA: Diagnosis not present

## 2023-07-10 DIAGNOSIS — Z510 Encounter for antineoplastic radiation therapy: Secondary | ICD-10-CM | POA: Diagnosis not present

## 2023-07-10 DIAGNOSIS — C61 Malignant neoplasm of prostate: Secondary | ICD-10-CM | POA: Diagnosis not present

## 2023-07-10 DIAGNOSIS — C771 Secondary and unspecified malignant neoplasm of intrathoracic lymph nodes: Secondary | ICD-10-CM | POA: Diagnosis not present

## 2023-07-10 DIAGNOSIS — C3412 Malignant neoplasm of upper lobe, left bronchus or lung: Secondary | ICD-10-CM | POA: Diagnosis not present

## 2023-07-10 LAB — RAD ONC ARIA SESSION SUMMARY
Course Elapsed Days: 0
Plan Fractions Treated to Date: 1
Plan Prescribed Dose Per Fraction: 4.5 Gy
Plan Total Fractions Prescribed: 8
Plan Total Prescribed Dose: 36 Gy
Reference Point Dosage Given to Date: 4.5 Gy
Reference Point Session Dosage Given: 4.5 Gy
Session Number: 1

## 2023-07-11 ENCOUNTER — Ambulatory Visit: Payer: Medicare Other

## 2023-07-11 ENCOUNTER — Other Ambulatory Visit: Payer: Self-pay

## 2023-07-11 ENCOUNTER — Ambulatory Visit
Admission: RE | Admit: 2023-07-11 | Discharge: 2023-07-11 | Disposition: A | Discharge status: Home / Self Care | Payer: Medicare Other | Source: Ambulatory Visit | Attending: Radiation Oncology | Admitting: Radiation Oncology

## 2023-07-11 DIAGNOSIS — C771 Secondary and unspecified malignant neoplasm of intrathoracic lymph nodes: Secondary | ICD-10-CM | POA: Diagnosis not present

## 2023-07-11 DIAGNOSIS — C61 Malignant neoplasm of prostate: Secondary | ICD-10-CM | POA: Diagnosis not present

## 2023-07-11 DIAGNOSIS — C3490 Malignant neoplasm of unspecified part of unspecified bronchus or lung: Secondary | ICD-10-CM | POA: Diagnosis not present

## 2023-07-11 DIAGNOSIS — C3492 Malignant neoplasm of unspecified part of left bronchus or lung: Secondary | ICD-10-CM | POA: Diagnosis not present

## 2023-07-11 DIAGNOSIS — Z510 Encounter for antineoplastic radiation therapy: Secondary | ICD-10-CM | POA: Diagnosis not present

## 2023-07-11 DIAGNOSIS — C3412 Malignant neoplasm of upper lobe, left bronchus or lung: Secondary | ICD-10-CM | POA: Diagnosis not present

## 2023-07-11 LAB — RAD ONC ARIA SESSION SUMMARY
Course Elapsed Days: 1
Plan Fractions Treated to Date: 2
Plan Prescribed Dose Per Fraction: 4.5 Gy
Plan Total Fractions Prescribed: 8
Plan Total Prescribed Dose: 36 Gy
Reference Point Dosage Given to Date: 9 Gy
Reference Point Session Dosage Given: 4.5 Gy
Session Number: 2

## 2023-07-12 ENCOUNTER — Ambulatory Visit
Admission: RE | Admit: 2023-07-12 | Discharge: 2023-07-12 | Disposition: A | Discharge status: Home / Self Care | Payer: Medicare Other | Source: Ambulatory Visit | Attending: Radiation Oncology

## 2023-07-12 ENCOUNTER — Other Ambulatory Visit: Payer: Self-pay | Admitting: Radiation Oncology

## 2023-07-12 ENCOUNTER — Ambulatory Visit
Admission: RE | Admit: 2023-07-12 | Discharge: 2023-07-12 | Disposition: A | Discharge status: Home / Self Care | Payer: Medicare Other | Source: Ambulatory Visit | Attending: Radiation Oncology | Admitting: Radiation Oncology

## 2023-07-12 ENCOUNTER — Other Ambulatory Visit: Payer: Self-pay

## 2023-07-12 DIAGNOSIS — C3492 Malignant neoplasm of unspecified part of left bronchus or lung: Secondary | ICD-10-CM | POA: Diagnosis not present

## 2023-07-12 DIAGNOSIS — C61 Malignant neoplasm of prostate: Secondary | ICD-10-CM | POA: Diagnosis not present

## 2023-07-12 DIAGNOSIS — C771 Secondary and unspecified malignant neoplasm of intrathoracic lymph nodes: Secondary | ICD-10-CM | POA: Diagnosis not present

## 2023-07-12 DIAGNOSIS — C3490 Malignant neoplasm of unspecified part of unspecified bronchus or lung: Secondary | ICD-10-CM | POA: Diagnosis not present

## 2023-07-12 DIAGNOSIS — Z510 Encounter for antineoplastic radiation therapy: Secondary | ICD-10-CM | POA: Diagnosis not present

## 2023-07-12 DIAGNOSIS — C3412 Malignant neoplasm of upper lobe, left bronchus or lung: Secondary | ICD-10-CM | POA: Diagnosis not present

## 2023-07-12 LAB — RAD ONC ARIA SESSION SUMMARY
Course Elapsed Days: 2
Plan Fractions Treated to Date: 3
Plan Prescribed Dose Per Fraction: 4.5 Gy
Plan Total Fractions Prescribed: 8
Plan Total Prescribed Dose: 36 Gy
Reference Point Dosage Given to Date: 13.5 Gy
Reference Point Session Dosage Given: 4.5 Gy
Session Number: 3

## 2023-07-12 MED ORDER — SUCRALFATE 1 G PO TABS: 1.0000 g | ORAL_TABLET | Freq: Four times a day (QID) | ORAL | 1 refills | Status: DC

## 2023-07-15 ENCOUNTER — Ambulatory Visit
Admission: RE | Admit: 2023-07-15 | Discharge: 2023-07-15 | Disposition: A | Discharge status: Home / Self Care | Payer: Medicare Other | Source: Ambulatory Visit | Attending: Radiation Oncology | Admitting: Radiation Oncology

## 2023-07-15 ENCOUNTER — Other Ambulatory Visit: Payer: Self-pay

## 2023-07-15 ENCOUNTER — Ambulatory Visit: Payer: Medicare Other

## 2023-07-15 ENCOUNTER — Inpatient Hospital Stay: Payer: Medicare Other

## 2023-07-15 ENCOUNTER — Other Ambulatory Visit (HOSPITAL_COMMUNITY): Payer: Self-pay

## 2023-07-15 ENCOUNTER — Inpatient Hospital Stay (HOSPITAL_BASED_OUTPATIENT_CLINIC_OR_DEPARTMENT_OTHER): Payer: Medicare Other | Admitting: Internal Medicine

## 2023-07-15 ENCOUNTER — Telehealth: Payer: Self-pay | Admitting: Pharmacist

## 2023-07-15 ENCOUNTER — Ambulatory Visit (HOSPITAL_COMMUNITY)
Admission: RE | Admit: 2023-07-15 | Discharge: 2023-07-15 | Disposition: A | Discharge status: Home / Self Care | Payer: Medicare Other | Source: Ambulatory Visit | Attending: Internal Medicine | Admitting: Internal Medicine

## 2023-07-15 ENCOUNTER — Telehealth: Payer: Self-pay | Admitting: Pharmacy Technician

## 2023-07-15 VITALS — BP 149/80 | HR 80 | Temp 97.3°F | Resp 16 | Ht 73.0 in | Wt 216.5 lb

## 2023-07-15 DIAGNOSIS — Z510 Encounter for antineoplastic radiation therapy: Secondary | ICD-10-CM | POA: Diagnosis not present

## 2023-07-15 DIAGNOSIS — I700 Atherosclerosis of aorta: Secondary | ICD-10-CM | POA: Diagnosis not present

## 2023-07-15 DIAGNOSIS — C3490 Malignant neoplasm of unspecified part of unspecified bronchus or lung: Secondary | ICD-10-CM | POA: Diagnosis not present

## 2023-07-15 DIAGNOSIS — C3492 Malignant neoplasm of unspecified part of left bronchus or lung: Secondary | ICD-10-CM

## 2023-07-15 DIAGNOSIS — C771 Secondary and unspecified malignant neoplasm of intrathoracic lymph nodes: Secondary | ICD-10-CM | POA: Diagnosis not present

## 2023-07-15 DIAGNOSIS — C61 Malignant neoplasm of prostate: Secondary | ICD-10-CM | POA: Diagnosis not present

## 2023-07-15 DIAGNOSIS — C3412 Malignant neoplasm of upper lobe, left bronchus or lung: Secondary | ICD-10-CM | POA: Diagnosis not present

## 2023-07-15 DIAGNOSIS — J90 Pleural effusion, not elsewhere classified: Secondary | ICD-10-CM | POA: Diagnosis not present

## 2023-07-15 LAB — RAD ONC ARIA SESSION SUMMARY
Course Elapsed Days: 5
Plan Fractions Treated to Date: 4
Plan Prescribed Dose Per Fraction: 4.5 Gy
Plan Total Fractions Prescribed: 8
Plan Total Prescribed Dose: 36 Gy
Reference Point Dosage Given to Date: 18 Gy
Reference Point Session Dosage Given: 4.5 Gy
Session Number: 4

## 2023-07-15 LAB — CBC WITH DIFFERENTIAL (CANCER CENTER ONLY)
Abs Immature Granulocytes: 0.04 10*3/uL (ref 0.00–0.07)
Basophils Absolute: 0 10*3/uL (ref 0.0–0.1)
Basophils Relative: 1 %
Eosinophils Absolute: 0 10*3/uL (ref 0.0–0.5)
Eosinophils Relative: 0 %
HCT: 34.4 % — ABNORMAL LOW (ref 39.0–52.0)
Hemoglobin: 12.5 g/dL — ABNORMAL LOW (ref 13.0–17.0)
Immature Granulocytes: 1 %
Lymphocytes Relative: 8 %
Lymphs Abs: 0.5 10*3/uL — ABNORMAL LOW (ref 0.7–4.0)
MCH: 31.6 pg (ref 26.0–34.0)
MCHC: 36.3 g/dL — ABNORMAL HIGH (ref 30.0–36.0)
MCV: 87.1 fL (ref 80.0–100.0)
Monocytes Absolute: 0.6 10*3/uL (ref 0.1–1.0)
Monocytes Relative: 9 %
Neutro Abs: 4.9 10*3/uL (ref 1.7–7.7)
Neutrophils Relative %: 81 %
Platelet Count: 137 10*3/uL — ABNORMAL LOW (ref 150–400)
RBC: 3.95 MIL/uL — ABNORMAL LOW (ref 4.22–5.81)
RDW: 12.3 % (ref 11.5–15.5)
WBC Count: 6.1 10*3/uL (ref 4.0–10.5)
nRBC: 0 % (ref 0.0–0.2)

## 2023-07-15 LAB — CMP (CANCER CENTER ONLY)
ALT: 13 U/L (ref 0–44)
AST: 18 U/L (ref 15–41)
Albumin: 4.2 g/dL (ref 3.5–5.0)
Alkaline Phosphatase: 82 U/L (ref 38–126)
Anion gap: 6 (ref 5–15)
BUN: 21 mg/dL (ref 8–23)
CO2: 27 mmol/L (ref 22–32)
Calcium: 9.2 mg/dL (ref 8.9–10.3)
Chloride: 107 mmol/L (ref 98–111)
Creatinine: 1.14 mg/dL (ref 0.61–1.24)
GFR, Estimated: >60 mL/min (ref >60)
Glucose, Bld: 125 mg/dL — ABNORMAL HIGH (ref 70–99)
Potassium: 4.3 mmol/L (ref 3.5–5.1)
Sodium: 140 mmol/L (ref 135–145)
Total Bilirubin: 0.6 mg/dL (ref 0.0–1.2)
Total Protein: 6.8 g/dL (ref 6.5–8.1)

## 2023-07-15 MED ORDER — BINIMETINIB 15 MG PO TABS
45.0000 mg | ORAL_TABLET | Freq: Two times a day (BID) | ORAL | 3 refills | Status: DC
  Filled 2023-07-16 – 2023-07-18 (×3): qty 180, 30d supply, fill #0
  Filled 2023-08-08: qty 180, 30d supply, fill #1
  Filled 2023-09-09: qty 180, 30d supply, fill #2
  Filled 2023-10-09 (×2): qty 180, 30d supply, fill #3

## 2023-07-15 MED ORDER — OXYCODONE-ACETAMINOPHEN 5-325 MG PO TABS: 1.0000 | ORAL_TABLET | Freq: Three times a day (TID) | ORAL | 0 refills | Status: DC | PRN

## 2023-07-15 MED ORDER — ENCORAFENIB 75 MG PO CAPS
450.0000 mg | ORAL_CAPSULE | Freq: Every day | ORAL | 3 refills | Status: DC
  Filled 2023-07-16 – 2023-07-18 (×3): qty 180, 30d supply, fill #0
  Filled 2023-08-08: qty 180, 30d supply, fill #1
  Filled 2023-09-09: qty 180, 30d supply, fill #2
  Filled 2023-10-09 (×2): qty 180, 30d supply, fill #3

## 2023-07-15 NOTE — Progress Notes (Signed)
[facility] Telephone:([phone]) [phone]   Fax:(336) [phone]  OFFICE PROGRESS NOTE  Chad Greathouse, MD [address] [place] [address]  DIAGNOSIS:  Recurrent/metastatic (T1c, N3, M1b) non-small cell lung cancer, adenocarcinoma initially diagnosed as early stage disease, stage I in March 2024 involving the right lower lobe and left upper lobe status post SBRT completed in May 2024.  The patient presented with recurrent and metastatic disease to bilateral supraclavicular, subpectoral, left axillary as well as upper abdominal lymphadenopathy and malignant left pleural effusion in December 2024 with confirmation of malignancy from the pleural effusion.   Biomarker Findings HRD signature - Cannot Be Determined Microsatellite status - Cannot Be Determined ? Tumor Mutational Burden - Cannot Be Determined Genomic Findings For a complete list of the genes assayed, please refer to the Appendix. BRAF V600E CHEK2 R474C CTNNB1 S45C DNMT3A W330* PPARG E3K 7 Disease relevant genes with no reportable alterations: ALK, EGFR, ERBB2, KRAS, MET, RET, ROS1  PDL1 TPS 1%  PRIOR THERAPY: None  CURRENT THERAPY:  Encorafenib 450 mg p.o. twice daily and binimetinib 45 mg p.o. twice daily.  First dose expected in the next few days.  INTERVAL HISTORY: Chad Moreno 83 y.o. male returns to the clinic today for follow-up visit accompanied by his wife.Discussed the use of AI scribe software for clinical note transcription with the patient, who gave verbal consent to proceed.  History of Present Illness   Chad Moreno, an 83 year old patient, was initially diagnosed with stage 1A non-small cell lung adenocarcinoma in March 2024. Following radiation treatment in May 2024, the disease metastasized by December 2024, with evidence of lymph node involvement under the arm and upper abdomen as well as malignant pleural effusion. The patient has been undergoing radiation treatment for a tumor in  the left lung, which has been causing some discomfort in the chest area.  Over the past week and a half, the patient has noticed a progressive increase in breathlessness, even with minimal exertion such as walking from the chair to the kitchen. This has been accompanied by a feeling of tightness in the chest area. The patient also reports a decline in his ability to perform activities he could do two weeks ago, feeling more lethargic.  The patient has been experiencing intermittent pain in the chest area, which he has been managing with an old prescription of Percocet, taken once at night. The patient reports that this medication not only helps with the pain but also aids in sleep.  The patient has been found to have a biomarker, BRAF V600E, which opens up the possibility of targeted therapy. The patient has expressed a preference for this treatment option over chemotherapy.      MEDICAL HISTORY: Past Medical History:  Diagnosis Date   Age-related cataract of left eye    Amblyopia, right eye    Arthritis    Cancer (HCC)    Dysuria    ED (erectile dysfunction)    Elevated PSA    Epidermal cyst    Family history of prostate cancer    Gout    Hip pain    Histoplasmosis    Hyperlipidemia    Hypermetropia, left eye    Hypertension    Impaired glucose tolerance    Joint pain    Left shoulder pain    Lower back pain    Melanocytic nevi, unspecified    Nummular dermatitis    Ocular hypertension    Palmar fascial fibromatosis  Prostate cancer screening    Regular astigmatism, right eye    Senile nuclear sclerosis     ALLERGIES:  is allergic to sulfa antibiotics.  MEDICATIONS:  Current Outpatient Medications  Medication Sig Dispense Refill   amLODipine (NORVASC) 2.5 MG tablet Take 2.5 mg by mouth in the morning.     Ascorbic Acid (VITAMIN C PO) Take 1 tablet by mouth daily with breakfast.     aspirin EC 81 MG tablet Take 81 mg by mouth in the morning.     B Complex Vitamins  (VITAMIN-B COMPLEX) TABS Take 1 tablet by mouth daily with breakfast.     Cholecalciferol (VITAMIN D3) 50 MCG (2000 UT) TABS Take 2,000 Units by mouth daily with breakfast.     HYDROcodone-acetaminophen (NORCO/VICODIN) 5-325 MG tablet Take 1 tablet by mouth every 6 (six) hours as needed. 5 tablet 0   losartan (COZAAR) 100 MG tablet Take 50 mg by mouth 2 (two) times daily.      megestrol (MEGACE) 20 MG tablet Take 1 tablet (20 mg total) by mouth daily. 30 tablet 2   meloxicam (MOBIC) 15 MG tablet Take 15 mg by mouth in the morning.     naproxen sodium (ALEVE) 220 MG tablet Take 220 mg by mouth daily as needed (soreness with golfing).     Omega-3 Fatty Acids (FISH OIL PO) Take 1 capsule by mouth daily with breakfast.     POTASSIUM PO Take 1 tablet by mouth daily with breakfast.     Probiotic Product (PROBIOTIC PEARLS ADVANTAGE PO) Take 1 capsule by mouth in the morning.     silodosin (RAPAFLO) 4 MG CAPS capsule Take 1 capsule (4 mg total) by mouth daily as needed (urinary retention.).     sucralfate (CARAFATE) 1 g tablet Take 1 tablet (1 g total) by mouth 4 (four) times daily. Dissolve each tablet in 15 cc water before use. 120 tablet 1   No current facility-administered medications for this visit.    SURGICAL HISTORY:  Past Surgical History:  Procedure Laterality Date   BRONCHIAL BIOPSY  08/27/2022   Procedure: BRONCHIAL BIOPSIES;  Surgeon: Leslye Peer, MD;  Location: [facility] ENDOSCOPY;  Service: Pulmonary;;   BRONCHIAL BRUSHINGS  08/27/2022   Procedure: BRONCHIAL BRUSHINGS;  Surgeon: Leslye Peer, MD;  Location: [facility] ENDOSCOPY;  Service: Pulmonary;;   BRONCHIAL NEEDLE ASPIRATION BIOPSY  08/27/2022   Procedure: BRONCHIAL NEEDLE ASPIRATION BIOPSIES;  Surgeon: Leslye Peer, MD;  Location: [facility] ENDOSCOPY;  Service: Pulmonary;;   CONSTRICTING FINGER RING EXCISION Left    CYST REMOVAL TRUNK Left    left posterior shoulder   FIDUCIAL MARKER PLACEMENT  08/27/2022   Procedure: FIDUCIAL MARKER PLACEMENT;   Surgeon: Leslye Peer, MD;  Location: [facility] ENDOSCOPY;  Service: Pulmonary;;   GOLD SEED IMPLANT N/A 10/02/2022   Procedure: GOLD SEED IMPLANT;  Surgeon: Despina Arias, MD;  Location: [facility];  Service: Urology;  Laterality: N/A;   index finger Left    IR THORACENTESIS ASP PLEURAL SPACE W/IMG GUIDE  06/14/2023   Prostate needle biopsy     SPACE OAR INSTILLATION N/A 10/02/2022   Procedure: SPACE OAR INSTILLATION;  Surgeon: Despina Arias, MD;  Location: [facility];  Service: Urology;  Laterality: N/A;    REVIEW OF SYSTEMS:  Constitutional: positive for fatigue Eyes: negative Ears, nose, mouth, throat, and face: positive for hoarseness Respiratory: positive for cough, dyspnea on exertion, and pleurisy/chest pain Cardiovascular: negative Gastrointestinal: negative Genitourinary:negative Integument/breast: negative Hematologic/lymphatic:  negative Musculoskeletal:negative Neurological: negative Behavioral/Psych: negative Endocrine: negative Allergic/Immunologic: negative   PHYSICAL EXAMINATION: General appearance: alert, cooperative, fatigued, and no distress Head: Normocephalic, without obvious abnormality, atraumatic Neck: no adenopathy, no JVD, supple, symmetrical, trachea midline, and thyroid not enlarged, symmetric, no tenderness/mass/nodules Lymph nodes: Cervical, supraclavicular, and axillary nodes normal. Resp: diminished breath sounds LLL and dullness to percussion LLL Back: symmetric, no curvature. ROM normal. No CVA tenderness. Cardio: regular rate and rhythm, S1, S2 normal, no murmur, click, rub or gallop GI: soft, non-tender; bowel sounds normal; no masses,  no organomegaly Extremities: extremities normal, atraumatic, no cyanosis or edema Neurologic: Alert and oriented X 3, normal strength and tone. Normal symmetric reflexes. Normal coordination and gait  ECOG PERFORMANCE STATUS: 1 - Symptomatic but completely ambulatory  Blood  pressure (!) 166/80, pulse 80, temperature (!) 97.3 F (36.3 C), temperature source Temporal, resp. rate 16, height 6\' 1"  (1.854 m), weight 216 lb 8 oz (98.2 kg), SpO2 98%.  LABORATORY DATA: Lab Results  Component Value Date   WBC 6.1 07/15/2023   HGB 12.5 (L) 07/15/2023   HCT 34.4 (L) 07/15/2023   MCV 87.1 07/15/2023   PLT 137 (L) 07/15/2023      Chemistry      Component Value Date/Time   NA 141 06/27/2023 1112   K 4.5 06/27/2023 1112   CL 107 06/27/2023 1112   CO2 28 06/27/2023 1112   BUN 16 06/27/2023 1112   CREATININE 0.93 06/27/2023 1112      Component Value Date/Time   CALCIUM 9.6 06/27/2023 1112   ALKPHOS 84 06/27/2023 1112   AST 20 06/27/2023 1112   ALT 15 06/27/2023 1112   BILITOT 0.8 06/27/2023 1112       RADIOGRAPHIC STUDIES: NM PET Image Restag (PS) Skull Base To Thigh Result Date: 06/24/2023 CLINICAL DATA:  Subsequent treatment strategy for non-small cell lung cancer. EXAM: NUCLEAR MEDICINE PET SKULL BASE TO THIGH TECHNIQUE: 10.9 mCi F-18 FDG was injected intravenously. Full-ring PET imaging was performed from the skull base to thigh after the radiotracer. CT data was obtained and used for attenuation correction and anatomic localization. Fasting blood glucose: 112 mg/dl COMPARISON:  PET-CT 03/47/4259, chest CT 06/06/2023 FINDINGS: Mediastinal blood pool activity: SUV max 2.6 Liver activity: SUV max NA NECK: Compared to PET-CT 08/14/2022, there is new hypermetabolic bilateral supraclavicular lymph nodes. For example RIGHT supraclavicular node measuring 11 mm (image 43) with SUV max equal 10.3 Incidental CT findings: None. CHEST: There are new hypermetabolic LEFT axillary and LEFT sub pectoralis lymph nodes. Example LEFT axillary node measuring 14 mm (image 62) SUV max equal 13. Hypermetabolic AP window node on image 67. masslike lesion in the LEFT upper lobe measures 3.9 cm with moderate metabolic activity (SUV max equal 5.7. Fiducial marker along the margin of the  mass. Similar findings in the RIGHT lower lobe with 3.2 cm mass with moderate metabolic activity (SUV max equal 3.3). These treatment sites where small nodules on comparison PET-CT scan. Incidental CT findings: None. ABDOMEN/PELVIS: hypermetabolic lymph node in the upper abdomen adjacent to the celiac trunk measures 10 mm (image 116) with SUV max equal 5.5. Second similar lymph node adjacent to the SMA measures 9 mm on image 123 with SUV max equal 7.9. Incidental CT findings: None. SKELETON: Mild scattered metabolic activity within the thoracic and lumbar spine is favored benign. Sclerotic lesions in the LEFT sacrum and iliac bone are reduced in metabolic activity. Incidental CT findings: None. IMPRESSION: 1. Clear progression nodal metastasis with new hypermetabolic  LEFT axillary nodes, LEFT sub pectoralis lymph nodes and bilateral hypermetabolic supraclavicular lymph nodes. 2. Moderate metabolic activity associated with the treatment sites in LEFT upper lobe and RIGHT lower lobe with masslike densities. Indeterminate findings for post therapy change versus residual carcinoma. 3. New small nodal metastasis in the upper abdomen adjacent to the SMA and celiac trunk. 4. Decreased metabolic activity of sclerotic skeletal lesions in pelvis. Electronically Signed   By: Genevive Bi M.D.   On: 06/24/2023 09:35    ASSESSMENT AND PLAN: This is a very pleasant 83 years old white male with Recurrent/metastatic (T1c, N3, M1b) non-small cell lung cancer, adenocarcinoma initially diagnosed as early stage disease, stage I in March 2024 involving the right lower lobe and left upper lobe status post SBRT completed in May 2024.  The patient presented with recurrent and metastatic disease to bilateral supraclavicular, subpectoral, left axillary as well as upper abdominal lymphadenopathy and malignant left pleural effusion in December 2024 with confirmation of malignancy from the pleural effusion. Molecular studies by  foundation 1 showed positive BRAF V600E mutation and PD-L1 expression of 1%. I had a lengthy discussion with the patient and his wife today about his current condition and treatment options.    Metastatic Non-Small Cell Lung Cancer (NSCLC) - Adenocarcinoma   Initially diagnosed in March 2024 as stage 1A, progressed to stage IV with lymph node and pleural effusion metastasis by December 2024. Reports increased dyspnea and lethargy likely due to reaccumulation of left-sided pleural fluid. Biomarker BRAF V600E identified, making him eligible for targeted therapy. Discussed treatment options: oral targeted therapy (encorafenib and Binimetinib or dabrafenib and trametinib) with a 60-65% response rate versus chemotherapy (carboplatin, Alimta, Keytruda) with a 40-50% response rate. Prefers targeted therapy due to fewer side effects and convenience.   - Order chest X-ray to assess pleural fluid reaccumulation   - Schedule thoracentesis for pleural fluid drainage   - Consider pleurx catheter placement if pleural fluid reaccumulates rapidly   - Initiate targeted therapy with either Encorafenib 450 mg po bid and  Binimetinib 45 mg po bid.   - Refer to pharmacist for medication counseling and insurance coordination   - Prescribe Percocet for pain management, to be taken every 8 hours as needed   - Follow-up in 2-2.5 weeks to monitor response to targeted therapy and manage any side effects    Pleural Effusion   Reaccumulation of left-sided pleural fluid causing increased dyspnea and lethargy. Previous thoracentesis performed. Discussed thoracentesis procedure and potential need for Pleurx catheter if fluid reaccumulates rapidly.   - Order chest X-ray to confirm reaccumulation   - Schedule thoracentesis for fluid drainage   - Consider PleurX catheter placement if fluid reaccumulates rapidly    Pain Management   Intermittent pain managed previously with a relative's medication. Reports pain relief and improved  sleep with medication. Discussed prescribing Percocet for pain management.   - Prescribe Percocet to be taken every 8 hours as needed for pain    Follow-up   - Follow-up in 2-2.5 weeks   - Contact if any side effects occur.   The patient was advised to call immediately if he has any other concerning symptoms in the interval. The patient voices understanding of current disease status and treatment options and is in agreement with the current care plan.  All questions were answered. The patient knows to call the clinic with any problems, questions or concerns. We can certainly see the patient much sooner if necessary.  The total time spent  in the appointment was 55 minutes.  Disclaimer: This note was dictated with voice recognition software. Similar sounding words can inadvertently be transcribed and may not be corrected upon review.

## 2023-07-15 NOTE — Progress Notes (Signed)
START ON PATHWAY REGIMEN - Non-Small Cell Lung     A cycle is every 28 days:     Binimetinib      Encorafenib   **Always confirm dose/schedule in your pharmacy ordering system**  Patient Characteristics: Stage IV Metastatic, Nonsquamous, Molecular Analysis Completed, Molecular Alteration Present and Eligible for Molecular Targeted Therapy, Initial Molecular Targeted Therapy, BRAF V600E Mutation Positive Therapeutic Status: Stage IV Metastatic Histology: Nonsquamous Cell Broad Molecular Profiling Status: Molecular Analysis Completed Molecular Analysis Results: Alteration Present and Eligible for Molecular Targeted Therapy Molecular Alteration Present: BRAF V600E Mutation Positive Molecular Targeted Line of Therapy: Initial Molecular Targeted Therapy Intent of Therapy: Non-Curative / Palliative Intent, Discussed with Patient

## 2023-07-15 NOTE — Telephone Encounter (Signed)
Oral Oncology Patient Advocate Encounter  After completing a benefits investigation, prior authorization for Braftovi & Johney [place] is not required at this time through [facility].  Patient's copay is $1,907.64 .    Patient is currently pending a determination from the [employer] for a grant to cover this cost.  Jinger Neighbors, CPhT-Adv Oncology Pharmacy Patient Advocate [facility] Direct Number: [phone]  Fax: [phone]

## 2023-07-15 NOTE — Telephone Encounter (Signed)
Oral Oncology Pharmacist Encounter  Received new prescription for Braftovi (encorafenib) and Mektovi (binimetinib) for the treatment of metastatic non-small cell lung cancer, BRAF V600E mutated, planned duration until disease progression or unacceptable drug toxicity.  CBC w/ Diff and CMP from 07/15/23 assessed, no baseline dose adjustments required at this time. Prescription dose and frequency assessed for appropriateness.  Current medication list in Epic reviewed, DDIs with Braftovi identified: Category C drug-drug interaction between Bravtovi and Amlodipine - Braftovi, a strong CYP3A4 inducer may decrease serum concentrations of amlodipine. Recommend monitoring patient for increased BP (noted patient BP 149/80 mmHg at MD office visit on 07/15/23) and monitoring for possible adjustment of amlodipine dose. No changes in therapy required at this time. Category C drug-drug interaction between Braftovi and Oxycodone/APAP - Braftovi may decrease serum concentrations, thus pain relief from oxycodone, via strong CYP3A4 induction. Recommend monitoring patient's pain levels and dose adjusting oxycodone as needed. No changes in therapy required at this time.  Evaluated chart and no patient barriers to medication adherence noted.   Patient agreement for treatment documented in MD note on 07/15/23.  Prescription has been e-scribed to the [facility] for benefits analysis and approval.  Oral Oncology Clinic will continue to follow for insurance authorization, copayment issues, initial counseling and start date.  Sherry Ruffing, PharmD, BCPS, BCOP Hematology/Oncology Clinical Pharmacist [facility] Oral Chemotherapy Navigation Clinics [phone] 07/15/2023 2:39 PM

## 2023-07-16 ENCOUNTER — Other Ambulatory Visit: Payer: Self-pay

## 2023-07-16 ENCOUNTER — Other Ambulatory Visit: Payer: Self-pay | Admitting: Pharmacy Technician

## 2023-07-16 ENCOUNTER — Ambulatory Visit
Admission: RE | Admit: 2023-07-16 | Discharge: 2023-07-16 | Disposition: A | Discharge status: Home / Self Care | Payer: Medicare Other | Source: Ambulatory Visit | Attending: Radiation Oncology

## 2023-07-16 ENCOUNTER — Telehealth: Payer: Self-pay | Admitting: Pharmacy Technician

## 2023-07-16 ENCOUNTER — Telehealth: Payer: Self-pay | Admitting: Internal Medicine

## 2023-07-16 ENCOUNTER — Other Ambulatory Visit (HOSPITAL_COMMUNITY): Payer: Self-pay

## 2023-07-16 ENCOUNTER — Other Ambulatory Visit: Payer: Self-pay | Admitting: Internal Medicine

## 2023-07-16 DIAGNOSIS — I5032 Chronic diastolic (congestive) heart failure: Secondary | ICD-10-CM

## 2023-07-16 DIAGNOSIS — C3490 Malignant neoplasm of unspecified part of unspecified bronchus or lung: Secondary | ICD-10-CM | POA: Diagnosis not present

## 2023-07-16 DIAGNOSIS — C61 Malignant neoplasm of prostate: Secondary | ICD-10-CM | POA: Diagnosis not present

## 2023-07-16 DIAGNOSIS — C3412 Malignant neoplasm of upper lobe, left bronchus or lung: Secondary | ICD-10-CM | POA: Diagnosis not present

## 2023-07-16 DIAGNOSIS — C3492 Malignant neoplasm of unspecified part of left bronchus or lung: Secondary | ICD-10-CM | POA: Diagnosis not present

## 2023-07-16 DIAGNOSIS — Z510 Encounter for antineoplastic radiation therapy: Secondary | ICD-10-CM | POA: Diagnosis not present

## 2023-07-16 DIAGNOSIS — C771 Secondary and unspecified malignant neoplasm of intrathoracic lymph nodes: Secondary | ICD-10-CM | POA: Diagnosis not present

## 2023-07-16 LAB — RAD ONC ARIA SESSION SUMMARY
Course Elapsed Days: 6
Plan Fractions Treated to Date: 5
Plan Prescribed Dose Per Fraction: 4.5 Gy
Plan Total Fractions Prescribed: 8
Plan Total Prescribed Dose: 36 Gy
Reference Point Dosage Given to Date: 22.5 Gy
Reference Point Session Dosage Given: 4.5 Gy
Session Number: 5

## 2023-07-16 NOTE — Progress Notes (Signed)
Specialty Pharmacy Initial Fill Coordination Note  Chad Moreno is a 83 y.o. male contacted today regarding refills of specialty medication(s) Binimetinib (MEKTOVI); Encorafenib (BRAFTOVI) .  Patient requested Delivery  on 07/19/23  to verified address [address]   [place] [address]-   Medication will be filled on 07/18/23.   Patient is aware of $5 copayment.

## 2023-07-16 NOTE — Telephone Encounter (Signed)
Oral Oncology Patient Advocate Encounter   Was successful in securing patient a $ 2,000 grant from [employer] to provide copayment coverage for Braftovi & Mektovi.  The patient's out of pocket cost will be $5 monthly.     I have spoken with the patient.    The billing information is as follows and has been shared with [facility].   Member ID: [id number] Group ID: [id number] RxBin: [id number] Dates of Eligibility: 07/16/23 through 06/24/24   Jinger Neighbors, CPhT-Adv Oncology Pharmacy Patient Advocate [facility] Direct Number: [phone]  Fax: [phone]

## 2023-07-16 NOTE — Progress Notes (Signed)
Oral Chemotherapy Pharmacist Encounter  Patient was counseled under telephone encounter from 07/15/23.  Sherry Ruffing, PharmD, BCPS, BCOP Hematology/Oncology Clinical Pharmacist [facility] Oral Chemotherapy Navigation Clinics [phone] 07/16/2023 4:07 PM

## 2023-07-16 NOTE — Telephone Encounter (Signed)
Oral Chemotherapy Pharmacist Encounter  I spoke with patient for overview of: Braftovi and Mektovi.   Counseled patient on administration, dosing, side effects, monitoring, drug-food interactions, safe handling, storage, and disposal.  Patient will take Braftovi 75 mg capsules, 6 capsules (450 mg) by mouth once daily, without regard to food.  Patient will take Mektovi 15 mg tablets, 3 tablets (45 mg) by mouth 2 times daily, without regard to food, approximately 12 hours apart.  Patient knows to avoid grapefruit and grapefruit juice while on therapy with Braftovi and Mektovi.  Braftovi and Mektovi start date: 07/22/23  Adverse effects of Braftovi include but are not limited to: Fatigue, N/V, abdominal pain, arthralgia, headache, dizziness, hyperkeratosis or HFS, uveitis (eye pain, photophobia, vision changes), hemorrhage, and QTc prolongation.  Adverse effects of Mektovi include but are not limited to: Fatigue, N/V, diarrhea, increased creatinine, increased LFTs, fever, rash, vision changes, peripheral edema, cardiac dysfunction, and hemorrhage  Hand-foot syndrome: discussed use of cream such as Udderly Smooth Extra Care 20 or equivalent advanced care cream that has 20% urea content for advanced skin hydration while on both Braftovi and Mektovi. Diarrhea: Patient will obtain Imodium (loperamide) to have on hand if they experience diarrhea. Patient knows to alert the office of 4 or more loose stools above baseline. Baseline ECHO will be obtained on 07/19/23 prior to patient starting therapy due to risk of cardiomyopathy with both agents.   Reviewed with patient importance of keeping a medication schedule and plan for any missed doses. No barriers to medication adherence identified.  Patient understands that Braftovi and Johney Maine are used in combination.  Medication reconciliation performed and medication/allergy list updated.  All questions answered.  Mr. Toso voiced understanding and  appreciation.   Medication education handout emailed to patient at his request. Patient knows to call the office with questions or concerns. Oral Chemotherapy Clinic phone number provided to patient.   Sherry Ruffing, PharmD, BCPS, BCOP Hematology/Oncology Clinical Pharmacist [facility] Oral Chemotherapy Navigation Clinics [phone] 07/16/2023 10:15 AM

## 2023-07-17 ENCOUNTER — Other Ambulatory Visit: Payer: Self-pay

## 2023-07-17 ENCOUNTER — Telehealth: Payer: Self-pay

## 2023-07-17 ENCOUNTER — Ambulatory Visit
Admission: RE | Admit: 2023-07-17 | Discharge: 2023-07-17 | Disposition: A | Discharge status: Home / Self Care | Payer: Medicare Other | Source: Ambulatory Visit | Attending: Radiation Oncology | Admitting: Radiation Oncology

## 2023-07-17 DIAGNOSIS — C3412 Malignant neoplasm of upper lobe, left bronchus or lung: Secondary | ICD-10-CM | POA: Diagnosis not present

## 2023-07-17 DIAGNOSIS — Z510 Encounter for antineoplastic radiation therapy: Secondary | ICD-10-CM | POA: Diagnosis not present

## 2023-07-17 DIAGNOSIS — C3492 Malignant neoplasm of unspecified part of left bronchus or lung: Secondary | ICD-10-CM | POA: Diagnosis not present

## 2023-07-17 DIAGNOSIS — C771 Secondary and unspecified malignant neoplasm of intrathoracic lymph nodes: Secondary | ICD-10-CM | POA: Diagnosis not present

## 2023-07-17 DIAGNOSIS — C3490 Malignant neoplasm of unspecified part of unspecified bronchus or lung: Secondary | ICD-10-CM | POA: Diagnosis not present

## 2023-07-17 DIAGNOSIS — C61 Malignant neoplasm of prostate: Secondary | ICD-10-CM | POA: Diagnosis not present

## 2023-07-17 LAB — RAD ONC ARIA SESSION SUMMARY
Course Elapsed Days: 7
Plan Fractions Treated to Date: 6
Plan Prescribed Dose Per Fraction: 4.5 Gy
Plan Total Fractions Prescribed: 8
Plan Total Prescribed Dose: 36 Gy
Reference Point Dosage Given to Date: 27 Gy
Reference Point Session Dosage Given: 4.5 Gy
Session Number: 6

## 2023-07-17 NOTE — Telephone Encounter (Signed)
Patient called and said someone left him a voicemail and stated that he needed to call Dr. Asa Lente office. Informed patient Dr. Arbutus Ped is gone for the day and this message will be routed to him for further evaluation.

## 2023-07-18 ENCOUNTER — Telehealth: Payer: Self-pay | Admitting: Pharmacist

## 2023-07-18 ENCOUNTER — Other Ambulatory Visit: Payer: Self-pay

## 2023-07-18 ENCOUNTER — Ambulatory Visit
Admission: RE | Admit: 2023-07-18 | Discharge: 2023-07-18 | Disposition: A | Discharge status: Home / Self Care | Payer: Medicare Other | Source: Ambulatory Visit | Attending: Radiation Oncology | Admitting: Radiation Oncology

## 2023-07-18 ENCOUNTER — Ambulatory Visit: Payer: Medicare Other | Admitting: Emergency Medicine

## 2023-07-18 ENCOUNTER — Encounter: Payer: Self-pay | Admitting: Emergency Medicine

## 2023-07-18 ENCOUNTER — Other Ambulatory Visit (HOSPITAL_COMMUNITY): Payer: Self-pay

## 2023-07-18 VITALS — BP 180/85 | HR 76 | Ht 72.0 in | Wt 216.4 lb

## 2023-07-18 DIAGNOSIS — J90 Pleural effusion, not elsewhere classified: Secondary | ICD-10-CM | POA: Diagnosis not present

## 2023-07-18 DIAGNOSIS — C3412 Malignant neoplasm of upper lobe, left bronchus or lung: Secondary | ICD-10-CM | POA: Diagnosis not present

## 2023-07-18 DIAGNOSIS — C3490 Malignant neoplasm of unspecified part of unspecified bronchus or lung: Secondary | ICD-10-CM | POA: Diagnosis not present

## 2023-07-18 DIAGNOSIS — C3492 Malignant neoplasm of unspecified part of left bronchus or lung: Secondary | ICD-10-CM | POA: Diagnosis not present

## 2023-07-18 DIAGNOSIS — Z510 Encounter for antineoplastic radiation therapy: Secondary | ICD-10-CM | POA: Diagnosis not present

## 2023-07-18 DIAGNOSIS — C61 Malignant neoplasm of prostate: Secondary | ICD-10-CM | POA: Diagnosis not present

## 2023-07-18 DIAGNOSIS — C771 Secondary and unspecified malignant neoplasm of intrathoracic lymph nodes: Secondary | ICD-10-CM | POA: Diagnosis not present

## 2023-07-18 LAB — RAD ONC ARIA SESSION SUMMARY
Course Elapsed Days: 8
Plan Fractions Treated to Date: 7
Plan Prescribed Dose Per Fraction: 4.5 Gy
Plan Total Fractions Prescribed: 8
Plan Total Prescribed Dose: 36 Gy
Reference Point Dosage Given to Date: 31.5 Gy
Reference Point Session Dosage Given: 4.5 Gy
Session Number: 7

## 2023-07-18 NOTE — Assessment & Plan Note (Signed)
Patient has stage IV disease and is planning for targeted therapy with Dr. Arbutus Ped.  He is also having XRT to his recurrent nerve for impact of lymphadenopathy on his vocal cord function causing hoarseness.  We did discuss his encorafenib and binimetinib, particularly side effects that would impact pulmonary function including pneumonitis or pleural effusion.

## 2023-07-18 NOTE — Patient Instructions (Signed)
Follow with Dr Arbutus Ped and Dr Kathrynn Running as planned.  Please call us or Dr Arbutus Ped if you need our assistance setting up a thoracentesis.  Follow with Dr Delton Coombes in 6 months, sooner if you have any problems.

## 2023-07-18 NOTE — Assessment & Plan Note (Signed)
Malignant left pleural effusion.  It has reaccumulated on his most recent chest x-ray.  I did talk to him about therapeutic thoracentesis.  He wanted to hold off for now but will let me or Dr. Arbutus Ped know if he wants to do this.  We also discussed the potential role for Pleurx catheter should his pleural effusion continue to accumulate even after he gets on targeted therapy.

## 2023-07-18 NOTE — Telephone Encounter (Signed)
Oral Chemotherapy Pharmacist Encounter   Received phone call from Mr. Loureiro today to follow up regarding patient's oral chemotherapy medication: Braftovi (encorafenib) and Mektovi (binimetinib)  Patient wanted to review again how he will take the Braftovi capsules and Mektovi tablets in conjunction with his other medications.  We discussed in detail it is OK for patient to take Braftovi and Mektovi with his other medications, but if it is easier for him to take the Braftovi/Mektovi slightly spaced out from AM pills that is OK as well. Patient has opted to dose AM doses of Braftovi and Mektovi 30 minutes from his normal morning medications and vitamins.   Additionally confirmed with patient that he will space his PM dose of Mektovi 10-12 hours after his AM dose. Patient expressed understanding.   Patient wrote down the above plans and then also verbally read them back. Patient voiced good understanding of plan, but knows he can call the office with any questions or concerns. Patient expressed appreciation and understanding.   Sherry Ruffing, PharmD, BCPS, BCOP Hematology/Oncology Clinical Pharmacist [facility] Oral Chemotherapy Navigation Clinics [phone] 07/18/2023 1:49 PM

## 2023-07-18 NOTE — Progress Notes (Signed)
   Subjective:    Patient ID: Chad Moreno, male    DOB: Jan 07, 1941, 83 y.o.   MRN: [id number]  HPI 83 year old gentleman, former minimal tobacco history (5 pack years), hypertension, prostate cancer that has been treated by .  He has been followed in radiation oncology and underwent standard PET scan 08/16/2022 as below.  He is here today to discuss pulmonary nodular disease noted on his imaging. Well appearing. Does not have any respiratory complaints.   PET scan performed 08/16/2022 reviewed by me, shows right lower lobe and left upper lobe pulmonary nodules that were initially identified on a PSMA PET scan.  They were negative on the PSMA but are hypermetabolic on the FDG PET.  ROV 8/65/7846 --returns today for follow-up.  He is 24 with history of prostate cancer and hypertension, bilateral presumed stage I adenocarcinoma diagnosed by navigational bronchoscopy 08/2022 and treated with SBRT.  Subsequent imaging revealed evidence of recurrence with a moderate left pleural effusion, some increase in his left upper lobe nodular opacity, bilateral supraclavicular, subpectoral, left axillary and upper abdominal lymphadenopathy.  The pleural effusion was tapped and was malignant.  Following Chad Moreno and initiating in encorafenib and binimetinib soon.  There is been some discussion/consideration for possible Pleurx catheter if his pleural effusion continues to recur on therapy.  He is having exertional SOB, some pain in his R back and flank, intermittent.    Review of Systems As per HPI      Objective:   Physical Exam  Vitals:   07/18/23 0828 07/18/23 0830  BP: (!) 178/82 (!) 180/85  Pulse: 76   SpO2: 98%   Weight: 216 lb 6.4 oz (98.2 kg)   Height: 6' (1.829 m)    Gen: Pleasant, well-nourished, in no distress,  normal affect  ENT: No lesions,  mouth clear,  oropharynx clear, no postnasal drip  Neck: No JVD, hoarse voice  Lungs: No use of accessory muscles, no crackles or wheezing  on normal respiration, no wheeze on forced expiration  Cardiovascular: RRR, heart sounds normal, no murmur or gallops, no peripheral edema  Musculoskeletal: No deformities, no cyanosis or clubbing  Neuro: alert, awake, non focal  Skin: Warm, no lesions or rash     Assessment & Plan:  Non-small cell cancer of left lung [facility]) Patient has stage IV disease and is planning for targeted therapy with Chad Moreno.  He is also having XRT to his recurrent nerve for impact of lymphadenopathy on his vocal cord function causing hoarseness.  We did discuss his encorafenib and binimetinib, particularly side effects that would impact pulmonary function including pneumonitis or pleural effusion.  Pleural effusion Malignant left pleural effusion.  It has reaccumulated on his most recent chest x-ray.  I did talk to him about therapeutic thoracentesis.  He wanted to hold off for now but will let me or Chad Moreno know if he wants to do this.  We also discussed the potential role for Pleurx catheter should his pleural effusion continue to accumulate even after he gets on targeted therapy.    Chad Pupa, MD, PhD 07/18/2023, 9:12 AM [facility] [phone] or if no answer before 7:00PM call [phone] For any issues after 7:00PM please call eLink [phone]

## 2023-07-19 ENCOUNTER — Other Ambulatory Visit: Payer: Self-pay | Admitting: Radiation Oncology

## 2023-07-19 ENCOUNTER — Ambulatory Visit
Admission: RE | Admit: 2023-07-19 | Discharge: 2023-07-19 | Disposition: A | Discharge status: Home / Self Care | Payer: Medicare Other | Source: Ambulatory Visit | Attending: Radiation Oncology | Admitting: Radiation Oncology

## 2023-07-19 ENCOUNTER — Ambulatory Visit: Payer: Medicare Other

## 2023-07-19 ENCOUNTER — Other Ambulatory Visit: Payer: Self-pay

## 2023-07-19 ENCOUNTER — Ambulatory Visit
Admission: RE | Admit: 2023-07-19 | Discharge: 2023-07-19 | Disposition: A | Discharge status: Home / Self Care | Payer: Medicare Other | Source: Ambulatory Visit | Attending: Radiation Oncology

## 2023-07-19 ENCOUNTER — Other Ambulatory Visit (HOSPITAL_COMMUNITY): Payer: Self-pay

## 2023-07-19 ENCOUNTER — Ambulatory Visit (HOSPITAL_COMMUNITY)
Admission: RE | Admit: 2023-07-19 | Discharge: 2023-07-19 | Disposition: A | Discharge status: Home / Self Care | Payer: Medicare Other | Source: Ambulatory Visit | Attending: Internal Medicine | Admitting: Internal Medicine

## 2023-07-19 ENCOUNTER — Encounter: Payer: Self-pay | Admitting: Radiation Oncology

## 2023-07-19 DIAGNOSIS — C3492 Malignant neoplasm of unspecified part of left bronchus or lung: Secondary | ICD-10-CM | POA: Diagnosis not present

## 2023-07-19 DIAGNOSIS — C61 Malignant neoplasm of prostate: Secondary | ICD-10-CM | POA: Diagnosis not present

## 2023-07-19 DIAGNOSIS — C771 Secondary and unspecified malignant neoplasm of intrathoracic lymph nodes: Secondary | ICD-10-CM | POA: Diagnosis not present

## 2023-07-19 DIAGNOSIS — R490 Dysphonia: Secondary | ICD-10-CM

## 2023-07-19 DIAGNOSIS — I082 Rheumatic disorders of both aortic and tricuspid valves: Secondary | ICD-10-CM | POA: Diagnosis not present

## 2023-07-19 DIAGNOSIS — C3412 Malignant neoplasm of upper lobe, left bronchus or lung: Secondary | ICD-10-CM | POA: Diagnosis not present

## 2023-07-19 DIAGNOSIS — I110 Hypertensive heart disease with heart failure: Secondary | ICD-10-CM | POA: Diagnosis not present

## 2023-07-19 DIAGNOSIS — Z09 Encounter for follow-up examination after completed treatment for conditions other than malignant neoplasm: Secondary | ICD-10-CM | POA: Diagnosis not present

## 2023-07-19 DIAGNOSIS — I5032 Chronic diastolic (congestive) heart failure: Secondary | ICD-10-CM | POA: Diagnosis not present

## 2023-07-19 DIAGNOSIS — C3490 Malignant neoplasm of unspecified part of unspecified bronchus or lung: Secondary | ICD-10-CM | POA: Diagnosis not present

## 2023-07-19 DIAGNOSIS — Z510 Encounter for antineoplastic radiation therapy: Secondary | ICD-10-CM | POA: Diagnosis not present

## 2023-07-19 LAB — ECHOCARDIOGRAM LIMITED
AR max vel: 3.26 cm2
AV Area VTI: 3.01 cm2
AV Area mean vel: 2.73 cm2
AV Mean grad: 3 mm[Hg]
AV Peak grad: 5.7 mm[Hg]
Ao pk vel: 1.19 m/s
Area-P 1/2: 3.76 cm2
Calc EF: 51.3 %
Est EF: 55
S' Lateral: 3.6 cm
Single Plane A2C EF: 54.2 %
Single Plane A4C EF: 54.5 %

## 2023-07-19 LAB — RAD ONC ARIA SESSION SUMMARY
Course Elapsed Days: 9
Plan Fractions Treated to Date: 8
Plan Prescribed Dose Per Fraction: 4.5 Gy
Plan Total Fractions Prescribed: 8
Plan Total Prescribed Dose: 36 Gy
Reference Point Dosage Given to Date: 36 Gy
Reference Point Session Dosage Given: 4.5 Gy
Session Number: 8

## 2023-07-19 NOTE — Progress Notes (Signed)
  Radiation Oncology         [phone] ________________________________  Name: Chad Moreno MRN: [id number]  Date: 07/19/2023  DOB: 1940-09-30  Chart Note:  I reviewed this patient's most recent findings and wanted to take a minute to document my impression.  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  At this point, he has not experienced any improvement in hoarseness.  He is scheduled to start targeted therapy Monday for his BRAF-mutated stage IV lung cancer with BRAF inhibitors.  Hopefully, he'll tolerate the treatment and respond favorably.  I told him that his hoarseness may improve or resolve in the next 2 weeks post-radiation.  If that does not occur, he would like to see ENT for vocal cord injection to improve phonation.  I place a referral for this to Dr. Irene Pap.  He had previously met with Dr. Jenne Pane to discuss, but, [facility] was not in network for him.  He knows that his left pleural effusion has recurred on our radiation treatment imaging and will call us to set up thoracentesis if he becomes more dyspneic.   ________________________________  Artist Pais. Kathrynn Running, M.D.

## 2023-07-19 NOTE — Progress Notes (Signed)
*  PRELIMINARY RESULTS* Echocardiogram 2D Echocardiogram has been performed.  Chad Moreno 07/19/2023, 9:54 AM

## 2023-07-22 ENCOUNTER — Other Ambulatory Visit: Payer: Medicare Other

## 2023-07-22 ENCOUNTER — Telehealth (INDEPENDENT_AMBULATORY_CARE_PROVIDER_SITE_OTHER): Payer: Self-pay | Admitting: Otolaryngology

## 2023-07-22 ENCOUNTER — Ambulatory Visit: Payer: Medicare Other | Admitting: Internal Medicine

## 2023-07-22 ENCOUNTER — Ambulatory Visit: Payer: Medicare Other

## 2023-07-22 NOTE — Radiation Completion Notes (Addendum)
  Radiation Oncology         [phone] ________________________________  Name: Chad Moreno MRN: [id number]  Date: 07/19/2023  DOB: October 02, 1940  Date of Birth: 01-02-1941 Referring Physician: Yevonne Heman, M.D. Date of Service: 2023-07-22 Radiation Oncologist: Bartholome Ligas, M.D. [facility]     RADIATION ONCOLOGY END OF TREATMENT NOTE     Diagnosis:  83 y/o man with vocal cord paralysis secondary to recurrent lung cancer with disseminated regional adenopathy and malignant effusion.   Intent: Palliative     ==========DELIVERED PLANS==========  First Treatment Date: 2023-07-10 Last Treatment Date: 2023-07-19   Plan Name: [id number] Site: Lung, Left Technique: IMRT Mode: Photon Dose Per Fraction: 4.5 Gy Prescribed Dose (Delivered / Prescribed): 36 Gy / 36 Gy Prescribed Fxs (Delivered / Prescribed): 8 / 8     ==========ON TREATMENT VISIT DATES========== 2023-07-12, 2023-07-19   See weekly On Treatment Notes in Epic for details in the Media tab (listed as Progress notes on the On Treatment Visit Dates listed above).  He tolerated the treatments well with only modest fatigue.  The patient will receive a call in about one month from the radiation oncology department. He will continue follow up with his medical oncologist, Dr. Marguerita Shih, as well.  ------------------------------------------------   Chad Payer, MD [facility]  Radiation Oncology Direct Dial: [phone]  Fax: [phone] [email or web address]  Skype  [email or web address]

## 2023-07-22 NOTE — Telephone Encounter (Signed)
Called lvmail to try and sch pt on 07-22-23

## 2023-07-24 ENCOUNTER — Institutional Professional Consult (permissible substitution) (INDEPENDENT_AMBULATORY_CARE_PROVIDER_SITE_OTHER): Payer: Medicare Other | Admitting: Otolaryngology

## 2023-07-29 ENCOUNTER — Ambulatory Visit (HOSPITAL_COMMUNITY)
Admission: RE | Admit: 2023-07-29 | Discharge: 2023-07-29 | Disposition: A | Discharge status: Home / Self Care | Payer: Medicare Other | Source: Ambulatory Visit | Attending: Internal Medicine | Admitting: Internal Medicine

## 2023-07-29 DIAGNOSIS — C3490 Malignant neoplasm of unspecified part of unspecified bronchus or lung: Secondary | ICD-10-CM | POA: Diagnosis not present

## 2023-07-29 MED ORDER — GADOBUTROL 1 MMOL/ML IV SOLN
10.0000 mL | Freq: Once | INTRAVENOUS | Status: AC | PRN
  Administered 2023-07-29: 10 mL via INTRAVENOUS

## 2023-08-01 ENCOUNTER — Inpatient Hospital Stay (HOSPITAL_BASED_OUTPATIENT_CLINIC_OR_DEPARTMENT_OTHER): Payer: Medicare Other | Admitting: Internal Medicine

## 2023-08-01 ENCOUNTER — Inpatient Hospital Stay: Payer: Medicare Other | Attending: Internal Medicine

## 2023-08-01 VITALS — BP 184/93 | HR 69 | Temp 98.1°F | Resp 18 | Ht 72.0 in | Wt 219.6 lb

## 2023-08-01 DIAGNOSIS — C3431 Malignant neoplasm of lower lobe, right bronchus or lung: Secondary | ICD-10-CM | POA: Diagnosis not present

## 2023-08-01 DIAGNOSIS — J910 Malignant pleural effusion: Secondary | ICD-10-CM | POA: Diagnosis not present

## 2023-08-01 DIAGNOSIS — C3412 Malignant neoplasm of upper lobe, left bronchus or lung: Secondary | ICD-10-CM | POA: Diagnosis not present

## 2023-08-01 DIAGNOSIS — C778 Secondary and unspecified malignant neoplasm of lymph nodes of multiple regions: Secondary | ICD-10-CM | POA: Diagnosis not present

## 2023-08-01 DIAGNOSIS — C3492 Malignant neoplasm of unspecified part of left bronchus or lung: Secondary | ICD-10-CM

## 2023-08-01 LAB — CBC WITH DIFFERENTIAL (CANCER CENTER ONLY)
Abs Immature Granulocytes: 0.02 10*3/uL (ref 0.00–0.07)
Basophils Absolute: 0 10*3/uL (ref 0.0–0.1)
Basophils Relative: 1 %
Eosinophils Absolute: 0.1 10*3/uL (ref 0.0–0.5)
Eosinophils Relative: 2 %
HCT: 32.4 % — ABNORMAL LOW (ref 39.0–52.0)
Hemoglobin: 11.2 g/dL — ABNORMAL LOW (ref 13.0–17.0)
Immature Granulocytes: 1 %
Lymphocytes Relative: 16 %
Lymphs Abs: 0.6 10*3/uL — ABNORMAL LOW (ref 0.7–4.0)
MCH: 31.5 pg (ref 26.0–34.0)
MCHC: 34.6 g/dL (ref 30.0–36.0)
MCV: 91.3 fL (ref 80.0–100.0)
Monocytes Absolute: 0.4 10*3/uL (ref 0.1–1.0)
Monocytes Relative: 11 %
Neutro Abs: 2.8 10*3/uL (ref 1.7–7.7)
Neutrophils Relative %: 69 %
Platelet Count: 133 10*3/uL — ABNORMAL LOW (ref 150–400)
RBC: 3.55 MIL/uL — ABNORMAL LOW (ref 4.22–5.81)
RDW: 12.7 % (ref 11.5–15.5)
WBC Count: 4 10*3/uL (ref 4.0–10.5)
nRBC: 0 % (ref 0.0–0.2)

## 2023-08-01 LAB — CMP (CANCER CENTER ONLY)
ALT: 15 U/L (ref 0–44)
AST: 17 U/L (ref 15–41)
Albumin: 3.8 g/dL (ref 3.5–5.0)
Alkaline Phosphatase: 68 U/L (ref 38–126)
Anion gap: 6 (ref 5–15)
BUN: 23 mg/dL (ref 8–23)
CO2: 27 mmol/L (ref 22–32)
Calcium: 9.1 mg/dL (ref 8.9–10.3)
Chloride: 106 mmol/L (ref 98–111)
Creatinine: 1.03 mg/dL (ref 0.61–1.24)
GFR, Estimated: >60 mL/min (ref >60)
Glucose, Bld: 99 mg/dL (ref 70–99)
Potassium: 5 mmol/L (ref 3.5–5.1)
Sodium: 139 mmol/L (ref 135–145)
Total Bilirubin: 0.6 mg/dL (ref 0.0–1.2)
Total Protein: 6.4 g/dL — ABNORMAL LOW (ref 6.5–8.1)

## 2023-08-01 NOTE — Progress Notes (Signed)
 [facility] [phone]   [phone]  OFFICE PROGRESS NOTE  Chad Santos, MD [address] [place] [address]  DIAGNOSIS:  Recurrent/metastatic (T1c, N3, M1b) non-small cell lung cancer, adenocarcinoma initially diagnosed as early stage disease, stage I in March 2024 involving the right lower lobe and left upper lobe status post SBRT completed in May 2024.  The patient presented with recurrent and metastatic disease to bilateral supraclavicular, subpectoral, left axillary as well as upper abdominal lymphadenopathy and malignant left pleural effusion in December 2024 with confirmation of malignancy from the pleural effusion.   Biomarker Findings HRD signature - Cannot Be Determined Microsatellite status - Cannot Be Determined ? Tumor Mutational Burden - Cannot Be Determined Genomic Findings For a complete list of the genes assayed, please refer to the Appendix. BRAF V600E CHEK2 R474C CTNNB1 S45C DNMT3A W330* PPARG E3K 7 Disease relevant genes with no reportable alterations: ALK, EGFR, ERBB2, KRAS, MET, RET, ROS1  PDL1 TPS 1%  PRIOR THERAPY: None  CURRENT THERAPY:  Encorafenib  450 mg p.o. twice daily and binimetinib  45 mg p.o. twice daily.  First dose July 22, 2023.  INTERVAL HISTORY: Chad Moreno 83 y.o. male returns to the clinic today for follow-up visit accompanied by his wife.Discussed the use of AI scribe software for clinical note transcription with the patient, who gave verbal consent to proceed.  History of Present Illness   Chad Moreno is an 83 year old male with metastatic lung adenocarcinoma who presents for follow-up after starting targeted therapy. He is accompanied by his wife.  He has a history of metastatic lung adenocarcinoma with a BRAF V600E mutation, diagnosed in December 2024. He started targeted therapy on July 22, 2023, approximately ten days prior to this visit. Since starting the  treatment, he has experienced significant fatigue, shortness of breath, and a lack of energy, which he describes as 'zero energy level'. He is unsure if these symptoms are due to the new medication or the recent radiation therapy he received for his throat.  He recently completed radiation therapy to the lymph nodes in the middle of his chest and has been experiencing hoarseness of voice. His throat is not sore but has started to hurt, which he attributes to the radiation. He has been prescribed Carafate  to help with swallowing difficulties and is instructed to use it four times a day.  He denies experiencing nausea, vomiting, or diarrhea, but mentions feeling lightheaded. He also has fluid around his lungs, which has been causing shortness of breath. Previous chest x-rays showed that the fluid was not large enough to require drainage.  He is currently taking losartan  100 mg and amlodipine 2.5 mg for blood pressure management. His blood pressure fluctuates, with recent readings around 149 mmHg. He notes that his blood pressure tends to be higher during medical visits.       MEDICAL HISTORY: Past Medical History:  Diagnosis Date   Age-related cataract of left eye    Amblyopia, right eye    Arthritis    Cancer (HCC)    Dysuria    ED (erectile dysfunction)    Elevated PSA    Epidermal cyst    Family history of prostate cancer    Gout    Hip pain    Histoplasmosis    Hyperlipidemia    Hypermetropia, left eye    Hypertension    Impaired glucose tolerance    Joint pain    Left shoulder pain  Lower back pain    Melanocytic nevi, unspecified    Nummular dermatitis    Ocular hypertension    Palmar fascial fibromatosis    Prostate cancer screening    Regular astigmatism, right eye    Senile nuclear sclerosis     ALLERGIES:  is allergic to sulfa antibiotics.  MEDICATIONS:  Current Outpatient Medications  Medication Sig Dispense Refill   amLODipine (NORVASC) 2.5 MG tablet Take 2.5  mg by mouth in the morning.     Ascorbic Acid  (VITAMIN C  PO) Take 1 tablet by mouth daily with breakfast.     aspirin EC 81 MG tablet Take 81 mg by mouth in the morning.     B Complex Vitamins (VITAMIN-B COMPLEX) TABS Take 1 tablet by mouth daily with breakfast.     binimetinib  (MEKTOVI ) 15 MG tablet Take 3 tablets (45 mg total) by mouth 2 (two) times daily. 180 tablet 3   Cholecalciferol (VITAMIN D3) 50 MCG (2000 UT) TABS Take 2,000 Units by mouth daily with breakfast.     encorafenib  (BRAFTOVI ) 75 MG capsule Take 6 capsules (450 mg total) by mouth daily. 180 capsule 3   losartan  (COZAAR ) 100 MG tablet Take 50 mg by mouth 2 (two) times daily.      megestrol  (MEGACE ) 20 MG tablet Take 1 tablet (20 mg total) by mouth daily. 30 tablet 2   meloxicam (MOBIC) 15 MG tablet Take 15 mg by mouth in the morning.     naproxen sodium (ALEVE) 220 MG tablet Take 220 mg by mouth daily as needed (soreness with golfing).     Omega-3 Fatty Acids (FISH OIL PO) Take 1 capsule by mouth daily with breakfast.     oxyCODONE -acetaminophen  (PERCOCET/ROXICET) 5-325 MG tablet Take 1 tablet by mouth every 8 (eight) hours as needed for severe pain (pain score 7-10). 30 tablet 0   POTASSIUM PO Take 1 tablet by mouth daily with breakfast.     Probiotic Product (PROBIOTIC PEARLS ADVANTAGE PO) Take 1 capsule by mouth in the morning.     silodosin  (RAPAFLO ) 4 MG CAPS capsule Take 1 capsule (4 mg total) by mouth daily as needed (urinary retention.).     sucralfate  (CARAFATE ) 1 g tablet Take 1 tablet (1 g total) by mouth 4 (four) times daily. Dissolve each tablet in 15 cc water before use. 120 tablet 1   No current facility-administered medications for this visit.    SURGICAL HISTORY:  Past Surgical History:  Procedure Laterality Date   BRONCHIAL BIOPSY  08/27/2022   Procedure: BRONCHIAL BIOPSIES;  Surgeon: Shelah Lamar RAMAN, MD;  Location: [facility] ENDOSCOPY;  Service: Pulmonary;;   BRONCHIAL BRUSHINGS  08/27/2022   Procedure: BRONCHIAL  BRUSHINGS;  Surgeon: Shelah Lamar RAMAN, MD;  Location[facility] ENDOSCOPY;  Service: Pulmonary;;   BRONCHIAL NEEDLE ASPIRATION BIOPSY  08/27/2022   Procedure: BRONCHIAL NEEDLE ASPIRATION BIOPSIES;  Surgeon: Shelah Lamar RAMAN, MD;  Location: [facility] ENDOSCOPY;  Service: Pulmonary;;   CONSTRICTING FINGER RING EXCISION Left    CYST REMOVAL TRUNK Left    left posterior shoulder   FIDUCIAL MARKER PLACEMENT  08/27/2022   Procedure: FIDUCIAL MARKER PLACEMENT;  Surgeon: Shelah Lamar RAMAN, MD;  Location: [facility] ENDOSCOPY;  Service: Pulmonary;;   GOLD SEED IMPLANT N/A 10/02/2022   Procedure: GOLD SEED IMPLANT;  Surgeon: Lovie Arlyss CROME, MD;  Location: [facility];  Service: Urology;  Laterality: N/A;   index finger Left    IR THORACENTESIS ASP PLEURAL SPACE W/IMG GUIDE  06/14/2023   Prostate  needle biopsy     SPACE OAR INSTILLATION N/A 10/02/2022   Procedure: SPACE OAR INSTILLATION;  Surgeon: Lovie Arlyss CROME, MD;  Location: [facility];  Service: Urology;  Laterality: N/A;    REVIEW OF SYSTEMS:  Constitutional: positive for fatigue Eyes: negative Ears, nose, mouth, throat, and face: positive for hoarseness Respiratory: positive for cough and dyspnea on exertion Cardiovascular: negative Gastrointestinal: positive for dysphagia and odynophagia Genitourinary:negative Integument/breast: negative Hematologic/lymphatic: negative Musculoskeletal:negative Neurological: negative Behavioral/Psych: negative Endocrine: negative Allergic/Immunologic: negative   PHYSICAL EXAMINATION: General appearance: alert, cooperative, fatigued, and no distress Head: Normocephalic, without obvious abnormality, atraumatic Neck: no adenopathy, no JVD, supple, symmetrical, trachea midline, and thyroid  not enlarged, symmetric, no tenderness/mass/nodules Lymph nodes: Cervical, supraclavicular, and axillary nodes normal. Resp: diminished breath sounds LLL and dullness to percussion LLL Back: symmetric, no curvature.  ROM normal. No CVA tenderness. Cardio: regular rate and rhythm, S1, S2 normal, no murmur, click, rub or gallop GI: soft, non-tender; bowel sounds normal; no masses,  no organomegaly Extremities: extremities normal, atraumatic, no cyanosis or edema Neurologic: Alert and oriented X 3, normal strength and tone. Normal symmetric reflexes. Normal coordination and gait  ECOG PERFORMANCE STATUS: 1 - Symptomatic but completely ambulatory  Blood pressure (!) 184/93, pulse 69, temperature 98.1 F (36.7 C), temperature source Oral, resp. rate 18, height 6' (1.829 m), weight 219 lb 9.6 oz (99.6 kg), SpO2 99%.  LABORATORY DATA: Lab Results  Component Value Date   WBC 4.0 08/01/2023   HGB 11.2 (L) 08/01/2023   HCT 32.4 (L) 08/01/2023   MCV 91.3 08/01/2023   PLT 133 (L) 08/01/2023      Chemistry      Component Value Date/Time   NA 140 07/15/2023 1330   K 4.3 07/15/2023 1330   CL 107 07/15/2023 1330   CO2 27 07/15/2023 1330   BUN 21 07/15/2023 1330   CREATININE 1.14 07/15/2023 1330      Component Value Date/Time   CALCIUM 9.2 07/15/2023 1330   ALKPHOS 82 07/15/2023 1330   AST 18 07/15/2023 1330   ALT 13 07/15/2023 1330   BILITOT 0.6 07/15/2023 1330       RADIOGRAPHIC STUDIES: MR BRAIN W WO CONTRAST Result Date: 07/31/2023 CLINICAL DATA:  Non-small cell lung cancer, staging EXAM: MRI HEAD WITHOUT AND WITH CONTRAST TECHNIQUE: Multiplanar, multiecho pulse sequences of the brain and surrounding structures were obtained without and with intravenous contrast. CONTRAST:  10mL GADAVIST  GADOBUTROL  1 MMOL/ML IV SOLN COMPARISON:  03/12/2019 MRI FINDINGS: Brain: No restricted diffusion to suggest acute or subacute infarct. No abnormal parenchymal or meningeal enhancement. No acute hemorrhage, mass, mass effect, or midline shift. No hydrocephalus or extra-axial collection. Pituitary and craniocervical junction within normal limits. No hemosiderin deposition to suggest remote hemorrhage. Normal cerebral  volume for age. Scattered T2 hyperintense signal in the periventricular white matter, likely the sequela of mild chronic small vessel ischemic disease. Vascular: Normal arterial flow voids. Normal arterial and venous enhancement. Skull and upper cervical spine: Normal marrow signal. Sinuses/Orbits: Clear paranasal sinuses. No acute finding in the orbits. Other: The mastoid air cells are well aerated. IMPRESSION: No acute intracranial process. No evidence of intracranial metastatic disease. Electronically Signed   By: Donald Campion M.D.   On: 07/31/2023 16:31   ECHOCARDIOGRAM LIMITED Result Date: 07/19/2023    ECHOCARDIOGRAM LIMITED REPORT   Patient Name:   Chad Moreno Date of Exam: 07/19/2023 Medical Rec #:  [id number]       Height:  72.0 in Accession #:    [id number]      Weight:       216.4 lb Date of Birth:  11-11-1940       BSA:          2.203 m Patient Age:    82 years        BP:           180/85 mmHg Patient Gender: M               HR:           66 bpm. Exam Location:  Outpatient Procedure: Cardiac Doppler, Color Doppler and Limited Echo Indications:    Chemo Z09  History:        Patient has no prior history of Echocardiogram examinations.                 Risk Factors:Hypertension.  Sonographer:    Bari Roar Referring Phys: [id number] Bobby Ragan IMPRESSIONS  1. Left ventricular ejection fraction, by estimation, is 55%. The left ventricle has normal function. The left ventricle has no regional wall motion abnormalities. Left ventricular diastolic parameters are consistent with Grade I diastolic dysfunction (impaired relaxation).  2. Right ventricular systolic function is normal. The right ventricular size is normal. Tricuspid regurgitation signal is inadequate for assessing PA pressure.  3. The aortic valve is tricuspid. There is mild calcification of the aortic valve. Aortic valve regurgitation is trivial. No aortic stenosis is present.  4. The inferior vena cava is normal in size with greater than  50% respiratory variability, suggesting right atrial pressure of 3 mmHg.  5. IVC not visualized.  6. Limited echo. FINDINGS  Left Ventricle: Left ventricular ejection fraction, by estimation, is 55%. The left ventricle has normal function. The left ventricle has no regional wall motion abnormalities. The left ventricular internal cavity size was normal in size. There is no left ventricular hypertrophy. Left ventricular diastolic parameters are consistent with Grade I diastolic dysfunction (impaired relaxation). Right Ventricle: The right ventricular size is normal. No increase in right ventricular wall thickness. Right ventricular systolic function is normal. Tricuspid regurgitation signal is inadequate for assessing PA pressure. Left Atrium: Left atrial size was normal in size. Right Atrium: Right atrial size was normal in size. Pericardium: Trivial pericardial effusion is present. Tricuspid Valve: The tricuspid valve is normal in structure. Tricuspid valve regurgitation is not demonstrated. Aortic Valve: The aortic valve is tricuspid. There is mild calcification of the aortic valve. Aortic valve regurgitation is trivial. No aortic stenosis is present. Aortic valve mean gradient measures 3.0 mmHg. Aortic valve peak gradient measures 5.7 mmHg. Aortic valve area, by VTI measures 3.01 cm. Pulmonic Valve: The pulmonic valve was normal in structure. Pulmonic valve regurgitation is not visualized. Aorta: The aortic root is normal in size and structure. Venous: The inferior vena cava is normal in size with greater than 50% respiratory variability, suggesting right atrial pressure of 3 mmHg. IAS/Shunts: No atrial level shunt detected by color flow Doppler. Additional Comments: Color Doppler performed.  LEFT VENTRICLE PLAX 2D LVIDd:         4.80 cm     Diastology LVIDs:         3.60 cm     LV e' medial:    5.98 cm/s LV PW:         1.00 cm     LV E/e' medial:  10.0 LV IVS:        1.20 cm  LV e' lateral:   6.20 cm/s LVOT  diam:     2.10 cm     LV E/e' lateral: 9.6 LV SV:         76 LV SV Index:   34 LVOT Area:     3.46 cm  LV Volumes (MOD) LV vol d, MOD A2C: 78.9 ml LV vol d, MOD A4C: 67.3 ml LV vol s, MOD A2C: 36.1 ml LV vol s, MOD A4C: 30.6 ml LV SV MOD A2C:     42.8 ml LV SV MOD A4C:     67.3 ml LV SV MOD BP:      38.8 ml RIGHT VENTRICLE RV Basal diam:  2.50 cm RV Mid diam:    2.30 cm RV S prime:     12.30 cm/s TAPSE (M-mode): 2.7 cm LEFT ATRIUM           Index LA diam:      3.50 cm 1.59 cm/m LA Vol (A4C): 71.2 ml 32.32 ml/m  AORTIC VALVE                    PULMONIC VALVE AV Area (Vmax):    3.26 cm     PV Vmax:        1.00 m/s AV Area (Vmean):   2.73 cm     PV Peak grad:   4.0 mmHg AV Area (VTI):     3.01 cm     RVOT Peak grad: 2 mmHg AV Vmax:           119.00 cm/s AV Vmean:          86.400 cm/s AV VTI:            0.252 m AV Peak Grad:      5.7 mmHg AV Mean Grad:      3.0 mmHg LVOT Vmax:         112.00 cm/s LVOT Vmean:        68.000 cm/s LVOT VTI:          0.219 m LVOT/AV VTI ratio: 0.87  AORTA Ao Sinus diam: 3.60 cm Ao STJ diam:   3.1 cm MITRAL VALVE MV Area (PHT): 3.76 cm    SHUNTS MV Decel Time: 202 msec    Systemic VTI:  0.22 m MV E velocity: 59.70 cm/s  Systemic Diam: 2.10 cm MV A velocity: 99.00 cm/s MV E/A ratio:  0.60 Dalton McleanMD Electronically signed by Ezra Kanner Signature Date/Time: 07/19/2023/10:31:31 AM    Final    DG Chest 1 View Result Date: 07/15/2023 CLINICAL DATA:  Reassess left pleural effusion accumulation EXAM: CHEST  1 VIEW COMPARISON:  Radiographs 06/14/2023 and 10/17/2022. CT 06/06/2023. PET-CT 06/18/2023. FINDINGS: 1530 hours. The heart size and mediastinal contours are stable with aortic atherosclerosis. A small left pleural effusion has reaccumulated in the interval. No significant pleural fluid on the right or pneumothorax. Bilateral pulmonary fiducial/localization markers with stable adjacent parenchymal opacities. The bones appear unchanged. IMPRESSION: Interval reaccumulation of a  small left pleural effusion. No pneumothorax. Electronically Signed   By: Elsie Perone M.D.   On: 07/15/2023 16:13    ASSESSMENT AND PLAN: This is a very pleasant 83 years old white male with Recurrent/metastatic (T1c, N3, M1b) non-small cell lung cancer, adenocarcinoma initially diagnosed as early stage disease, stage I in March 2024 involving the right lower lobe and left upper lobe status post SBRT completed in May 2024.  The patient presented with recurrent and metastatic disease to bilateral supraclavicular,  subpectoral, left axillary as well as upper abdominal lymphadenopathy and malignant left pleural effusion in December 2024 with confirmation of malignancy from the pleural effusion. Molecular studies by foundation 1 showed positive BRAF V600E mutation and PD-L1 expression of 1%. The patient is currently on treatment with encorafenib  450 mg p.o. twice daily in addition to binimetinib  45 mg milligram p.o. twice daily.  First dose was July 22, 2023. He is tolerating the first 2 weeks of his treatment fairly well except for the generalized fatigue and weakness.    Metastatic Lung Adenocarcinoma with BRAF V600E Mutation Diagnosed in December 2024, started targeted therapy on July 22, 2023. Reports significant fatigue, shortness of breath, and low energy levels, likely due to cancer treatment and recent chest radiation. Throat pain attributed to radiation. No nausea, vomiting, or diarrhea. Recent MRI showed no intracranial metastatic disease. Side effects may improve in about a month. Emphasized importance of monitoring and reporting new symptoms or side effects. - Continue targeted therapy - Use Carafate  QID for throat pain - Monitor for side effects and treatment efficacy - Follow up with ENT for hoarseness - Monitor pleural effusion, consider chest x-ray if symptoms worsen - Recheck labs in two weeks  Radiation-Induced Esophagitis Throat pain localized to radiation-treated area,  consistent with radiation-induced esophagitis causing painful swallowing. Prescribed Carafate  for symptom management. - Use Carafate  QID - Follow up with radiation oncologist Dr. Patrcia  Hypertension Currently on losartan  100 mg and amlodipine 2.5 mg. Blood pressure elevated during visit, possibly due to anxiety. Reports fluctuating home readings. - Recheck blood pressure - Continue current antihypertensive medications - Monitor blood pressure at home and report significant changes  General Health Maintenance Discussed routine health maintenance, importance of monitoring for medication side effects, and regular follow-up appointments. - Schedule follow-up appointment in two weeks - Monitor for new symptoms or side effects and report immediately.   The patient was advised to call immediately if he has any other concerning symptoms in the interval. The patient voices understanding of current disease status and treatment options and is in agreement with the current care plan.  All questions were answered. The patient knows to call the clinic with any problems, questions or concerns. We can certainly see the patient much sooner if necessary.  The total time spent in the appointment was 30 minutes.  Disclaimer: This note was dictated with voice recognition software. Similar sounding words can inadvertently be transcribed and may not be corrected upon review.

## 2023-08-05 ENCOUNTER — Encounter (INDEPENDENT_AMBULATORY_CARE_PROVIDER_SITE_OTHER): Payer: Self-pay | Admitting: Otolaryngology

## 2023-08-05 ENCOUNTER — Ambulatory Visit (INDEPENDENT_AMBULATORY_CARE_PROVIDER_SITE_OTHER): Payer: Medicare Other | Admitting: Otolaryngology

## 2023-08-05 VITALS — BP 182/76 | HR 88

## 2023-08-05 DIAGNOSIS — K209 Esophagitis, unspecified without bleeding: Secondary | ICD-10-CM | POA: Diagnosis not present

## 2023-08-05 DIAGNOSIS — J383 Other diseases of vocal cords: Secondary | ICD-10-CM

## 2023-08-05 DIAGNOSIS — K219 Gastro-esophageal reflux disease without esophagitis: Secondary | ICD-10-CM

## 2023-08-05 DIAGNOSIS — R490 Dysphonia: Secondary | ICD-10-CM | POA: Diagnosis not present

## 2023-08-05 DIAGNOSIS — R070 Pain in throat: Secondary | ICD-10-CM | POA: Diagnosis not present

## 2023-08-05 DIAGNOSIS — J3801 Paralysis of vocal cords and larynx, unilateral: Secondary | ICD-10-CM | POA: Diagnosis not present

## 2023-08-05 MED ORDER — ALPRAZOLAM 0.5 MG PO TABS: 0.5000 mg | ORAL_TABLET | Freq: Once | ORAL | 0 refills | Status: AC

## 2023-08-05 NOTE — Progress Notes (Signed)
ENT CONSULT:  Reason for Consult: left vocal fold paralysis and dysphonia   HPI: Discussed the use of AI scribe software for clinical note transcription with the patient, who gave verbal consent to proceed.  History of Present Illness   Chad Moreno "Chad Moreno" is an 83 year old male with lung cancer and left vocal cord paralysis who presents with persistent dysphonia. He was referred by his oncologist for evaluation of vocal cord paralysis.  He has a history of left vocal cord paralysis, diagnosed approximately four to five months ago after a CT scan of the chest and neck. The paralysis is attributed to a lymph node pressing against recurrent laryngeal nerve. His voice is described as raspy and breathy, with a need to gasp for air and running out of air when talking.   He presents with recent onset of discomfort  when swallowing, noticed over the past week to week and a half. The discomfort is intermittent, with no associated difficulty with swallowing. No coughing or choking occurs when drinking liquids or eating food, and he maintains a regular diet.  His lung cancer treatment included radiation and chemo, with the most recent radiation completed two weeks ago (had two rounds of XRT). He takes twelve pills daily, nine in the morning and three at night. No IV chemotherapy is currently administered.  He has prostate cancer, treated with 28 radiation sessions completed on June 6th. He receives hormone shots every six months, with the next one scheduled for February 26th.  A brain MRI was recently performed as part of his cancer workup. A neck scan in November coincided with the onset of voice changes. He also had CT chest after he was diagnosed with VF paralysis. No history of stroke is reported.     Records Reviewed:  Dr Arbutus Ped office visit 08/01/23   Recurrent/metastatic (T1c, N3, M1b) non-small cell lung cancer, adenocarcinoma initially diagnosed as early stage disease, stage I in March 2024  involving the right lower lobe and left upper lobe status post SBRT completed in May 2024.  The patient presented with recurrent and metastatic disease to bilateral supraclavicular, subpectoral, left axillary as well as upper abdominal lymphadenopathy and malignant left pleural effusion in December 2024 with confirmation of malignancy from the pleural effusion.      He is tolerating the first 2 weeks of his treatment fairly well except for the generalized fatigue and weakness.    Metastatic Lung Adenocarcinoma with BRAF V600E Mutation Diagnosed in December 2024, started targeted therapy on July 22, 2023. Reports significant fatigue, shortness of breath, and low energy levels, likely due to cancer treatment and recent chest radiation. Throat pain attributed to radiation. No nausea, vomiting, or diarrhea. Recent MRI showed no intracranial metastatic disease. Side effects may improve in about a month. Emphasized importance of monitoring and reporting new symptoms or side effects. - Continue targeted therapy - Use Carafate QID for throat pain - Monitor for side effects and treatment efficacy - Follow up with ENT for hoarseness - Monitor pleural effusion, consider chest x-ray if symptoms worsen - Recheck labs in two weeks   Radiation-Induced Esophagitis Throat pain localized to radiation-treated area, consistent with radiation-induced esophagitis causing painful swallowing. Prescribed Carafate for symptom management. - Use Carafate QID - Follow up with radiation oncologist Dr. Kathrynn Running   Past Medical History:  Diagnosis Date   Age-related cataract of left eye    Amblyopia, right eye    Arthritis    Cancer (HCC)    Dysuria  ED (erectile dysfunction)    Elevated PSA    Epidermal cyst    Family history of prostate cancer    Gout    Hip pain    Histoplasmosis    Hyperlipidemia    Hypermetropia, left eye    Hypertension    Impaired glucose tolerance    Joint pain    Left shoulder pain     Lower back pain    Melanocytic nevi, unspecified    Nummular dermatitis    Ocular hypertension    Palmar fascial fibromatosis    Prostate cancer screening    Regular astigmatism, right eye    Senile nuclear sclerosis     Past Surgical History:  Procedure Laterality Date   BRONCHIAL BIOPSY  08/27/2022   Procedure: BRONCHIAL BIOPSIES;  Surgeon: Leslye Peer, MD;  Location: [facility];  Service: Pulmonary;;   BRONCHIAL BRUSHINGS  08/27/2022   Procedure: BRONCHIAL BRUSHINGS;  Surgeon: Leslye Peer, MD;  Location: [facility];  Service: Pulmonary;;   BRONCHIAL NEEDLE ASPIRATION BIOPSY  08/27/2022   Procedure: BRONCHIAL NEEDLE ASPIRATION BIOPSIES;  Surgeon: Leslye Peer, MD;  Location: [facility];  Service: Pulmonary;;   CONSTRICTING FINGER RING EXCISION Left    CYST REMOVAL TRUNK Left    left posterior shoulder   FIDUCIAL MARKER PLACEMENT  08/27/2022   Procedure: FIDUCIAL MARKER PLACEMENT;  Surgeon: Leslye Peer, MD;  Location: [facility];  Service: Pulmonary;;   GOLD SEED IMPLANT N/A 10/02/2022   Procedure: GOLD SEED IMPLANT;  Surgeon: Despina Arias, MD;  Location: [facility];  Service: Urology;  Laterality: N/A;   index finger Left    IR THORACENTESIS ASP PLEURAL SPACE W/IMG GUIDE  06/14/2023   Prostate needle biopsy     SPACE OAR INSTILLATION N/A 10/02/2022   Procedure: SPACE OAR INSTILLATION;  Surgeon: Despina Arias, MD;  Location: [facility];  Service: Urology;  Laterality: N/A;    Family History  Problem Relation Age of Onset   Prostate cancer Father     Social History:  reports that he quit smoking about 44 years ago. His smoking use included cigarettes. He started smoking about 54 years ago. He has a 5 pack-year smoking history. He has never used smokeless tobacco. He reports current alcohol use. He reports that he does not use drugs.  Allergies:  Allergies  Allergen Reactions   Sulfa Antibiotics Anaphylaxis     Medications: I have reviewed the patient's current medications.  The PMH, PSH, Medications, Allergies, and SH were reviewed and updated.  ROS: Constitutional: Negative for fever, weight loss and weight gain. Cardiovascular: Negative for chest pain and dyspnea on exertion. Respiratory: Is not experiencing shortness of breath at rest. Gastrointestinal: Negative for nausea and vomiting. Neurological: Negative for headaches. Psychiatric: The patient is not nervous/anxious  Blood pressure (!) 182/76, pulse 88, SpO2 96%.  PHYSICAL EXAM:  Exam: General: Well-developed, well-nourished Communication and Voice: raspy breathy Respiratory Respiratory effort: Equal inspiration and expiration without stridor Cardiovascular Peripheral Vascular: Warm extremities with equal color/perfusion Eyes: No nystagmus with equal extraocular motion bilaterally Neuro/Psych/Balance: Patient oriented to person, place, and time; Appropriate mood and affect; Gait is intact with no imbalance; Cranial nerves I-XII are intact Head and Face Inspection: Normocephalic and atraumatic without mass or lesion Palpation: Facial skeleton intact without bony stepoffs Salivary Glands: No mass or tenderness Facial Strength: Facial motility symmetric and full bilaterally ENT Pinna: External ear intact and fully developed External canal: Canal is patent with intact  skin Tympanic Membrane: Clear and mobile External Nose: No scar or anatomic deformity Internal Nose: Septum is deviated to the left. No polyp, or purulence. Mucosal edema and erythema present.  Bilateral inferior turbinate hypertrophy.  Lips, Teeth, and gums: Mucosa and teeth intact and viable TMJ: No pain to palpation with full mobility Oral cavity/oropharynx: No erythema or exudate, no lesions present Nasopharynx: No mass or lesion with intact mucosa Hypopharynx: Intact mucosa without pooling of secretions Larynx Glottic: B/l VF without lesion or mass, R  VF paralysis, b/l VF atrophy and glottic insufficiency Supraglottic: Normal appearing epiglottis and AE folds Interarytenoid Space: Moderate pachydermia&edema Subglottic Space: Patent without lesion or edema Neck Neck and Trachea: Midline trachea without mass or lesion Thyroid: No mass or nodularity Lymphatics: No lymphadenopathy  Procedure: Preoperative diagnosis: dysphonia  Postoperative diagnosis:   Same + L VF paralysis   Procedure: Flexible fiberoptic laryngoscopy  Surgeon: Ashok Croon, MD  Anesthesia: Topical lidocaine and Afrin Complications: None Condition is stable throughout exam  Indications and consent:  The patient presents to the clinic with Indirect laryngoscopy view was incomplete. Thus it was recommended that they undergo a flexible fiberoptic laryngoscopy. All of the risks, benefits, and potential complications were reviewed with the patient preoperatively and verbal informed consent was obtained.  Procedure: The patient was seated upright in the clinic. Topical lidocaine and Afrin were applied to the nasal cavity. After adequate anesthesia had occurred, I then proceeded to pass the flexible telescope into the nasal cavity. The nasal cavity was patent without rhinorrhea or polyp. The nasopharynx was also patent without mass or lesion. The base of tongue was visualized and was normal. There were no signs of pooling of secretions in the piriform sinuses. The true vocal folds had evidence of  L VF paralysis. There were no signs of glottic or supraglottic mucosal lesion or mass. There was moderate interarytenoid pachydermia and post cricoid edema. The telescope was then slowly withdrawn and the patient tolerated the procedure throughout.  Studies Reviewed: MRI brain 07/29/23 IMPRESSION: No acute intracranial process. No evidence of intracranial metastatic disease.  06/18/23 PET/CT.   IMPRESSION: 1. Clear progression nodal metastasis with new hypermetabolic  LEFT axillary nodes, LEFT sub pectoralis lymph nodes and bilateral hypermetabolic supraclavicular lymph nodes. 2. Moderate metabolic activity associated with the treatment sites in LEFT upper lobe and RIGHT lower lobe with masslike densities. Indeterminate findings for post therapy change versus residual carcinoma. 3. New small nodal metastasis in the upper abdomen adjacent to the SMA and celiac trunk. 4. Decreased metabolic activity of sclerotic skeletal lesions in pelvis.  CT neck 05/10/23 CLINICAL DATA:  Left vocal cord paresis. Hoarseness. Symptoms of 10 weeks duration. History of prostate and lung cancer.   EXAM: CT NECK WITH CONTRAST   TECHNIQUE: Multidetector CT imaging of the neck was performed using the standard protocol following the bolus administration of intravenous contrast.   RADIATION DOSE REDUCTION: This exam was performed according to the departmental dose-optimization program which includes automated exposure control, adjustment of the mA and/or kV according to patient size and/or use of iterative reconstruction technique.   CONTRAST:  75mL ISOVUE-300 IOPAMIDOL (ISOVUE-300) INJECTION 61%   COMPARISON:  Chest CT 04/02/2023   FINDINGS: Pharynx and larynx: No evidence of mucosal or submucosal mass lesion or inflammatory change. Patient does show findings of vocal fold paresis or paralysis on the left. Vocal fold is closer to midline. Laryngeal ventricle is dilated on the left.   Salivary glands: Parotid and submandibular glands are normal.  Thyroid: Normal   Lymph nodes: On the right, there is an enlarged level 4/level 5 node measuring 15 x 11 mm axial image 100. Indistinct tissue in the right supraclavicular fossa in the region of axial image 106 is also worrisome for metastatic disease. I think these findings are progressive since October.   On the left, there are no enlarged cervical chain nodes. There are small but enlarging supraclavicular lymph  nodes on the left worrisome for early evidence of metastatic disease. I do not see a lesion along the course of the recurrent laryngeal nerve on the left. Further enlargement of previously noted abnormal left axillary lymph nodes.   Vascular: Atherosclerotic calcification of the carotid bifurcations but no likely flow limiting stenosis. Detailed exam not performed.   Limited intracranial: No abnormality seen.   Visualized orbits: Normal   Mastoids and visualized paranasal sinuses: Clear   Skeleton: Ordinary cervical spondylosis.   Upper chest: Enlarging effusion on the left layering dependently with dependent atelectasis. Post treatment densities in the left upper lobe and right midlung remain visible and are not primarily or completely evaluated.   Other: None   IMPRESSION: 1. No evidence of mucosal or submucosal mass lesion or inflammatory change in the neck. Patient does show findings of vocal fold paresis or paralysis on the left. 2. Enlarging level 4/level 5 node on the right measuring 15 x 11 mm. Indistinct tissue in the right supraclavicular fossa in the region of axial worrisome for metastatic disease. I think these findings are progressive since October. 3. Small but enlarging supraclavicular lymph nodes on the left worrisome for early evidence of metastatic disease in that region. 4. Further enlargement of previously noted abnormal left axillary lymph nodes. 5. Enlarging effusion on the left layering dependently with dependent atelectasis. Post treatment densities in the left upper lobe and right midlung remain visible and are not primarily or completely evaluated.  CT chest 04/02/23 IMPRESSION: Status post SBRT with radiation changes in the left upper lobe and superior segment right lower lobe.   Mildly progressive left axillary node, suspicious. Isolated nodal metastasis/recurrence is possible. Attention on follow-up is suggested.   Moderate left pleural  effusion, progressive.   Aortic Atherosclerosis (ICD10-I70.0).     Assessment/Plan: Encounter Diagnoses  Name Primary?   Dysphonia Yes   Paralysis of left vocal fold    Vocal fold atrophy    Glottic insufficiency    Chronic GERD    Esophagitis    Throat discomfort     Assessment and Plan    Chronic dysphonia and Left Vocal Cord Paralysis x 4-5 months  Left vocal cord paralysis could be 2/2 recurrent laryngeal nerve compression by a lymph node/nodal mets. Examination confirmed left vocal cord paralysis and age-related VF atrophy/glottic insufficiency. Slightly asymmetric epiglottis noted, potentially complicating glottic exposure/view. Discussed in-office VF injection augmentation with local anesthesia, which would last 6-12 months, possibly requiring 2 rounds of filler injection. Informed that 5% of patients may need general anesthesia, 2/2 poor procedure tolerance. Patient prefers general anesthesia but agreed to attempt local anesthesia first 2/2 having a hard time recovering from general anesthesia in the past.  - Schedule b/l VF injection augmentation for early March  - Prescribed Xanax for pre-procedure anxiety - Provide procedural instructions and information - to review at home  Throat discomfort with swallowing - per Oncology notes, likely radiation-induced esophagitis, since sx localize to radiation-treated area - continue Carafate and see Dr Kathrynn Running, Rad Onc as scheduled  - will consider EGD in the  future if sx will not improve   Chronic GERD LPR - trial of Reflux Gourmet - diet and lifestyle changes to minimize GERD  Metastatic Lung Cancer - Continue current therapy and f/u with Oncology  Prostate Cancer Prostate cancer treated with 28 radiation sessions, completed June 6th. Receives hormone shots biannually, next scheduled for February 26th. - Continue hormone therapy as scheduled - Follow up with urologist on February 24th  Follow-up - Book injection  augmentation for early March - will need pre-auth       Thank you for allowing me to participate in the care of this patient. Please do not hesitate to contact me with any questions or concerns.   Ashok Croon, MD Otolaryngology [facility] Phone: [phone] Fax: [phone]    08/06/2023, 3:40 PM

## 2023-08-05 NOTE — Patient Instructions (Signed)

## 2023-08-08 ENCOUNTER — Other Ambulatory Visit: Payer: Self-pay

## 2023-08-08 ENCOUNTER — Other Ambulatory Visit (HOSPITAL_COMMUNITY): Payer: Self-pay

## 2023-08-08 NOTE — Progress Notes (Signed)
Specialty Pharmacy Ongoing Clinical Assessment Note  Chad Moreno is a 83 y.o. male who is being followed by the specialty pharmacy service for RxSp Oncology   Patient's specialty medication(s) reviewed today: Binimetinib (MEKTOVI); Encorafenib (BRAFTOVI)   Missed doses in the last 4 weeks: 0   Patient/Caregiver did not have any additional questions or concerns.   Therapeutic benefit summary: Patient is achieving benefit   Adverse events/side effects summary: Experienced adverse events/side effects (fatigue and shortness of breath, both are tolerable at this time)   Patient's therapy is appropriate to: Continue    Goals Addressed             This Visit's Progress    Slow Disease Progression   No change    Patient is initiating therapy. Patient will maintain adherence.         Follow up:  3 months  Servando Snare Specialty Pharmacist

## 2023-08-08 NOTE — Progress Notes (Signed)
Specialty Pharmacy Refill Coordination Note  Chad Moreno is a 83 y.o. male contacted today regarding refills of specialty medication(s) Binimetinib (MEKTOVI); Encorafenib (BRAFTOVI)   Patient requested Delivery   Delivery date: 08/13/23   Verified address: [address]   [place] [id number]   Medication will be filled on 08/12/23.

## 2023-08-09 ENCOUNTER — Telehealth: Payer: Self-pay | Admitting: Internal Medicine

## 2023-08-09 NOTE — Progress Notes (Signed)
[facility] OFFICE PROGRESS NOTE  [name], MD [address] [place] [address]  DIAGNOSIS: Recurrent/metastatic (T1c, N3, M1b) non-small cell lung cancer, adenocarcinoma initially diagnosed as early stage disease, stage I in March 2024 involving the right lower lobe and left upper lobe status post SBRT completed in May 2024.  The patient presented with recurrent and metastatic disease to bilateral supraclavicular, subpectoral, left axillary as well as upper abdominal lymphadenopathy and malignant left pleural effusion in December 2024 with confirmation of malignancy from the pleural effusion.    Biomarker Findings HRD signature - Cannot Be Determined Microsatellite status - Cannot Be Determined ? Tumor Mutational Burden - Cannot Be Determined Genomic Findings For a complete list of the genes assayed, please refer to the Appendix. BRAF V600E CHEK2 R474C CTNNB1 S45C DNMT3A W330* PPARG E3K 7 Disease relevant genes with no reportable alterations: ALK, EGFR, ERBB2, KRAS, MET, RET, ROS1   PDL1 TPS 1%  PRIOR THERAPY: UHRT to the AP window lymphadenopathy under the care of Dr. Kathrynn Running, most recent dose on 07/19/23  CURRENT THERAPY: Encorafenib 450 mg p.o. twice daily and binimetinib 45 mg p.o. twice daily.  First dose July 22, 2023.   INTERVAL HISTORY: Chad Moreno 82 y.o. male returns to the clinic today for a follow-up visit. The patient is accompanied by wife today. The patient was last seen in the clinic by Dr. Arbutus Ped on 08/01/2023.  The patient was recently diagnosed with adenocarcinoma.  He is currently undergoing treatment with oral treatment with Encorafenib and binimetinib   At his last appointment, he was endorsing some fatigue, shortness of breath, and lack of energy.  His last being seen, his energy is stable.   The patient mentions he is scheduled to see Dr. Sande Brothers for his prostate cancer on Monday.  The patient is taking calcium supplement and  he is wondering if he needs to increase his dose.  He is planning on asking his urologist on Monday.  He receives injections through their office for his prostate cancer treatment.   The patient also establish care with ENT on 08/05/2023 for hoarseness which was felt to be related to compression of the recurrent laryngeal nerve.  They discussed VF injection augmentation on  September 19, 2023. March 27th.  He also is currently using Carafate for radiation esophagitis.     Today the patient denies any fever, chills, or night sweats.  He reports a good appetite but has lost a few pounds.  He reports some stable shortness of breath, particularly if bending over.  He states is similar to when he was last seen.  He denies any significant cough except sometimes coughing related to swallowing "down the wrong way". He denies any hemoptysis or chest pain.  I denies any nausea or vomiting.  He occasionally may have mild diarrhea every other day or so that is self-limited after 1 episode.  He has Imodium at home but has not needed to take it.  Denies any abdominal pain.  Denies any rashes or skin changes.  He may have very mild feet and ankle swelling intermittently.  He is here today for evaluation and repeat blood work.  MEDICAL HISTORY: Past Medical History:  Diagnosis Date   Age-related cataract of left eye    Amblyopia, right eye    Arthritis    Cancer (HCC)    Dysuria    ED (erectile dysfunction)    Elevated PSA    Epidermal cyst    Family history of  prostate cancer    Gout    Hip pain    Histoplasmosis    Hyperlipidemia    Hypermetropia, left eye    Hypertension    Impaired glucose tolerance    Joint pain    Left shoulder pain    Lower back pain    Melanocytic nevi, unspecified    Nummular dermatitis    Ocular hypertension    Palmar fascial fibromatosis    Prostate cancer screening    Regular astigmatism, right eye    Senile nuclear sclerosis     ALLERGIES:  is allergic to sulfa  antibiotics.  MEDICATIONS:  Current Outpatient Medications  Medication Sig Dispense Refill   amLODipine (NORVASC) 2.5 MG tablet Take 2.5 mg by mouth in the morning.     Ascorbic Acid (VITAMIN C PO) Take 1 tablet by mouth daily with breakfast.     aspirin EC 81 MG tablet Take 81 mg by mouth in the morning.     B Complex Vitamins (VITAMIN-B COMPLEX) TABS Take 1 tablet by mouth daily with breakfast.     binimetinib (MEKTOVI) 15 MG tablet Take 3 tablets (45 mg total) by mouth 2 (two) times daily. 180 tablet 3   Cholecalciferol (VITAMIN D3) 50 MCG (2000 UT) TABS Take 2,000 Units by mouth daily with breakfast.     cyanocobalamin (VITAMIN B12) 1000 MCG tablet Take 1,000 mcg by mouth daily.     encorafenib (BRAFTOVI) 75 MG capsule Take 6 capsules (450 mg total) by mouth daily. 180 capsule 3   losartan (COZAAR) 100 MG tablet Take 50 mg by mouth 2 (two) times daily.      megestrol (MEGACE) 20 MG tablet Take 1 tablet (20 mg total) by mouth daily. 30 tablet 2   meloxicam (MOBIC) 15 MG tablet Take 15 mg by mouth in the morning.     naproxen sodium (ALEVE) 220 MG tablet Take 220 mg by mouth daily as needed (soreness with golfing).     Omega-3 Fatty Acids (FISH OIL PO) Take 1 capsule by mouth daily with breakfast.     oxyCODONE-acetaminophen (PERCOCET/ROXICET) 5-325 MG tablet Take 1 tablet by mouth every 8 (eight) hours as needed for severe pain (pain score 7-10). 30 tablet 0   POTASSIUM PO Take 1 tablet by mouth daily with breakfast.     Probiotic Product (PROBIOTIC PEARLS ADVANTAGE PO) Take 1 capsule by mouth in the morning.     silodosin (RAPAFLO) 8 MG CAPS capsule Take 8 mg by mouth daily.     sucralfate (CARAFATE) 1 g tablet Take 1 tablet (1 g total) by mouth 4 (four) times daily. Dissolve each tablet in 15 cc water before use. 120 tablet 1   silodosin (RAPAFLO) 4 MG CAPS capsule Take 1 capsule (4 mg total) by mouth daily as needed (urinary retention.).     No current facility-administered medications  for this visit.    SURGICAL HISTORY:  Past Surgical History:  Procedure Laterality Date   BRONCHIAL BIOPSY  08/27/2022   Procedure: BRONCHIAL BIOPSIES;  Surgeon: Leslye Peer, MD;  Location: [facility] ENDOSCOPY;  Service: Pulmonary;;   BRONCHIAL BRUSHINGS  08/27/2022   Procedure: BRONCHIAL BRUSHINGS;  Surgeon: Leslye Peer, MD;  Location: [facility] ENDOSCOPY;  Service: Pulmonary;;   BRONCHIAL NEEDLE ASPIRATION BIOPSY  08/27/2022   Procedure: BRONCHIAL NEEDLE ASPIRATION BIOPSIES;  Surgeon: Leslye Peer, MD;  Location: [facility] ENDOSCOPY;  Service: Pulmonary;;   CONSTRICTING FINGER RING EXCISION Left    CYST REMOVAL TRUNK Left  left posterior shoulder   FIDUCIAL MARKER PLACEMENT  08/27/2022   Procedure: FIDUCIAL MARKER PLACEMENT;  Surgeon: Leslye Peer, MD;  Location: [facility] ENDOSCOPY;  Service: Pulmonary;;   GOLD SEED IMPLANT N/A 10/02/2022   Procedure: GOLD SEED IMPLANT;  Surgeon: Despina Arias, MD;  Location[facility];  Service: Urology;  Laterality: N/A;   index finger Left    IR THORACENTESIS ASP PLEURAL SPACE W/IMG GUIDE  06/14/2023   Prostate needle biopsy     SPACE OAR INSTILLATION N/A 10/02/2022   Procedure: SPACE OAR INSTILLATION;  Surgeon: Despina Arias, MD;  Location: [facility];  Service: Urology;  Laterality: N/A;    REVIEW OF SYSTEMS:   Review of Systems  Constitutional: Positive for stable fatigue and mild weight loss. Negative for appetite change, chills, fatigue, fever and unexpected weight change.  HENT:   Negative for mouth sores, nosebleeds, sore throat and trouble swallowing.   Eyes: Negative for eye problems and icterus.  Respiratory: Positive for mild dyspnea. Positive for mild cough related to aspiration. Negative for hemoptysis and wheezing.   Cardiovascular: Negative for chest pain and leg swelling (comes and goes).  Gastrointestinal: Positive for mild occasional diarrhea (self limiting). Negative for abdominal pain, constipation, nausea and  vomiting.  Genitourinary: Negative for bladder incontinence, difficulty urinating, dysuria, frequency and hematuria.   Musculoskeletal: Negative for back pain, gait problem, neck pain and neck stiffness.  Skin: Negative for itching and rash.  Neurological: Negative for dizziness, extremity weakness, gait problem, headaches, light-headedness and seizures.  Hematological: Negative for adenopathy. Does not bruise/bleed easily.  Psychiatric/Behavioral: Negative for confusion, depression and sleep disturbance. The patient is not nervous/anxious.     PHYSICAL EXAMINATION:  Blood pressure (!) 159/70, pulse 70, temperature 97.9 F (36.6 C), temperature source Temporal, resp. rate 14, weight 217 lb 6.4 oz (98.6 kg), SpO2 98%.  ECOG PERFORMANCE STATUS: 1  Physical Exam  Constitutional: Oriented to person, place, and time and well-developed, well-nourished, and in no distress.   HENT:  Head: Normocephalic and atraumatic.  Mouth/Throat: Oropharynx is clear and moist. No oropharyngeal exudate.  Eyes: Conjunctivae are normal. Right eye exhibits no discharge. Left eye exhibits no discharge. No scleral icterus.  Neck: Normal range of motion. Neck supple.  Cardiovascular: Normal rate, regular rhythm, normal heart sounds and intact distal pulses.   Pulmonary/Chest: Effort normal and breath sounds normal. No respiratory distress. No wheezes. No rales.  Abdominal: Soft. Bowel sounds are normal. Exhibits no distension and no mass. There is no tenderness.  Musculoskeletal: Normal range of motion. Exhibits no significant edema today.  Lymphadenopathy:    No cervical adenopathy.  Neurological: Alert and oriented to person, place, and time. Exhibits normal muscle tone. Gait normal. Coordination normal.  Skin: Skin is warm and dry. No rash noted. Not diaphoretic. No erythema. No pallor.  Psychiatric: Mood, memory and judgment normal.  Vitals reviewed.  LABORATORY DATA: Lab Results  Component Value Date    WBC 4.0 08/13/2023   HGB 10.7 (L) 08/13/2023   HCT 30.9 (L) 08/13/2023   MCV 89.8 08/13/2023   PLT 124 (L) 08/13/2023      Chemistry      Component Value Date/Time   NA 139 08/13/2023 0941   K 4.2 08/13/2023 0941   CL 109 08/13/2023 0941   CO2 24 08/13/2023 0941   BUN 19 08/13/2023 0941   CREATININE 0.92 08/13/2023 0941      Component Value Date/Time   CALCIUM 8.4 (L) 08/13/2023 1610  ALKPHOS 61 08/13/2023 0941   AST 15 08/13/2023 0941   ALT 13 08/13/2023 0941   BILITOT 0.5 08/13/2023 0941       RADIOGRAPHIC STUDIES:  MR BRAIN W WO CONTRAST Result Date: 07/31/2023 CLINICAL DATA:  Non-small cell lung cancer, staging EXAM: MRI HEAD WITHOUT AND WITH CONTRAST TECHNIQUE: Multiplanar, multiecho pulse sequences of the brain and surrounding structures were obtained without and with intravenous contrast. CONTRAST:  10mL GADAVIST GADOBUTROL 1 MMOL/ML IV SOLN COMPARISON:  03/12/2019 MRI FINDINGS: Brain: No restricted diffusion to suggest acute or subacute infarct. No abnormal parenchymal or meningeal enhancement. No acute hemorrhage, mass, mass effect, or midline shift. No hydrocephalus or extra-axial collection. Pituitary and craniocervical junction within normal limits. No hemosiderin deposition to suggest remote hemorrhage. Normal cerebral volume for age. Scattered T2 hyperintense signal in the periventricular white matter, likely the sequela of mild chronic small vessel ischemic disease. Vascular: Normal arterial flow voids. Normal arterial and venous enhancement. Skull and upper cervical spine: Normal marrow signal. Sinuses/Orbits: Clear paranasal sinuses. No acute finding in the orbits. Other: The mastoid air cells are well aerated. IMPRESSION: No acute intracranial process. No evidence of intracranial metastatic disease. Electronically Signed   By: Wiliam Ke M.D.   On: 07/31/2023 16:31   ECHOCARDIOGRAM LIMITED Result Date: 07/19/2023    ECHOCARDIOGRAM LIMITED REPORT   Patient Name:    Chad Moreno Date of Exam: 07/19/2023 Medical Rec #:  [id number]       Height:       72.0 in Accession #:    [id number]      Weight:       216.4 lb Date of Birth:  11-24-1940       BSA:          2.203 m Patient Age:    82 years        BP:           180/85 mmHg Patient Gender: M               HR:           66 bpm. Exam Location:  Outpatient Procedure: Cardiac Doppler, Color Doppler and Limited Echo Indications:    Chemo Z09  History:        Patient has no prior history of Echocardiogram examinations.                 Risk Factors:Hypertension.  Sonographer:    Neysa Bonito Roar Referring Phys: [id number] MOHAMED MOHAMED IMPRESSIONS  1. Left ventricular ejection fraction, by estimation, is 55%. The left ventricle has normal function. The left ventricle has no regional wall motion abnormalities. Left ventricular diastolic parameters are consistent with Grade I diastolic dysfunction (impaired relaxation).  2. Right ventricular systolic function is normal. The right ventricular size is normal. Tricuspid regurgitation signal is inadequate for assessing PA pressure.  3. The aortic valve is tricuspid. There is mild calcification of the aortic valve. Aortic valve regurgitation is trivial. No aortic stenosis is present.  4. The inferior vena cava is normal in size with greater than 50% respiratory variability, suggesting right atrial pressure of 3 mmHg.  5. IVC not visualized.  6. Limited echo. FINDINGS  Left Ventricle: Left ventricular ejection fraction, by estimation, is 55%. The left ventricle has normal function. The left ventricle has no regional wall motion abnormalities. The left ventricular internal cavity size was normal in size. There is no left ventricular hypertrophy. Left ventricular diastolic parameters are consistent with Grade I diastolic dysfunction (  impaired relaxation). Right Ventricle: The right ventricular size is normal. No increase in right ventricular wall thickness. Right ventricular systolic function is normal.  Tricuspid regurgitation signal is inadequate for assessing PA pressure. Left Atrium: Left atrial size was normal in size. Right Atrium: Right atrial size was normal in size. Pericardium: Trivial pericardial effusion is present. Tricuspid Valve: The tricuspid valve is normal in structure. Tricuspid valve regurgitation is not demonstrated. Aortic Valve: The aortic valve is tricuspid. There is mild calcification of the aortic valve. Aortic valve regurgitation is trivial. No aortic stenosis is present. Aortic valve mean gradient measures 3.0 mmHg. Aortic valve peak gradient measures 5.7 mmHg. Aortic valve area, by VTI measures 3.01 cm. Pulmonic Valve: The pulmonic valve was normal in structure. Pulmonic valve regurgitation is not visualized. Aorta: The aortic root is normal in size and structure. Venous: The inferior vena cava is normal in size with greater than 50% respiratory variability, suggesting right atrial pressure of 3 mmHg. IAS/Shunts: No atrial level shunt detected by color flow Doppler. Additional Comments: Color Doppler performed.  LEFT VENTRICLE PLAX 2D LVIDd:         4.80 cm     Diastology LVIDs:         3.60 cm     LV e' medial:    5.98 cm/s LV PW:         1.00 cm     LV E/e' medial:  10.0 LV IVS:        1.20 cm     LV e' lateral:   6.20 cm/s LVOT diam:     2.10 cm     LV E/e' lateral: 9.6 LV SV:         76 LV SV Index:   34 LVOT Area:     3.46 cm  LV Volumes (MOD) LV vol d, MOD A2C: 78.9 ml LV vol d, MOD A4C: 67.3 ml LV vol s, MOD A2C: 36.1 ml LV vol s, MOD A4C: 30.6 ml LV SV MOD A2C:     42.8 ml LV SV MOD A4C:     67.3 ml LV SV MOD BP:      38.8 ml RIGHT VENTRICLE RV Basal diam:  2.50 cm RV Mid diam:    2.30 cm RV S prime:     12.30 cm/s TAPSE (M-mode): 2.7 cm LEFT ATRIUM           Index LA diam:      3.50 cm 1.59 cm/m LA Vol (A4C): 71.2 ml 32.32 ml/m  AORTIC VALVE                    PULMONIC VALVE AV Area (Vmax):    3.26 cm     PV Vmax:        1.00 m/s AV Area (Vmean):   2.73 cm     PV Peak  grad:   4.0 mmHg AV Area (VTI):     3.01 cm     RVOT Peak grad: 2 mmHg AV Vmax:           119.00 cm/s AV Vmean:          86.400 cm/s AV VTI:            0.252 m AV Peak Grad:      5.7 mmHg AV Mean Grad:      3.0 mmHg LVOT Vmax:         112.00 cm/s LVOT Vmean:        68.000  cm/s LVOT VTI:          0.219 m LVOT/AV VTI ratio: 0.87  AORTA Ao Sinus diam: 3.60 cm Ao STJ diam:   3.1 cm MITRAL VALVE MV Area (PHT): 3.76 cm    SHUNTS MV Decel Time: 202 msec    Systemic VTI:  0.22 m MV E velocity: 59.70 cm/s  Systemic Diam: 2.10 cm MV A velocity: 99.00 cm/s MV E/A ratio:  0.60 Dalton McleanMD Electronically signed by Wilfred Lacy Signature Date/Time: 07/19/2023/10:31:31 AM    Final    DG Chest 1 View Result Date: 07/15/2023 CLINICAL DATA:  Reassess left pleural effusion accumulation EXAM: CHEST  1 VIEW COMPARISON:  Radiographs 06/14/2023 and 10/17/2022. CT 06/06/2023. PET-CT 06/18/2023. FINDINGS: 1530 hours. The heart size and mediastinal contours are stable with aortic atherosclerosis. A small left pleural effusion has reaccumulated in the interval. No significant pleural fluid on the right or pneumothorax. Bilateral pulmonary fiducial/localization markers with stable adjacent parenchymal opacities. The bones appear unchanged. IMPRESSION: Interval reaccumulation of a small left pleural effusion. No pneumothorax. Electronically Signed   By: Carey Bullocks M.D.   On: 07/15/2023 16:13     ASSESSMENT/PLAN:  This is a very pleasant 83 year old Caucasian male with recurrent/metastatic (T1c, N3, M1b) non-small cell lung cancer, adenocarcinoma.  He was initially diagnosed with early stage stage I in March 2024 which involve the right lower lobe and left lower lobe.  He is status post SBRT which was completed in May 2024.  More recently the patient presented with recurrent and metastatic disease to bilateral supraclavicular, subpectoral, left axillary, and upper abdominal lymphadenopathy and malignant left pleural  effusion in December 2024.  Cytology was positive for malignant cells from his pleural effusion.  Molecular studies by foundation 1 showed positive BRAF V600 E mutation.  His PD-L1 expression is 1%.  He completed you HRT to the lymphadenopathy under the care of Dr. Kathrynn Running in January 2025.  He is currently on treatment with encorafenib 450 mg p.o. twice daily in addition to binimetinib 45 mg milligram p.o. twice daily.  First dose was July 22, 2023.   Labs were reviewed.  His protein is a little bit low today and he was encouraged to increase his protein supplemental drink intake.  Recommend that he continue on the same treatment at the same dose.  The patient has recently been seen ENT due to hoarseness.  He is expected to undergo injections in March 2025.  He will continue using Carafate for his esophagitis.  He will continue to monitor for signs and symptoms of recurrent pleural effusion and we could always consider chest x-ray if his symptoms worsen.  Overall his breathing is stable today.  I will arrange for repeat CT scan in 4 weeks.  We will see him back a few days later to review the scan results.  For his intermittent leg swelling, he was advised to increase his protein intake, elevate his lower extremities, use compression stockings if needed, and reduce his intake of food high in salt.  He did not have any significant leg swelling at the time of his appointment.  The patient was advised to call immediately if he has any concerning symptoms in the interval. The patient voices understanding of current disease status and treatment options and is in agreement with the current care plan. All questions were answered. The patient knows to call the clinic with any problems, questions or concerns. We can certainly see the patient much sooner if necessary  Orders Placed This Encounter  Procedures   CT CHEST ABDOMEN PELVIS W CONTRAST    Standing Status:   Future    Expected  Date:   09/06/2023    Expiration Date:   08/12/2024    If indicated for the ordered procedure, I authorize the administration of contrast media per Radiology protocol:   Yes    Does the patient have a contrast media/X-ray dye allergy?:   No    Preferred imaging location?:   [facility]    If indicated for the ordered procedure, I authorize the administration of oral contrast media per Radiology protocol:   Yes   CBC with Differential (Cancer Center Only)    Standing Status:   Future    Expected Date:   09/10/2023    Expiration Date:   08/12/2024   CMP (Cancer Center only)    Standing Status:   Future    Expected Date:   09/10/2023    Expiration Date:   08/12/2024     The total time spent in the appointment was 20-29 minutes  Charle Mclaurin L Coretha Creswell, PA-C 08/13/23

## 2023-08-09 NOTE — Telephone Encounter (Signed)
[facility] [place] Clinical Research Coordinator note:   This visit for Subject Chad Moreno with DOB: [birth year] on 08/09/2023. Subject contacted regarding [id number] to inform that Principal Investigator has changed to Dr. Delton Coombes. Voicemail left requesting a return call.

## 2023-08-12 DIAGNOSIS — C61 Malignant neoplasm of prostate: Secondary | ICD-10-CM | POA: Diagnosis not present

## 2023-08-13 ENCOUNTER — Inpatient Hospital Stay: Payer: Medicare Other

## 2023-08-13 ENCOUNTER — Inpatient Hospital Stay (HOSPITAL_BASED_OUTPATIENT_CLINIC_OR_DEPARTMENT_OTHER): Payer: Medicare Other | Admitting: Physician Assistant

## 2023-08-13 VITALS — BP 159/70 | HR 70 | Temp 97.9°F | Resp 14 | Wt 217.4 lb

## 2023-08-13 DIAGNOSIS — J910 Malignant pleural effusion: Secondary | ICD-10-CM | POA: Diagnosis not present

## 2023-08-13 DIAGNOSIS — C3431 Malignant neoplasm of lower lobe, right bronchus or lung: Secondary | ICD-10-CM | POA: Diagnosis not present

## 2023-08-13 DIAGNOSIS — C3492 Malignant neoplasm of unspecified part of left bronchus or lung: Secondary | ICD-10-CM | POA: Diagnosis not present

## 2023-08-13 DIAGNOSIS — C778 Secondary and unspecified malignant neoplasm of lymph nodes of multiple regions: Secondary | ICD-10-CM | POA: Diagnosis not present

## 2023-08-13 DIAGNOSIS — C3412 Malignant neoplasm of upper lobe, left bronchus or lung: Secondary | ICD-10-CM | POA: Diagnosis not present

## 2023-08-13 LAB — CBC WITH DIFFERENTIAL (CANCER CENTER ONLY)
Abs Immature Granulocytes: 0.04 10*3/uL (ref 0.00–0.07)
Basophils Absolute: 0 10*3/uL (ref 0.0–0.1)
Basophils Relative: 1 %
Eosinophils Absolute: 0.1 10*3/uL (ref 0.0–0.5)
Eosinophils Relative: 2 %
HCT: 30.9 % — ABNORMAL LOW (ref 39.0–52.0)
Hemoglobin: 10.7 g/dL — ABNORMAL LOW (ref 13.0–17.0)
Immature Granulocytes: 1 %
Lymphocytes Relative: 15 %
Lymphs Abs: 0.6 10*3/uL — ABNORMAL LOW (ref 0.7–4.0)
MCH: 31.1 pg (ref 26.0–34.0)
MCHC: 34.6 g/dL (ref 30.0–36.0)
MCV: 89.8 fL (ref 80.0–100.0)
Monocytes Absolute: 0.5 10*3/uL (ref 0.1–1.0)
Monocytes Relative: 13 %
Neutro Abs: 2.7 10*3/uL (ref 1.7–7.7)
Neutrophils Relative %: 68 %
Platelet Count: 124 10*3/uL — ABNORMAL LOW (ref 150–400)
RBC: 3.44 MIL/uL — ABNORMAL LOW (ref 4.22–5.81)
RDW: 13.1 % (ref 11.5–15.5)
WBC Count: 4 10*3/uL (ref 4.0–10.5)
nRBC: 0 % (ref 0.0–0.2)

## 2023-08-13 LAB — CMP (CANCER CENTER ONLY)
ALT: 13 U/L (ref 0–44)
AST: 15 U/L (ref 15–41)
Albumin: 3.8 g/dL (ref 3.5–5.0)
Alkaline Phosphatase: 61 U/L (ref 38–126)
Anion gap: 6 (ref 5–15)
BUN: 19 mg/dL (ref 8–23)
CO2: 24 mmol/L (ref 22–32)
Calcium: 8.4 mg/dL — ABNORMAL LOW (ref 8.9–10.3)
Chloride: 109 mmol/L (ref 98–111)
Creatinine: 0.92 mg/dL (ref 0.61–1.24)
GFR, Estimated: >60 mL/min (ref >60)
Glucose, Bld: 119 mg/dL — ABNORMAL HIGH (ref 70–99)
Potassium: 4.2 mmol/L (ref 3.5–5.1)
Sodium: 139 mmol/L (ref 135–145)
Total Bilirubin: 0.5 mg/dL (ref 0.0–1.2)
Total Protein: 5.9 g/dL — ABNORMAL LOW (ref 6.5–8.1)

## 2023-08-19 DIAGNOSIS — C61 Malignant neoplasm of prostate: Secondary | ICD-10-CM | POA: Diagnosis not present

## 2023-08-27 ENCOUNTER — Other Ambulatory Visit (HOSPITAL_COMMUNITY): Payer: Self-pay

## 2023-08-30 ENCOUNTER — Other Ambulatory Visit (HOSPITAL_COMMUNITY): Payer: Self-pay

## 2023-09-06 ENCOUNTER — Other Ambulatory Visit: Payer: Self-pay

## 2023-09-06 ENCOUNTER — Ambulatory Visit (HOSPITAL_COMMUNITY)
Admission: RE | Admit: 2023-09-06 | Discharge: 2023-09-06 | Disposition: A | Discharge status: Home / Self Care | Payer: Medicare Other | Source: Ambulatory Visit | Attending: Physician Assistant | Admitting: Physician Assistant

## 2023-09-06 DIAGNOSIS — C3492 Malignant neoplasm of unspecified part of left bronchus or lung: Secondary | ICD-10-CM | POA: Insufficient documentation

## 2023-09-06 DIAGNOSIS — J90 Pleural effusion, not elsewhere classified: Secondary | ICD-10-CM | POA: Diagnosis not present

## 2023-09-06 DIAGNOSIS — N3289 Other specified disorders of bladder: Secondary | ICD-10-CM | POA: Diagnosis not present

## 2023-09-06 DIAGNOSIS — C7951 Secondary malignant neoplasm of bone: Secondary | ICD-10-CM | POA: Diagnosis not present

## 2023-09-06 MED ORDER — IOHEXOL 300 MG/ML  SOLN
100.0000 mL | Freq: Once | INTRAMUSCULAR | Status: AC | PRN
  Administered 2023-09-06: 100 mL via INTRAVENOUS

## 2023-09-09 ENCOUNTER — Other Ambulatory Visit: Payer: Self-pay

## 2023-09-09 NOTE — Progress Notes (Signed)
 Specialty Pharmacy Refill Coordination Note  Chad Moreno is a 83 y.o. male contacted today regarding refills of specialty medication(s) Binimetinib (MEKTOVI); Encorafenib (BRAFTOVI)   Patient requested Delivery   Delivery date: 09/20/23 (spoke to patient about updated delivery date)   Verified address: [address]   Medication will be filled on 09/19/23.

## 2023-09-12 ENCOUNTER — Inpatient Hospital Stay (HOSPITAL_BASED_OUTPATIENT_CLINIC_OR_DEPARTMENT_OTHER): Payer: Medicare Other | Admitting: Internal Medicine

## 2023-09-12 ENCOUNTER — Inpatient Hospital Stay: Payer: Medicare Other | Attending: Internal Medicine

## 2023-09-12 ENCOUNTER — Telehealth (INDEPENDENT_AMBULATORY_CARE_PROVIDER_SITE_OTHER): Payer: Self-pay | Admitting: Otolaryngology

## 2023-09-12 ENCOUNTER — Telehealth: Payer: Self-pay

## 2023-09-12 VITALS — BP 159/77 | HR 71 | Temp 98.0°F | Resp 16 | Ht 72.0 in | Wt 216.7 lb

## 2023-09-12 DIAGNOSIS — C778 Secondary and unspecified malignant neoplasm of lymph nodes of multiple regions: Secondary | ICD-10-CM | POA: Diagnosis not present

## 2023-09-12 DIAGNOSIS — C7951 Secondary malignant neoplasm of bone: Secondary | ICD-10-CM | POA: Insufficient documentation

## 2023-09-12 DIAGNOSIS — J910 Malignant pleural effusion: Secondary | ICD-10-CM | POA: Insufficient documentation

## 2023-09-12 DIAGNOSIS — C3412 Malignant neoplasm of upper lobe, left bronchus or lung: Secondary | ICD-10-CM | POA: Diagnosis not present

## 2023-09-12 DIAGNOSIS — Z1509 Genetic susceptibility to other malignant neoplasm: Secondary | ICD-10-CM | POA: Diagnosis not present

## 2023-09-12 DIAGNOSIS — C3431 Malignant neoplasm of lower lobe, right bronchus or lung: Secondary | ICD-10-CM | POA: Diagnosis not present

## 2023-09-12 DIAGNOSIS — C3492 Malignant neoplasm of unspecified part of left bronchus or lung: Secondary | ICD-10-CM

## 2023-09-12 LAB — CMP (CANCER CENTER ONLY)
ALT: 17 U/L (ref 0–44)
AST: 19 U/L (ref 15–41)
Albumin: 3.7 g/dL (ref 3.5–5.0)
Alkaline Phosphatase: 56 U/L (ref 38–126)
Anion gap: 6 (ref 5–15)
BUN: 19 mg/dL (ref 8–23)
CO2: 26 mmol/L (ref 22–32)
Calcium: 8.6 mg/dL — ABNORMAL LOW (ref 8.9–10.3)
Chloride: 109 mmol/L (ref 98–111)
Creatinine: 0.94 mg/dL (ref 0.61–1.24)
GFR, Estimated: >60 mL/min (ref >60)
Glucose, Bld: 99 mg/dL (ref 70–99)
Potassium: 4.7 mmol/L (ref 3.5–5.1)
Sodium: 141 mmol/L (ref 135–145)
Total Bilirubin: 0.4 mg/dL (ref 0.0–1.2)
Total Protein: 6.2 g/dL — ABNORMAL LOW (ref 6.5–8.1)

## 2023-09-12 LAB — CBC WITH DIFFERENTIAL (CANCER CENTER ONLY)
Abs Immature Granulocytes: 0.02 K/uL (ref 0.00–0.07)
Basophils Absolute: 0 K/uL (ref 0.0–0.1)
Basophils Relative: 1 %
Eosinophils Absolute: 0.2 K/uL (ref 0.0–0.5)
Eosinophils Relative: 4 %
HCT: 30.8 % — ABNORMAL LOW (ref 39.0–52.0)
Hemoglobin: 10.5 g/dL — ABNORMAL LOW (ref 13.0–17.0)
Immature Granulocytes: 1 %
Lymphocytes Relative: 19 %
Lymphs Abs: 0.7 K/uL (ref 0.7–4.0)
MCH: 30.4 pg (ref 26.0–34.0)
MCHC: 34.1 g/dL (ref 30.0–36.0)
MCV: 89.3 fL (ref 80.0–100.0)
Monocytes Absolute: 0.5 K/uL (ref 0.1–1.0)
Monocytes Relative: 13 %
Neutro Abs: 2.2 K/uL (ref 1.7–7.7)
Neutrophils Relative %: 62 %
Platelet Count: 115 K/uL — ABNORMAL LOW (ref 150–400)
RBC: 3.45 MIL/uL — ABNORMAL LOW (ref 4.22–5.81)
RDW: 13.6 % (ref 11.5–15.5)
WBC Count: 3.5 K/uL — ABNORMAL LOW (ref 4.0–10.5)
nRBC: 0 % (ref 0.0–0.2)

## 2023-09-12 NOTE — Progress Notes (Signed)
 [name] [phone]   [phone]  OFFICE PROGRESS NOTE  [name], MD [address] [place] [address]  DIAGNOSIS:  Recurrent/metastatic (T1c, N3, M1b) non-small cell lung cancer, adenocarcinoma initially diagnosed as early stage disease, stage I in March 2024 involving the right lower lobe and left upper lobe status post SBRT completed in May 2024.  The patient presented with recurrent and metastatic disease to bilateral supraclavicular, subpectoral, left axillary as well as upper abdominal lymphadenopathy and malignant left pleural effusion in December 2024 with confirmation of malignancy from the pleural effusion.   Biomarker Findings HRD signature - Cannot Be Determined Microsatellite status - Cannot Be Determined ? Tumor Mutational Burden - Cannot Be Determined Genomic Findings For a complete list of the genes assayed, please refer to the Appendix. BRAF V600E CHEK2 R474C CTNNB1 S45C DNMT3A W330* PPARG E3K 7 Disease relevant genes with no reportable alterations: ALK, EGFR, ERBB2, KRAS, MET, RET, ROS1  PDL1 TPS 1%  PRIOR THERAPY: None  CURRENT THERAPY:  Encorafenib 450 mg p.o. daily and binimetinib 45 mg p.o. twice daily.  First dose July 22, 2023.  INTERVAL HISTORY: Chad Moreno 83 y.o. male returns to the clinic today for follow-up visit accompanied by his wife.Discussed the use of AI scribe software for clinical note transcription with the patient, who gave verbal consent to proceed.  History of Present Illness   Chad Moreno "Chad Moreno" is an 83 year old male with metastatic non-small cell lung cancer who presents for evaluation with repeat CT scan for restaging of his disease. He is accompanied by his wife.  He was diagnosed with metastatic non-small cell lung cancer, adenocarcinoma, in March 2024. Molecular studies revealed a BRAF V600E mutation and PD-L1 expression of 1%. He began treatment with encorafenib 450  mg PO daily and Binimetinib 45 mg PO VLD on July 22, 2023. He is here for a repeat CT scan of the chest, abdomen, and pelvis for restaging. He notes improvement in his cancer, with a decrease in the size of the left pleural effusion and stable bone metastasis.  He experiences significant pain in the lower back on the right side, rated as 10 out of 10, which has been present for about a month. OxyContin helps take the edge off the pain, and a lidocaine pain patch provides some relief. The pain affects his ability to sleep if not managed with medication.  He reports ongoing fatigue and low energy levels, which may be related to his cancer treatment.  He inquires about the impact of his medications on his blood pressure, expressing concern about potential elevation.       MEDICAL HISTORY: Past Medical History:  Diagnosis Date   Age-related cataract of left eye    Amblyopia, right eye    Arthritis    Cancer (HCC)    Dysuria    ED (erectile dysfunction)    Elevated PSA    Epidermal cyst    Family history of prostate cancer    Gout    Hip pain    Histoplasmosis    Hyperlipidemia    Hypermetropia, left eye    Hypertension    Impaired glucose tolerance    Joint pain    Left shoulder pain    Lower back pain    Melanocytic nevi, unspecified    Nummular dermatitis    Ocular hypertension    Palmar fascial fibromatosis    Prostate cancer screening    Regular astigmatism,  right eye    Senile nuclear sclerosis     ALLERGIES:  is allergic to sulfa antibiotics.  MEDICATIONS:  Current Outpatient Medications  Medication Sig Dispense Refill   amLODipine (NORVASC) 2.5 MG tablet Take 2.5 mg by mouth in the morning.     Ascorbic Acid (VITAMIN C PO) Take 1 tablet by mouth daily with breakfast.     aspirin EC 81 MG tablet Take 81 mg by mouth in the morning.     B Complex Vitamins (VITAMIN-B COMPLEX) TABS Take 1 tablet by mouth daily with breakfast.     binimetinib (MEKTOVI) 15 MG tablet  Take 3 tablets (45 mg total) by mouth 2 (two) times daily. 180 tablet 3   Cholecalciferol (VITAMIN D3) 50 MCG (2000 UT) TABS Take 2,000 Units by mouth daily with breakfast.     cyanocobalamin (VITAMIN B12) 1000 MCG tablet Take 1,000 mcg by mouth daily.     encorafenib (BRAFTOVI) 75 MG capsule Take 6 capsules (450 mg total) by mouth daily. 180 capsule 3   losartan (COZAAR) 100 MG tablet Take 50 mg by mouth 2 (two) times daily.      megestrol (MEGACE) 20 MG tablet Take 1 tablet (20 mg total) by mouth daily. 30 tablet 2   meloxicam (MOBIC) 15 MG tablet Take 15 mg by mouth in the morning.     naproxen sodium (ALEVE) 220 MG tablet Take 220 mg by mouth daily as needed (soreness with golfing).     Omega-3 Fatty Acids (FISH OIL PO) Take 1 capsule by mouth daily with breakfast.     oxyCODONE-acetaminophen (PERCOCET/ROXICET) 5-325 MG tablet Take 1 tablet by mouth every 8 (eight) hours as needed for severe pain (pain score 7-10). 30 tablet 0   POTASSIUM PO Take 1 tablet by mouth daily with breakfast.     Probiotic Product (PROBIOTIC PEARLS ADVANTAGE PO) Take 1 capsule by mouth in the morning.     silodosin (RAPAFLO) 4 MG CAPS capsule Take 1 capsule (4 mg total) by mouth daily as needed (urinary retention.).     silodosin (RAPAFLO) 8 MG CAPS capsule Take 8 mg by mouth daily.     sucralfate (CARAFATE) 1 g tablet Take 1 tablet (1 g total) by mouth 4 (four) times daily. Dissolve each tablet in 15 cc water before use. 120 tablet 1   No current facility-administered medications for this visit.    SURGICAL HISTORY:  Past Surgical History:  Procedure Laterality Date   BRONCHIAL BIOPSY  08/27/2022   Procedure: BRONCHIAL BIOPSIES;  Surgeon: Leslye Peer, MD;  Location: [facility] ENDOSCOPY;  Service: Pulmonary;;   BRONCHIAL BRUSHINGS  08/27/2022   Procedure: BRONCHIAL BRUSHINGS;  Surgeon: Leslye Peer, MD;  Location: [facility] ENDOSCOPY;  Service: Pulmonary;;   BRONCHIAL NEEDLE ASPIRATION BIOPSY  08/27/2022   Procedure:  BRONCHIAL NEEDLE ASPIRATION BIOPSIES;  Surgeon: Leslye Peer, MD;  Location: [facility] ENDOSCOPY;  Service: Pulmonary;;   CONSTRICTING FINGER RING EXCISION Left    CYST REMOVAL TRUNK Left    left posterior shoulder   FIDUCIAL MARKER PLACEMENT  08/27/2022   Procedure: FIDUCIAL MARKER PLACEMENT;  Surgeon: Leslye Peer, MD;  Location: [facility] ENDOSCOPY;  Service: Pulmonary;;   GOLD SEED IMPLANT N/A 10/02/2022   Procedure: GOLD SEED IMPLANT;  Surgeon: Despina Arias, MD;  Location: [facility];  Service: Urology;  Laterality: N/A;   index finger Left    IR THORACENTESIS ASP PLEURAL SPACE W/IMG GUIDE  06/14/2023   Prostate needle biopsy  SPACE OAR INSTILLATION N/A 10/02/2022   Procedure: SPACE OAR INSTILLATION;  Surgeon: Despina Arias, MD;  Location: [facility];  Service: Urology;  Laterality: N/A;    REVIEW OF SYSTEMS:  Constitutional: positive for fatigue Eyes: negative Ears, nose, mouth, throat, and face: positive for hoarseness Respiratory: positive for pleurisy/chest pain Cardiovascular: negative Gastrointestinal: positive for nausea Genitourinary:negative Integument/breast: negative Hematologic/lymphatic: negative Musculoskeletal:negative Neurological: negative Behavioral/Psych: negative Endocrine: negative Allergic/Immunologic: negative   PHYSICAL EXAMINATION: General appearance: alert, cooperative, fatigued, and no distress Head: Normocephalic, without obvious abnormality, atraumatic Neck: no adenopathy, no JVD, supple, symmetrical, trachea midline, and thyroid not enlarged, symmetric, no tenderness/mass/nodules Lymph nodes: Cervical, supraclavicular, and axillary nodes normal. Resp: diminished breath sounds LLL and dullness to percussion LLL Back: symmetric, no curvature. ROM normal. No CVA tenderness. Cardio: regular rate and rhythm, S1, S2 normal, no murmur, click, rub or gallop GI: soft, non-tender; bowel sounds normal; no masses,  no  organomegaly Extremities: extremities normal, atraumatic, no cyanosis or edema Neurologic: Alert and oriented X 3, normal strength and tone. Normal symmetric reflexes. Normal coordination and gait  ECOG PERFORMANCE STATUS: 1 - Symptomatic but completely ambulatory  Blood pressure (!) 159/77, pulse 71, temperature 98 F (36.7 C), temperature source Temporal, resp. rate 16, height 6' (1.829 m), weight 216 lb 11.2 oz (98.3 kg), SpO2 100%.  LABORATORY DATA: Lab Results  Component Value Date   WBC 3.5 (L) 09/12/2023   HGB 10.5 (L) 09/12/2023   HCT 30.8 (L) 09/12/2023   MCV 89.3 09/12/2023   PLT 115 (L) 09/12/2023      Chemistry      Component Value Date/Time   NA 141 09/12/2023 0914   K 4.7 09/12/2023 0914   CL 109 09/12/2023 0914   CO2 26 09/12/2023 0914   BUN 19 09/12/2023 0914   CREATININE 0.94 09/12/2023 0914      Component Value Date/Time   CALCIUM 8.6 (L) 09/12/2023 0914   ALKPHOS 56 09/12/2023 0914   AST 19 09/12/2023 0914   ALT 17 09/12/2023 0914   BILITOT 0.4 09/12/2023 0914       RADIOGRAPHIC STUDIES: CT CHEST ABDOMEN PELVIS W CONTRAST Result Date: 09/11/2023 CLINICAL DATA:  Left lung non-small-cell lung carcinoma. Evaluate for metastatic disease. * Tracking Code: BO * EXAM: CT CHEST, ABDOMEN, AND PELVIS WITH CONTRAST TECHNIQUE: Multidetector CT imaging of the chest, abdomen and pelvis was performed following the standard protocol during bolus administration of intravenous contrast. RADIATION DOSE REDUCTION: This exam was performed according to the departmental dose-optimization program which includes automated exposure control, adjustment of the mA and/or kV according to patient size and/or use of iterative reconstruction technique. CONTRAST:  OMNIPAQUE IOHEXOL 300 MG/ML  SOLN COMPARISON:  PET-CT on 06/18/2023 FINDINGS: CT CHEST FINDINGS Cardiovascular: No acute findings. Mediastinum/Lymph Nodes: No masses or pathologically enlarged lymph nodes identified.  Previously seen mild lymphadenopathy in bilateral supraclavicular regions and left axilla have resolved since prior exam. Lungs/Pleura: Small to moderate left pleural effusion has decreased in size since previous study. Masslike opacity in the posterior left upper lobe with fiducial marker measures 4.7 x 2.5 cm on image 65/4, mildly decreased from 5.7 x 3.2 cm previously. Second masslike opacity in the superior right lower lobe measures 3.2 x 2.8 cm on image 68/4, compared to 3.5 x 3.2 cm previously. No new or enlarging pulmonary nodules or masses identified. Musculoskeletal:  No suspicious bone lesions identified. CT ABDOMEN AND PELVIS FINDINGS Hepatobiliary: No masses identified. Gallbladder is unremarkable. No evidence of biliary  ductal dilatation. Pancreas:  No mass or inflammatory changes. Spleen:  Within normal limits in size and appearance. Adrenals/Urinary tract: No suspicious masses or hydronephrosis. Stable diffuse bladder wall thickening, likely due to post radiation changes. Stomach/Bowel: No evidence of obstruction, inflammatory process, or abnormal fluid collections. Normal appendix visualized. Diffuse colonic diverticulosis again noted, without signs of diverticulitis. Vascular/Lymphatic: No pathologically enlarged lymph nodes identified. No acute vascular findings. Reproductive: Several fiducial markers again seen within the prostate. Other:  None. Musculoskeletal: Sclerotic bone metastases involving the left pelvis right posterior 9th rib show no significant change. IMPRESSION: Mild decrease in size of masslike opacities in the left upper and right lower lobes. Resolution of previously seen mild bilateral supraclavicular and left axillary lymphadenopathy. Stable sclerotic bone metastases. Decrease in size of small to moderate left pleural effusion. No new or progressive metastatic disease identified within the chest, abdomen, or pelvis. Stable mild diffuse bladder wall thickening, likely due to post  radiation changes. Colonic diverticulosis, without radiographic evidence of diverticulitis. Electronically Signed   By: Danae Orleans M.D.   On: 09/11/2023 14:35    ASSESSMENT AND PLAN: This is a very pleasant 83 years old white male with Recurrent/metastatic (T1c, N3, M1b) non-small cell lung cancer, adenocarcinoma initially diagnosed as early stage disease, stage I in March 2024 involving the right lower lobe and left upper lobe status post SBRT completed in May 2024.  The patient presented with recurrent and metastatic disease to bilateral supraclavicular, subpectoral, left axillary as well as upper abdominal lymphadenopathy and malignant left pleural effusion in December 2024 with confirmation of malignancy from the pleural effusion. Molecular studies by foundation 1 showed positive BRAF V600E mutation and PD-L1 expression of 1%. The patient is currently on treatment with encorafenib 450 mg p.o. twice daily in addition to binimetinib 45 mg milligram p.o. twice daily.  First dose was July 22, 2023. The patient has been tolerating this treatment well with no concerning adverse effect except for fatigue. He had repeat CT scan of the chest, abdomen and pelvis performed recently.  I personally independently reviewed the scan and discussed the result with the patient and his wife.  His scan showed decrease in the size of the mass like opacities in the left upper and right lower lobe with resolution of the previously seen mild bilateral supraclavicular and left axillary lymphadenopathy in addition to decrease in the size of the small to moderate left pleural effusion and a stable sclerotic bone metastasis.    Metastatic non-small cell lung cancer (NSCLC) with BRAF V600E mutation Diagnosed in March 2024 with adenocarcinoma subtype. Molecular studies showed positive BRAF V600E mutation and PD-L1 expression of 1%. Currently on encorafenib and Binimetinib since July 22, 2023. Recent CT scan shows no new or  progressive metastatic disease in the chest, abdomen, or pelvis. Lymph nodes in the neck and axilla have improved, and left pleural effusion has decreased. The cancer is not expected to disappear completely but is improving with treatment. - Continue encorafenib 450 mg PO daily and Binimetinib 45 mg PO BID - Refer to radiation oncologist Dr. Kathrynn Running for evaluation and potential radiation therapy for right posterior ninth rib pain  Right posterior ninth rib pain Severe pain rated 10/10, ongoing for about a month, possibly related to bone metastasis. Managed with OxyContin and lidocaine patches, providing some relief. No evidence of new metastatic disease in the area on recent imaging. Referral to radiation oncology is considered to address persistent pain. - Refer to radiation oncologist Dr. Kathrynn Running for evaluation  and potential radiation therapy  Fatigue Persistent fatigue, possibly related to cancer treatment. Fatigue may improve as the body adjusts to the medication. Informed that fatigue is a potential side effect, but it may decrease over time. - Reassess fatigue at next visit  Hypertension Inquires about the potential impact of cancer medications on blood pressure. Advised to monitor blood pressure, reduce salt intake, and consult with a family doctor if hypertension persists. - Monitor blood pressure - Consult with family doctor for hypertension management if needed  Follow-up Follow-up visit scheduled to monitor treatment response and manage ongoing symptoms. He will be seen in one month with repeat blood work. - Schedule follow-up appointment in one month with repeat blood work - Arrange appointment with radiation oncologist Dr. Kathrynn Running   The patient was advised to call immediately if he has any other concerning symptoms in the interval. The patient voices understanding of current disease status and treatment options and is in agreement with the current care plan.  All questions were  answered. The patient knows to call the clinic with any problems, questions or concerns. We can certainly see the patient much sooner if necessary.  The total time spent in the appointment was 30 minutes.  Disclaimer: This note was dictated with voice recognition software. Similar sounding words can inadvertently be transcribed and may not be corrected upon review.

## 2023-09-12 NOTE — Addendum Note (Signed)
 Addended by: Charma Igo on: 09/12/2023 11:30 AM   Modules accepted: Orders

## 2023-09-12 NOTE — Telephone Encounter (Signed)
 Thanks so much.

## 2023-09-12 NOTE — Telephone Encounter (Signed)
 Pt called back and agreed to move to 09-25-23 at 4pm//per Dr S to this time and Day as an option

## 2023-09-12 NOTE — Telephone Encounter (Signed)
 LVM due to one scope daily and there is another procedure this day, Dr. Leighton Roach schedule is also full this day.  Options for a work-in is Wednesday-09/25/2023 @ 4/4:15 p.m., Thursday-09/26/2023 @ 9:00 a.m., or Monday-09/30/2023 @ 9:15 a.m. are the days Dr. Irene Pap provided.

## 2023-09-16 NOTE — Progress Notes (Signed)
Apt canceled

## 2023-09-17 ENCOUNTER — Telehealth: Payer: Self-pay | Admitting: Radiation Oncology

## 2023-09-17 DIAGNOSIS — C7951 Secondary malignant neoplasm of bone: Secondary | ICD-10-CM | POA: Diagnosis not present

## 2023-09-17 DIAGNOSIS — C3492 Malignant neoplasm of unspecified part of left bronchus or lung: Secondary | ICD-10-CM | POA: Diagnosis not present

## 2023-09-17 NOTE — Telephone Encounter (Signed)
 3/25 @ 1:25 pm Called both patient contacts numbers, left voicemail for patient to call our office to be reschedule for recon appt -per Ashlyn Bruning.  Waiting on call back.

## 2023-09-19 ENCOUNTER — Ambulatory Visit: Admitting: Radiation Oncology

## 2023-09-19 ENCOUNTER — Ambulatory Visit
Admission: RE | Admit: 2023-09-19 | Discharge: 2023-09-19 | Discharge status: Home / Self Care | Source: Ambulatory Visit | Attending: Radiation Oncology | Admitting: Radiation Oncology

## 2023-09-19 ENCOUNTER — Institutional Professional Consult (permissible substitution): Admitting: Radiation Oncology

## 2023-09-19 ENCOUNTER — Encounter: Payer: Self-pay | Admitting: Pharmacy Technician

## 2023-09-19 ENCOUNTER — Ambulatory Visit (INDEPENDENT_AMBULATORY_CARE_PROVIDER_SITE_OTHER): Payer: Medicare Other | Admitting: Otolaryngology

## 2023-09-19 ENCOUNTER — Ambulatory Visit
Admission: RE | Admit: 2023-09-19 | Discharge: 2023-09-19 | Disposition: A | Discharge status: Home / Self Care | Source: Ambulatory Visit | Attending: Urology | Admitting: Urology

## 2023-09-19 ENCOUNTER — Ambulatory Visit

## 2023-09-19 ENCOUNTER — Other Ambulatory Visit: Payer: Self-pay

## 2023-09-19 ENCOUNTER — Other Ambulatory Visit (HOSPITAL_COMMUNITY): Payer: Self-pay

## 2023-09-19 ENCOUNTER — Encounter: Payer: Self-pay | Admitting: Radiation Oncology

## 2023-09-19 ENCOUNTER — Inpatient Hospital Stay: Admission: RE | Admit: 2023-09-19 | Source: Ambulatory Visit | Admitting: Radiation Oncology

## 2023-09-19 ENCOUNTER — Ambulatory Visit
Admission: RE | Admit: 2023-09-19 | Discharge: 2023-09-19 | Disposition: A | Discharge status: Home / Self Care | Source: Ambulatory Visit | Attending: Radiation Oncology | Admitting: Radiation Oncology

## 2023-09-19 VITALS — BP 159/75 | HR 76 | Resp 18 | Wt 216.0 lb

## 2023-09-19 VITALS — BP 159/75 | HR 76 | Temp 98.1°F | Resp 20 | Ht 72.0 in | Wt 216.0 lb

## 2023-09-19 DIAGNOSIS — C3490 Malignant neoplasm of unspecified part of unspecified bronchus or lung: Secondary | ICD-10-CM

## 2023-09-19 DIAGNOSIS — C7951 Secondary malignant neoplasm of bone: Secondary | ICD-10-CM | POA: Insufficient documentation

## 2023-09-19 DIAGNOSIS — Z79899 Other long term (current) drug therapy: Secondary | ICD-10-CM | POA: Insufficient documentation

## 2023-09-19 DIAGNOSIS — K5730 Diverticulosis of large intestine without perforation or abscess without bleeding: Secondary | ICD-10-CM | POA: Insufficient documentation

## 2023-09-19 DIAGNOSIS — C3492 Malignant neoplasm of unspecified part of left bronchus or lung: Secondary | ICD-10-CM | POA: Insufficient documentation

## 2023-09-19 DIAGNOSIS — R591 Generalized enlarged lymph nodes: Secondary | ICD-10-CM | POA: Insufficient documentation

## 2023-09-19 DIAGNOSIS — C61 Malignant neoplasm of prostate: Secondary | ICD-10-CM | POA: Insufficient documentation

## 2023-09-19 DIAGNOSIS — C3412 Malignant neoplasm of upper lobe, left bronchus or lung: Secondary | ICD-10-CM | POA: Diagnosis not present

## 2023-09-19 DIAGNOSIS — J90 Pleural effusion, not elsewhere classified: Secondary | ICD-10-CM | POA: Insufficient documentation

## 2023-09-19 DIAGNOSIS — Z191 Hormone sensitive malignancy status: Secondary | ICD-10-CM | POA: Diagnosis not present

## 2023-09-19 MED ORDER — OXYCODONE-ACETAMINOPHEN 7.5-325 MG PO TABS
1.0000 | ORAL_TABLET | Freq: Three times a day (TID) | ORAL | 0 refills | Status: DC | PRN
Start: 2023-09-19 — End: 2023-11-05

## 2023-09-19 NOTE — Progress Notes (Signed)
  Radiation Oncology         [phone] ________________________________  Name: Chad Moreno MRN: [id number]  Date: 09/19/2023  DOB: [birth year]  SIMULATION AND TREATMENT PLANNING NOTE    ICD-10-CM   1. Metastasis to bone (HCC)  C79.51     2. Non-small cell cancer of left lung (HCC)  C34.92       DIAGNOSIS:   83 yo man with a painful bony metastasis in the right 9th rib secondary to recurrent metastatic lung cancer (T1c, N3, M1b).   NARRATIVE:  The patient was brought to the CT Simulation planning suite.  Identity was confirmed.  All relevant records and images related to the planned course of therapy were reviewed.  The patient freely provided informed written consent to proceed with treatment after reviewing the details related to the planned course of therapy. The consent form was witnessed and verified by the simulation staff.  Then, the patient was set-up in a stable reproducible  supine position for radiation therapy.  CT images were obtained.  Surface markings were placed.  The CT images were loaded into the planning software.  Then the target and avoidance structures were contoured.  Treatment planning then occurred.  The radiation prescription was entered and confirmed.  Then, I designed and supervised the construction of a total of 3 medically necessary complex treatment devices consisting of body positioner and MLC apertures to cover the treated rib area.  I have requested : 3D Simulation  I have requested a DVH of the following structures: right lung, left lung, spinal cord, heart, esophagus and target.  PLAN:  The patient will receive 30 Gy in 10 fractions to the painful bony metastasis in the right 9th rib.  ________________________________  Artist Pais Kathrynn Running, M.D.

## 2023-09-19 NOTE — Telephone Encounter (Signed)
 A user error has taken place: encounter opened in error, closed for administrative reasons.

## 2023-09-19 NOTE — Progress Notes (Signed)
 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    ICD-10-CM   1. Metastasis to bone [facility])  C79.51       Interval Since Last Radiation:  2 months 07/10/23 - 07/19/23:  The nodal target in the AP window was treated to 36 Gy in 8 fractions of 4 Gy each.  10/22/22 - 11/30/22: The prostate and pelvic bone metastases were treated to 70 Gy in 28 fractions while simultaneously treating the elective pelvic nodes to 50.4 Gy in 28 fractions; concurrent with ADT.   09/25/22 - 10/01/22:  The targets in the RLL and LUL were treated to 54 Gy in 3 fractions of 18 Gy each  Narrative:  The patient returns today for routine follow-up at the request of Dr. Arbutus Ped.  He is currently on dual targeted therapy with  Encorafenib and binimetinib, under the care of Dr. Arbutus Ped, first dose July 22, 2023. His recent restaging CT C/A/P from 09/06/23 showed an excellent response to treatment with a decrease in the lung masses, lymphadenopathy and pleural effusion. There were no new or progressive metastases identified in the C/A/P. However, at his recent follow up visit with Dr. Arbutus Ped, he reported significant pain in the lower back on the right side, rated as 10 out of 10, which has been present for about a month. OxyCodone/APAP helps take the edge off the pain, and a lidocaine pain patch provides some relief. The pain affects his ability to sleep if not managed with medication and he feels this is progressively worsening so he has been kindly referred back to [place] today to discuss the potential role for palliative radiation to the painful osseous metastasis in the right 9th rib.                         ALLERGIES:  is allergic to sulfa antibiotics.  Meds: Current Outpatient Medications  Medication Sig Dispense Refill   amLODipine (NORVASC) 2.5 MG tablet Take 2.5 mg by mouth in the morning.     Ascorbic Acid (VITAMIN C PO) Take 1 tablet by mouth daily with breakfast.     aspirin EC 81 MG tablet Take 81 mg by mouth in the morning.     B Complex Vitamins (VITAMIN-B COMPLEX) TABS Take 1 tablet by mouth daily with breakfast.     binimetinib (MEKTOVI) 15 MG tablet Take 3 tablets (45 mg total) by mouth 2 (two) times daily. 180 tablet 3   calcium carbonate (OSCAL) 1500 (600 Ca) MG TABS tablet Take 1,500 mg by mouth 2 (two) times daily with a meal.     Cholecalciferol (VITAMIN D3) 50 MCG (2000 UT) TABS Take 2,000 Units by mouth daily with breakfast.     cyanocobalamin (VITAMIN B12) 1000 MCG tablet Take 1,000 mcg by mouth daily.     encorafenib (BRAFTOVI) 75 MG capsule Take 6 capsules (450 mg total) by mouth daily. 180 capsule 3   fluticasone (FLONASE) 50 MCG/ACT nasal spray Place 2 sprays into both nostrils daily.     losartan (COZAAR) 100 MG tablet Take 50 mg by mouth 2 (two) times daily.  megestrol (MEGACE) 20 MG tablet Take 1 tablet (20 mg total) by mouth daily. 30 tablet 2   meloxicam (MOBIC) 15 MG tablet Take 15 mg by mouth in the morning.     naproxen sodium (ALEVE) 220 MG tablet Take 220 mg by mouth daily as needed (soreness with golfing).     Omega-3 Fatty Acids (FISH OIL PO) Take 1 capsule by mouth daily with breakfast.     oxyCODONE-acetaminophen (PERCOCET/ROXICET) 5-325 MG tablet Take 1 tablet by mouth every 8 (eight) hours as needed for severe pain (pain score 7-10). 30 tablet 0   POTASSIUM PO Take 1 tablet by mouth daily with breakfast.     Probiotic Product (PROBIOTIC PEARLS ADVANTAGE PO) Take 1 capsule by mouth in the morning.     silodosin (RAPAFLO) 8 MG CAPS capsule Take 8 mg by mouth daily.     sucralfate (CARAFATE) 1 g tablet Take 1 tablet (1 g total) by mouth 4 (four)  times daily. Dissolve each tablet in 15 cc water before use. 120 tablet 1   No current facility-administered medications for this encounter.    Physical Findings: The patient is in no acute distress. Patient is alert and oriented.  weight is 216 lb (98 kg). His blood pressure is 159/75 (abnormal) and his pulse is 76. His respiration is 18 and oxygen saturation is 100%. .  No significant changes.  Lab Findings: Lab Results  Component Value Date   WBC 3.5 (L) 09/12/2023   WBC 8.3 10/17/2022   HGB 10.5 (L) 09/12/2023   HCT 30.8 (L) 09/12/2023   PLT 115 (L) 09/12/2023    Lab Results  Component Value Date   NA 141 09/12/2023   K 4.7 09/12/2023   CO2 26 09/12/2023   GLUCOSE 99 09/12/2023   BUN 19 09/12/2023   CREATININE 0.94 09/12/2023   BILITOT 0.4 09/12/2023   ALKPHOS 56 09/12/2023   AST 19 09/12/2023   ALT 17 09/12/2023   PROT 6.2 (L) 09/12/2023   ALBUMIN 3.7 09/12/2023   CALCIUM 8.6 (L) 09/12/2023   ANIONGAP 6 09/12/2023    Radiographic Findings: CT CHEST ABDOMEN PELVIS W CONTRAST Result Date: 09/11/2023 CLINICAL DATA:  Left lung non-small-cell lung carcinoma. Evaluate for metastatic disease. * Tracking Code: BO * EXAM: CT CHEST, ABDOMEN, AND PELVIS WITH CONTRAST TECHNIQUE: Multidetector CT imaging of the chest, abdomen and pelvis was performed following the standard protocol during bolus administration of intravenous contrast. RADIATION DOSE REDUCTION: This exam was performed according to the departmental dose-optimization program which includes automated exposure control, adjustment of the mA and/or kV according to patient size and/or use of iterative reconstruction technique. CONTRAST:  OMNIPAQUE IOHEXOL 300 MG/ML  SOLN COMPARISON:  PET-CT on 06/18/2023 FINDINGS: CT CHEST FINDINGS Cardiovascular: No acute findings. Mediastinum/Lymph Nodes: No masses or pathologically enlarged lymph nodes identified. Previously seen mild lymphadenopathy in bilateral supraclavicular  regions and left axilla have resolved since prior exam. Lungs/Pleura: Small to moderate left pleural effusion has decreased in size since previous study. Masslike opacity in the posterior left upper lobe with fiducial marker measures 4.7 x 2.5 cm on image 65/4, mildly decreased from 5.7 x 3.2 cm previously. Second masslike opacity in the superior right lower lobe measures 3.2 x 2.8 cm on image 68/4, compared to 3.5 x 3.2 cm previously. No new or enlarging pulmonary nodules or masses identified. Musculoskeletal:  No suspicious bone lesions identified. CT ABDOMEN AND PELVIS FINDINGS Hepatobiliary: No masses identified. Gallbladder is unremarkable. No evidence of biliary ductal dilatation.  Pancreas:  No mass or inflammatory changes. Spleen:  Within normal limits in size and appearance. Adrenals/Urinary tract: No suspicious masses or hydronephrosis. Stable diffuse bladder wall thickening, likely due to post radiation changes. Stomach/Bowel: No evidence of obstruction, inflammatory process, or abnormal fluid collections. Normal appendix visualized. Diffuse colonic diverticulosis again noted, without signs of diverticulitis. Vascular/Lymphatic: No pathologically enlarged lymph nodes identified. No acute vascular findings. Reproductive: Several fiducial markers again seen within the prostate. Other:  None. Musculoskeletal: Sclerotic bone metastases involving the left pelvis right posterior 9th rib show no significant change. IMPRESSION: Mild decrease in size of masslike opacities in the left upper and right lower lobes. Resolution of previously seen mild bilateral supraclavicular and left axillary lymphadenopathy. Stable sclerotic bone metastases. Decrease in size of small to moderate left pleural effusion. No new or progressive metastatic disease identified within the chest, abdomen, or pelvis. Stable mild diffuse bladder wall thickening, likely due to post radiation changes. Colonic diverticulosis, without radiographic  evidence of diverticulitis. Electronically Signed   By: Danae Orleans M.D.   On: 09/11/2023 14:35    Impression:  The patient is having significant, progressive pain in the right flank which correlates with an osseous metastasis in the right 9th rib and will likely benefit from a 2 week course of palliative radiation for more durable pain control.  Plan:  Today, I talked to the patient and his wife about the findings and workup thus far. We discussed the natural history of metastatic lung  cancer and general treatment, highlighting the role of radiotherapy in the management of painful osseous disease. We discussed the available radiation techniques, and focused on the details and logistics of delivery. The recommendation is for a 2 week course of daily palliative radiation to the painful metastasis in the right 9th rib. We reviewed the anticipated acute and late sequelae associated with radiation in this setting. The patient was encouraged to ask questions that were answered to his stated satisfaction and he is in agreement to proceed. He has freely signed written consent to proceed today in the office and copy of this document will be placed in his medical record. We will share our discussion with Dr. Arbutus Ped and proceed with CT SIM/treatment planning at am this morning, in anticipation of beginning his daily treatments in the near future. I enjoyed meeting with him again today and look forward to continuing to participate in his care.  I refilled his OxyCodone/APAP.  I personally spent 45 minutes in this encounter including chart review, reviewing radiological studies, meeting face-to-face with the patient, entering orders and completing documentation.  _____________________________________  Artist Pais Kathrynn Running, M.D.

## 2023-09-19 NOTE — Progress Notes (Signed)
 Nursing interview for discussion of "Radiation therapy to right posterior ninth rib, for severe pain control, 10/10, x1 month."   Patient identity verified x2.  Patient reports Pain in his rt side. Patient denies any other related issues at this time.  Meaningful use complete.  Vitals-  Vitals:   09/19/23 0823  BP: (!) 159/75  Pulse: 76  Resp: 20  Temp: 98.1 F (36.7 C)  SpO2: 98%  Weight: 216 lb (98 kg)  Height: 6' (1.829 m)     This concludes the interaction.

## 2023-09-23 DIAGNOSIS — M109 Gout, unspecified: Secondary | ICD-10-CM | POA: Diagnosis not present

## 2023-09-23 DIAGNOSIS — R7302 Impaired glucose tolerance (oral): Secondary | ICD-10-CM | POA: Diagnosis not present

## 2023-09-23 DIAGNOSIS — E669 Obesity, unspecified: Secondary | ICD-10-CM | POA: Diagnosis not present

## 2023-09-23 DIAGNOSIS — M1990 Unspecified osteoarthritis, unspecified site: Secondary | ICD-10-CM | POA: Diagnosis not present

## 2023-09-23 DIAGNOSIS — H5440 Blindness, one eye, unspecified eye: Secondary | ICD-10-CM | POA: Diagnosis not present

## 2023-09-23 DIAGNOSIS — R490 Dysphonia: Secondary | ICD-10-CM | POA: Diagnosis not present

## 2023-09-23 DIAGNOSIS — I10 Essential (primary) hypertension: Secondary | ICD-10-CM | POA: Diagnosis not present

## 2023-09-23 DIAGNOSIS — E785 Hyperlipidemia, unspecified: Secondary | ICD-10-CM | POA: Diagnosis not present

## 2023-09-23 DIAGNOSIS — F4329 Adjustment disorder with other symptoms: Secondary | ICD-10-CM | POA: Diagnosis not present

## 2023-09-23 DIAGNOSIS — C3490 Malignant neoplasm of unspecified part of unspecified bronchus or lung: Secondary | ICD-10-CM | POA: Diagnosis not present

## 2023-09-23 DIAGNOSIS — C61 Malignant neoplasm of prostate: Secondary | ICD-10-CM | POA: Diagnosis not present

## 2023-09-23 DIAGNOSIS — D649 Anemia, unspecified: Secondary | ICD-10-CM | POA: Diagnosis not present

## 2023-09-24 ENCOUNTER — Ambulatory Visit
Admission: RE | Admit: 2023-09-24 | Discharge: 2023-09-24 | Disposition: A | Discharge status: Home / Self Care | Source: Ambulatory Visit | Attending: Radiation Oncology | Admitting: Radiation Oncology

## 2023-09-24 DIAGNOSIS — C3492 Malignant neoplasm of unspecified part of left bronchus or lung: Secondary | ICD-10-CM | POA: Insufficient documentation

## 2023-09-24 DIAGNOSIS — C7951 Secondary malignant neoplasm of bone: Secondary | ICD-10-CM | POA: Insufficient documentation

## 2023-09-25 ENCOUNTER — Ambulatory Visit (INDEPENDENT_AMBULATORY_CARE_PROVIDER_SITE_OTHER): Admitting: Otolaryngology

## 2023-09-25 DIAGNOSIS — R490 Dysphonia: Secondary | ICD-10-CM | POA: Diagnosis not present

## 2023-09-25 DIAGNOSIS — J3801 Paralysis of vocal cords and larynx, unilateral: Secondary | ICD-10-CM

## 2023-09-25 DIAGNOSIS — C3492 Malignant neoplasm of unspecified part of left bronchus or lung: Secondary | ICD-10-CM | POA: Diagnosis not present

## 2023-09-25 DIAGNOSIS — J383 Other diseases of vocal cords: Secondary | ICD-10-CM

## 2023-09-25 DIAGNOSIS — C7951 Secondary malignant neoplasm of bone: Secondary | ICD-10-CM | POA: Diagnosis not present

## 2023-09-25 NOTE — Progress Notes (Signed)
 Therapeutic Vocal Cord Injection CPT 33295-18 ATTENDING: Ashok Croon, MD   PREOPERATIVE DIAGNOSIS(ES): 1. left vocal cord paralysis 2. Hoarseness; atrophy bilateral 3. Glottic insufficiency  POSTOPERATIVE DIAGNOSIS(ES): Same  PROCEDURE PERFORMED: Laryngoscopy with restylane injection into the bilateral lateral thyroarytenoid muscle(s)  INDICATIONS FOR PROCEDURE: The risks and benefits of the surgical procedure have been explained in detail to the patient and they have elected to proceed.  CONSENT:  Informed consent was obtained prior to the procedure after discussion of risks, benefits, and alternatives and expected outcomes were discussed with the patient; consent placed in chart. The possibilities of reaction to medication, pulmonary aspiration, bleeding, infection, the need for additional procedures, failure to diagnose a condition, and creating a complication requiring transfusion or operation were discussed with the patient. The patient concurred with the proposed plan, giving informed consent.    UNIVERSAL PROTOCOL/ TIMEOUT: Preprocedure verification is complete- patient verified and consents confirmed.  ANESTHESIA: local anesthesia H&P REVIEW: The patient's history and physical were reviewed today prior to procedure. All medications were reviewed and updated as well.  PROCEDURE DETAILS:  The patient was brought to the clinic and placed in a seated position.  The anterior neck skin was then cleansed with alcohol and 1% Lidocaine was used to infiltrate the skin overlying the thyrohyoid membrane. Patient had a NEB with 4% lidocaine prior the start of the procedure, to ensure good anesthesia and procedure tolerance. Afrin/Lidocaine mixture was then used to anesthetize the nasal passages. The 1.5-inch 25-gauge needle was bent with two gentle curves in same direction.  The flexible laryngoscope was then passed through the patient's nasal passageway and advanced into the larynx. The  nasopharynx was free of any lesions. The scope was passed behind the soft palate and directed toward the base of tongue. The supraglottic structures were normal in appearance. The true folds show atrophy and left vocal fold paralysis. The 23-gauge needle was passed through the petiole and 4% lidocaine was dripped on the cords while the patient phonated. It was then attached to a luer-lock syringe filled with the filler and advanced into the vocal fold, and injection was performed into the bilateral thyroarytenoid muscle(s) at a site just anterior to the vocal process. A second injection was performed at mid-fold. The augmentation carried out until some overmedialization was noted. This was then repeated on the contralateral side. The needle was then removed from the vocal fold and the airway. The scope was removed from the nasal cavity. This completed the procedure. The patient tolerated the procedure well. IMPLANTS: Restylane-L Hyaluronic Acid  ESTIMATED BLOOD LOSS: None  SPECIMEN(S) REMOVED: None  DISPOSITION OF SPECIMEN(S): NA.  FINDINGS:  No evidence of a hematoma and the airway remained patent  CONDITION: Stable  COMPLICATIONS:The patient tolerated the procedure well without apparent complications and was ambulatory.  NOTES: he might require repeat injection 2/2 large glottic gap, scoped via left naris, needs more numbing spray next time  PLAN: RTC 2-3 weeks for repeat videostrobe

## 2023-09-26 ENCOUNTER — Ambulatory Visit

## 2023-09-26 ENCOUNTER — Other Ambulatory Visit: Payer: Self-pay

## 2023-09-26 DIAGNOSIS — C3492 Malignant neoplasm of unspecified part of left bronchus or lung: Secondary | ICD-10-CM | POA: Diagnosis not present

## 2023-09-26 DIAGNOSIS — C7951 Secondary malignant neoplasm of bone: Secondary | ICD-10-CM | POA: Diagnosis not present

## 2023-09-26 LAB — RAD ONC ARIA SESSION SUMMARY
Course Elapsed Days: 0
Plan Fractions Treated to Date: 1
Plan Prescribed Dose Per Fraction: 3 Gy
Plan Total Fractions Prescribed: 10
Plan Total Prescribed Dose: 30 Gy
Reference Point Dosage Given to Date: 3 Gy
Reference Point Session Dosage Given: 3 Gy
Session Number: 1

## 2023-09-27 ENCOUNTER — Ambulatory Visit

## 2023-09-27 ENCOUNTER — Ambulatory Visit
Admission: RE | Admit: 2023-09-27 | Discharge: 2023-09-27 | Disposition: A | Discharge status: Home / Self Care | Source: Ambulatory Visit | Attending: Radiation Oncology | Admitting: Radiation Oncology

## 2023-09-27 ENCOUNTER — Other Ambulatory Visit: Payer: Self-pay

## 2023-09-27 DIAGNOSIS — Z510 Encounter for antineoplastic radiation therapy: Secondary | ICD-10-CM | POA: Diagnosis not present

## 2023-09-27 DIAGNOSIS — C7951 Secondary malignant neoplasm of bone: Secondary | ICD-10-CM | POA: Diagnosis not present

## 2023-09-27 DIAGNOSIS — C3492 Malignant neoplasm of unspecified part of left bronchus or lung: Secondary | ICD-10-CM | POA: Diagnosis not present

## 2023-09-27 LAB — RAD ONC ARIA SESSION SUMMARY
Course Elapsed Days: 1
Plan Fractions Treated to Date: 2
Plan Prescribed Dose Per Fraction: 3 Gy
Plan Total Fractions Prescribed: 10
Plan Total Prescribed Dose: 30 Gy
Reference Point Dosage Given to Date: 6 Gy
Reference Point Session Dosage Given: 3 Gy
Session Number: 2

## 2023-09-30 ENCOUNTER — Other Ambulatory Visit: Payer: Self-pay

## 2023-09-30 ENCOUNTER — Ambulatory Visit
Admission: RE | Admit: 2023-09-30 | Discharge: 2023-09-30 | Disposition: A | Discharge status: Home / Self Care | Source: Ambulatory Visit | Attending: Radiation Oncology | Admitting: Radiation Oncology

## 2023-09-30 DIAGNOSIS — C7951 Secondary malignant neoplasm of bone: Secondary | ICD-10-CM | POA: Diagnosis not present

## 2023-09-30 DIAGNOSIS — Z510 Encounter for antineoplastic radiation therapy: Secondary | ICD-10-CM | POA: Diagnosis not present

## 2023-09-30 DIAGNOSIS — C3492 Malignant neoplasm of unspecified part of left bronchus or lung: Secondary | ICD-10-CM | POA: Diagnosis not present

## 2023-09-30 LAB — RAD ONC ARIA SESSION SUMMARY
Course Elapsed Days: 4
Plan Fractions Treated to Date: 3
Plan Prescribed Dose Per Fraction: 3 Gy
Plan Total Fractions Prescribed: 10
Plan Total Prescribed Dose: 30 Gy
Reference Point Dosage Given to Date: 9 Gy
Reference Point Session Dosage Given: 3 Gy
Session Number: 3

## 2023-10-01 ENCOUNTER — Other Ambulatory Visit: Payer: Self-pay

## 2023-10-01 ENCOUNTER — Ambulatory Visit
Admission: RE | Admit: 2023-10-01 | Discharge: 2023-10-01 | Disposition: A | Discharge status: Home / Self Care | Source: Ambulatory Visit | Attending: Radiation Oncology

## 2023-10-01 DIAGNOSIS — C3492 Malignant neoplasm of unspecified part of left bronchus or lung: Secondary | ICD-10-CM | POA: Diagnosis not present

## 2023-10-01 DIAGNOSIS — C7951 Secondary malignant neoplasm of bone: Secondary | ICD-10-CM | POA: Diagnosis not present

## 2023-10-01 DIAGNOSIS — Z510 Encounter for antineoplastic radiation therapy: Secondary | ICD-10-CM | POA: Diagnosis not present

## 2023-10-01 LAB — RAD ONC ARIA SESSION SUMMARY
Course Elapsed Days: 5
Plan Fractions Treated to Date: 4
Plan Prescribed Dose Per Fraction: 3 Gy
Plan Total Fractions Prescribed: 10
Plan Total Prescribed Dose: 30 Gy
Reference Point Dosage Given to Date: 12 Gy
Reference Point Session Dosage Given: 3 Gy
Session Number: 4

## 2023-10-02 ENCOUNTER — Other Ambulatory Visit: Payer: Self-pay

## 2023-10-02 ENCOUNTER — Ambulatory Visit
Admission: RE | Admit: 2023-10-02 | Discharge: 2023-10-02 | Disposition: A | Discharge status: Home / Self Care | Source: Ambulatory Visit | Attending: Radiation Oncology

## 2023-10-02 DIAGNOSIS — Z510 Encounter for antineoplastic radiation therapy: Secondary | ICD-10-CM | POA: Diagnosis not present

## 2023-10-02 DIAGNOSIS — C3492 Malignant neoplasm of unspecified part of left bronchus or lung: Secondary | ICD-10-CM | POA: Diagnosis not present

## 2023-10-02 DIAGNOSIS — C7951 Secondary malignant neoplasm of bone: Secondary | ICD-10-CM | POA: Diagnosis not present

## 2023-10-02 LAB — RAD ONC ARIA SESSION SUMMARY
Course Elapsed Days: 6
Plan Fractions Treated to Date: 5
Plan Prescribed Dose Per Fraction: 3 Gy
Plan Total Fractions Prescribed: 10
Plan Total Prescribed Dose: 30 Gy
Reference Point Dosage Given to Date: 15 Gy
Reference Point Session Dosage Given: 1.8646 Gy
Session Number: 5

## 2023-10-03 ENCOUNTER — Ambulatory Visit
Admission: RE | Admit: 2023-10-03 | Discharge: 2023-10-03 | Discharge status: Home / Self Care | Source: Ambulatory Visit | Attending: Radiation Oncology

## 2023-10-03 ENCOUNTER — Other Ambulatory Visit: Payer: Self-pay

## 2023-10-03 DIAGNOSIS — Z510 Encounter for antineoplastic radiation therapy: Secondary | ICD-10-CM | POA: Diagnosis not present

## 2023-10-03 DIAGNOSIS — C7951 Secondary malignant neoplasm of bone: Secondary | ICD-10-CM | POA: Diagnosis not present

## 2023-10-03 DIAGNOSIS — C3492 Malignant neoplasm of unspecified part of left bronchus or lung: Secondary | ICD-10-CM | POA: Diagnosis not present

## 2023-10-03 LAB — RAD ONC ARIA SESSION SUMMARY
Course Elapsed Days: 7
Plan Fractions Treated to Date: 6
Plan Prescribed Dose Per Fraction: 3 Gy
Plan Total Fractions Prescribed: 10
Plan Total Prescribed Dose: 30 Gy
Reference Point Dosage Given to Date: 18 Gy
Reference Point Session Dosage Given: 3 Gy
Session Number: 6

## 2023-10-04 ENCOUNTER — Ambulatory Visit
Admission: RE | Admit: 2023-10-04 | Discharge: 2023-10-04 | Disposition: A | Discharge status: Home / Self Care | Source: Ambulatory Visit | Attending: Radiation Oncology | Admitting: Radiation Oncology

## 2023-10-04 ENCOUNTER — Other Ambulatory Visit: Payer: Self-pay

## 2023-10-04 DIAGNOSIS — Z510 Encounter for antineoplastic radiation therapy: Secondary | ICD-10-CM | POA: Diagnosis not present

## 2023-10-04 DIAGNOSIS — C7951 Secondary malignant neoplasm of bone: Secondary | ICD-10-CM | POA: Diagnosis not present

## 2023-10-04 DIAGNOSIS — C3492 Malignant neoplasm of unspecified part of left bronchus or lung: Secondary | ICD-10-CM | POA: Diagnosis not present

## 2023-10-04 LAB — RAD ONC ARIA SESSION SUMMARY
Course Elapsed Days: 8
Plan Fractions Treated to Date: 7
Plan Prescribed Dose Per Fraction: 3 Gy
Plan Total Fractions Prescribed: 10
Plan Total Prescribed Dose: 30 Gy
Reference Point Dosage Given to Date: 21 Gy
Reference Point Session Dosage Given: 3 Gy
Session Number: 7

## 2023-10-07 ENCOUNTER — Ambulatory Visit
Admission: RE | Admit: 2023-10-07 | Discharge: 2023-10-07 | Disposition: A | Discharge status: Home / Self Care | Source: Ambulatory Visit | Attending: Radiation Oncology | Admitting: Radiation Oncology

## 2023-10-07 ENCOUNTER — Other Ambulatory Visit: Payer: Self-pay

## 2023-10-07 DIAGNOSIS — C7951 Secondary malignant neoplasm of bone: Secondary | ICD-10-CM | POA: Diagnosis not present

## 2023-10-07 DIAGNOSIS — Z510 Encounter for antineoplastic radiation therapy: Secondary | ICD-10-CM | POA: Diagnosis not present

## 2023-10-07 DIAGNOSIS — C3492 Malignant neoplasm of unspecified part of left bronchus or lung: Secondary | ICD-10-CM | POA: Diagnosis not present

## 2023-10-07 LAB — RAD ONC ARIA SESSION SUMMARY
Course Elapsed Days: 11
Plan Fractions Treated to Date: 8
Plan Prescribed Dose Per Fraction: 3 Gy
Plan Total Fractions Prescribed: 10
Plan Total Prescribed Dose: 30 Gy
Reference Point Dosage Given to Date: 24 Gy
Reference Point Session Dosage Given: 3 Gy
Session Number: 8

## 2023-10-08 ENCOUNTER — Ambulatory Visit
Admission: RE | Admit: 2023-10-08 | Discharge: 2023-10-08 | Disposition: A | Discharge status: Home / Self Care | Source: Ambulatory Visit | Attending: Radiation Oncology | Admitting: Radiation Oncology

## 2023-10-08 ENCOUNTER — Other Ambulatory Visit: Payer: Self-pay

## 2023-10-08 DIAGNOSIS — C7951 Secondary malignant neoplasm of bone: Secondary | ICD-10-CM | POA: Diagnosis not present

## 2023-10-08 DIAGNOSIS — C3492 Malignant neoplasm of unspecified part of left bronchus or lung: Secondary | ICD-10-CM | POA: Diagnosis not present

## 2023-10-08 DIAGNOSIS — Z510 Encounter for antineoplastic radiation therapy: Secondary | ICD-10-CM | POA: Diagnosis not present

## 2023-10-08 LAB — RAD ONC ARIA SESSION SUMMARY
Course Elapsed Days: 12
Plan Fractions Treated to Date: 9
Plan Prescribed Dose Per Fraction: 3 Gy
Plan Total Fractions Prescribed: 10
Plan Total Prescribed Dose: 30 Gy
Reference Point Dosage Given to Date: 27 Gy
Reference Point Session Dosage Given: 3 Gy
Session Number: 9

## 2023-10-09 ENCOUNTER — Ambulatory Visit
Admission: RE | Admit: 2023-10-09 | Discharge: 2023-10-09 | Disposition: A | Discharge status: Home / Self Care | Source: Ambulatory Visit | Attending: Radiation Oncology | Admitting: Radiation Oncology

## 2023-10-09 ENCOUNTER — Other Ambulatory Visit: Payer: Self-pay

## 2023-10-09 ENCOUNTER — Telehealth: Payer: Self-pay | Admitting: Genetic Counselor

## 2023-10-09 ENCOUNTER — Ambulatory Visit (INDEPENDENT_AMBULATORY_CARE_PROVIDER_SITE_OTHER): Admitting: Otolaryngology

## 2023-10-09 ENCOUNTER — Encounter (INDEPENDENT_AMBULATORY_CARE_PROVIDER_SITE_OTHER): Payer: Self-pay | Admitting: Otolaryngology

## 2023-10-09 ENCOUNTER — Other Ambulatory Visit (HOSPITAL_COMMUNITY): Payer: Self-pay

## 2023-10-09 VITALS — BP 183/77 | HR 80

## 2023-10-09 DIAGNOSIS — C7951 Secondary malignant neoplasm of bone: Secondary | ICD-10-CM | POA: Diagnosis not present

## 2023-10-09 DIAGNOSIS — R490 Dysphonia: Secondary | ICD-10-CM

## 2023-10-09 DIAGNOSIS — J383 Other diseases of vocal cords: Secondary | ICD-10-CM

## 2023-10-09 DIAGNOSIS — J3801 Paralysis of vocal cords and larynx, unilateral: Secondary | ICD-10-CM

## 2023-10-09 DIAGNOSIS — K219 Gastro-esophageal reflux disease without esophagitis: Secondary | ICD-10-CM

## 2023-10-09 DIAGNOSIS — Z510 Encounter for antineoplastic radiation therapy: Secondary | ICD-10-CM | POA: Diagnosis not present

## 2023-10-09 DIAGNOSIS — R070 Pain in throat: Secondary | ICD-10-CM

## 2023-10-09 DIAGNOSIS — C3492 Malignant neoplasm of unspecified part of left bronchus or lung: Secondary | ICD-10-CM | POA: Diagnosis not present

## 2023-10-09 LAB — RAD ONC ARIA SESSION SUMMARY
Course Elapsed Days: 13
Plan Fractions Treated to Date: 10
Plan Prescribed Dose Per Fraction: 3 Gy
Plan Total Fractions Prescribed: 10
Plan Total Prescribed Dose: 30 Gy
Reference Point Dosage Given to Date: 30 Gy
Reference Point Session Dosage Given: 3 Gy
Session Number: 10

## 2023-10-09 NOTE — Progress Notes (Signed)
 ENT Progress Note:   Update 10/09/2023  Discussed the use of AI scribe software for clinical note transcription with the patient, who gave verbal consent to proceed.  History of Present Illness Chad Moreno "Chad Moreno" is an 83 year old male with Left vocal cord paralysis, hx of lung cancer, s/p Restylane injection augmentation 2 weeks ago, who presents for follow-up.  He has experienced significant improvement in his voice since the vocal cord filler injection. He no longer struggles for air, can complete sentences without stopping, and does not get tired easily. He describes his voice as sounding like 'a different person' and mentions that others have noticed the change as well.   He has a history of cancer in the right lung, which metastasized to the ninth rib. He recently completed ten radiation treatments for this rib, which has reduced his pain significantly, although some discomfort persists. He mentions that the radiation fatigue had subsided but has recently returned.   Records Reviewed:  Initial Evaluation  Reason for Consult: left vocal fold paralysis and dysphonia   HPI: Discussed the use of AI scribe software for clinical note transcription with the patient, who gave verbal consent to proceed.  History of Present Illness   Chad Moreno "Chad Moreno" is an 83 year old male with lung cancer and left vocal cord paralysis who presents with persistent dysphonia. He was referred by his oncologist for evaluation of vocal cord paralysis.  He has a history of left vocal cord paralysis, diagnosed approximately four to five months ago after a CT scan of the chest and neck. The paralysis is attributed to a lymph node pressing against recurrent laryngeal nerve. His voice is described as raspy and breathy, with a need to gasp for air and running out of air when talking.   He presents with recent onset of discomfort  when swallowing, noticed over the past week to week and a half. The discomfort is  intermittent, with no associated difficulty with swallowing. No coughing or choking occurs when drinking liquids or eating food, and he maintains a regular diet.  His lung cancer treatment included radiation and chemo, with the most recent radiation completed two weeks ago (had two rounds of XRT). He takes twelve pills daily, nine in the morning and three at night. No IV chemotherapy is currently administered.  He has prostate cancer, treated with 28 radiation sessions completed on June 6th. He receives hormone shots every six months, with the next one scheduled for February 26th.  A brain MRI was recently performed as part of his cancer workup. A neck scan in November coincided with the onset of voice changes. He also had CT chest after he was diagnosed with VF paralysis. No history of stroke is reported.     Records Reviewed:  Dr Marguerita Shih office visit 08/01/23   Recurrent/metastatic (T1c, N3, M1b) non-small cell lung cancer, adenocarcinoma initially diagnosed as early stage disease, stage I in March 2024 involving the right lower lobe and left upper lobe status post SBRT completed in May 2024.  The patient presented with recurrent and metastatic disease to bilateral supraclavicular, subpectoral, left axillary as well as upper abdominal lymphadenopathy and malignant left pleural effusion in December 2024 with confirmation of malignancy from the pleural effusion.      He is tolerating the first 2 weeks of his treatment fairly well except for the generalized fatigue and weakness.    Metastatic Lung Adenocarcinoma with BRAF V600E Mutation Diagnosed in December 2024, started targeted therapy on  July 22, 2023. Reports significant fatigue, shortness of breath, and low energy levels, likely due to cancer treatment and recent chest radiation. Throat pain attributed to radiation. No nausea, vomiting, or diarrhea. Recent MRI showed no intracranial metastatic disease. Side effects may improve in about a  month. Emphasized importance of monitoring and reporting new symptoms or side effects. - Continue targeted therapy - Use Carafate QID for throat pain - Monitor for side effects and treatment efficacy - Follow up with ENT for hoarseness - Monitor pleural effusion, consider chest x-ray if symptoms worsen - Recheck labs in two weeks   Radiation-Induced Esophagitis Throat pain localized to radiation-treated area, consistent with radiation-induced esophagitis causing painful swallowing. Prescribed Carafate for symptom management. - Use Carafate QID - Follow up with radiation oncologist Dr. Lorri Rota   Past Medical History:  Diagnosis Date   Age-related cataract of left eye    Amblyopia, right eye    Arthritis    Cancer (HCC)    Dysuria    ED (erectile dysfunction)    Elevated PSA    Epidermal cyst    Family history of prostate cancer    Gout    Hip pain    Histoplasmosis    Hyperlipidemia    Hypermetropia, left eye    Hypertension    Impaired glucose tolerance    Joint pain    Left shoulder pain    Lower back pain    Melanocytic nevi, unspecified    Nummular dermatitis    Ocular hypertension    Palmar fascial fibromatosis    Prostate cancer screening    Regular astigmatism, right eye    Senile nuclear sclerosis     Past Surgical History:  Procedure Laterality Date   BRONCHIAL BIOPSY  08/27/2022   Procedure: BRONCHIAL BIOPSIES;  Surgeon: Denson Flake, MD;  Location: [facility];  Service: Pulmonary;;   BRONCHIAL BRUSHINGS  08/27/2022   Procedure: BRONCHIAL BRUSHINGS;  Surgeon: Denson Flake, MD;  Location: [facility];  Service: Pulmonary;;   BRONCHIAL NEEDLE ASPIRATION BIOPSY  08/27/2022   Procedure: BRONCHIAL NEEDLE ASPIRATION BIOPSIES;  Surgeon: Denson Flake, MD;  Location: [facility];  Service: Pulmonary;;   CONSTRICTING FINGER RING EXCISION Left    CYST REMOVAL TRUNK Left    left posterior shoulder   FIDUCIAL MARKER PLACEMENT  08/27/2022   Procedure: FIDUCIAL  MARKER PLACEMENT;  Surgeon: Denson Flake, MD;  Location: [facility];  Service: Pulmonary;;   GOLD SEED IMPLANT N/A 10/02/2022   Procedure: GOLD SEED IMPLANT;  Surgeon: Mallie Seal, MD;  Location: [facility];  Service: Urology;  Laterality: N/A;   index finger Left    IR THORACENTESIS ASP PLEURAL SPACE W/IMG GUIDE  06/14/2023   Prostate needle biopsy     SPACE OAR INSTILLATION N/A 10/02/2022   Procedure: SPACE OAR INSTILLATION;  Surgeon: Mallie Seal, MD;  Location: [facility];  Service: Urology;  Laterality: N/A;    Family History  Problem Relation Age of Onset   Prostate cancer Father     Social History:  reports that he quit smoking about 44 years ago. His smoking use included cigarettes. He started smoking about 54 years ago. He has a 5 pack-year smoking history. He has never used smokeless tobacco. He reports current alcohol use. He reports that he does not use drugs.  Allergies:  Allergies  Allergen Reactions   Sulfa Antibiotics Anaphylaxis    Medications: I have reviewed the patient's current medications.  The PMH,  PSH, Medications, Allergies, and SH were reviewed and updated.  ROS: Constitutional: Negative for fever, weight loss and weight gain. Cardiovascular: Negative for chest pain and dyspnea on exertion. Respiratory: Is not experiencing shortness of breath at rest. Gastrointestinal: Negative for nausea and vomiting. Neurological: Negative for headaches. Psychiatric: The patient is not nervous/anxious  Blood pressure (!) 183/77, pulse 80, SpO2 96%.  PHYSICAL EXAM:  Exam: General: Well-developed, well-nourished Communication and Voice: clear good projection  Respiratory Respiratory effort: Equal inspiration and expiration without stridor Cardiovascular Peripheral Vascular: Warm extremities with equal color/perfusion Eyes: No nystagmus with equal extraocular motion bilaterally Neuro/Psych/Balance: Patient oriented to  person, place, and time; Appropriate mood and affect; Gait is intact with no imbalance; Cranial nerves I-XII are intact Head and Face Inspection: Normocephalic and atraumatic without mass or lesion Palpation: Facial skeleton intact without bony stepoffs Salivary Glands: No mass or tenderness Facial Strength: Facial motility symmetric and full bilaterally ENT Pinna: External ear intact and fully developed External canal: Canal is patent with intact skin Tympanic Membrane: Clear and mobile External Nose: No scar or anatomic deformity Internal Nose: Septum is deviated to the left. No polyp, or purulence. Mucosal edema and erythema present.  Bilateral inferior turbinate hypertrophy.  Lips, Teeth, and gums: Mucosa and teeth intact and viable TMJ: No pain to palpation with full mobility Oral cavity/oropharynx: No erythema or exudate, no lesions present Nasopharynx: No mass or lesion with intact mucosa Hypopharynx: Intact mucosa without pooling of secretions Larynx Glottic: B/l VF without lesion or mass, R VF paralysis, near complete glottic closure, VF edges appear straight Supraglottic: Normal appearing epiglottis and AE folds Interarytenoid Space: Moderate pachydermia&edema Subglottic Space: Patent without lesion or edema Neck Neck and Trachea: Midline trachea without mass or lesion Thyroid: No mass or nodularity Lymphatics: No lymphadenopathy  Procedure: Preoperative diagnosis: dysphonia  Postoperative diagnosis:   Same + L VF paralysis   Procedure: Flexible fiberoptic laryngoscopy  Surgeon: Ovidio Steele, MD  Anesthesia: Topical lidocaine and Afrin Complications: None Condition is stable throughout exam  Indications and consent:  The patient presents to the clinic with Indirect laryngoscopy view was incomplete. Thus it was recommended that they undergo a flexible fiberoptic laryngoscopy. All of the risks, benefits, and potential complications were reviewed with the patient  preoperatively and verbal informed consent was obtained.  Procedure: The patient was seated upright in the clinic. Topical lidocaine and Afrin were applied to the nasal cavity. After adequate anesthesia had occurred, I then proceeded to pass the flexible telescope into the nasal cavity. The nasal cavity was patent without rhinorrhea or polyp. The nasopharynx was also patent without mass or lesion. The base of tongue was visualized and was normal. There were no signs of pooling of secretions in the piriform sinuses. The true vocal folds had evidence of  L VF paralysis. There were no signs of glottic or supraglottic mucosal lesion or mass. There was moderate interarytenoid pachydermia and post cricoid edema. The telescope was then slowly withdrawn and the patient tolerated the procedure throughout.  Studies Reviewed: MRI brain 07/29/23 IMPRESSION: No acute intracranial process. No evidence of intracranial metastatic disease.  06/18/23 PET/CT.   IMPRESSION: 1. Clear progression nodal metastasis with new hypermetabolic LEFT axillary nodes, LEFT sub pectoralis lymph nodes and bilateral hypermetabolic supraclavicular lymph nodes. 2. Moderate metabolic activity associated with the treatment sites in LEFT upper lobe and RIGHT lower lobe with masslike densities. Indeterminate findings for post therapy change versus residual carcinoma. 3. New small nodal metastasis in the upper abdomen adjacent  to the SMA and celiac trunk. 4. Decreased metabolic activity of sclerotic skeletal lesions in pelvis.  CT neck 05/10/23 CLINICAL DATA:  Left vocal cord paresis. Hoarseness. Symptoms of 10 weeks duration. History of prostate and lung cancer.   EXAM: CT NECK WITH CONTRAST   TECHNIQUE: Multidetector CT imaging of the neck was performed using the standard protocol following the bolus administration of intravenous contrast.   RADIATION DOSE REDUCTION: This exam was performed according to the departmental  dose-optimization program which includes automated exposure control, adjustment of the mA and/or kV according to patient size and/or use of iterative reconstruction technique.   CONTRAST:  75mL ISOVUE-300 IOPAMIDOL (ISOVUE-300) INJECTION 61%   COMPARISON:  Chest CT 04/02/2023   FINDINGS: Pharynx and larynx: No evidence of mucosal or submucosal mass lesion or inflammatory change. Patient does show findings of vocal fold paresis or paralysis on the left. Vocal fold is closer to midline. Laryngeal ventricle is dilated on the left.   Salivary glands: Parotid and submandibular glands are normal.   Thyroid: Normal   Lymph nodes: On the right, there is an enlarged level 4/level 5 node measuring 15 x 11 mm axial image 100. Indistinct tissue in the right supraclavicular fossa in the region of axial image 106 is also worrisome for metastatic disease. I think these findings are progressive since October.   On the left, there are no enlarged cervical chain nodes. There are small but enlarging supraclavicular lymph nodes on the left worrisome for early evidence of metastatic disease. I do not see a lesion along the course of the recurrent laryngeal nerve on the left. Further enlargement of previously noted abnormal left axillary lymph nodes.   Vascular: Atherosclerotic calcification of the carotid bifurcations but no likely flow limiting stenosis. Detailed exam not performed.   Limited intracranial: No abnormality seen.   Visualized orbits: Normal   Mastoids and visualized paranasal sinuses: Clear   Skeleton: Ordinary cervical spondylosis.   Upper chest: Enlarging effusion on the left layering dependently with dependent atelectasis. Post treatment densities in the left upper lobe and right midlung remain visible and are not primarily or completely evaluated.   Other: None   IMPRESSION: 1. No evidence of mucosal or submucosal mass lesion or inflammatory change in the neck.  Patient does show findings of vocal fold paresis or paralysis on the left. 2. Enlarging level 4/level 5 node on the right measuring 15 x 11 mm. Indistinct tissue in the right supraclavicular fossa in the region of axial worrisome for metastatic disease. I think these findings are progressive since October. 3. Small but enlarging supraclavicular lymph nodes on the left worrisome for early evidence of metastatic disease in that region. 4. Further enlargement of previously noted abnormal left axillary lymph nodes. 5. Enlarging effusion on the left layering dependently with dependent atelectasis. Post treatment densities in the left upper lobe and right midlung remain visible and are not primarily or completely evaluated.  CT chest 04/02/23 IMPRESSION: Status post SBRT with radiation changes in the left upper lobe and superior segment right lower lobe.   Mildly progressive left axillary node, suspicious. Isolated nodal metastasis/recurrence is possible. Attention on follow-up is suggested.   Moderate left pleural effusion, progressive.   Aortic Atherosclerosis (ICD10-I70.0).     Assessment/Plan: Encounter Diagnoses  Name Primary?   Paralysis of left vocal fold Yes   Vocal fold atrophy    Glottic insufficiency    Dysphonia    Chronic GERD    Throat discomfort  Assessment and Plan    Chronic dysphonia and Left Vocal Cord Paralysis x 4-5 months  Left vocal cord paralysis could be 2/2 recurrent laryngeal nerve compression by a lymph node/nodal mets. Examination confirmed left vocal cord paralysis and age-related VF atrophy/glottic insufficiency. Slightly asymmetric epiglottis noted, potentially complicating glottic exposure/view. Discussed in-office VF injection augmentation with local anesthesia, which would last 6-12 months, possibly requiring 2 rounds of filler injection. Informed that 5% of patients may need general anesthesia, 2/2 poor procedure tolerance. Patient prefers  general anesthesia but agreed to attempt local anesthesia first 2/2 having a hard time recovering from general anesthesia in the past.  - Schedule b/l VF injection augmentation for early March  - Prescribed Xanax for pre-procedure anxiety - Provide procedural instructions and information - to review at home  Throat discomfort with swallowing - per Oncology notes, likely radiation-induced esophagitis, since sx localize to radiation-treated area - continue Carafate and see Dr Lorri Rota, Rad Onc as scheduled  - will consider EGD in the future if sx will not improve   Chronic GERD LPR - trial of Reflux Gourmet - diet and lifestyle changes to minimize GERD  Metastatic Lung Cancer - Continue current therapy and f/u with Oncology  Prostate Cancer Prostate cancer treated with 28 radiation sessions, completed June 6th. Receives hormone shots biannually, next scheduled for February 26th. - Continue hormone therapy as scheduled - Follow up with urologist on February 24th  Follow-up - Book injection augmentation for early March - will need pre-auth     Update 10/09/2023 Assessment and Plan Assessment & Plan Vocal cord paralysis, Dysphonia, VF atrophy and glottic insufficiency Significant improvement in voice and breathing after Restylane injection augmentation. Good vocal cord closure on flexible scope exam. Improvement expected to last nine months. Discussed permanent implant option considering age and cancer treatment. Discussed repeat injection augmentation once the filler will get resorbed - Schedule follow-up in nine months to assess a need for repeat injection. - Advised rescheduling sooner if voice quality declines before nine months. - Discussed potential for permanent implant if he desires a long-term solution, considering overall health and cancer treatment status.  Metastatic lung cancer Metastatic lung cancer with rib metastasis. Completed radiation, reducing pain. Radiation effects  to continue for two to three weeks. Experiences radiation fatigue.  - Continue to f/u with Oncology as scheduled   Artice Last, MD Otolaryngology [facility] Phone: [phone] Fax: [phone]    10/09/2023, 5:22 PM

## 2023-10-09 NOTE — Telephone Encounter (Signed)
 We messaged Chad Moreno on 10/07/2023 to let him know Chad Moreno is recommended to have genetic testing due to his personal history of metastatic prostate cancer.   Chad Kozloski, MS, [facility] Genetic Counselor [email or web address] (P) [phone]

## 2023-10-09 NOTE — Progress Notes (Signed)
 Specialty Pharmacy Refill Coordination Note  Chad Moreno is a 83 y.o. male contacted today regarding refills of specialty medication(s) Braftovi & Mektovi.  Patient requested (Patient-Rptd) Delivery   Delivery date: (Patient-Rptd) 10/16/23   Verified address: (Patient-Rptd) [address] [place], [place], [place] [address]   Medication will be filled on 10/15/23.

## 2023-10-10 NOTE — Radiation Completion Notes (Addendum)
  Radiation Oncology         [phone] ________________________________  Name: Chad Moreno MRN: [id number]  Date: 10/09/2023  DOB: 09-03-1940  Date of Birth: 1941/01/04 Referring Physician: Lonzie Robins, M.D. Date of Service: 2023-10-10 Radiation Oncologist: Bartholome Ligas, M.D. [facility]     RADIATION ONCOLOGY END OF TREATMENT NOTE     Diagnosis:  83 yo man with a painful bony metastasis in the right 9th rib secondary to recurrent metastatic lung cancer (T1c, N3, M1b).   Intent: Palliative     ==========DELIVERED PLANS==========  First Treatment Date: 2023-09-26 Last Treatment Date: 2023-10-09   Plan Name: [id number] Site: Ribs, Right Technique: 3D Mode: Photon Dose Per Fraction: 3 Gy Prescribed Dose (Delivered / Prescribed): 30 Gy / 30 Gy Prescribed Fxs (Delivered / Prescribed): 10 / 10     ==========ON TREATMENT VISIT DATES========== 2023-09-26, 2023-10-04   See weekly On Treatment Notes in Epic for details in the Media tab (listed as Progress notes on the On Treatment Visit Dates listed above).  He tolerated the treatments well with only modest fatigue.  The patient will receive a call in about one month from the radiation oncology department. He will continue follow up with his medical oncologist, Dr. Marguerita Shih, as well.  ------------------------------------------------   [name], MD [facility]  Radiation Oncology Direct Dial: [phone]  Fax: [phone] [email or web address]  Skype  LinkedIn

## 2023-10-15 ENCOUNTER — Inpatient Hospital Stay: Attending: Internal Medicine

## 2023-10-15 ENCOUNTER — Inpatient Hospital Stay (HOSPITAL_BASED_OUTPATIENT_CLINIC_OR_DEPARTMENT_OTHER): Admitting: Internal Medicine

## 2023-10-15 ENCOUNTER — Other Ambulatory Visit: Payer: Self-pay

## 2023-10-15 VITALS — BP 149/74 | HR 76 | Temp 97.6°F | Resp 17 | Ht 72.0 in | Wt 212.3 lb

## 2023-10-15 DIAGNOSIS — C3431 Malignant neoplasm of lower lobe, right bronchus or lung: Secondary | ICD-10-CM | POA: Insufficient documentation

## 2023-10-15 DIAGNOSIS — C3492 Malignant neoplasm of unspecified part of left bronchus or lung: Secondary | ICD-10-CM

## 2023-10-15 DIAGNOSIS — C778 Secondary and unspecified malignant neoplasm of lymph nodes of multiple regions: Secondary | ICD-10-CM | POA: Diagnosis not present

## 2023-10-15 DIAGNOSIS — Z1509 Genetic susceptibility to other malignant neoplasm: Secondary | ICD-10-CM | POA: Diagnosis not present

## 2023-10-15 DIAGNOSIS — J910 Malignant pleural effusion: Secondary | ICD-10-CM | POA: Insufficient documentation

## 2023-10-15 DIAGNOSIS — D649 Anemia, unspecified: Secondary | ICD-10-CM | POA: Diagnosis not present

## 2023-10-15 LAB — CMP (CANCER CENTER ONLY)
ALT: 21 U/L (ref 0–44)
AST: 19 U/L (ref 15–41)
Albumin: 3.8 g/dL (ref 3.5–5.0)
Alkaline Phosphatase: 52 U/L (ref 38–126)
Anion gap: 5 (ref 5–15)
BUN: 20 mg/dL (ref 8–23)
CO2: 28 mmol/L (ref 22–32)
Calcium: 8.8 mg/dL — ABNORMAL LOW (ref 8.9–10.3)
Chloride: 108 mmol/L (ref 98–111)
Creatinine: 1.02 mg/dL (ref 0.61–1.24)
GFR, Estimated: >60 mL/min (ref >60)
Glucose, Bld: 115 mg/dL — ABNORMAL HIGH (ref 70–99)
Potassium: 5 mmol/L (ref 3.5–5.1)
Sodium: 141 mmol/L (ref 135–145)
Total Bilirubin: 0.4 mg/dL (ref 0.0–1.2)
Total Protein: 6.2 g/dL — ABNORMAL LOW (ref 6.5–8.1)

## 2023-10-15 LAB — CBC WITH DIFFERENTIAL (CANCER CENTER ONLY)
Abs Immature Granulocytes: 0.02 10*3/uL (ref 0.00–0.07)
Basophils Absolute: 0 10*3/uL (ref 0.0–0.1)
Basophils Relative: 1 %
Eosinophils Absolute: 0.1 10*3/uL (ref 0.0–0.5)
Eosinophils Relative: 1 %
HCT: 30.3 % — ABNORMAL LOW (ref 39.0–52.0)
Hemoglobin: 10.4 g/dL — ABNORMAL LOW (ref 13.0–17.0)
Immature Granulocytes: 1 %
Lymphocytes Relative: 10 %
Lymphs Abs: 0.4 10*3/uL — ABNORMAL LOW (ref 0.7–4.0)
MCH: 30.2 pg (ref 26.0–34.0)
MCHC: 34.3 g/dL (ref 30.0–36.0)
MCV: 88.1 fL (ref 80.0–100.0)
Monocytes Absolute: 0.5 10*3/uL (ref 0.1–1.0)
Monocytes Relative: 13 %
Neutro Abs: 3.3 10*3/uL (ref 1.7–7.7)
Neutrophils Relative %: 74 %
Platelet Count: 115 10*3/uL — ABNORMAL LOW (ref 150–400)
RBC: 3.44 MIL/uL — ABNORMAL LOW (ref 4.22–5.81)
RDW: 13.2 % (ref 11.5–15.5)
WBC Count: 4.3 10*3/uL (ref 4.0–10.5)
nRBC: 0 % (ref 0.0–0.2)

## 2023-10-15 NOTE — Progress Notes (Signed)
 [facility] [phone]   [phone]  OFFICE PROGRESS NOTE  Chad Robins, MD [address] [place] [address]  DIAGNOSIS:  Recurrent/metastatic (T1c, N3, M1b) non-small cell lung cancer, adenocarcinoma initially diagnosed as early stage disease, stage I in March 2024 involving the right lower lobe and left upper lobe status post SBRT completed in May 2024.  The patient presented with recurrent and metastatic disease to bilateral supraclavicular, subpectoral, left axillary as well as upper abdominal lymphadenopathy and malignant left pleural effusion in December 2024 with confirmation of malignancy from the pleural effusion.   Biomarker Findings HRD signature - Cannot Be Determined Microsatellite status - Cannot Be Determined ? Tumor Mutational Burden - Cannot Be Determined Genomic Findings For a complete list of the genes assayed, please refer to the Appendix. BRAF V600E CHEK2 R474C CTNNB1 S45C DNMT3A W330* PPARG E3K 7 Disease relevant genes with no reportable alterations: ALK, EGFR, ERBB2, KRAS, MET, RET, ROS1  PDL1 TPS 1%  PRIOR THERAPY: None  CURRENT THERAPY:  Encorafenib  450 mg p.o. daily and binimetinib  45 mg p.o. twice daily.  First dose July 22, 2023.  INTERVAL HISTORY: Chad Moreno 83 y.o. male returns to the clinic today for follow-up visit accompanied by his wife.Discussed the use of AI scribe software for clinical note transcription with the patient, who gave verbal consent to proceed.  History of Present Illness   [name] "Chad Moreno" is an 83 year old male with metastatic non-small cell lung cancer who presents for evaluation and repeat blood work.  He was diagnosed with metastatic non-small cell lung cancer, adenocarcinoma subtype, in December 2024, with a positive BRAF V600E mutation and PD-L1 expression of 1%. He is currently undergoing treatment with encorafenib  450 mg daily and binimetinib  45 mg, twice  daily which was started on July 22, 2023.  He experiences intermittent sharp pain on the right side of his chest, described as 'like you're throwing a dart,' lasting only a few seconds. Additionally, he feels soreness in the rib area on the right side. He takes oxycodone  7.5 mg at night, which helps him sleep, although he wakes up two to three times per night to use the bathroom.  His wife notes increased fatigue, particularly in the mornings, and difficulty breathing after activities such as showering, requiring him to sit down to catch his breath. He has been dealing with anemia, which contributes to his fatigue.       MEDICAL HISTORY: Past Medical History:  Diagnosis Date   Age-related cataract of left eye    Amblyopia, right eye    Arthritis    Cancer (HCC)    Dysuria    ED (erectile dysfunction)    Elevated PSA    Epidermal cyst    Family history of prostate cancer    Gout    Hip pain    Histoplasmosis    Hyperlipidemia    Hypermetropia, left eye    Hypertension    Impaired glucose tolerance    Joint pain    Left shoulder pain    Lower back pain    Melanocytic nevi, unspecified    Nummular dermatitis    Ocular hypertension    Palmar fascial fibromatosis    Prostate cancer screening    Regular astigmatism, right eye    Senile nuclear sclerosis     ALLERGIES:  is allergic to sulfa antibiotics.  MEDICATIONS:  Current Outpatient Medications  Medication Sig Dispense Refill   amLODipine (NORVASC) 2.5 MG  tablet Take 2.5 mg by mouth in the morning.     Ascorbic Acid (VITAMIN C PO) Take 1 tablet by mouth daily with breakfast.     aspirin EC 81 MG tablet Take 81 mg by mouth in the morning.     B Complex Vitamins (VITAMIN-B COMPLEX) TABS Take 1 tablet by mouth daily with breakfast.     binimetinib  (MEKTOVI ) 15 MG tablet Take 3 tablets (45 mg total) by mouth 2 (two) times daily. 180 tablet 3   calcium carbonate (OSCAL) 1500 (600 Ca) MG TABS tablet Take 1,200 mg by mouth 2  (two) times daily with a meal.     Cholecalciferol (VITAMIN D3) 50 MCG (2000 UT) TABS Take 2,000 Units by mouth daily with breakfast.     cyanocobalamin (VITAMIN B12) 1000 MCG tablet Take 1,000 mcg by mouth daily.     encorafenib  (BRAFTOVI ) 75 MG capsule Take 6 capsules (450 mg total) by mouth daily. 180 capsule 3   fluticasone (FLONASE) 50 MCG/ACT nasal spray Place 2 sprays into both nostrils daily.     losartan (COZAAR) 100 MG tablet Take 50 mg by mouth 2 (two) times daily.     megestrol  (MEGACE ) 20 MG tablet Take 1 tablet (20 mg total) by mouth daily. 30 tablet 2   meloxicam (MOBIC) 15 MG tablet Take 15 mg by mouth in the morning.     naproxen sodium (ALEVE) 220 MG tablet Take 220 mg by mouth daily as needed (soreness with golfing).     Omega-3 Fatty Acids (FISH OIL PO) Take 1 capsule by mouth daily with breakfast.     oxyCODONE -acetaminophen  (ENDOCET) 7.5-325 MG tablet Take 1-2 tablets by mouth every 8 (eight) hours as needed for severe pain (pain score 7-10) (Stage IV Cancer Pain). 45 tablet 0   POTASSIUM PO Take 1 tablet by mouth daily with breakfast.     Probiotic Product (PROBIOTIC PEARLS ADVANTAGE PO) Take 1 capsule by mouth in the morning.     silodosin  (RAPAFLO ) 8 MG CAPS capsule Take 8 mg by mouth daily.     sucralfate  (CARAFATE ) 1 g tablet Take 1 tablet (1 g total) by mouth 4 (four) times daily. Dissolve each tablet in 15 cc water before use. 120 tablet 1   No current facility-administered medications for this visit.    SURGICAL HISTORY:  Past Surgical History:  Procedure Laterality Date   BRONCHIAL BIOPSY  08/27/2022   Procedure: BRONCHIAL BIOPSIES;  Surgeon: Denson Flake, MD;  Location: [facility] ENDOSCOPY;  Service: Pulmonary;;   BRONCHIAL BRUSHINGS  08/27/2022   Procedure: BRONCHIAL BRUSHINGS;  Surgeon: Denson Flake, MD;  Location: [facility] ENDOSCOPY;  Service: Pulmonary;;   BRONCHIAL NEEDLE ASPIRATION BIOPSY  08/27/2022   Procedure: BRONCHIAL NEEDLE ASPIRATION BIOPSIES;  Surgeon:  Denson Flake, MD;  Location: [facility] ENDOSCOPY;  Service: Pulmonary;;   CONSTRICTING FINGER RING EXCISION Left    CYST REMOVAL TRUNK Left    left posterior shoulder   FIDUCIAL MARKER PLACEMENT  08/27/2022   Procedure: FIDUCIAL MARKER PLACEMENT;  Surgeon: Denson Flake, MD;  Location: [facility] ENDOSCOPY;  Service: Pulmonary;;   GOLD SEED IMPLANT N/A 10/02/2022   Procedure: GOLD SEED IMPLANT;  Surgeon: Mallie Seal, MD;  Location: [facility];  Service: Urology;  Laterality: N/A;   index finger Left    IR THORACENTESIS ASP PLEURAL SPACE W/IMG GUIDE  06/14/2023   Prostate needle biopsy     SPACE OAR INSTILLATION N/A 10/02/2022   Procedure: SPACE OAR INSTILLATION;  Surgeon: Mallie Seal, MD;  Location: [facility];  Service: Urology;  Laterality: N/A;    REVIEW OF SYSTEMS:  A comprehensive review of systems was negative except for: Constitutional: positive for fatigue Respiratory: positive for pleurisy/chest pain   PHYSICAL EXAMINATION: General appearance: alert, cooperative, fatigued, and no distress Head: Normocephalic, without obvious abnormality, atraumatic Neck: no adenopathy, no JVD, supple, symmetrical, trachea midline, and thyroid  not enlarged, symmetric, no tenderness/mass/nodules Lymph nodes: Cervical, supraclavicular, and axillary nodes normal. Resp: clear to auscultation bilaterally Back: symmetric, no curvature. ROM normal. No CVA tenderness. Cardio: regular rate and rhythm, S1, S2 normal, no murmur, click, rub or gallop GI: soft, non-tender; bowel sounds normal; no masses,  no organomegaly Extremities: extremities normal, atraumatic, no cyanosis or edema  ECOG PERFORMANCE STATUS: 1 - Symptomatic but completely ambulatory  Blood pressure (!) 149/74, pulse 76, temperature 97.6 F (36.4 C), temperature source Temporal, resp. rate 17, height 6' (1.829 m), weight 212 lb 4.8 oz (96.3 kg), SpO2 100%.  LABORATORY DATA: Lab Results  Component Value Date    WBC 4.3 10/15/2023   HGB 10.4 (L) 10/15/2023   HCT 30.3 (L) 10/15/2023   MCV 88.1 10/15/2023   PLT 115 (L) 10/15/2023      Chemistry      Component Value Date/Time   NA 141 09/12/2023 0914   K 4.7 09/12/2023 0914   CL 109 09/12/2023 0914   CO2 26 09/12/2023 0914   BUN 19 09/12/2023 0914   CREATININE 0.94 09/12/2023 0914      Component Value Date/Time   CALCIUM 8.6 (L) 09/12/2023 0914   ALKPHOS 56 09/12/2023 0914   AST 19 09/12/2023 0914   ALT 17 09/12/2023 0914   BILITOT 0.4 09/12/2023 0914       RADIOGRAPHIC STUDIES: No results found.   ASSESSMENT AND PLAN: This is a very pleasant 84 years old white male with Recurrent/metastatic (T1c, N3, M1b) non-small cell lung cancer, adenocarcinoma initially diagnosed as early stage disease, stage I in March 2024 involving the right lower lobe and left upper lobe status post SBRT completed in May 2024.  The patient presented with recurrent and metastatic disease to bilateral supraclavicular, subpectoral, left axillary as well as upper abdominal lymphadenopathy and malignant left pleural effusion in December 2024 with confirmation of malignancy from the pleural effusion. Molecular studies by foundation 1 showed positive BRAF V600E mutation and PD-L1 expression of 1%. The patient is currently on treatment with encorafenib  450 mg p.o. twice daily in addition to binimetinib  45 mg milligram p.o. twice daily.  First dose was July 22, 2023. The patient has been tolerating this treatment fairly well.    Metastatic non-small cell lung cancer Metastatic non-small cell lung cancer, adenocarcinoma, with positive BRAF V600E mutation and PD-L1 expression of 1%. Currently undergoing treatment with encorafenib  and binimetinib . Reports intermittent sharp, dart-like pain on the right side of the chest lasting a few seconds, and soreness in the rib area. Experiences fatigue and dyspnea, particularly in the morning, likely due to cancer and anemia.  Oxygen saturation is 100%, not qualifying for home oxygen. Blood work remains acceptable. - Continue encorafenib  450 mg daily. - Continue binimetinib  45 mg twice daily. - Schedule a scan in two months. - Re-evaluate in one month.  Anemia Anemia contributing to fatigue and dyspnea. Blood work remains acceptable.   He was advised to call immediately if he has any concerning symptoms in the interval. The patient voices understanding of current disease status and treatment options and  is in agreement with the current care plan.  All questions were answered. The patient knows to call the clinic with any problems, questions or concerns. We can certainly see the patient much sooner if necessary.  The total time spent in the appointment was 20 minutes.  Disclaimer: This note was dictated with voice recognition software. Similar sounding words can inadvertently be transcribed and may not be corrected upon review.

## 2023-11-05 ENCOUNTER — Other Ambulatory Visit: Payer: Self-pay | Admitting: Internal Medicine

## 2023-11-05 MED ORDER — OXYCODONE-ACETAMINOPHEN 5-325 MG PO TABS: 1.0000 | ORAL_TABLET | Freq: Three times a day (TID) | ORAL | 0 refills | Status: DC | PRN

## 2023-11-06 ENCOUNTER — Other Ambulatory Visit: Payer: Self-pay

## 2023-11-06 ENCOUNTER — Other Ambulatory Visit: Payer: Self-pay | Admitting: Internal Medicine

## 2023-11-06 MED ORDER — BRAFTOVI 75 MG PO CAPS
450.0000 mg | ORAL_CAPSULE | Freq: Every day | ORAL | 3 refills | Status: DC
  Filled 2023-11-06: qty 180, 30d supply, fill #0
  Filled 2023-12-06: qty 180, 30d supply, fill #1

## 2023-11-06 MED ORDER — MEKTOVI 15 MG PO TABS
45.0000 mg | ORAL_TABLET | Freq: Two times a day (BID) | ORAL | 3 refills | Status: DC
  Filled 2023-11-06: qty 180, 30d supply, fill #0
  Filled 2023-12-06: qty 180, 30d supply, fill #1

## 2023-11-06 NOTE — Progress Notes (Signed)
 Specialty Pharmacy Ongoing Clinical Assessment Note  Chad Moreno is a 83 y.o. male who is being followed by the specialty pharmacy service for RxSp Oncology   Patient's specialty medication(s) reviewed today: Binimetinib  (MEKTOVI ); Encorafenib  (BRAFTOVI )   Missed doses in the last 4 weeks: 0   Patient/Caregiver did not have any additional questions or concerns.   Therapeutic benefit summary: Patient is achieving benefit   Adverse events/side effects summary: No adverse events/side effects   Patient's therapy is appropriate to: Continue    Goals Addressed             This Visit's Progress    Slow Disease Progression       Patient is on track. Patient will maintain adherence. Updated scans due late June.          Follow up: 3 months  Rudolf Blizard M Jenisa Monty Specialty Pharmacist

## 2023-11-06 NOTE — Progress Notes (Signed)
 Specialty Pharmacy Refill Coordination Note  Chad Moreno is a 83 y.o. male contacted today regarding refills of specialty medication(s) Binimetinib  (MEKTOVI ); Encorafenib  (BRAFTOVI )   Patient requested Delivery   Delivery date: 11/13/23   Verified address: [address] [place], [place] [address]   Medication will be filled on 11/12/23.

## 2023-11-07 ENCOUNTER — Other Ambulatory Visit (HOSPITAL_COMMUNITY): Payer: Self-pay

## 2023-11-08 ENCOUNTER — Other Ambulatory Visit: Payer: Self-pay | Admitting: Medical Oncology

## 2023-11-08 DIAGNOSIS — C3492 Malignant neoplasm of unspecified part of left bronchus or lung: Secondary | ICD-10-CM

## 2023-11-08 NOTE — Progress Notes (Signed)
palliative

## 2023-11-08 NOTE — Progress Notes (Addendum)
  Radiation Oncology         [phone] ________________________________  Name: Chad Moreno MRN: [id number]  Date of Service: 11/08/2023  DOB: Dec 03, 1940  Post Treatment Telephone Note  Diagnosis:  C34.92 Malignant neoplasm of unspecified part of left bronchus or lung (as documented in provider EOT note)  Site: Ribs, Right   The patient was available for call today.  The patient did note fatigue during radiation. The patient did not note skin changes in the field of radiation during therapy. The patient has noticed mild improvement in pain in the area(s) treated with radiation. The patient states "He is NOT taking dexamethasone . The patient does not have symptoms of  weakness or loss of control of the extremities. The patient does not have symptoms of headache. The patient does not have symptoms of seizure or uncontrolled movement. The patient does not have symptoms of changes in vision. The patient does not have changes in speech but struggles w/ the strength of his voice, but is improving, due to a prior vocal cord procedure. The patient does not have confusion.   The patient is scheduled for ongoing care with Dr. Marguerita Shih in medical oncology. The patient was encouraged to call if he  develops concerns or questions regarding radiation.   This concludes the interaction.  Avery Bodo, LPN

## 2023-11-12 ENCOUNTER — Other Ambulatory Visit (HOSPITAL_COMMUNITY): Payer: Self-pay

## 2023-11-12 ENCOUNTER — Other Ambulatory Visit: Payer: Self-pay

## 2023-11-12 ENCOUNTER — Ambulatory Visit
Admission: RE | Admit: 2023-11-12 | Discharge: 2023-11-12 | Disposition: A | Discharge status: Home / Self Care | Source: Ambulatory Visit | Attending: Internal Medicine | Admitting: Internal Medicine

## 2023-11-12 DIAGNOSIS — C3492 Malignant neoplasm of unspecified part of left bronchus or lung: Secondary | ICD-10-CM | POA: Insufficient documentation

## 2023-11-12 DIAGNOSIS — C7951 Secondary malignant neoplasm of bone: Secondary | ICD-10-CM | POA: Insufficient documentation

## 2023-11-13 ENCOUNTER — Telehealth: Payer: Self-pay | Admitting: Internal Medicine

## 2023-11-13 NOTE — Telephone Encounter (Signed)
 Left a voicemail with appointment changes due to limited availability. Moved patient up by 15 minutes.

## 2023-11-15 NOTE — Progress Notes (Signed)
 Pt left a VM for me on 5/22 stating that someone from scheduling had called him, but he couldn't hear the message very well. I called the pt, but call went to VM. I left a message for the pt stating that his lab appt was moved up to 2:30 from 2:45 and his MD appt from 3:15 to 3:00pm on 5/28. I left my desk number and asked that he call me if he had any questions.

## 2023-11-15 NOTE — Progress Notes (Unsigned)
 [facility] OFFICE PROGRESS NOTE  Lonzie Robins, MD [address] [place] [address]  DIAGNOSIS: Recurrent/metastatic (T1c, N3, M1b) non-small cell lung cancer, adenocarcinoma initially diagnosed as early stage disease, stage I in March 2024 involving the right lower lobe and left upper lobe status post SBRT completed in May 2024.  The patient presented with recurrent and metastatic disease to bilateral supraclavicular, subpectoral, left axillary as well as upper abdominal lymphadenopathy and malignant left pleural effusion in December 2024 with confirmation of malignancy from the pleural effusion.    Biomarker Findings HRD signature - Cannot Be Determined Microsatellite status - Cannot Be Determined ? Tumor Mutational Burden - Cannot Be Determined Genomic Findings For a complete list of the genes assayed, please refer to the Appendix. BRAF V600E CHEK2 R474C CTNNB1 S45C DNMT3A W330* PPARG E3K 7 Disease relevant genes with no reportable alterations: ALK, EGFR, ERBB2, KRAS, MET, RET, ROS1   PDL1 TPS 1%  PRIOR THERAPY:  1) UHRT to the AP window lymphadenopathy under the care of Dr. Lorri Rota, most recent dose on 07/19/23  2) Palliative radiation to the right chest, last dose on 10/09/23.   CURRENT THERAPY: Encorafenib  450 mg p.o. twice daily and binimetinib  45 mg p.o. twice daily. First dose July 22, 2023.   INTERVAL HISTORY: NKOSI CORTRIGHT 83 y.o. male returns to the clinic today for a follow-up visit. The patient is accompanied by wife today. The patient was last seen in the clinic by Dr. Marguerita Shih on 10/15/23. He is currently undergoing treatment with oral treatment with Encorafenib  and binimetinib . This was started in January 2025.   Overall, he is feeling fairly well.   At is last appointment, he was endorsing intermittent sharp pain on the right side of his chest/posterior right 9th rib. He experiences persistent rib pain that is inadequately controlled with  his current medication regimen of oxycodone  5 mg, taken twice daily. Despite this, his pain level remains at a 5 out of 10 after medication, and without it, the pain escalates to an 8 or 9, sometimes reaching 10. He has previously undergone ten radiation treatments for the rib pain without much improvement. He inquired about seeing palliative care as this was mentioned at a prior appointment per patient report.   He experiences shortness of breath, particularly in the mornings upon waking. No wheezing and no diagnosis of COPD. He is a former smoker, having quit 44 years ago. He has not undergone pulmonary function tests or been evaluated for sleep apnea. He previously had a left pleural effusion requiring thoracentesis. He did not have any known symptoms with his pleural effusion. He denies associated cough.   He occasionally experiences night sweats, which are unpredictable and sporadic. No fevers or chills.  No significant swelling in his legs, rashes, or coughing. He occasionally experiences constipation, which is managed with Miralax. No nausea, vomiting, or diarrhea.  He has a history of anemia and takes B12 supplements twice daily. He is awaiting results from iron studies to determine if there are any deficiencies contributing to his anemia.   He is here today for evaluation and repeat blood work.    MEDICAL HISTORY: Past Medical History:  Diagnosis Date   Age-related cataract of left eye    Amblyopia, right eye    Arthritis    Cancer (HCC)    Dysuria    ED (erectile dysfunction)    Elevated PSA    Epidermal cyst    Family history of prostate cancer  Gout    Hip pain    Histoplasmosis    Hyperlipidemia    Hypermetropia, left eye    Hypertension    Impaired glucose tolerance    Joint pain    Left shoulder pain    Lower back pain    Melanocytic nevi, unspecified    Nummular dermatitis    Ocular hypertension    Palmar fascial fibromatosis    Prostate cancer screening     Regular astigmatism, right eye    Senile nuclear sclerosis     ALLERGIES:  is allergic to sulfa antibiotics.  MEDICATIONS:  Current Outpatient Medications  Medication Sig Dispense Refill   amLODipine (NORVASC) 2.5 MG tablet Take 2.5 mg by mouth in the morning.     Ascorbic Acid (VITAMIN C PO) Take 1 tablet by mouth daily with breakfast.     aspirin EC 81 MG tablet Take 81 mg by mouth in the morning.     B Complex Vitamins (VITAMIN-B COMPLEX) TABS Take 1 tablet by mouth daily with breakfast.     binimetinib  (MEKTOVI ) 15 MG tablet Take 3 tablets (45 mg total) by mouth 2 (two) times daily. 180 tablet 3   calcium carbonate (OSCAL) 1500 (600 Ca) MG TABS tablet Take 1,200 mg by mouth 2 (two) times daily with a meal.     Cholecalciferol (VITAMIN D3) 50 MCG (2000 UT) TABS Take 2,000 Units by mouth daily with breakfast.     cyanocobalamin (VITAMIN B12) 1000 MCG tablet Take 1,000 mcg by mouth daily.     encorafenib  (BRAFTOVI ) 75 MG capsule Take 6 capsules (450 mg total) by mouth daily. 180 capsule 3   fluticasone (FLONASE) 50 MCG/ACT nasal spray Place 2 sprays into both nostrils daily.     losartan (COZAAR) 100 MG tablet Take 50 mg by mouth 2 (two) times daily.     megestrol  (MEGACE ) 20 MG tablet Take 1 tablet (20 mg total) by mouth daily. 30 tablet 2   meloxicam (MOBIC) 15 MG tablet Take 15 mg by mouth in the morning.     naproxen sodium (ALEVE) 220 MG tablet Take 220 mg by mouth daily as needed (soreness with golfing).     Omega-3 Fatty Acids (FISH OIL PO) Take 1 capsule by mouth daily with breakfast.     oxyCODONE -acetaminophen  (ENDOCET) 5-325 MG tablet Take 1 tablet by mouth every 8 (eight) hours as needed for severe pain (pain score 7-10) (Stage IV Cancer Pain). 30 tablet 0   POTASSIUM PO Take 1 tablet by mouth daily with breakfast.     Probiotic Product (PROBIOTIC PEARLS ADVANTAGE PO) Take 1 capsule by mouth in the morning.     silodosin  (RAPAFLO ) 8 MG CAPS capsule Take 8 mg by mouth daily.      sucralfate  (CARAFATE ) 1 g tablet Take 1 tablet (1 g total) by mouth 4 (four) times daily. Dissolve each tablet in 15 cc water before use. 120 tablet 1   No current facility-administered medications for this visit.    SURGICAL HISTORY:  Past Surgical History:  Procedure Laterality Date   BRONCHIAL BIOPSY  08/27/2022   Procedure: BRONCHIAL BIOPSIES;  Surgeon: Denson Flake, MD;  Location: [facility] ENDOSCOPY;  Service: Pulmonary;;   BRONCHIAL BRUSHINGS  08/27/2022   Procedure: BRONCHIAL BRUSHINGS;  Surgeon: Denson Flake, MD;  Location: [facility] ENDOSCOPY;  Service: Pulmonary;;   BRONCHIAL NEEDLE ASPIRATION BIOPSY  08/27/2022   Procedure: BRONCHIAL NEEDLE ASPIRATION BIOPSIES;  Surgeon: Denson Flake, MD;  Location: [facility] ENDOSCOPY;  Service:  Pulmonary;;   CONSTRICTING FINGER RING EXCISION Left    CYST REMOVAL TRUNK Left    left posterior shoulder   FIDUCIAL MARKER PLACEMENT  08/27/2022   Procedure: FIDUCIAL MARKER PLACEMENT;  Surgeon: Denson Flake, MD;  Location[facility] ENDOSCOPY;  Service: Pulmonary;;   GOLD SEED IMPLANT N/A 10/02/2022   Procedure: GOLD SEED IMPLANT;  Surgeon: Mallie Seal, MD;  Location: [facility];  Service: Urology;  Laterality: N/A;   index finger Left    IR THORACENTESIS ASP PLEURAL SPACE W/IMG GUIDE  06/14/2023   Prostate needle biopsy     SPACE OAR INSTILLATION N/A 10/02/2022   Procedure: SPACE OAR INSTILLATION;  Surgeon: Mallie Seal, MD;  Location: [facility];  Service: Urology;  Laterality: N/A;    REVIEW OF SYSTEMS:   Review of Systems  Constitutional: Stable fatigue. Negative for appetite change, chills, fever and unexpected weight change.  HENT: Negative for mouth sores, nosebleeds, sore throat and trouble swallowing.   Eyes: Negative for eye problems and icterus.  Respiratory: intermittent shortness of breath. Negative for cough, hemoptysis,  and wheezing.   Cardiovascular: Positive for posterior right rib pain. Negative for leg  swelling.  Gastrointestinal: Mild intermittent constipation. Negative for abdominal pain, diarrhea, nausea and vomiting.  Genitourinary: Negative for bladder incontinence, difficulty urinating, dysuria, frequency and hematuria.   Musculoskeletal: Negative for back pain, gait problem, neck pain and neck stiffness.  Skin: Negative for itching and rash.  Neurological: Negative for dizziness, extremity weakness, gait problem, headaches, light-headedness and seizures.  Hematological: Negative for adenopathy. Does not bruise/bleed easily.  Psychiatric/Behavioral: Negative for confusion, depression and sleep disturbance. The patient is not nervous/anxious.     PHYSICAL EXAMINATION:  There were no vitals taken for this visit.  ECOG PERFORMANCE STATUS: 1  Physical Exam  Constitutional: Oriented to person, place, and time and well-developed, well-nourished, and in no distress.  HENT:  Head: Normocephalic and atraumatic.  Mouth/Throat: Oropharynx is clear and moist. No oropharyngeal exudate.  Eyes: Conjunctivae are normal. Right eye exhibits no discharge. Left eye exhibits no discharge. No scleral icterus.  Neck: Normal range of motion. Neck supple.  Cardiovascular: Normal rate, regular rhythm, normal heart sounds and intact distal pulses.   Pulmonary/Chest: Effort normal and breath sounds normal. No respiratory distress. No wheezes. No rales.  Abdominal: Soft. Bowel sounds are normal. Exhibits no distension and no mass. There is no tenderness.  Musculoskeletal: Normal range of motion. Exhibits no edema.  Lymphadenopathy:    No cervical adenopathy.  Neurological: Alert and oriented to person, place, and time. Exhibits normal muscle tone. Gait normal. Coordination normal.  Skin: Skin is warm and dry. No rash noted. Not diaphoretic. No erythema. No pallor.  Psychiatric: Mood, memory and judgment normal.  Vitals reviewed.  LABORATORY DATA: Lab Results  Component Value Date   WBC 4.3 10/15/2023    HGB 10.4 (L) 10/15/2023   HCT 30.3 (L) 10/15/2023   MCV 88.1 10/15/2023   PLT 115 (L) 10/15/2023      Chemistry      Component Value Date/Time   NA 141 10/15/2023 1034   K 5.0 10/15/2023 1034   CL 108 10/15/2023 1034   CO2 28 10/15/2023 1034   BUN 20 10/15/2023 1034   CREATININE 1.02 10/15/2023 1034      Component Value Date/Time   CALCIUM 8.8 (L) 10/15/2023 1034   ALKPHOS 52 10/15/2023 1034   AST 19 10/15/2023 1034   ALT 21 10/15/2023 1034   BILITOT  0.4 10/15/2023 1034       RADIOGRAPHIC STUDIES:  No results found.   ASSESSMENT/PLAN:  This is a very pleasant 83 year old Caucasian male with recurrent/metastatic (T1c, N3, M1b) non-small cell lung cancer, adenocarcinoma.  He was initially diagnosed with early stage stage I in March 2024 which involve the right lower lobe and left lower lobe.  He is status post SBRT which was completed in May 2024.   More recently the patient presented with recurrent and metastatic disease to bilateral supraclavicular, subpectoral, left axillary, and upper abdominal lymphadenopathy and malignant left pleural effusion in December 2024.  Cytology was positive for malignant cells from his pleural effusion.   Molecular studies by foundation 1 showed positive BRAF V600 E mutation.  His PD-L1 expression is 1%.   He completed UHRT to the lymphadenopathy under the care of Dr. Lorri Rota in January 2025.  He also received palliative radiation to the right chest which was completed on 10/09/23   He is currently on treatment with encorafenib  450 mg p.o. twice daily in addition to binimetinib  45 mg milligram p.o. twice daily.  First dose was July 22, 2023.    Labs were reviewed.  Iron studies are pending at this time.  I will contact him if he needs to take an iron supplement.  We will arrange for restaging CT scan of his chest, abdomen, and pelvis prior to seeing us  back for follow-up visit next month.  No significant pleural effusion suspected  based on exam today.  The patient states that his pain is not always well-controlled.  His pain is primarily in the posterior right ninth rib lesion that he previously underwent palliative radiation to without significant improvement.  He is wondering if he can establish care with palliative care.  I will place the referral.  I also have increased his oxycodone  to 7.5 mg in addition to Tylenol .  He is advised that he can take additional Tylenol  as long as he is conscientious not to exceed the daily dose of Tylenol  since his Percocet contains the equivalent of 1 Tylenol .   Anemia Hemoglobin slightly lower than previous visit. Not at transfusion level. Iron studies pending. B12 supplementation ongoing. - Check iron studies for deficiency. - Continue B12 supplementation.  I will arrange for repeat CT scan in 4 weeks.  We will see him back a few days later to review the scan results.  The patient was advised to call immediately if she has any concerning symptoms in the interval. The patient voices understanding of current disease status and treatment options and is in agreement with the current care plan. All questions were answered. The patient knows to call the clinic with any problems, questions or concerns. We can certainly see the patient much sooner if necessary   No orders of the defined types were placed in this encounter.    The total time spent in the appointment was 20-29 minutes  Jahaan Vanwagner L Gillie Crisci, PA-C 11/15/23

## 2023-11-20 ENCOUNTER — Inpatient Hospital Stay (HOSPITAL_BASED_OUTPATIENT_CLINIC_OR_DEPARTMENT_OTHER): Admitting: Physician Assistant

## 2023-11-20 ENCOUNTER — Ambulatory Visit: Admitting: Internal Medicine

## 2023-11-20 ENCOUNTER — Inpatient Hospital Stay: Attending: Internal Medicine

## 2023-11-20 ENCOUNTER — Other Ambulatory Visit

## 2023-11-20 VITALS — BP 168/81 | HR 79 | Temp 97.3°F | Resp 18 | Wt 212.7 lb

## 2023-11-20 DIAGNOSIS — C3492 Malignant neoplasm of unspecified part of left bronchus or lung: Secondary | ICD-10-CM

## 2023-11-20 DIAGNOSIS — C778 Secondary and unspecified malignant neoplasm of lymph nodes of multiple regions: Secondary | ICD-10-CM | POA: Diagnosis not present

## 2023-11-20 DIAGNOSIS — G893 Neoplasm related pain (acute) (chronic): Secondary | ICD-10-CM | POA: Diagnosis not present

## 2023-11-20 DIAGNOSIS — Z87891 Personal history of nicotine dependence: Secondary | ICD-10-CM | POA: Insufficient documentation

## 2023-11-20 DIAGNOSIS — C3431 Malignant neoplasm of lower lobe, right bronchus or lung: Secondary | ICD-10-CM | POA: Insufficient documentation

## 2023-11-20 DIAGNOSIS — D649 Anemia, unspecified: Secondary | ICD-10-CM | POA: Diagnosis not present

## 2023-11-20 DIAGNOSIS — J910 Malignant pleural effusion: Secondary | ICD-10-CM | POA: Diagnosis not present

## 2023-11-20 LAB — CMP (CANCER CENTER ONLY)
ALT: 16 U/L (ref 0–44)
AST: 20 U/L (ref 15–41)
Albumin: 3.8 g/dL (ref 3.5–5.0)
Alkaline Phosphatase: 55 U/L (ref 38–126)
Anion gap: 5 (ref 5–15)
BUN: 32 mg/dL — ABNORMAL HIGH (ref 8–23)
CO2: 27 mmol/L (ref 22–32)
Calcium: 9.1 mg/dL (ref 8.9–10.3)
Chloride: 105 mmol/L (ref 98–111)
Creatinine: 0.98 mg/dL (ref 0.61–1.24)
GFR, Estimated: >60 mL/min (ref >60)
Glucose, Bld: 85 mg/dL (ref 70–99)
Potassium: 4.7 mmol/L (ref 3.5–5.1)
Sodium: 137 mmol/L (ref 135–145)
Total Bilirubin: 0.3 mg/dL (ref 0.0–1.2)
Total Protein: 6.6 g/dL (ref 6.5–8.1)

## 2023-11-20 LAB — CBC WITH DIFFERENTIAL (CANCER CENTER ONLY)
Abs Immature Granulocytes: 0.03 10*3/uL (ref 0.00–0.07)
Basophils Absolute: 0 10*3/uL (ref 0.0–0.1)
Basophils Relative: 1 %
Eosinophils Absolute: 0.1 10*3/uL (ref 0.0–0.5)
Eosinophils Relative: 2 %
HCT: 29.3 % — ABNORMAL LOW (ref 39.0–52.0)
Hemoglobin: 9.9 g/dL — ABNORMAL LOW (ref 13.0–17.0)
Immature Granulocytes: 1 %
Lymphocytes Relative: 13 %
Lymphs Abs: 0.6 10*3/uL — ABNORMAL LOW (ref 0.7–4.0)
MCH: 29 pg (ref 26.0–34.0)
MCHC: 33.8 g/dL (ref 30.0–36.0)
MCV: 85.9 fL (ref 80.0–100.0)
Monocytes Absolute: 0.5 10*3/uL (ref 0.1–1.0)
Monocytes Relative: 12 %
Neutro Abs: 3.3 10*3/uL (ref 1.7–7.7)
Neutrophils Relative %: 71 %
Platelet Count: 125 10*3/uL — ABNORMAL LOW (ref 150–400)
RBC: 3.41 MIL/uL — ABNORMAL LOW (ref 4.22–5.81)
RDW: 13.4 % (ref 11.5–15.5)
WBC Count: 4.7 10*3/uL (ref 4.0–10.5)
nRBC: 0 % (ref 0.0–0.2)

## 2023-11-20 LAB — FERRITIN: Ferritin: 153 ng/mL (ref 24–336)

## 2023-11-20 LAB — IRON AND IRON BINDING CAPACITY (CC-WL,HP ONLY)
Iron: 38 ug/dL — ABNORMAL LOW (ref 45–182)
Saturation Ratios: 13 % — ABNORMAL LOW (ref 17.9–39.5)
TIBC: 295 ug/dL (ref 250–450)
UIBC: 257 ug/dL (ref 117–376)

## 2023-11-20 MED ORDER — OXYCODONE-ACETAMINOPHEN 7.5-325 MG PO TABS: 1.0000 | ORAL_TABLET | Freq: Four times a day (QID) | ORAL | 0 refills | Status: DC | PRN

## 2023-11-22 ENCOUNTER — Encounter: Payer: Self-pay | Admitting: Physician Assistant

## 2023-11-25 DIAGNOSIS — R490 Dysphonia: Secondary | ICD-10-CM | POA: Diagnosis not present

## 2023-11-25 DIAGNOSIS — D649 Anemia, unspecified: Secondary | ICD-10-CM | POA: Diagnosis not present

## 2023-11-25 DIAGNOSIS — I10 Essential (primary) hypertension: Secondary | ICD-10-CM | POA: Diagnosis not present

## 2023-11-25 DIAGNOSIS — C3490 Malignant neoplasm of unspecified part of unspecified bronchus or lung: Secondary | ICD-10-CM | POA: Diagnosis not present

## 2023-11-25 DIAGNOSIS — H5440 Blindness, one eye, unspecified eye: Secondary | ICD-10-CM | POA: Diagnosis not present

## 2023-11-25 DIAGNOSIS — R600 Localized edema: Secondary | ICD-10-CM | POA: Diagnosis not present

## 2023-11-25 DIAGNOSIS — F4329 Adjustment disorder with other symptoms: Secondary | ICD-10-CM | POA: Diagnosis not present

## 2023-11-25 DIAGNOSIS — E669 Obesity, unspecified: Secondary | ICD-10-CM | POA: Diagnosis not present

## 2023-11-25 DIAGNOSIS — R7302 Impaired glucose tolerance (oral): Secondary | ICD-10-CM | POA: Diagnosis not present

## 2023-11-25 DIAGNOSIS — C61 Malignant neoplasm of prostate: Secondary | ICD-10-CM | POA: Diagnosis not present

## 2023-11-25 DIAGNOSIS — E785 Hyperlipidemia, unspecified: Secondary | ICD-10-CM | POA: Diagnosis not present

## 2023-11-25 DIAGNOSIS — M1990 Unspecified osteoarthritis, unspecified site: Secondary | ICD-10-CM | POA: Diagnosis not present

## 2023-11-27 ENCOUNTER — Telehealth: Payer: Self-pay

## 2023-11-27 NOTE — Telephone Encounter (Signed)
 I scheduled Chad Moreno for his Palliative appointment on 6/10 at 8:30am. [name] is aware to arrive by 8:15am.

## 2023-12-03 ENCOUNTER — Inpatient Hospital Stay: Attending: Internal Medicine | Admitting: Nurse Practitioner

## 2023-12-03 ENCOUNTER — Inpatient Hospital Stay: Admitting: Nurse Practitioner

## 2023-12-03 ENCOUNTER — Encounter: Payer: Self-pay | Admitting: Nurse Practitioner

## 2023-12-03 VITALS — BP 132/71 | HR 84 | Temp 97.6°F | Resp 16 | Ht 72.0 in | Wt 210.1 lb

## 2023-12-03 DIAGNOSIS — Z87891 Personal history of nicotine dependence: Secondary | ICD-10-CM | POA: Diagnosis not present

## 2023-12-03 DIAGNOSIS — C7951 Secondary malignant neoplasm of bone: Secondary | ICD-10-CM | POA: Diagnosis not present

## 2023-12-03 DIAGNOSIS — C3431 Malignant neoplasm of lower lobe, right bronchus or lung: Secondary | ICD-10-CM | POA: Insufficient documentation

## 2023-12-03 DIAGNOSIS — Z7189 Other specified counseling: Secondary | ICD-10-CM | POA: Diagnosis not present

## 2023-12-03 DIAGNOSIS — G893 Neoplasm related pain (acute) (chronic): Secondary | ICD-10-CM | POA: Diagnosis not present

## 2023-12-03 DIAGNOSIS — C3492 Malignant neoplasm of unspecified part of left bronchus or lung: Secondary | ICD-10-CM

## 2023-12-03 DIAGNOSIS — Z515 Encounter for palliative care: Secondary | ICD-10-CM | POA: Diagnosis not present

## 2023-12-03 MED ORDER — KETOROLAC TROMETHAMINE 30 MG/ML IJ SOLN
30.0000 mg | Freq: Once | INTRAMUSCULAR | Status: AC
  Administered 2023-12-03: 30 mg via INTRAMUSCULAR
  Filled 2023-12-03: qty 1

## 2023-12-03 MED ORDER — FERROUS SULFATE 325 (65 FE) MG PO TBEC: 325.0000 mg | DELAYED_RELEASE_TABLET | Freq: Three times a day (TID) | ORAL | Status: DC

## 2023-12-03 MED ORDER — CELECOXIB 200 MG PO CAPS: 200.0000 mg | ORAL_CAPSULE | Freq: Two times a day (BID) | ORAL | 2 refills | Status: DC

## 2023-12-03 MED ORDER — FERROUS SULFATE 325 (65 FE) MG PO TBEC: 325.0000 mg | DELAYED_RELEASE_TABLET | Freq: Every day | ORAL | Status: DC | PRN

## 2023-12-03 NOTE — Patient Instructions (Signed)
 Ketorolac  Injection What is this medication? KETOROLAC  (kee toe ROLE ak) treats short-term moderate to severe pain. It works by decreasing inflammation. It belongs to a group of medications called NSAIDs. This medicine may be used for other purposes; ask your health care provider or pharmacist if you have questions. COMMON BRAND NAME(S): Toradol  What should I tell my care team before I take this medication? They need to know if you have any of these conditions: Bleeding disorder Coronary artery bypass graft (CABG) within the past 2 weeks Frequently drink alcohol  Heart attack Heart disease Heart failure High blood pressure Kidney disease Liver disease Lung or breathing disease, such as asthma or COPD Receiving steroids, such as dexamethasone  or prednisone Stomach bleeding Stomach ulcers, other stomach or intestine problems Take medication to treat or prevent blood clots Tobacco use An unusual or allergic reaction to ketorolac , other medications, foods, dyes, or preservatives Pregnant or trying to get pregnant Breastfeeding How should I use this medication? This medication is injected into a vein or muscle. It is given by your care team in a hospital or clinic setting. A special MedGuide will be given to you before each treatment. Be sure to read this information carefully each time. Talk to your care team about the use of this medication in children. Special care may be needed. People 65 years and older may have a stronger reaction and need a smaller dose. Overdosage: If you think you have taken too much of this medicine contact a poison control center or emergency room at once. NOTE: This medicine is only for you. Do not share this medicine with others. What if I miss a dose? This does not apply. This medication is not for regular use. What may interact with this medication? Do not take this medication with any of the following: Aspirin and aspirin-like  medications Cidofovir Methotrexate NSAIDs, medications for pain and inflammation, such as ibuprofen or naproxen Pentoxifylline Probenecid This medication may also interact with the following: Alcohol  Alendronate Alprazolam  Carbamazepine Diuretics Flavocoxid Fluoxetine Ginkgo Lithium Medications for blood pressure, such as enalapril Medications that affect platelets, such as pentoxifylline Medications that prevent or treat blood clots, such as heparin or warfarin Medications that relax muscles Pemetrexed Phenytoin Thiothixene This list may not describe all possible interactions. Give your health care provider a list of all the medicines, herbs, non-prescription drugs, or dietary supplements you use. Also tell them if you smoke, drink alcohol , or use illegal drugs. Some items may interact with your medicine. What should I watch for while using this medication? Your condition will be monitored carefully while you are receiving this medication. Do not use this medication for more than 5 days. It is only used for short-term treatment of moderate to severe pain. The risk of side effects such as kidney damage and stomach bleeding are higher if used for more than 5 days. Do not take other medications that contain aspirin, ibuprofen, or naproxen with this medication. Side effects such as stomach upset, nausea, or ulcers may be more likely to occur. Many non-prescription medications contain aspirin, ibuprofen, or naproxen. Always read labels carefully. This medication can cause serious ulcers and bleeding in the stomach. It can happen with no warning. Tobacco, alcohol , older age, and poor health can also increase risks. Call your care team right away if you have stomach pain or blood in your vomit or stool. This medication may cause serious skin reactions. They can happen weeks to months after starting the medication. Contact your care team right away  if you notice fevers or flu-like symptoms with  a rash. The rash may be red or purple and then turn into blisters or peeling of the skin. You may also notice a red rash with swelling of the face, lips, or lymph nodes in your neck or under your arms. Talk to your care team if you wish to become pregnant or think you might be pregnant. This medication can cause serious birth defects. This medication does not prevent a heart attack or stroke. This medication may increase the chance of a heart attack or stroke. The chance may increase the longer you use this medication or if you have heart disease. If you take aspirin to prevent a heart attack or stroke, talk to your care team about using this medication. This medication may affect your coordination, reaction time, or judgment. Do not drive or operate machinery until you know how this medication affects you. Sit up or stand slowly to reduce the risk of dizzy or fainting spells. Drinking alcohol  with this medication can increase the risk of these side effects. What side effects may I notice from receiving this medication? Side effects that you should report to your care team as soon as possible: Allergic reactions--skin rash, itching, hives, swelling of the face, lips, tongue, or throat Bleeding--bloody or black, tar-like stools, vomiting blood or brown material that looks like coffee grounds, red or dark brown urine, small red or purple spots on skin, unusual bruising or bleeding Heart attack--pain or tightness in the chest, shoulders, arms, or jaw, nausea, shortness of breath, cold or clammy skin, feeling faint or lightheaded Heart failure--shortness of breath, swelling of the ankles, feet, or hands, sudden weight gain, unusual weakness or fatigue Increase in blood pressure Kidney injury--decrease in the amount of urine, swelling of the ankles, hands, or feet Liver injury--right upper belly pain, loss of appetite, nausea, light-colored stool, dark yellow or brown urine, yellowing skin or eyes, unusual  weakness or fatigue Rash, fever, and swollen lymph nodes Redness, blistering, peeling, or loosening of the skin, including inside the mouth Round red or dark patches on the skin that may itch, burn, and blister Stroke--sudden numbness or weakness of the face, arm, or leg, trouble speaking, confusion, trouble walking, loss of balance or coordination, dizziness, severe headache, change in vision Side effects that usually do not require medical attention (report these to your care team if they continue or are bothersome): Headache Loss of appetite Nausea Upset stomach This list may not describe all possible side effects. Call your doctor for medical advice about side effects. You may report side effects to FDA at 1-800-FDA-1088. Where should I keep my medication? This medication is given in a hospital or clinic. It will not be stored at home. NOTE: This sheet is a summary. It may not cover all possible information. If you have questions about this medicine, talk to your doctor, pharmacist, or health care provider.  2025 Elsevier/Gold Standard (2023-06-14 00:00:00)

## 2023-12-03 NOTE — Progress Notes (Signed)
 Pt presented to clinic today for palliative appointment with rib/back pain. Pt given IM toradol  per orders (see MAR) tolerated well d/c to lobby with spouse.

## 2023-12-03 NOTE — Progress Notes (Signed)
 Palliative Medicine [facility]  [phone] [phone]   Name: Chad Moreno Date: 12/03/2023 MRN: [id number]  DOB: Apr 22, 1941  Patient Care Team: Lonzie Robins, MD as PCP - General (Internal Medicine) Devon Fogo, MD (Inactive) as Consulting Physician (Dermatology) Katheleen Palmer, RN as Oncology Nurse Navigator Bouchard, Lorilee Rooks, RN as Oncology Nurse Navigator    REASON FOR CONSULTATION: Chad Moreno is a 83 y.o. male with oncologic medical history including recurrent metastatic non-small cell lung cancer initially diagnosed in March 2024 s/p SBRT.  Now with metastatic disease including left axillary, bilateral supraclavicular, abdominal lymphadenopathy, and malignant pleural effusion (05/2023) s/p palliative radiation to right chest completed April 2025.  Patient currently on Encorafenib  and binimetinib .  Palliative is seeing patient for symptom management and goals of care.    SOCIAL HISTORY:     reports that he quit smoking about 44 years ago. His smoking use included cigarettes. He started smoking about 54 years ago. He has a 5 pack-year smoking history. He has never used smokeless tobacco. He reports current alcohol  use. He reports that he does not use drugs.  ADVANCE DIRECTIVES:  Patient to bring in documents to be scanned on file.  He reports his wife Chad Moreno is his Museum/gallery exhibitions officer.  CODE STATUS: DNR  PAST MEDICAL HISTORY: Past Medical History:  Diagnosis Date   Age-related cataract of left eye    Amblyopia, right eye    Arthritis    Cancer (HCC)    Dysuria    ED (erectile dysfunction)    Elevated PSA    Epidermal cyst    Family history of prostate cancer    Gout    Hip pain    Histoplasmosis    Hyperlipidemia    Hypermetropia, left eye    Hypertension    Impaired glucose tolerance    Joint pain    Left shoulder pain    Lower back pain    Melanocytic nevi, unspecified    Nummular dermatitis     Ocular hypertension    Palmar fascial fibromatosis    Prostate cancer screening    Regular astigmatism, right eye    Senile nuclear sclerosis     PAST SURGICAL HISTORY:  Past Surgical History:  Procedure Laterality Date   BRONCHIAL BIOPSY  08/27/2022   Procedure: BRONCHIAL BIOPSIES;  Surgeon: Denson Flake, MD;  Location: [facility];  Service: Pulmonary;;   BRONCHIAL BRUSHINGS  08/27/2022   Procedure: BRONCHIAL BRUSHINGS;  Surgeon: Denson Flake, MD;  Location: [facility];  Service: Pulmonary;;   BRONCHIAL NEEDLE ASPIRATION BIOPSY  08/27/2022   Procedure: BRONCHIAL NEEDLE ASPIRATION BIOPSIES;  Surgeon: Denson Flake, MD;  Location: [facility];  Service: Pulmonary;;   CONSTRICTING FINGER RING EXCISION Left    CYST REMOVAL TRUNK Left    left posterior shoulder   FIDUCIAL MARKER PLACEMENT  08/27/2022   Procedure: FIDUCIAL MARKER PLACEMENT;  Surgeon: Denson Flake, MD;  Location: [facility];  Service: Pulmonary;;   GOLD SEED IMPLANT N/A 10/02/2022   Procedure: GOLD SEED IMPLANT;  Surgeon: Mallie Seal, MD;  Location: [facility];  Service: Urology;  Laterality: N/A;   index finger Left    IR THORACENTESIS ASP PLEURAL SPACE W/IMG GUIDE  06/14/2023   Prostate needle biopsy     SPACE OAR INSTILLATION N/A 10/02/2022   Procedure: SPACE OAR INSTILLATION;  Surgeon: Mallie Seal, MD;  Location: [facility];  Service: Urology;  Laterality: N/A;  HEMATOLOGY/ONCOLOGY HISTORY:  Oncology History  Malignant neoplasm of prostate (HCC)  07/05/2022 Cancer Staging   Staging form: Prostate, AJCC 8th Edition - Clinical stage from 07/05/2022: Stage IVB (cT1c, cN0, cM1b, PSA: 5.2, Grade Group: 3) - Signed by Keitha Pata, PA-C on 07/31/2022 Histopathologic type: Adenocarcinoma, NOS Stage prefix: Initial diagnosis Prostate specific antigen (PSA) range: Less than 10 Gleason primary pattern: 4 Gleason secondary pattern: 3 Gleason score: 7 Histologic grading  system: 5 grade system Number of biopsy cores examined: 21 Number of biopsy cores positive: 7 Location of positive needle core biopsies: Both sides   07/31/2022 Initial Diagnosis   Malignant neoplasm of prostate (HCC)   Non-small cell cancer of left lung (HCC)  09/05/2022 Initial Diagnosis   Non-small cell cancer of left lung (HCC)   07/15/2023 Cancer Staging   Staging form: Lung, AJCC 8th Edition - Clinical: Stage IVA (cT1c, cN3, cM1b) - Signed by Marlene Simas, MD on 07/15/2023     ALLERGIES:  is allergic to sulfa antibiotics.  MEDICATIONS:  Current Outpatient Medications  Medication Sig Dispense Refill   amLODipine (NORVASC) 2.5 MG tablet Take 2.5 mg by mouth in the morning.     Ascorbic Acid (VITAMIN C PO) Take 1 tablet by mouth daily with breakfast.     aspirin EC 81 MG tablet Take 81 mg by mouth in the morning.     B Complex Vitamins (VITAMIN-B COMPLEX) TABS Take 1 tablet by mouth daily with breakfast.     binimetinib  (MEKTOVI ) 15 MG tablet Take 3 tablets (45 mg total) by mouth 2 (two) times daily. 180 tablet 3   calcium carbonate (OSCAL) 1500 (600 Ca) MG TABS tablet Take 1,200 mg by mouth 2 (two) times daily with a meal.     celecoxib (CELEBREX) 200 MG capsule Take 1 capsule (200 mg total) by mouth 2 (two) times daily. 60 capsule 2   Cholecalciferol (VITAMIN D3) 50 MCG (2000 UT) TABS Take 2,000 Units by mouth daily with breakfast.     cyanocobalamin (VITAMIN B12) 1000 MCG tablet Take 1,000 mcg by mouth daily.     encorafenib  (BRAFTOVI ) 75 MG capsule Take 6 capsules (450 mg total) by mouth daily. 180 capsule 3   fluticasone (FLONASE) 50 MCG/ACT nasal spray Place 2 sprays into both nostrils daily.     losartan (COZAAR) 100 MG tablet Take 50 mg by mouth daily.     Omega-3 Fatty Acids (FISH OIL PO) Take 1 capsule by mouth daily with breakfast.     oxyCODONE -acetaminophen  (PERCOCET) 7.5-325 MG tablet Take 1 tablet by mouth every 6 (six) hours as needed for severe pain (pain score  7-10). 30 tablet 0   POTASSIUM PO Take 1 tablet by mouth daily with breakfast.     Probiotic Product (PROBIOTIC PEARLS ADVANTAGE PO) Take 1 capsule by mouth in the morning.     silodosin  (RAPAFLO ) 8 MG CAPS capsule Take 8 mg by mouth daily.     ferrous sulfate 325 (65 FE) MG EC tablet Take 1 tablet (325 mg total) by mouth daily as needed.     No current facility-administered medications for this visit.    VITAL SIGNS: BP 132/71 (BP Location: Right Arm, Patient Position: Sitting)   Pulse 84   Temp 97.6 F (36.4 C) (Temporal)   Resp 16   Ht 6' (1.829 m)   Wt 210 lb 2 oz (95.3 kg)   SpO2 100%   BMI 28.50 kg/m  Filed Weights   12/03/23 0823  Weight: 210 lb  2 oz (95.3 kg)    Estimated body mass index is 28.5 kg/m as calculated from the following:   Height as of this encounter: 6' (1.829 m).   Weight as of this encounter: 210 lb 2 oz (95.3 kg).  LABS: CBC:    Component Value Date/Time   WBC 4.7 11/20/2023 1434   WBC 8.3 10/17/2022 1907   HGB 9.9 (L) 11/20/2023 1434   HCT 29.3 (L) 11/20/2023 1434   PLT 125 (L) 11/20/2023 1434   MCV 85.9 11/20/2023 1434   NEUTROABS 3.3 11/20/2023 1434   LYMPHSABS 0.6 (L) 11/20/2023 1434   MONOABS 0.5 11/20/2023 1434   EOSABS 0.1 11/20/2023 1434   BASOSABS 0.0 11/20/2023 1434   Comprehensive Metabolic Panel:    Component Value Date/Time   NA 137 11/20/2023 1434   K 4.7 11/20/2023 1434   CL 105 11/20/2023 1434   CO2 27 11/20/2023 1434   BUN 32 (H) 11/20/2023 1434   CREATININE 0.98 11/20/2023 1434   GLUCOSE 85 11/20/2023 1434   CALCIUM 9.1 11/20/2023 1434   AST 20 11/20/2023 1434   ALT 16 11/20/2023 1434   ALKPHOS 55 11/20/2023 1434   BILITOT 0.3 11/20/2023 1434   PROT 6.6 11/20/2023 1434   ALBUMIN 3.8 11/20/2023 1434    RADIOGRAPHIC STUDIES: No results found.  PERFORMANCE STATUS (ECOG) : 1 - Symptomatic but completely ambulatory  Review of Systems  Constitutional:  Positive for activity change, appetite change and fatigue.   Musculoskeletal:  Positive for back pain.  Unless otherwise noted, a complete review of systems is negative.  Physical Exam General: NAD Cardiovascular: regular rate and rhythm Pulmonary: clear ant fields Abdomen: soft, nontender, + bowel sounds Extremities: no edema, no joint deformities Skin: no rashes Neurological: Alert and oriented x3  IMPRESSION: Discussed the use of AI scribe software for clinical note transcription with the patient, who gave verbal consent to proceed.  History of Present Illness Chad Moreno "Chad Moreno" is an 83 year old male with metastatic cancer who presents to the clinic for his initial palliative visit.  Patient has complaints of  severe right rib pain. He is accompanied by his wife, Chad Moreno.  He is alert and able to engage appropriately in discussions.  I introduced myself, Maygan RN, and Palliative's role in collaboration with the oncology team. Concept of Palliative Care was introduced as specialized medical care for people and their families living with serious illness.  It focuses on providing relief from the symptoms and stress of a serious illness.  The goal is to improve quality of life for both the patient and the family. Values and goals of care important to patient and family were attempted to be elicited.   Patient lives in a house with his wife of more than 32 years.  He is from eating.  They have 2 children and grandchildren. 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 Enjoys golfing however have not been able to do so for over a year due to his cancer.   Chad Moreno shares health was stable until about a year and a half ago when his PSA levels increased, leading to a diagnosis of prostate cancer. His PSA had been stable at 1.2-1.4 but rose to 3.6. A urologist performed biopsies, revealing a Gleason score of 7. Subsequent scans showed cancerous lobes in the lower right and upper left lungs, and the cancer  had spread to the spine. He underwent three intensive radiation treatments for  the lung lesions, but later learned that cancer had spread to the lymph nodes and ninth rib, causing significant pain. He has a history of smoking for about ten years but quit 43 years ago. His family history includes both parents having had cancer.   He has experienced a paralyzed left vocal cord due to a lymph node pressing against a nerve, which was treated with a filler procedure to improve his voice. He continues to monitor his PSA levels and undergoes regular scans to assess the progression of his cancer  He experiences a metallic taste affecting his appetite, which he manages by consuming fruits like watermelon and cantaloupe. His weight has been relatively stable, fluctuating slightly but generally around 205-210 pounds. We discussed ways to increase appetite as patient states he pushes himself to eat only because he knows he needs to. Education provided on the use of ginger and lemon to neutralize the taste.   He describes the rib pain as 'shooting darts' and 'very unbearable,' primarily located at the ninth rib, with radiation to the front and back of the rib area. He experiences significant discomfort, especially at night, requiring oxycodone  7.5 mg for relief. He also uses lidocaine  patches and frankincense for pain management. Despite these measures, the pain can be intense and sometimes takes his breath away. Patient also has been on meloxicam for several years for arthritic pain. Education provided on use of Celebrex twice daily and discontinuing use of Mobic. Patient in agreement as his primary concern is gaining some relief. We discussed other pain interventions which included intercostal nerve block. Patient and wife aware this will require evaluation to determine if procedure is appropriate. He verbalized understanding and appreciation.   Goals of care We discussed his current illness and what it means in the  larger context of his on-going co-morbidities. Natural disease trajectory and expectations were discussed. Chad Moreno and his wife are realistic in their understanding of his metastatic cancer and plan of care.   I empathetically approach discussions regarding advanced directives, CODE STATUS, and healthcare limitations.  Although patient does not have an advanced directive on file he states he does have a completed document.  Encouraged patient to bring in to allow for filing.  Patient states his wife Chad Moreno is his medical decision-maker in the event he is unable to speak for himself.  His desires for natural death with no life-sustaining measures.  DNR/DNI.  No artificial feedings long-term.  Patient and wife are clear in her expressed wishes to continue to treat the treatable allow him every opportunity to continue to thrive.  His quality of life is most important to him.  Will like his symptoms to be managed to minimize discomfort.   I discussed the importance of continued conversation with family and their medical providers regarding overall plan of care and treatment options, ensuring decisions are within the context of the patients values and GOCs.  Assessment & Plan Established therapeutic relationship. Education provided on palliative's role in collaboration with their Oncology/Radiation team.  Lung cancer with metastasis Stage 1 lung cancer with metastasis to lymph nodes and rib area. Current treatment includes strong oral medications. Recent scans show lymph node and rib metastasis causing significant pain. - Continue current cancer medication regimen. - Follow up with Dr. Selinda Dales on June 25th after scans on June 18th.  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 Current management includes oxycodone  and lidocaine  patches. Gabapentin caused excessive drowsiness. Considering Celebrex for bone pain.  Discussed potential nerve block or osteo cool procedure  if Celebrex is insufficient. - Start Celebrex one capsule twice a day, discontinue meloxicam. - Continue oxycodone  as needed for severe pain. - Place referral for interventional radiology for potential nerve block or osteo cool procedure.  Vocal cord paralysis Left vocal cord paralysis due to lymph node involvement affecting nerve. Previous vocal cord filler injection improved voice. - Monitor voice and breathing for signs of strain.  Tobacco use Quit tobacco use 43 years ago. Lung cancer diagnosis despite long-term cessation.  Goals of Care Advanced directives include do not resuscitate order and no life support measures. Spouse is the Management consultant. Emphasized importance of aligning treatment with his values and preferences. - Bring copy of advanced directives and healthcare power of attorney to next appointment for scanning into electronic medical record.  Follow-up Follow-up appointments scheduled to monitor cancer progression and treatment efficacy. - Follow up with Dr. Ali Ink on June 25th after scans on June 18th. - Schedule follow-up  on June 25th after Dr. Joen Moreno appointment.  Patient expressed understanding and was in agreement with this plan. He also understands that He can call the clinic at any time with any questions, concerns, or complaints.   Thank you for your referral and allowing Palliative to assist in [facility].  [facility] care.   Number and complexity of problems addressed: HIGH - 1 or more chronic illnesses with SEVERE exacerbation, progression, or side effects of treatment - advanced cancer, pain. Any controlled substances utilized were prescribed in the context of palliative care.   Visit consisted of counseling and education dealing with the complex and emotionally intense issues of symptom management and palliative care in the setting of serious and potentially life-threatening illness.  Signed by: Dellia Ferguson, AGPCNP-BC Palliative  Medicine Team/[facility]

## 2023-12-04 ENCOUNTER — Other Ambulatory Visit: Payer: Self-pay

## 2023-12-06 ENCOUNTER — Other Ambulatory Visit: Payer: Self-pay

## 2023-12-06 NOTE — Progress Notes (Signed)
 Specialty Pharmacy Refill Coordination Note  Chad Moreno is a 83 y.o. male contacted today regarding refills of specialty medication(s) Binimetinib  (Mektovi ); Encorafenib  (Braftovi )   Patient requested Delivery   Delivery date: 12/11/23   Verified address: [address], [place], [place] [address]   Medication will be filled on 06.17.25.

## 2023-12-10 ENCOUNTER — Other Ambulatory Visit: Payer: Self-pay | Admitting: Nurse Practitioner

## 2023-12-10 DIAGNOSIS — C7951 Secondary malignant neoplasm of bone: Secondary | ICD-10-CM

## 2023-12-10 DIAGNOSIS — C3492 Malignant neoplasm of unspecified part of left bronchus or lung: Secondary | ICD-10-CM

## 2023-12-10 DIAGNOSIS — G893 Neoplasm related pain (acute) (chronic): Secondary | ICD-10-CM

## 2023-12-11 ENCOUNTER — Ambulatory Visit
Admission: RE | Admit: 2023-12-11 | Discharge: 2023-12-11 | Disposition: A | Discharge status: Home / Self Care | Source: Ambulatory Visit | Attending: Nurse Practitioner | Admitting: Nurse Practitioner

## 2023-12-11 ENCOUNTER — Ambulatory Visit (HOSPITAL_COMMUNITY)
Admission: RE | Admit: 2023-12-11 | Discharge: 2023-12-11 | Disposition: A | Discharge status: Home / Self Care | Source: Ambulatory Visit | Attending: Physician Assistant | Admitting: Physician Assistant

## 2023-12-11 DIAGNOSIS — G893 Neoplasm related pain (acute) (chronic): Secondary | ICD-10-CM

## 2023-12-11 DIAGNOSIS — C3490 Malignant neoplasm of unspecified part of unspecified bronchus or lung: Secondary | ICD-10-CM | POA: Diagnosis not present

## 2023-12-11 DIAGNOSIS — C3492 Malignant neoplasm of unspecified part of left bronchus or lung: Secondary | ICD-10-CM | POA: Insufficient documentation

## 2023-12-11 DIAGNOSIS — C7951 Secondary malignant neoplasm of bone: Secondary | ICD-10-CM | POA: Diagnosis not present

## 2023-12-11 DIAGNOSIS — J90 Pleural effusion, not elsewhere classified: Secondary | ICD-10-CM | POA: Diagnosis not present

## 2023-12-11 MED ORDER — IOHEXOL 300 MG/ML  SOLN
100.0000 mL | Freq: Once | INTRAMUSCULAR | Status: AC | PRN
Start: 2023-12-11 — End: 2023-12-11
  Administered 2023-12-11: 100 mL via INTRAVENOUS

## 2023-12-11 NOTE — Consult Note (Signed)
 Chief Complaint: Consult for right sided back pain.  Referring Provider(s): Pickenpack-Cousar,Athena N   Supervising Physician: Susan Ensign  Patient Status: DRI-OPt  History of Present Illness: Chad Moreno is a 83 y.o. male with recurrent/metastatic (T1c,N3,M1b) non-small cell lung cancer.  As part of his metastatic diease, the patient has involvement of the right 9th posterior rib and transverse process of the corresponding vertebral body.  The involved portions are sclerotic without expansion.  The patient complains of focal sharp and shooting pain in this region.  The pain is intermittent, but likely less than otherwise would be the case as the patient significantly reduces all activity which may exacerbate his pain.  He is currently taking oxycodone  when needed and this is effective for pain control on a limited basis.  He is interested in more lasting results.  He states he does not have pain free days.   Overall, challenging to determine if the pain is related to intercostal nerve irritation, pleural irritation or bone turnover pain.   We discussed a nerve block either intercostal or paravertebral with long acting local anesthetic and a steroid vs thermal ablation of the area for a more local regional control of his pain.  The two procedures were discussed in detail with risks, benefits, and alternatives discussed as well.    Past Medical History:  Diagnosis Date   Age-related cataract of left eye    Amblyopia, right eye    Arthritis    Cancer (HCC)    Dysuria    ED (erectile dysfunction)    Elevated PSA    Epidermal cyst    Family history of prostate cancer    Gout    Hip pain    Histoplasmosis    Hyperlipidemia    Hypermetropia, left eye    Hypertension    Impaired glucose tolerance    Joint pain    Left shoulder pain    Lower back pain    Melanocytic nevi, unspecified    Nummular dermatitis    Ocular hypertension    Palmar fascial fibromatosis     Prostate cancer screening    Regular astigmatism, right eye    Senile nuclear sclerosis     Past Surgical History:  Procedure Laterality Date   BRONCHIAL BIOPSY  08/27/2022   Procedure: BRONCHIAL BIOPSIES;  Surgeon: Denson Flake, MD;  Location: [facility];  Service: Pulmonary;;   BRONCHIAL BRUSHINGS  08/27/2022   Procedure: BRONCHIAL BRUSHINGS;  Surgeon: Denson Flake, MD;  Location: [facility];  Service: Pulmonary;;   BRONCHIAL NEEDLE ASPIRATION BIOPSY  08/27/2022   Procedure: BRONCHIAL NEEDLE ASPIRATION BIOPSIES;  Surgeon: Denson Flake, MD;  Location: [facility];  Service: Pulmonary;;   CONSTRICTING FINGER RING EXCISION Left    CYST REMOVAL TRUNK Left    left posterior shoulder   FIDUCIAL MARKER PLACEMENT  08/27/2022   Procedure: FIDUCIAL MARKER PLACEMENT;  Surgeon: Denson Flake, MD;  Location: [facility];  Service: Pulmonary;;   GOLD SEED IMPLANT N/A 10/02/2022   Procedure: GOLD SEED IMPLANT;  Surgeon: Mallie Seal, MD;  Location: [facility];  Service: Urology;  Laterality: N/A;   index finger Left    IR THORACENTESIS ASP PLEURAL SPACE W/IMG GUIDE  06/14/2023   Prostate needle biopsy     SPACE OAR INSTILLATION N/A 10/02/2022   Procedure: SPACE OAR INSTILLATION;  Surgeon: Mallie Seal, MD;  Location: [facility];  Service: Urology;  Laterality: N/A;    Allergies: Sulfa  antibiotics  Medications: Prior to Admission medications   Medication Sig Start Date End Date Taking? Authorizing Provider  amLODipine (NORVASC) 2.5 MG tablet Take 2.5 mg by mouth in the morning. 02/07/19   [provider]  Ascorbic Acid (VITAMIN C PO) Take 1 tablet by mouth daily with breakfast.    [provider]  aspirin EC 81 MG tablet Take 81 mg by mouth in the morning.    [provider]  B Complex Vitamins (VITAMIN-B COMPLEX) TABS Take 1 tablet by mouth daily with breakfast.    [provider]  binimetinib  (MEKTOVI ) 15 MG  tablet Take 3 tablets (45 mg total) by mouth 2 (two) times daily. 11/06/23   Marlene Simas, MD  calcium carbonate (OSCAL) 1500 (600 Ca) MG TABS tablet Take 1,200 mg by mouth 2 (two) times daily with a meal.    [provider]  celecoxib  (CELEBREX ) 200 MG capsule Take 1 capsule (200 mg total) by mouth 2 (two) times daily. 12/03/23   Pickenpack-Cousar, Athena N, NP  Cholecalciferol (VITAMIN D3) 50 MCG (2000 UT) TABS Take 2,000 Units by mouth daily with breakfast.    [provider]  cyanocobalamin (VITAMIN B12) 1000 MCG tablet Take 1,000 mcg by mouth daily.    [provider]  encorafenib  (BRAFTOVI ) 75 MG capsule Take 6 capsules (450 mg total) by mouth daily. 11/06/23   Marlene Simas, MD  ferrous sulfate  325 (65 FE) MG EC tablet Take 1 tablet (325 mg total) by mouth daily as needed. 12/03/23   Pickenpack-Cousar, Athena N, NP  fluticasone (FLONASE) 50 MCG/ACT nasal spray Place 2 sprays into both nostrils daily.    [provider]  losartan (COZAAR) 100 MG tablet Take 50 mg by mouth daily. 02/07/19   [provider]  Omega-3 Fatty Acids (FISH OIL PO) Take 1 capsule by mouth daily with breakfast.    [provider]  oxyCODONE -acetaminophen  (PERCOCET) 7.5-325 MG tablet Take 1 tablet by mouth every 6 (six) hours as needed for severe pain (pain score 7-10). 11/20/23   Heilingoetter, Cassandra L, PA-C  POTASSIUM PO Take 1 tablet by mouth daily with breakfast.    [provider]  Probiotic Product (PROBIOTIC PEARLS ADVANTAGE PO) Take 1 capsule by mouth in the morning.    [provider]  silodosin  (RAPAFLO ) 8 MG CAPS capsule Take 8 mg by mouth daily. 07/04/23   [provider]     Family History  Problem Relation Age of Onset   Prostate cancer Father     Social History   Socioeconomic History   Marital status: Married    Spouse name: Bernardo Bridgeman   Number of children: Not on file   Years of education: Not on file   Highest  education level: Not on file  Occupational History   Not on file  Tobacco Use   Smoking status: Former    Current packs/day: 0.00    Average packs/day: 0.5 packs/day for 10.0 years (5.0 ttl pk-yrs)    Types: Cigarettes    Start date: 80    Quit date: 27    Years since quitting: 44.4   Smokeless tobacco: Never  Vaping Use   Vaping status: Never Used  Substance and Sexual Activity   Alcohol  use: Yes    Comment: rare   Drug use: Never   Sexual activity: Not on file  Other Topics Concern   Not on file  Social History Narrative   Not on file   Social Drivers of Health  Financial Resource Strain: Not on file  Food Insecurity: No Food Insecurity (01/16/2023)   Hunger Vital Sign    Worried About Running Out of Food in the Last Year: Never true    Ran Out of Food in the Last Year: Never true  Transportation Needs: No Transportation Needs (01/16/2023)   PRAPARE - Administrator, Civil Service (Medical): No    Lack of Transportation (Non-Medical): No  Physical Activity: Not on file  Stress: Not on file  Social Connections: Not on file     Review of Systems: A 12 point ROS discussed and pertinent positives are indicated in the HPI above.  All other systems are negative.  Review of Systems  Vital Signs: BP (!) 141/76 (BP Location: Right Arm)   Pulse 70   Temp (!) 97.5 F (36.4 C) (Oral)   Resp 16   SpO2 97%      Physical Exam  General:  No acute distress, appears stated age, cooperative Pulmonary:  Respirations unlabored, clear to auscultation bilaterally Cardiac:  Regular rate and rhythm   Imaging: No results found.  Labs:  CBC: Recent Labs    08/13/23 0941 09/12/23 0914 10/15/23 1034 11/20/23 1434  WBC 4.0 3.5* 4.3 4.7  HGB 10.7* 10.5* 10.4* 9.9*  HCT 30.9* 30.8* 30.3* 29.3*  PLT 124* 115* 115* 125*    COAGS: No results for input(s): INR, APTT in the last 8760 hours.  BMP: Recent Labs    08/13/23 0941 09/12/23 0914  10/15/23 1034 11/20/23 1434  NA 139 141 141 137  K 4.2 4.7 5.0 4.7  CL 109 109 108 105  CO2 24 26 28 27   GLUCOSE 119* 99 115* 85  BUN 19 19 20  32*  CALCIUM 8.4* 8.6* 8.8* 9.1  CREATININE 0.92 0.94 1.02 0.98  GFRNONAA >60 >60 >60 >60    LIVER FUNCTION TESTS: Recent Labs    08/13/23 0941 09/12/23 0914 10/15/23 1034 11/20/23 1434  BILITOT 0.5 0.4 0.4 0.3  AST 15 19 19 20   ALT 13 17 21 16   ALKPHOS 61 56 52 55  PROT 5.9* 6.2* 6.2* 6.6  ALBUMIN 3.8 3.7 3.8 3.8    TUMOR MARKERS: No results for input(s): AFPTM, CEA, CA199, CHROMGRNA in the last 8760 hours.  Assessment and Plan:   Focal pain at the costovertebral junction.  The patient would likely benefit with a nerve block.  The plan would be to perform the procedure with U/S and Fluoro combined using bupivicaine and triamcinolone  injection.  The procedure could be done and [facility] and would need around 1-1.5hr time.     Electronically Signed: Rosetta Cons, MD   12/11/2023, 3:49 PM     I spent a total of  40 Minutes   in face to face in clinical consultation, greater than 50% of which was counseling/coordinating care for nerve block of the 9th rib associated malignancy pain due to nerve root involvement.

## 2023-12-16 DIAGNOSIS — C61 Malignant neoplasm of prostate: Secondary | ICD-10-CM | POA: Diagnosis not present

## 2023-12-18 ENCOUNTER — Encounter: Payer: Self-pay | Admitting: Nurse Practitioner

## 2023-12-18 ENCOUNTER — Inpatient Hospital Stay (HOSPITAL_BASED_OUTPATIENT_CLINIC_OR_DEPARTMENT_OTHER): Admitting: Nurse Practitioner

## 2023-12-18 ENCOUNTER — Inpatient Hospital Stay

## 2023-12-18 ENCOUNTER — Inpatient Hospital Stay: Admitting: Internal Medicine

## 2023-12-18 VITALS — BP 145/65 | HR 72 | Temp 99.8°F | Resp 17 | Ht 72.0 in | Wt 208.7 lb

## 2023-12-18 DIAGNOSIS — Z87891 Personal history of nicotine dependence: Secondary | ICD-10-CM | POA: Diagnosis not present

## 2023-12-18 DIAGNOSIS — C3431 Malignant neoplasm of lower lobe, right bronchus or lung: Secondary | ICD-10-CM | POA: Diagnosis not present

## 2023-12-18 DIAGNOSIS — C3492 Malignant neoplasm of unspecified part of left bronchus or lung: Secondary | ICD-10-CM | POA: Diagnosis not present

## 2023-12-18 DIAGNOSIS — R630 Anorexia: Secondary | ICD-10-CM | POA: Diagnosis not present

## 2023-12-18 DIAGNOSIS — G893 Neoplasm related pain (acute) (chronic): Secondary | ICD-10-CM

## 2023-12-18 DIAGNOSIS — Z515 Encounter for palliative care: Secondary | ICD-10-CM

## 2023-12-18 DIAGNOSIS — C7951 Secondary malignant neoplasm of bone: Secondary | ICD-10-CM | POA: Diagnosis not present

## 2023-12-18 LAB — CMP (CANCER CENTER ONLY)
ALT: 12 U/L (ref 0–44)
AST: 15 U/L (ref 15–41)
Albumin: 3.5 g/dL (ref 3.5–5.0)
Alkaline Phosphatase: 54 U/L (ref 38–126)
Anion gap: 6 (ref 5–15)
BUN: 26 mg/dL — ABNORMAL HIGH (ref 8–23)
CO2: 25 mmol/L (ref 22–32)
Calcium: 8.8 mg/dL — ABNORMAL LOW (ref 8.9–10.3)
Chloride: 107 mmol/L (ref 98–111)
Creatinine: 1.08 mg/dL (ref 0.61–1.24)
GFR, Estimated: >60 mL/min (ref >60)
Glucose, Bld: 114 mg/dL — ABNORMAL HIGH (ref 70–99)
Potassium: 5.1 mmol/L (ref 3.5–5.1)
Sodium: 138 mmol/L (ref 135–145)
Total Bilirubin: 0.3 mg/dL (ref 0.0–1.2)
Total Protein: 6.2 g/dL — ABNORMAL LOW (ref 6.5–8.1)

## 2023-12-18 LAB — CBC WITH DIFFERENTIAL (CANCER CENTER ONLY)
Abs Immature Granulocytes: 0.04 10*3/uL (ref 0.00–0.07)
Basophils Absolute: 0 10*3/uL (ref 0.0–0.1)
Basophils Relative: 1 %
Eosinophils Absolute: 0.1 10*3/uL (ref 0.0–0.5)
Eosinophils Relative: 1 %
HCT: 27.8 % — ABNORMAL LOW (ref 39.0–52.0)
Hemoglobin: 9.3 g/dL — ABNORMAL LOW (ref 13.0–17.0)
Immature Granulocytes: 1 %
Lymphocytes Relative: 11 %
Lymphs Abs: 0.6 10*3/uL — ABNORMAL LOW (ref 0.7–4.0)
MCH: 28.7 pg (ref 26.0–34.0)
MCHC: 33.5 g/dL (ref 30.0–36.0)
MCV: 85.8 fL (ref 80.0–100.0)
Monocytes Absolute: 0.5 10*3/uL (ref 0.1–1.0)
Monocytes Relative: 10 %
Neutro Abs: 4.1 10*3/uL (ref 1.7–7.7)
Neutrophils Relative %: 76 %
Platelet Count: 120 10*3/uL — ABNORMAL LOW (ref 150–400)
RBC: 3.24 MIL/uL — ABNORMAL LOW (ref 4.22–5.81)
RDW: 14.1 % (ref 11.5–15.5)
WBC Count: 5.4 10*3/uL (ref 4.0–10.5)
nRBC: 0 % (ref 0.0–0.2)

## 2023-12-18 MED ORDER — OXYCODONE-ACETAMINOPHEN 7.5-325 MG PO TABS: 1.0000 | ORAL_TABLET | Freq: Four times a day (QID) | ORAL | 0 refills | Status: DC | PRN

## 2023-12-18 NOTE — Progress Notes (Signed)
 [facility] [phone]   [phone]  OFFICE PROGRESS NOTE  Chad Santos, MD [address] [place] [address]  DIAGNOSIS:  Recurrent/metastatic (T1c, N3, M1b) non-small cell lung cancer, adenocarcinoma initially diagnosed as early stage disease, stage I in March 2024 involving the right lower lobe and left upper lobe status post SBRT completed in May 2024.  The patient presented with recurrent and metastatic disease to bilateral supraclavicular, subpectoral, left axillary as well as upper abdominal lymphadenopathy and malignant left pleural effusion in December 2024 with confirmation of malignancy from the pleural effusion.   Biomarker Findings HRD signature - Cannot Be Determined Microsatellite status - Cannot Be Determined ? Tumor Mutational Burden - Cannot Be Determined Genomic Findings For a complete list of the genes assayed, please refer to the Appendix. BRAF V600E CHEK2 R474C CTNNB1 S45C DNMT3A W330* PPARG E3K 7 Disease relevant genes with no reportable alterations: ALK, EGFR, ERBB2, KRAS, MET, RET, ROS1  PDL1 TPS 1%  PRIOR THERAPY: None  CURRENT THERAPY:  Encorafenib  450 mg p.o. daily and binimetinib  45 mg p.o. twice daily.  First dose July 22, 2023.  INTERVAL HISTORY: Chad Moreno 83 y.o. male returns to the clinic today for follow-up visit accompanied by his wife.  Chad Moreno is an 83 year old male with metastatic non-small cell lung cancer who presents for evaluation with repeat CT scan for restaging of his disease.  He has a history of metastatic non-small cell lung cancer, adenocarcinoma, diagnosed in May 2024, with a BRAF V600E mutation and PD-L1 expression of 1%. He developed a malignant pleural effusion in December 2024 and began treatment with encorafenib  and binimetinib  on July 22, 2023.  He experiences lightheadedness and dizziness in the mornings after getting up and going to the bathroom,  which improves as the morning progresses. He monitors his blood pressure, noting it is around 118/83 mmHg in the morning and increases to 138 mmHg later in the morning. He takes nine pills in the morning and three in the afternoon, and feels more lightheaded after taking his medications. He is currently taking multiple blood pressure medications, including amlodipine and losartan. His primary care physician adjusted his blood pressure medication by stopping a half dose he was taking in the evening.  He underwent a CT scan last week, which showed a slight decrease in the treated mass in the right lower lobe and unchanged mass in the left upper lobe. The left pleural fluid has decreased, and the spots in his ribs and other bones are stable. There is no change in the resolved six lymph nodes, and no new spots or spread of the disease were noted.  He has ongoing anemia, which he attributes to his age and cancer. He continues with nutritional supplements.  MEDICAL HISTORY: Past Medical History:  Diagnosis Date   Age-related cataract of left eye    Amblyopia, right eye    Arthritis    Cancer (HCC)    Dysuria    ED (erectile dysfunction)    Elevated PSA    Epidermal cyst    Family history of prostate cancer    Gout    Hip pain    Histoplasmosis    Hyperlipidemia    Hypermetropia, left eye    Hypertension    Impaired glucose tolerance    Joint pain    Left shoulder pain    Lower back pain    Melanocytic nevi, unspecified    Nummular dermatitis  Ocular hypertension    Palmar fascial fibromatosis    Prostate cancer screening    Regular astigmatism, right eye    Senile nuclear sclerosis     ALLERGIES:  is allergic to sulfa antibiotics.  MEDICATIONS:  Current Outpatient Medications  Medication Sig Dispense Refill   amLODipine (NORVASC) 2.5 MG tablet Take 2.5 mg by mouth in the morning.     Ascorbic Acid (VITAMIN C PO) Take 1 tablet by mouth daily with breakfast.     aspirin EC 81 MG  tablet Take 81 mg by mouth in the morning.     B Complex Vitamins (VITAMIN-B COMPLEX) TABS Take 1 tablet by mouth daily with breakfast.     binimetinib  (MEKTOVI ) 15 MG tablet Take 3 tablets (45 mg total) by mouth 2 (two) times daily. 180 tablet 3   calcium carbonate (OSCAL) 1500 (600 Ca) MG TABS tablet Take 1,200 mg by mouth 2 (two) times daily with a meal.     celecoxib  (CELEBREX ) 200 MG capsule Take 1 capsule (200 mg total) by mouth 2 (two) times daily. 60 capsule 2   Cholecalciferol (VITAMIN D3) 50 MCG (2000 UT) TABS Take 2,000 Units by mouth daily with breakfast.     cyanocobalamin (VITAMIN B12) 1000 MCG tablet Take 1,000 mcg by mouth daily.     encorafenib  (BRAFTOVI ) 75 MG capsule Take 6 capsules (450 mg total) by mouth daily. 180 capsule 3   ferrous sulfate  325 (65 FE) MG EC tablet Take 1 tablet (325 mg total) by mouth daily as needed.     fluticasone (FLONASE) 50 MCG/ACT nasal spray Place 2 sprays into both nostrils daily.     losartan (COZAAR) 100 MG tablet Take 50 mg by mouth daily.     Omega-3 Fatty Acids (FISH OIL PO) Take 1 capsule by mouth daily with breakfast.     oxyCODONE -acetaminophen  (PERCOCET) 7.5-325 MG tablet Take 1 tablet by mouth every 6 (six) hours as needed for severe pain (pain score 7-10). 30 tablet 0   POTASSIUM PO Take 1 tablet by mouth daily with breakfast.     Probiotic Product (PROBIOTIC PEARLS ADVANTAGE PO) Take 1 capsule by mouth in the morning.     silodosin  (RAPAFLO ) 8 MG CAPS capsule Take 8 mg by mouth daily.     No current facility-administered medications for this visit.    SURGICAL HISTORY:  Past Surgical History:  Procedure Laterality Date   BRONCHIAL BIOPSY  08/27/2022   Procedure: BRONCHIAL BIOPSIES;  Surgeon: Shelah Lamar RAMAN, MD;  Location: [facility];  Service: Pulmonary;;   BRONCHIAL BRUSHINGS  08/27/2022   Procedure: BRONCHIAL BRUSHINGS;  Surgeon: Shelah Lamar RAMAN, MD;  Location: [facility];  Service: Pulmonary;;   BRONCHIAL NEEDLE ASPIRATION  BIOPSY  08/27/2022   Procedure: BRONCHIAL NEEDLE ASPIRATION BIOPSIES;  Surgeon: Shelah Lamar RAMAN, MD;  Location: [facility];  Service: Pulmonary;;   CONSTRICTING FINGER RING EXCISION Left    CYST REMOVAL TRUNK Left    left posterior shoulder   FIDUCIAL MARKER PLACEMENT  08/27/2022   Procedure: FIDUCIAL MARKER PLACEMENT;  Surgeon: Shelah Lamar RAMAN, MD;  Location: [facility];  Service: Pulmonary;;   GOLD SEED IMPLANT N/A 10/02/2022   Procedure: GOLD SEED IMPLANT;  Surgeon: Lovie Arlyss CROME, MD;  Location: [facility];  Service: Urology;  Laterality: N/A;   index finger Left    IR THORACENTESIS ASP PLEURAL SPACE W/IMG GUIDE  06/14/2023   Prostate needle biopsy     SPACE OAR INSTILLATION N/A 10/02/2022  Procedure: SPACE OAR INSTILLATION;  Surgeon: Lovie Arlyss CROME, MD;  Location: [facility];  Service: Urology;  Laterality: N/A;    REVIEW OF SYSTEMS:  Constitutional: positive for fatigue Eyes: negative Ears, nose, mouth, throat, and face: negative Respiratory: negative Cardiovascular: negative Gastrointestinal: negative Genitourinary:negative Integument/breast: negative Hematologic/lymphatic: negative Musculoskeletal:negative Neurological: negative Behavioral/Psych: negative Endocrine: negative Allergic/Immunologic: negative   PHYSICAL EXAMINATION: General appearance: alert, cooperative, fatigued, and no distress Head: Normocephalic, without obvious abnormality, atraumatic Neck: no adenopathy, no JVD, supple, symmetrical, trachea midline, and thyroid  not enlarged, symmetric, no tenderness/mass/nodules Lymph nodes: Cervical, supraclavicular, and axillary nodes normal. Resp: clear to auscultation bilaterally Back: symmetric, no curvature. ROM normal. No CVA tenderness. Cardio: regular rate and rhythm, S1, S2 normal, no murmur, click, rub or gallop GI: soft, non-tender; bowel sounds normal; no masses,  no organomegaly Extremities: extremities normal,  atraumatic, no cyanosis or edema Neurologic: Alert and oriented X 3, normal strength and tone. Normal symmetric reflexes. Normal coordination and gait  ECOG PERFORMANCE STATUS: 1 - Symptomatic but completely ambulatory  Blood pressure (!) 145/65, pulse 72, temperature 99.8 F (37.7 C), temperature source Oral, resp. rate 17, height 6' (1.829 m), weight 208 lb 11.2 oz (94.7 kg), SpO2 100%.  LABORATORY DATA: Lab Results  Component Value Date   WBC 5.4 12/18/2023   HGB 9.3 (L) 12/18/2023   HCT 27.8 (L) 12/18/2023   MCV 85.8 12/18/2023   PLT 120 (L) 12/18/2023      Chemistry      Component Value Date/Time   NA 138 12/18/2023 1100   K 5.1 12/18/2023 1100   CL 107 12/18/2023 1100   CO2 25 12/18/2023 1100   BUN 26 (H) 12/18/2023 1100   CREATININE 1.08 12/18/2023 1100      Component Value Date/Time   CALCIUM 8.8 (L) 12/18/2023 1100   ALKPHOS 54 12/18/2023 1100   AST 15 12/18/2023 1100   ALT 12 12/18/2023 1100   BILITOT 0.3 12/18/2023 1100       RADIOGRAPHIC STUDIES: CT CHEST ABDOMEN PELVIS W CONTRAST Result Date: 12/12/2023 CLINICAL DATA:  Non-small cell lung cancer restaging, metastatic disease evaluation * Tracking Code: BO * EXAM: CT CHEST, ABDOMEN, AND PELVIS WITH CONTRAST TECHNIQUE: Multidetector CT imaging of the chest, abdomen and pelvis was performed following the standard protocol during bolus administration of intravenous contrast. RADIATION DOSE REDUCTION: This exam was performed according to the departmental dose-optimization program which includes automated exposure control, adjustment of the mA and/or kV according to patient size and/or use of iterative reconstruction technique. CONTRAST:  OMNIPAQUE  IOHEXOL  300 MG/ML  SOLN COMPARISON:  09/06/2023 FINDINGS: CT CHEST FINDINGS Cardiovascular: Aortic atherosclerosis. Normal heart size. Left and right coronary artery calcifications. No pericardial effusion. Mediastinum/Nodes: No enlarged mediastinal, hilar, or axillary  lymph nodes. Thyroid  gland, trachea, and esophagus demonstrate no significant findings. Lungs/Pleura: Unchanged elevation of the right hemidiaphragm. Perhaps slightly diminished size of a treated mass in the infrahilar right lower lobe, measuring 3.3 x 1.9 cm (series 4, image 77). Unchanged treated mass in the posterior left upper lobe measuring 4.7 x 2.3 cm (series 4, image 57). Diminished volume of a small left pleural effusion. No pleural effusion or pneumothorax. Musculoskeletal: No chest wall abnormality. No acute osseous findings. CT ABDOMEN PELVIS FINDINGS Hepatobiliary: No solid liver abnormality is seen. Contracted gallbladder. No gallstones, gallbladder wall thickening, or biliary dilatation. Pancreas: Unremarkable. No pancreatic ductal dilatation or surrounding inflammatory changes. Spleen: Normal in size without significant abnormality. Adrenals/Urinary Tract: Unchanged macroscopic fat containing left adrenal  adenoma, benign, requiring no further follow-up or characterization. Kidneys are normal, without renal calculi, solid lesion, or hydronephrosis. Mild diffuse urinary bladder wall thickening, unchanged. Stomach/Bowel: Stomach is within normal limits. Appendix appears normal. No evidence of bowel wall thickening, distention, or inflammatory changes. Sigmoid diverticulosis. Vascular/Lymphatic: Aortic atherosclerosis. No enlarged abdominal or pelvic lymph nodes. Reproductive: Biopsy markers or fiducials in the prostate. Other: No abdominal wall hernia or abnormality. No ascites. Musculoskeletal: No acute osseous findings. 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 IMPRESSION: 1. Perhaps slightly diminished size of a treated mass in the infrahilar right lower lobe. Unchanged treated mass in the posterior left upper lobe. 2. Diminished volume of a small left pleural effusion. 3.  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 4. Unchanged post treatment appearance of the prostate and low pelvis. 5. No evidence of lymphadenopathy or soft tissue metastatic disease in the chest, abdomen, or pelvis. 6. Coronary artery disease. Aortic Atherosclerosis (ICD10-I70.0). Electronically Signed   By: Marolyn JONETTA Jaksch M.D.   On: 12/12/2023 06:49     ASSESSMENT AND PLAN: This is a very pleasant 83 years old white male with Recurrent/metastatic (T1c, N3, M1b) non-small cell lung cancer, adenocarcinoma initially diagnosed as early stage disease, stage I in March 2024 involving the right lower lobe and left upper lobe status post SBRT completed in May 2024.  The patient presented with recurrent and metastatic disease to bilateral supraclavicular, subpectoral, left axillary as well as upper abdominal lymphadenopathy and malignant left pleural effusion in December 2024 with confirmation of malignancy from the pleural effusion. Molecular studies by foundation 1 showed positive BRAF V600E mutation and PD-L1 expression of 1%. The patient is currently on treatment with encorafenib  450 mg p.o. twice daily in addition to binimetinib  45 mg milligram p.o. twice daily.  First dose was July 22, 2023. He continues to tolerate this treatment fairly well with no concerning adverse effects  Metastatic non-small cell lung cancer Metastatic non-small cell lung cancer, adenocarcinoma, with BRAF V600E mutation and PD-L1 expression of 1%. Diagnosed in May 2024 with malignant pleural effusion noted in December 2024. Currently on encorafenib  and binimetinib  since July 22, 2023. Recent CT scan shows slight decrease in the treated mass in the right lower lobe and stable disease in the left upper lobe. Left pleural fluid has decreased, and bone metastases are stable. No new lesions or lymph node enlargement. Lymph nodes under the left arm and  collarbone remain unchanged, indicating no spread. - Continue encorafenib  and binimetinib . - Coordinate with palliative care for pain management and potential nerve block procedure. - Schedule follow-up in six weeks for blood work and in three months for repeat CT scan.  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 Persistent anemia unlikely to return to normal levels due to underlying disease and age. Nutritional supplements are advised to prevent worsening. - Continue nutritional supplements.  Hypertension Hypertension with episodes of lightheadedness, possibly related to medication timing. Blood pressure readings vary throughout the day. Current medications include amlodipine and losartan. Advised to separate blood pressure medications from cancer medications to reduce side effects. Discussion with primary care physician regarding the necessity and dosing of blood pressure medications is recommended. - Discuss blood pressure medication dosing and necessity with primary care physician. - Separate blood pressure medications  from cancer medications by a couple of hours. The patient voices understanding of current disease status and treatment options and is in agreement with the current care plan.  All questions were answered. The patient knows to call the clinic with any problems, questions or concerns. We can certainly see the patient much sooner if necessary.  The total time spent in the appointment was 20 minutes.  Disclaimer: This note was dictated with voice recognition software. Similar sounding words can inadvertently be transcribed and may not be corrected upon review.

## 2023-12-18 NOTE — Progress Notes (Signed)
 Pt given number for [facility] to schedule nerve block at [phone]. Pt verbalized understanding, no further needs at this time.

## 2023-12-18 NOTE — Progress Notes (Signed)
 Palliative Medicine [facility]  [phone]) [phone] [phone]) [phone]   Name: Chad Moreno Date: 12/18/2023 MRN: [id number]  DOB: Jan 19, 1941  Patient Care Team: [name], MD as PCP - General (Internal Medicine) Livingston Rigg, MD (Inactive) as Consulting Physician (Dermatology) Vertell Pont, RN as Oncology Nurse Navigator Bouchard, Duwaine BROCKS, RN as Oncology Nurse Navigator Pickenpack-Cousar, Fannie SAILOR, NP as Nurse Practitioner [facility] and Palliative Medicine)    INTERVAL HISTORY: Chad Moreno is a 83 y.o. male with  oncologic medical history including recurrent metastatic non-small cell lung cancer initially diagnosed in March 2024 s/p SBRT.  Now with metastatic disease including left axillary, bilateral supraclavicular, abdominal lymphadenopathy, and malignant pleural effusion (05/2023) s/p palliative radiation to right chest completed April 2025.  Patient currently on Encorafenib  and binimetinib .  Palliative is seeing patient for symptom management and goals of care.   SOCIAL HISTORY:     reports that he quit smoking about 44 years ago. His smoking use included cigarettes. He started smoking about 54 years ago. He has a 5 pack-year smoking history. He has never used smokeless tobacco. He reports current alcohol  use. He reports that he does not use drugs.  ADVANCE DIRECTIVES:  Patient to bring in documents to be scanned on file. He reports his wife Chad Moreno is his Museum/gallery exhibitions officer.   CODE STATUS: DNR  PAST MEDICAL HISTORY: Past Medical History:  Diagnosis Date   Age-related cataract of left eye    Amblyopia, right eye    Arthritis    Cancer (HCC)    Dysuria    ED (erectile dysfunction)    Elevated PSA    Epidermal cyst    Family history of prostate cancer    Gout    Hip pain    Histoplasmosis    Hyperlipidemia    Hypermetropia, left eye    Hypertension    Impaired glucose tolerance    Joint pain    Left shoulder pain     Lower back pain    Melanocytic nevi, unspecified    Nummular dermatitis    Ocular hypertension    Palmar fascial fibromatosis    Prostate cancer screening    Regular astigmatism, right eye    Senile nuclear sclerosis     ALLERGIES:  is allergic to sulfa antibiotics.  MEDICATIONS:  Current Outpatient Medications  Medication Sig Dispense Refill   amLODipine (NORVASC) 2.5 MG tablet Take 2.5 mg by mouth in the morning.     Ascorbic Acid (VITAMIN C PO) Take 1 tablet by mouth daily with breakfast.     aspirin EC 81 MG tablet Take 81 mg by mouth in the morning.     B Complex Vitamins (VITAMIN-B COMPLEX) TABS Take 1 tablet by mouth daily with breakfast.     binimetinib  (MEKTOVI ) 15 MG tablet Take 3 tablets (45 mg total) by mouth 2 (two) times daily. 180 tablet 3   calcium carbonate (OSCAL) 1500 (600 Ca) MG TABS tablet Take 1,200 mg by mouth 2 (two) times daily with a meal.     celecoxib  (CELEBREX ) 200 MG capsule Take 1 capsule (200 mg total) by mouth 2 (two) times daily. 60 capsule 2   Cholecalciferol (VITAMIN D3) 50 MCG (2000 UT) TABS Take 2,000 Units by mouth daily with breakfast.     cyanocobalamin (VITAMIN B12) 1000 MCG tablet Take 1,000 mcg by mouth daily.     encorafenib  (BRAFTOVI ) 75 MG capsule Take 6 capsules (450 mg total) by mouth daily. 180  capsule 3   ferrous sulfate  325 (65 FE) MG EC tablet Take 1 tablet (325 mg total) by mouth daily as needed.     fluticasone (FLONASE) 50 MCG/ACT nasal spray Place 2 sprays into both nostrils daily.     losartan (COZAAR) 100 MG tablet Take 50 mg by mouth daily.     Omega-3 Fatty Acids (FISH OIL PO) Take 1 capsule by mouth daily with breakfast.     oxyCODONE -acetaminophen  (PERCOCET) 7.5-325 MG tablet Take 1 tablet by mouth every 6 (six) hours as needed for severe pain (pain score 7-10). 60 tablet 0   POTASSIUM PO Take 1 tablet by mouth daily with breakfast.     Probiotic Product (PROBIOTIC PEARLS ADVANTAGE PO) Take 1 capsule by mouth in the  morning.     silodosin  (RAPAFLO ) 8 MG CAPS capsule Take 8 mg by mouth daily.     No current facility-administered medications for this visit.    VITAL SIGNS: There were no vitals taken for this visit. There were no vitals filed for this visit.  Estimated body mass index is 28.3 kg/m as calculated from the following:   Height as of an earlier encounter on 12/18/23: 6' (1.829 m).   Weight as of an earlier encounter on 12/18/23: 208 lb 11.2 oz (94.7 kg).   PERFORMANCE STATUS (ECOG) : 1 - Symptomatic but completely ambulatory  Physical Exam General: NAD Cardiovascular: regular rate and rhythm Pulmonary: normal breathing pattern Extremities: no edema, no joint deformities Skin: no rashes Neurological: AAO x3  IMPRESSION: Discussed the use of AI scribe software for clinical note transcription with the patient, who gave verbal consent to proceed.  History of Present Illness Chad Moreno is an 83 year old male with metastatic cancer who presents for follow-up regarding pain management and recent IR consultation. He is doing as well as can be expected. Accompanied by his wife. Denies concerns of nausea, vomiting, constipation, or diarrhea. Occasional fatigue however tries to remain as active as possible.   Appetite is improving. He mentions using ginger and lemon mints, which he finds helpful, with his taste buds allowing him to enjoy some foods that were previously not enjoyed. Weight is stable at 208lbs.   Reports his visit with Chad Moreno today went well. Shares CT scan results showed no changes or spread, with lymph nodes remaining stable. There was a noted reduction in the right lymph node, while the left lymph node showed no change.  Chad Moreno shares recent visit with Chad Moreno in IR for nerve block evaluation. Patient is appreciative that he has this option and express plans to proceed with procedure as discussed during visit. He is awaiting procedure to be scheduled.    He is currently taking oxycodone  7.5 mg, which helps manage his pain however endorses persistent pain and discomfort which does limits his ability to be as active as he likes. Additionally, he is using a lidocaine  patch that effectively reduces pain, noting increased discomfort on days when he forgets to apply it. Celebrex  has provided some relief as well. No adjustments to regimen at this time. Hopeful costovertebral nerve block will allow him some improvement.   All questions answered and support provided.   Goals of Care 12/03/23: We discussed his current illness and what it means in the larger context of his on-going co-morbidities. Natural disease trajectory and expectations were discussed. Mr. Hayduk and his wife are realistic in their understanding of his metastatic cancer and plan of care.  I empathetically approach discussions regarding advanced directives, CODE STATUS, and healthcare limitations.  Although patient does not have an advanced directive on file he states he does have a completed document.  Encouraged patient to bring in to allow for filing.  Patient states his wife Chad Moreno is his medical decision-maker in the event he is unable to speak for himself.  His desires for natural death with no life-sustaining measures.  DNR/DNI.  No artificial feedings long-term.   Patient and wife are clear in her expressed wishes to continue to treat the treatable allow him every opportunity to continue to thrive.  His quality of life is most important to him.  Will like his symptoms to be managed to minimize discomfort.    I discussed the importance of continued conversation with family and their medical providers regarding overall plan of care and treatment options, ensuring decisions are within the context of the patients values and GOCs. Assessment & Plan Cancer Related pain management Pain managed with oxycodone  and lidocaine  patch. Patch effective; oxycodone  provides relief. - Prescribe  oxycodone  7.5 mg. - Continue pain patch.  Pending nerve block procedure Nerve block with bupivicaine and triamcinolone  injection per notation. Scheduling pending due to provider acclimation. - Contact provider to expedite scheduling of nerve block procedure.  I will plan to see patient back in 6 weeks. Sooner if needed.   Patient expressed understanding and was in agreement with this plan. He also understands that He can call the clinic at any time with any questions, concerns, or complaints.   Any controlled substances utilized were prescribed in the context of palliative care. PDMP has been reviewed.    Visit consisted of counseling and education dealing with the complex and emotionally intense issues of symptom management and palliative care in the setting of serious and potentially life-threatening illness.  Levon Borer, AGPCNP-BC  Palliative Medicine Team/[facility]

## 2023-12-20 ENCOUNTER — Encounter: Payer: Self-pay | Admitting: Physician Assistant

## 2023-12-24 ENCOUNTER — Other Ambulatory Visit (HOSPITAL_COMMUNITY): Payer: Self-pay

## 2023-12-24 ENCOUNTER — Telehealth (HOSPITAL_COMMUNITY): Payer: Self-pay

## 2023-12-24 DIAGNOSIS — C7951 Secondary malignant neoplasm of bone: Secondary | ICD-10-CM

## 2023-12-24 DIAGNOSIS — C3492 Malignant neoplasm of unspecified part of left bronchus or lung: Secondary | ICD-10-CM

## 2023-12-24 DIAGNOSIS — G893 Neoplasm related pain (acute) (chronic): Secondary | ICD-10-CM

## 2023-12-24 NOTE — Telephone Encounter (Signed)
 Called to schedule nerve block, no answer, left vm. AB

## 2023-12-30 ENCOUNTER — Inpatient Hospital Stay (HOSPITAL_COMMUNITY)
Admission: EM | Admit: 2023-12-30 | Discharge: 2024-01-08 | DRG: 394 | Disposition: A | Discharge status: Home Health | Attending: Internal Medicine | Admitting: Internal Medicine

## 2023-12-30 ENCOUNTER — Encounter (HOSPITAL_COMMUNITY): Payer: Self-pay

## 2023-12-30 ENCOUNTER — Emergency Department (HOSPITAL_COMMUNITY)

## 2023-12-30 ENCOUNTER — Other Ambulatory Visit: Payer: Self-pay

## 2023-12-30 ENCOUNTER — Inpatient Hospital Stay (HOSPITAL_COMMUNITY)

## 2023-12-30 DIAGNOSIS — Z7982 Long term (current) use of aspirin: Secondary | ICD-10-CM

## 2023-12-30 DIAGNOSIS — D696 Thrombocytopenia, unspecified: Secondary | ICD-10-CM | POA: Diagnosis not present

## 2023-12-30 DIAGNOSIS — R300 Dysuria: Secondary | ICD-10-CM | POA: Diagnosis not present

## 2023-12-30 DIAGNOSIS — R110 Nausea: Secondary | ICD-10-CM | POA: Diagnosis not present

## 2023-12-30 DIAGNOSIS — I10 Essential (primary) hypertension: Secondary | ICD-10-CM | POA: Diagnosis present

## 2023-12-30 DIAGNOSIS — R918 Other nonspecific abnormal finding of lung field: Secondary | ICD-10-CM | POA: Diagnosis not present

## 2023-12-30 DIAGNOSIS — D509 Iron deficiency anemia, unspecified: Secondary | ICD-10-CM | POA: Diagnosis not present

## 2023-12-30 DIAGNOSIS — Z1612 Extended spectrum beta lactamase (ESBL) resistance: Secondary | ICD-10-CM | POA: Diagnosis present

## 2023-12-30 DIAGNOSIS — B9620 Unspecified Escherichia coli [E. coli] as the cause of diseases classified elsewhere: Secondary | ICD-10-CM | POA: Diagnosis present

## 2023-12-30 DIAGNOSIS — K56609 Unspecified intestinal obstruction, unspecified as to partial versus complete obstruction: Secondary | ICD-10-CM | POA: Diagnosis not present

## 2023-12-30 DIAGNOSIS — Z87891 Personal history of nicotine dependence: Secondary | ICD-10-CM | POA: Diagnosis not present

## 2023-12-30 DIAGNOSIS — C7951 Secondary malignant neoplasm of bone: Secondary | ICD-10-CM | POA: Diagnosis not present

## 2023-12-30 DIAGNOSIS — R42 Dizziness and giddiness: Secondary | ICD-10-CM | POA: Diagnosis not present

## 2023-12-30 DIAGNOSIS — E860 Dehydration: Secondary | ICD-10-CM | POA: Diagnosis not present

## 2023-12-30 DIAGNOSIS — K567 Ileus, unspecified: Secondary | ICD-10-CM | POA: Diagnosis present

## 2023-12-30 DIAGNOSIS — Z882 Allergy status to sulfonamides status: Secondary | ICD-10-CM | POA: Diagnosis not present

## 2023-12-30 DIAGNOSIS — Y929 Unspecified place or not applicable: Secondary | ICD-10-CM

## 2023-12-30 DIAGNOSIS — R0902 Hypoxemia: Secondary | ICD-10-CM | POA: Diagnosis present

## 2023-12-30 DIAGNOSIS — K219 Gastro-esophageal reflux disease without esophagitis: Secondary | ICD-10-CM | POA: Diagnosis present

## 2023-12-30 DIAGNOSIS — R1030 Lower abdominal pain, unspecified: Secondary | ICD-10-CM | POA: Diagnosis not present

## 2023-12-30 DIAGNOSIS — Z8546 Personal history of malignant neoplasm of prostate: Secondary | ICD-10-CM

## 2023-12-30 DIAGNOSIS — C3490 Malignant neoplasm of unspecified part of unspecified bronchus or lung: Secondary | ICD-10-CM | POA: Diagnosis not present

## 2023-12-30 DIAGNOSIS — X58XXXA Exposure to other specified factors, initial encounter: Secondary | ICD-10-CM | POA: Diagnosis present

## 2023-12-30 DIAGNOSIS — K560 Paralytic ileus: Secondary | ICD-10-CM | POA: Diagnosis not present

## 2023-12-30 DIAGNOSIS — I951 Orthostatic hypotension: Secondary | ICD-10-CM | POA: Diagnosis present

## 2023-12-30 DIAGNOSIS — K5730 Diverticulosis of large intestine without perforation or abscess without bleeding: Secondary | ICD-10-CM | POA: Diagnosis present

## 2023-12-30 DIAGNOSIS — N390 Urinary tract infection, site not specified: Secondary | ICD-10-CM | POA: Diagnosis present

## 2023-12-30 DIAGNOSIS — N179 Acute kidney failure, unspecified: Secondary | ICD-10-CM | POA: Diagnosis not present

## 2023-12-30 DIAGNOSIS — R1031 Right lower quadrant pain: Secondary | ICD-10-CM | POA: Diagnosis not present

## 2023-12-30 DIAGNOSIS — Z923 Personal history of irradiation: Secondary | ICD-10-CM

## 2023-12-30 DIAGNOSIS — J90 Pleural effusion, not elsewhere classified: Secondary | ICD-10-CM | POA: Diagnosis present

## 2023-12-30 DIAGNOSIS — E785 Hyperlipidemia, unspecified: Secondary | ICD-10-CM | POA: Diagnosis not present

## 2023-12-30 DIAGNOSIS — K5669 Other partial intestinal obstruction: Secondary | ICD-10-CM | POA: Diagnosis not present

## 2023-12-30 DIAGNOSIS — Z79899 Other long term (current) drug therapy: Secondary | ICD-10-CM

## 2023-12-30 DIAGNOSIS — R109 Unspecified abdominal pain: Secondary | ICD-10-CM | POA: Diagnosis not present

## 2023-12-30 DIAGNOSIS — Z8042 Family history of malignant neoplasm of prostate: Secondary | ICD-10-CM

## 2023-12-30 DIAGNOSIS — K521 Toxic gastroenteritis and colitis: Secondary | ICD-10-CM | POA: Diagnosis not present

## 2023-12-30 DIAGNOSIS — Z4682 Encounter for fitting and adjustment of non-vascular catheter: Secondary | ICD-10-CM | POA: Diagnosis not present

## 2023-12-30 DIAGNOSIS — C3492 Malignant neoplasm of unspecified part of left bronchus or lung: Secondary | ICD-10-CM | POA: Diagnosis present

## 2023-12-30 DIAGNOSIS — R0600 Dyspnea, unspecified: Secondary | ICD-10-CM | POA: Diagnosis not present

## 2023-12-30 DIAGNOSIS — R0689 Other abnormalities of breathing: Secondary | ICD-10-CM | POA: Diagnosis not present

## 2023-12-30 DIAGNOSIS — K5750 Diverticulosis of both small and large intestine without perforation or abscess without bleeding: Secondary | ICD-10-CM | POA: Diagnosis not present

## 2023-12-30 DIAGNOSIS — M898X8 Other specified disorders of bone, other site: Secondary | ICD-10-CM | POA: Diagnosis not present

## 2023-12-30 DIAGNOSIS — R0989 Other specified symptoms and signs involving the circulatory and respiratory systems: Secondary | ICD-10-CM | POA: Diagnosis not present

## 2023-12-30 DIAGNOSIS — R140 Abdominal distension (gaseous): Secondary | ICD-10-CM | POA: Diagnosis not present

## 2023-12-30 DIAGNOSIS — T451X5A Adverse effect of antineoplastic and immunosuppressive drugs, initial encounter: Secondary | ICD-10-CM | POA: Diagnosis not present

## 2023-12-30 DIAGNOSIS — K5289 Other specified noninfective gastroenteritis and colitis: Secondary | ICD-10-CM | POA: Diagnosis not present

## 2023-12-30 DIAGNOSIS — K5731 Diverticulosis of large intestine without perforation or abscess with bleeding: Secondary | ICD-10-CM | POA: Diagnosis not present

## 2023-12-30 DIAGNOSIS — K529 Noninfective gastroenteritis and colitis, unspecified: Secondary | ICD-10-CM | POA: Diagnosis not present

## 2023-12-30 DIAGNOSIS — R933 Abnormal findings on diagnostic imaging of other parts of digestive tract: Secondary | ICD-10-CM | POA: Diagnosis not present

## 2023-12-30 DIAGNOSIS — D508 Other iron deficiency anemias: Secondary | ICD-10-CM | POA: Diagnosis not present

## 2023-12-30 DIAGNOSIS — K6389 Other specified diseases of intestine: Secondary | ICD-10-CM | POA: Diagnosis not present

## 2023-12-30 DIAGNOSIS — C801 Malignant (primary) neoplasm, unspecified: Secondary | ICD-10-CM | POA: Diagnosis not present

## 2023-12-30 LAB — CBC WITH DIFFERENTIAL/PLATELET
Abs Immature Granulocytes: 0.05 K/uL (ref 0.00–0.07)
Basophils Absolute: 0 K/uL (ref 0.0–0.1)
Basophils Relative: 0 %
Eosinophils Absolute: 0 K/uL (ref 0.0–0.5)
Eosinophils Relative: 0 %
HCT: 30.3 % — ABNORMAL LOW (ref 39.0–52.0)
Hemoglobin: 10 g/dL — ABNORMAL LOW (ref 13.0–17.0)
Immature Granulocytes: 1 %
Lymphocytes Relative: 4 %
Lymphs Abs: 0.3 K/uL — ABNORMAL LOW (ref 0.7–4.0)
MCH: 28.7 pg (ref 26.0–34.0)
MCHC: 33 g/dL (ref 30.0–36.0)
MCV: 87.1 fL (ref 80.0–100.0)
Monocytes Absolute: 0.6 K/uL (ref 0.1–1.0)
Monocytes Relative: 8 %
Neutro Abs: 7.1 K/uL (ref 1.7–7.7)
Neutrophils Relative %: 87 %
Platelets: 149 K/uL — ABNORMAL LOW (ref 150–400)
RBC: 3.48 MIL/uL — ABNORMAL LOW (ref 4.22–5.81)
RDW: 14.6 % (ref 11.5–15.5)
WBC: 8.2 K/uL (ref 4.0–10.5)
nRBC: 0 % (ref 0.0–0.2)

## 2023-12-30 LAB — COMPREHENSIVE METABOLIC PANEL WITH GFR
ALT: 18 U/L (ref 0–44)
AST: 33 U/L (ref 15–41)
Albumin: 3.5 g/dL (ref 3.5–5.0)
Alkaline Phosphatase: 62 U/L (ref 38–126)
Anion gap: 13 (ref 5–15)
BUN: 28 mg/dL — ABNORMAL HIGH (ref 8–23)
CO2: 22 mmol/L (ref 22–32)
Calcium: 9.4 mg/dL (ref 8.9–10.3)
Chloride: 100 mmol/L (ref 98–111)
Creatinine, Ser: 1.32 mg/dL — ABNORMAL HIGH (ref 0.61–1.24)
GFR, Estimated: 54 mL/min — ABNORMAL LOW (ref >60)
Glucose, Bld: 153 mg/dL — ABNORMAL HIGH (ref 70–99)
Potassium: 4.4 mmol/L (ref 3.5–5.1)
Sodium: 135 mmol/L (ref 135–145)
Total Bilirubin: 1 mg/dL (ref 0.0–1.2)
Total Protein: 7.3 g/dL (ref 6.5–8.1)

## 2023-12-30 LAB — I-STAT CHEM 8, ED
BUN: 25 mg/dL — ABNORMAL HIGH (ref 8–23)
Calcium, Ion: 1.16 mmol/L (ref 1.15–1.40)
Chloride: 100 mmol/L (ref 98–111)
Creatinine, Ser: 1.4 mg/dL — ABNORMAL HIGH (ref 0.61–1.24)
Glucose, Bld: 139 mg/dL — ABNORMAL HIGH (ref 70–99)
HCT: 27 % — ABNORMAL LOW (ref 39.0–52.0)
Hemoglobin: 9.2 g/dL — ABNORMAL LOW (ref 13.0–17.0)
Potassium: 4.2 mmol/L (ref 3.5–5.1)
Sodium: 136 mmol/L (ref 135–145)
TCO2: 25 mmol/L (ref 22–32)

## 2023-12-30 LAB — TYPE AND SCREEN
ABO/RH(D): A POS
Antibody Screen: NEGATIVE

## 2023-12-30 LAB — LIPASE, BLOOD: Lipase: 25 U/L (ref 11–51)

## 2023-12-30 LAB — PROTIME-INR
INR: 0.9 (ref 0.8–1.2)
Prothrombin Time: 12.4 s (ref 11.4–15.2)

## 2023-12-30 LAB — LACTIC ACID, PLASMA
Lactic Acid, Venous: 2.3 mmol/L (ref 0.5–1.9)
Lactic Acid, Venous: 1.2 mmol/L (ref 0.5–1.9)

## 2023-12-30 LAB — TROPONIN I (HIGH SENSITIVITY)
Troponin I (High Sensitivity): 5 ng/L (ref <18)
Troponin I (High Sensitivity): 6 ng/L (ref <18)

## 2023-12-30 LAB — BRAIN NATRIURETIC PEPTIDE: B Natriuretic Peptide: 80.1 pg/mL (ref 0.0–100.0)

## 2023-12-30 LAB — ABO/RH: ABO/RH(D): A POS

## 2023-12-30 LAB — POC OCCULT BLOOD, ED: Fecal Occult Bld: POSITIVE — AB

## 2023-12-30 MED ORDER — LACTATED RINGERS IV BOLUS: 1000.0000 mL | Freq: Once | INTRAVENOUS | Status: DC

## 2023-12-30 MED ORDER — OXYMETAZOLINE HCL 0.05 % NA SOLN
1.0000 | Freq: Once | NASAL | Status: AC
  Administered 2023-12-30: 1 via NASAL
  Filled 2023-12-30: qty 30

## 2023-12-30 MED ORDER — ONDANSETRON HCL 4 MG PO TABS: 4.0000 mg | ORAL_TABLET | Freq: Four times a day (QID) | ORAL | Status: DC | PRN

## 2023-12-30 MED ORDER — ACETAMINOPHEN 650 MG RE SUPP: 650.0000 mg | Freq: Four times a day (QID) | RECTAL | Status: DC | PRN

## 2023-12-30 MED ORDER — SODIUM CHLORIDE 0.9 % IV BOLUS
1000.0000 mL | Freq: Once | INTRAVENOUS | Status: AC
  Administered 2023-12-30: 1000 mL via INTRAVENOUS

## 2023-12-30 MED ORDER — ALBUTEROL SULFATE (2.5 MG/3ML) 0.083% IN NEBU: 2.5000 mg | INHALATION_SOLUTION | RESPIRATORY_TRACT | Status: DC | PRN

## 2023-12-30 MED ORDER — FENTANYL CITRATE PF 50 MCG/ML IJ SOSY
50.0000 ug | PREFILLED_SYRINGE | Freq: Once | INTRAMUSCULAR | Status: AC
  Administered 2023-12-30: 50 ug via INTRAVENOUS
  Filled 2023-12-30: qty 1

## 2023-12-30 MED ORDER — ONDANSETRON HCL 4 MG/2ML IJ SOLN
4.0000 mg | Freq: Once | INTRAMUSCULAR | Status: AC
  Administered 2023-12-30: 4 mg via INTRAVENOUS
  Filled 2023-12-30: qty 2

## 2023-12-30 MED ORDER — LACTATED RINGERS IV SOLN: INTRAVENOUS | Status: AC

## 2023-12-30 MED ORDER — LIDOCAINE HCL URETHRAL/MUCOSAL 2 % EX GEL
1.0000 | Freq: Once | CUTANEOUS | Status: AC
  Administered 2023-12-30: 1
  Filled 2023-12-30: qty 11

## 2023-12-30 MED ORDER — PHENOL 1.4 % MT LIQD
1.0000 | OROMUCOSAL | Status: DC | PRN
  Filled 2023-12-30 (×2): qty 177

## 2023-12-30 MED ORDER — HYDRALAZINE HCL 20 MG/ML IJ SOLN
5.0000 mg | Freq: Four times a day (QID) | INTRAMUSCULAR | Status: DC | PRN
  Administered 2024-01-07: 5 mg via INTRAVENOUS
  Filled 2023-12-30: qty 1

## 2023-12-30 MED ORDER — HEPARIN SODIUM (PORCINE) 5000 UNIT/ML IJ SOLN
5000.0000 [IU] | Freq: Three times a day (TID) | INTRAMUSCULAR | Status: DC
  Administered 2023-12-30 – 2023-12-31 (×4): 5000 [IU] via SUBCUTANEOUS
  Filled 2023-12-30 (×4): qty 1

## 2023-12-30 MED ORDER — PANTOPRAZOLE SODIUM 40 MG IV SOLR
40.0000 mg | Freq: Once | INTRAVENOUS | Status: AC
  Administered 2023-12-30: 40 mg via INTRAVENOUS
  Filled 2023-12-30: qty 10

## 2023-12-30 MED ORDER — SODIUM CHLORIDE 0.9 % IV SOLN: 12.5000 mg | Freq: Four times a day (QID) | INTRAVENOUS | Status: DC | PRN

## 2023-12-30 MED ORDER — LORAZEPAM 2 MG/ML IJ SOLN
1.0000 mg | Freq: Four times a day (QID) | INTRAMUSCULAR | Status: DC | PRN
  Administered 2023-12-30: 1 mg via INTRAVENOUS
  Filled 2023-12-30: qty 1

## 2023-12-30 MED ORDER — ONDANSETRON HCL 4 MG/2ML IJ SOLN
4.0000 mg | Freq: Four times a day (QID) | INTRAMUSCULAR | Status: DC | PRN
  Administered 2023-12-30 – 2024-01-04 (×9): 4 mg via INTRAVENOUS
  Filled 2023-12-30 (×9): qty 2

## 2023-12-30 MED ORDER — BENZOCAINE 20 % MT AERO
INHALATION_SPRAY | Freq: Once | OROMUCOSAL | Status: AC
  Filled 2023-12-30: qty 57

## 2023-12-30 MED ORDER — HYDROMORPHONE HCL 1 MG/ML IJ SOLN
0.5000 mg | INTRAMUSCULAR | Status: DC | PRN
  Administered 2023-12-30 – 2023-12-31 (×4): 1 mg via INTRAVENOUS
  Administered 2023-12-31: 0.5 mg via INTRAVENOUS
  Administered 2024-01-01 – 2024-01-03 (×12): 1 mg via INTRAVENOUS
  Filled 2023-12-30 (×17): qty 1

## 2023-12-30 MED ORDER — IOHEXOL 350 MG/ML SOLN
100.0000 mL | Freq: Once | INTRAVENOUS | Status: AC | PRN
  Administered 2023-12-30: 100 mL via INTRAVENOUS

## 2023-12-30 MED ORDER — ACETAMINOPHEN 325 MG PO TABS
650.0000 mg | ORAL_TABLET | Freq: Four times a day (QID) | ORAL | Status: DC | PRN
  Administered 2024-01-06 (×2): 650 mg via ORAL
  Filled 2023-12-30 (×2): qty 2

## 2023-12-30 NOTE — ED Triage Notes (Signed)
 Complaining of abdominal pain that started a couple of days ago. Today he is distended in the abdomen and is short of breath. Has cancer in lungs, prostate, spine, and lymphnodes. Did vomit 2 times.

## 2023-12-30 NOTE — ED Notes (Addendum)
 Writer was given report and informed there had been multiple NG tube attempts. Pt is resting right now to give him a break. According to report given to writer Dr. Carita will be talking with surgery to place NG tube due to difficulty in the ER.

## 2023-12-30 NOTE — Hospital Course (Signed)
 Chad Moreno is a 83 year old man medical history of metastatic small cell lung cancer s/p SRB [place] completed 10/2022 and currently onencorafenib and binimetinib  started on 06/2023, chronic thrombocytopenia, essential hypertension and GERD.  Presented and complaining of diffuse abdominal pain for 1 day, abdominal discomfort, distention, nausea and vomiting x 2.  Also reported several piece of dark and loose stool.  Concern for cecal colitis and bowel obstruction.   ED:  patient is borderline hypotensive. Initial lactic acid 2.3 improved to 1.2. CBC normal WBC count, stable H&H 10.3 and 30 and low platelet count at baseline.  CMP showing elevated creatinine 1.32 otherwise unremarkable. Troponin x 2 within normal range.  Normal BNP. Patient is FOBT positive however hemoglobin is stable.  X-ray abdomen show gaseous distention of the small bowel measuring up to 4.2 cm compatible with small bowel obstruction.Lucency under the left hemidiaphragm is probably related to gaseous distention of the stomach . Free gas under the left hemidiaphragm is considered less likely but not excluded. This could also be further assessed at the time of CT.  CTA: CT abd/ Pelvis :  no PE, New wall thickening and inflammatory fat stranding about the cecum, consistent with nonspecific infectious or inflammatory colitis. 3. Mildly distended, fluid-filled loops of mid to distal small bowel throughout the abdomen, largest, distal loops measuring up to 4.1 cm in caliber. Diminished volume of a left pleural effusion   Unchanged sclerotic osseous metastases of the posterior right ninth rib and T9 vertebral body. Unchanged sclerotic osseous metastases of the left sacrum and ilium  - Received 1 L of LR bolus, IV Protonix  and Zofran . NG tube has been ordered by ED physician. Dr. Carita consulted and sent secure chat message to general surgery and [facility] as well.  Hospitalist has been consulted for further evaluation  management of small bowel obstruction, AKI and GI bleed.     Assessment & Plan:   Principal Problem:   SBO (small bowel obstruction) (HCC) Active Problems:   Non-small cell cancer of left lung (HCC)   Metastasis to bone (HCC)  Bowel obstruction - CT abdomen reviewed,New wall thickening and inflammatory fat stranding about the cecum, consistent with nonspecific infectious or inflammatory colitis. 3. Mildly distended, fluid-filled loops of mid to distal small bowel throughout the abdomen, largest, distal loops measuring up to 4.1 cm in caliber.  -S/P NG tube placement  = to wall suction - Improved nausea, no vomiting  -IV fluid -N.p.o. -Hold home aspirin -Pain and nausea medication as needed - Consulted [facility] Dr. Rosalie, general surgery Dr. Chaya   Possible GI bleed/acute on chronic anemia - Monitoring H&H closely - Active signs of bleeding - Pending Hemoccult - Initiating IV Protonix     Latest Ref Rng & Units 12/31/2023    4:40 AM 12/30/2023    5:38 AM 12/30/2023    5:00 AM  CBC  WBC 4.0 - 10.5 K/uL 5.1   8.2   Hemoglobin 13.0 - 17.0 g/dL 9.0  9.2  89.9   Hematocrit 39.0 - 52.0 % 28.1  27.0  30.3   Platelets 150 - 400 K/uL 124   149       Non-small cell lung cancer -hold immunotherapy, his oncologist Dr. [name] added to inpatient treatment team   Hypertension -NPO- hold home amlodipine and losartan while n.p.o., IV hydralazine  as needed in the meantime   AKI -Improving -in the setting of baseline normal renal function, likely due to vomiting and volume loss. -Improved -Hold nephrotoxins including  losartan -IV fluids -Monitor renal function with daily labs Lab Results  Component Value Date   CREATININE 1.22 12/31/2023   CREATININE 1.40 (H) 12/30/2023   CREATININE 1.32 (H) 12/30/2023

## 2023-12-30 NOTE — ED Provider Notes (Signed)
 [place] EMERGENCY DEPARTMENT AT [facility] Provider Note   CSN: [id number] Arrival date & time: 12/30/23  [id number]     Patient presents with: Abdominal Pain   Chad Moreno is a 83 y.o. male.   Patient with a history of metastatic lung cancer, hypertension, hyperlipidemia presents with diffuse abdominal pain that started about 1 day ago.  Describes diffuse abdominal distention with abdominal pain and discomfort.  Nausea and vomited twice.  Has had several dark stools today that have been loose.  Does not believe he has been constipated.  No fever.  No chest pain.  Does have chronic shortness of breath which is a little bit worse than usual.  States he is due to have some kind of procedure to paralyze a rib next week for pain control.  He has never had this abdominal pain or distention before.  No previous abdominal surgeries.  No fever.  Vomiting x 2.  Stools have been dark and loose.  Does not take any blood thinners.  The history is provided by the patient.  Abdominal Pain Associated symptoms: fatigue, nausea, shortness of breath and vomiting   Associated symptoms: no cough, no dysuria, no fever and no hematuria        Prior to Admission medications   Medication Sig Start Date End Date Taking? Authorizing Provider  amLODipine (NORVASC) 2.5 MG tablet Take 2.5 mg by mouth in the morning. 02/07/19   [provider]  Ascorbic Acid (VITAMIN C PO) Take 1 tablet by mouth daily with breakfast.    [provider]  aspirin EC 81 MG tablet Take 81 mg by mouth in the morning.    [provider]  B Complex Vitamins (VITAMIN-B COMPLEX) TABS Take 1 tablet by mouth daily with breakfast.    [provider]  binimetinib  (MEKTOVI ) 15 MG tablet Take 3 tablets (45 mg total) by mouth 2 (two) times daily. 11/06/23   [name] [name], MD  calcium carbonate (OSCAL) 1500 (600 Ca) MG TABS tablet Take 1,200 mg by mouth 2 (two) times daily with a meal.    [provider]  celecoxib  (CELEBREX ) 200 MG capsule Take 1 capsule (200 mg total) by mouth 2 (two) times daily. 12/03/23   Pickenpack-Cousar, Athena N, NP  Cholecalciferol (VITAMIN D3) 50 MCG (2000 UT) TABS Take 2,000 Units by mouth daily with breakfast.    [provider]  cyanocobalamin (VITAMIN B12) 1000 MCG tablet Take 1,000 mcg by mouth daily.    [provider]  encorafenib  (BRAFTOVI ) 75 MG capsule Take 6 capsules (450 mg total) by mouth daily. 11/06/23   [name] [name], MD  ferrous sulfate  325 (65 FE) MG EC tablet Take 1 tablet (325 mg total) by mouth daily as needed. 12/03/23   Pickenpack-Cousar, Athena N, NP  fluticasone (FLONASE) 50 MCG/ACT nasal spray Place 2 sprays into both nostrils daily.    [provider]  losartan (COZAAR) 100 MG tablet Take 50 mg by mouth daily. 02/07/19   [provider]  Omega-3 Fatty Acids (FISH OIL PO) Take 1 capsule by mouth daily with breakfast.    [provider]  oxyCODONE -acetaminophen  (PERCOCET) 7.5-325 MG tablet Take 1 tablet by mouth every 6 (six) hours as needed for severe pain (pain score 7-10). 12/18/23   Pickenpack-Cousar, Fannie SAILOR, NP  POTASSIUM PO Take 1 tablet by mouth daily with breakfast.    [provider]  Probiotic Product (PROBIOTIC PEARLS ADVANTAGE PO) Take 1 capsule by mouth in the  morning.    [provider]  silodosin  (RAPAFLO ) 8 MG CAPS capsule Take 8 mg by mouth daily. 07/04/23   [provider]    Allergies: Sulfa antibiotics    Review of Systems  Constitutional:  Positive for activity change, appetite change and fatigue. Negative for fever.  HENT:  Negative for congestion.   Respiratory:  Positive for shortness of breath. Negative for cough and chest tightness.   Gastrointestinal:  Positive for abdominal pain, blood in stool, nausea and vomiting.  Genitourinary:  Negative for dysuria and hematuria.  Musculoskeletal:  Negative for arthralgias and myalgias.   Skin:  Negative for rash.  Neurological:  Positive for weakness and light-headedness.    all other systems are negative except as noted in the HPI and PMH.   Updated Vital Signs BP 131/75 (BP Location: Right Arm)   Pulse 92   Temp 97.8 F (36.6 C) (Oral)   Resp 16   Ht 6' (1.829 m)   Wt 95.3 kg   SpO2 100%   BMI 28.48 kg/m   Physical Exam Vitals and nursing note reviewed.  Constitutional:      General: He is not in acute distress.    Appearance: He is well-developed.     Comments: Chronically ill-appearing, pale appearing, appears short of breath  HENT:     Head: Normocephalic and atraumatic.     Mouth/Throat:     Pharynx: No oropharyngeal exudate.  Eyes:     Conjunctiva/sclera: Conjunctivae normal.     Pupils: Pupils are equal, round, and reactive to light.     Comments: Pale conjunctiva  Neck:     Comments: No meningismus. Cardiovascular:     Rate and Rhythm: Normal rate and regular rhythm.     Heart sounds: Normal heart sounds. No murmur heard. Pulmonary:     Effort: Pulmonary effort is normal. No respiratory distress.     Breath sounds: Normal breath sounds.  Abdominal:     Palpations: Abdomen is soft.     Tenderness: There is abdominal tenderness. There is no guarding or rebound.     Comments: Distended abdomen, diffusely tender, voluntary guarding throughout  Genitourinary:    Comments: Dark stool that is Hemoccult positive Musculoskeletal:        General: No tenderness. Normal range of motion.     Cervical back: Normal range of motion and neck supple.  Skin:    General: Skin is warm.  Neurological:     Mental Status: He is alert and oriented to person, place, and time.     Cranial Nerves: No cranial nerve deficit.     Motor: No abnormal muscle tone.     Coordination: Coordination normal.     Comments: No ataxia on finger to nose bilaterally. No pronator drift. 5/5 strength throughout. CN 2-12 intact.Equal grip strength. Sensation intact.   Psychiatric:         Behavior: Behavior normal.     (all labs ordered are listed, but only abnormal results are displayed) Labs Reviewed  LACTIC ACID, PLASMA - Abnormal; Notable for the following components:      Result Value   Lactic Acid, Venous 2.3 (*)    All other components within normal limits  COMPREHENSIVE METABOLIC PANEL WITH GFR - Abnormal; Notable for the following components:   Glucose, Bld 153 (*)    BUN 28 (*)    Creatinine, Ser 1.32 (*)    GFR, Estimated 54 (*)    All other components within normal limits  CBC WITH DIFFERENTIAL/PLATELET - Abnormal; Notable for the following components:   RBC 3.48 (*)    Hemoglobin 10.0 (*)    HCT 30.3 (*)    Platelets 149 (*)    Lymphs Abs 0.3 (*)    All other components within normal limits  POC OCCULT BLOOD, ED - Abnormal; Notable for the following components:   Fecal Occult Bld POSITIVE (*)    All other components within normal limits  I-STAT CHEM 8, ED - Abnormal; Notable for the following components:   BUN 25 (*)    Creatinine, Ser 1.40 (*)    Glucose, Bld 139 (*)    Hemoglobin 9.2 (*)    HCT 27.0 (*)    All other components within normal limits  CULTURE, BLOOD (ROUTINE X 2)  CULTURE, BLOOD (ROUTINE X 2)  PROTIME-INR  LACTIC ACID, PLASMA  LIPASE, BLOOD  BRAIN NATRIURETIC PEPTIDE  CBC WITH DIFFERENTIAL/PLATELET  TYPE AND SCREEN  ABO/RH  TROPONIN I (HIGH SENSITIVITY)  TROPONIN I (HIGH SENSITIVITY)    EKG: None  Radiology: CT Angio Chest PE W and/or Wo Contrast Result Date: 12/30/2023 CLINICAL DATA:  PE suspected, abdominal pain, non-small-cell lung cancer * Tracking Code: BO * EXAM: CT ANGIOGRAPHY CHEST CT ABDOMEN AND PELVIS WITH CONTRAST TECHNIQUE: Multidetector CT imaging of the chest was performed using the standard protocol during bolus administration of intravenous contrast. Multiplanar CT image reconstructions and MIPs were obtained to evaluate the vascular anatomy. Multidetector CT imaging of the abdomen and pelvis was  performed using the standard protocol during bolus administration of intravenous contrast. RADIATION DOSE REDUCTION: This exam was performed according to the departmental dose-optimization program which includes automated exposure control, adjustment of the mA and/or kV according to patient size and/or use of iterative reconstruction technique. CONTRAST:  OMNIPAQUE  IOHEXOL  350 MG/ML SOLN COMPARISON:  CT chest abdomen pelvis, 12/11/2023 FINDINGS: CT CHEST ANGIOGRAM FINDINGS Cardiovascular: Satisfactory opacification of the pulmonary arteries to the segmental level. No evidence of pulmonary embolism. Mild cardiomegaly. Three-vessel coronary artery calcifications. No pericardial effusion. Aortic atherosclerosis. Mediastinum/Nodes: No enlarged mediastinal, hilar, or axillary lymph nodes. Thyroid  gland, trachea, and esophagus demonstrate no significant findings. Lungs/Pleura: Diminished volume of a left pleural effusion. Otherwise unchanged findings when compared to recent restaging examination with treated masses in the left upper lobe and right lower lobe. Musculoskeletal: No chest wall abnormality. No acute osseous findings. Nondisplaced, subacute pathologic fracture of the posterior left third rib (series 4, image 36). Review of the MIP images confirms the above findings. CT ABDOMEN PELVIS FINDINGS Hepatobiliary: No solid liver abnormality is seen. No gallstones, gallbladder wall thickening, or biliary dilatation. Pancreas: Unremarkable. No pancreatic ductal dilatation or surrounding inflammatory changes. Spleen: Normal in size without significant abnormality. Adrenals/Urinary Tract: Adrenal glands are unremarkable. Kidneys are normal, without renal calculi, solid lesion, or hydronephrosis. Bladder is unremarkable. Stomach/Bowel: Stomach is within normal limits. New wall thickening and inflammatory fat stranding about the cecum (series 2, image 47, series 8, image 93). Mildly distended, fluid-filled loops of  mid to distal small bowel throughout the abdomen, largest, distal loops measuring up to 4.1 cm in caliber. The terminal ileum is thickened and fecalized (series 2, image 49). Colon is generally decompressed of the rectum with scattered gas and stool throughout. Sigmoid diverticulosis. Vascular/Lymphatic: Aortic atherosclerosis. No enlarged abdominal or pelvic lymph nodes. Reproductive: Prostatomegaly with biopsy marking clips and fiducials. Other: Small fat containing left inguinal hernia.  No ascites. Musculoskeletal: No acute osseous findings. Unchanged sclerotic metastases of the posterior right ninth  rib and T9 vertebral body. Unchanged sclerotic osseous metastases of the left sacrum and ilium. IMPRESSION: 1. Negative examination for pulmonary embolism. 2. New wall thickening and inflammatory fat stranding about the cecum, consistent with nonspecific infectious or inflammatory colitis. 3. Mildly distended, fluid-filled loops of mid to distal small bowel throughout the abdomen, largest, distal loops measuring up to 4.1 cm in caliber. The terminal ileum is thickened and fecalized. Findings suggest obstipation of the ileocecal valve secondary to colitis and/or associated infectious or inflammatory ileus. 4. Diminished volume of a left pleural effusion. Otherwise unchanged oncologic findings when compared to recent restaging examination with treated masses in the left upper lobe and right lower lobe. 5. Unchanged sclerotic osseous metastases of the posterior right ninth rib and T9 vertebral body. Unchanged sclerotic osseous metastases of the left sacrum and ilium. 6. Coronary artery disease. Aortic Atherosclerosis (ICD10-I70.0). Electronically Signed   By: Marolyn JONETTA Jaksch M.D.   On: 12/30/2023 06:51   CT ABDOMEN PELVIS W CONTRAST Result Date: 12/30/2023 CLINICAL DATA:  PE suspected, abdominal pain, non-small-cell lung cancer * Tracking Code: BO * EXAM: CT ANGIOGRAPHY CHEST CT ABDOMEN AND PELVIS WITH CONTRAST  TECHNIQUE: Multidetector CT imaging of the chest was performed using the standard protocol during bolus administration of intravenous contrast. Multiplanar CT image reconstructions and MIPs were obtained to evaluate the vascular anatomy. Multidetector CT imaging of the abdomen and pelvis was performed using the standard protocol during bolus administration of intravenous contrast. RADIATION DOSE REDUCTION: This exam was performed according to the departmental dose-optimization program which includes automated exposure control, adjustment of the mA and/or kV according to patient size and/or use of iterative reconstruction technique. CONTRAST:  OMNIPAQUE  IOHEXOL  350 MG/ML SOLN COMPARISON:  CT chest abdomen pelvis, 12/11/2023 FINDINGS: CT CHEST ANGIOGRAM FINDINGS Cardiovascular: Satisfactory opacification of the pulmonary arteries to the segmental level. No evidence of pulmonary embolism. Mild cardiomegaly. Three-vessel coronary artery calcifications. No pericardial effusion. Aortic atherosclerosis. Mediastinum/Nodes: No enlarged mediastinal, hilar, or axillary lymph nodes. Thyroid  gland, trachea, and esophagus demonstrate no significant findings. Lungs/Pleura: Diminished volume of a left pleural effusion. Otherwise unchanged findings when compared to recent restaging examination with treated masses in the left upper lobe and right lower lobe. Musculoskeletal: No chest wall abnormality. No acute osseous findings. Nondisplaced, subacute pathologic fracture of the posterior left third rib (series 4, image 36). Review of the MIP images confirms the above findings. CT ABDOMEN PELVIS FINDINGS Hepatobiliary: No solid liver abnormality is seen. No gallstones, gallbladder wall thickening, or biliary dilatation. Pancreas: Unremarkable. No pancreatic ductal dilatation or surrounding inflammatory changes. Spleen: Normal in size without significant abnormality. Adrenals/Urinary Tract: Adrenal glands are unremarkable. Kidneys  are normal, without renal calculi, solid lesion, or hydronephrosis. Bladder is unremarkable. Stomach/Bowel: Stomach is within normal limits. New wall thickening and inflammatory fat stranding about the cecum (series 2, image 47, series 8, image 93). Mildly distended, fluid-filled loops of mid to distal small bowel throughout the abdomen, largest, distal loops measuring up to 4.1 cm in caliber. The terminal ileum is thickened and fecalized (series 2, image 49). Colon is generally decompressed of the rectum with scattered gas and stool throughout. Sigmoid diverticulosis. Vascular/Lymphatic: Aortic atherosclerosis. No enlarged abdominal or pelvic lymph nodes. Reproductive: Prostatomegaly with biopsy marking clips and fiducials. Other: Small fat containing left inguinal hernia.  No ascites. Musculoskeletal: No acute osseous findings. Unchanged sclerotic metastases of the posterior right ninth rib and T9 vertebral body. Unchanged sclerotic osseous metastases of the left sacrum and ilium. IMPRESSION: 1. Negative examination  for pulmonary embolism. 2. New wall thickening and inflammatory fat stranding about the cecum, consistent with nonspecific infectious or inflammatory colitis. 3. Mildly distended, fluid-filled loops of mid to distal small bowel throughout the abdomen, largest, distal loops measuring up to 4.1 cm in caliber. The terminal ileum is thickened and fecalized. Findings suggest obstipation of the ileocecal valve secondary to colitis and/or associated infectious or inflammatory ileus. 4. Diminished volume of a left pleural effusion. Otherwise unchanged oncologic findings when compared to recent restaging examination with treated masses in the left upper lobe and right lower lobe. 5. Unchanged sclerotic osseous metastases of the posterior right ninth rib and T9 vertebral body. Unchanged sclerotic osseous metastases of the left sacrum and ilium. 6. Coronary artery disease. Aortic Atherosclerosis (ICD10-I70.0).  Electronically Signed   By: Marolyn JONETTA Jaksch M.D.   On: 12/30/2023 06:51   DG ABD ACUTE 2+V W 1V CHEST Result Date: 12/30/2023 CLINICAL DATA:  Abdominal pain. EXAM: DG ABDOMEN ACUTE WITH 1 VIEW CHEST COMPARISON:  Chest abdomen pelvis CT 12/11/2023 FINDINGS: Low lung volumes. Ill-defined opacities are seen in the right parahilar and left mid lungs with associated fiducial markers. No pulmonary edema or substantial pleural effusion. The cardio pericardial silhouette is enlarged. No acute bony abnormality. Supine abdomen shows gaseous distention of small bowel in the central abdomen measuring up to 4.2 cm diameter. Lucency under the left hemidiaphragm is probably related to gaseous distention of the stomach . Free air under the left hemidiaphragm is considered less likely but not excluded. Degenerative changes are seen in the lumbar spine. IMPRESSION: 1. Gaseous distention of small bowel in the central abdomen measuring up to 4.2 cm diameter. Imaging features are compatible with small bowel obstruction. CT abdomen and pelvis with oral and intravenous contrast could be used to further evaluate. 2. Lucency under the left hemidiaphragm is probably related to gaseous distention of the stomach . Free gas under the left hemidiaphragm is considered less likely but not excluded. This could also be further assessed at the time of CT. Electronically Signed   By: Camellia Candle M.D.   On: 12/30/2023 05:33     Procedures   Medications Ordered in the ED - No data to display                                  Medical Decision Making Amount and/or Complexity of Data Reviewed Labs: ordered. Decision-making details documented in ED Course. Radiology: ordered and independent interpretation performed. Decision-making details documented in ED Course. ECG/medicine tests: ordered and independent interpretation performed. Decision-making details documented in ED Course.  Risk OTC drugs. Prescription drug management. Decision  regarding hospitalization.   Abdominal pain x 1 day associated with nausea and vomiting and dark stools.  Stable vitals on arrival.  Abdomen soft but diffusely tender.  Is Hemoccult positive.  Does appear very pale and short of breath.  Initiate IV access x 2.  Will give IV fluids and IV Protonix  and check labs.  Hemoglobin stable at 10.  Hemoccult is positive but low suspicion for significant GI bleed. Lactate minimally elevated 2.3.  He is given IV fluids pain and nausea medication.  Mild AKI.  LFTs and lipase are normal.  Abdominal x-ray is concerning for likely small bowel obstruction.  Also questionable free air under left diaphragm but more likely stomach distention.  Will obtain CT scan.  NG tube to be placed.  NG tube placement not successful  in multiple attempts.  Patient requesting no further attempts at this time. Will continue IV hydration.  Sent for CT scan which is pending. Admission discussed with Dr. Lee of hospitalist service.  Message sent to gastroenterology as well as general surgery for further evaluation of patient.     Final diagnoses:  Small bowel obstruction [facility])    ED Discharge Orders     None          Carita Senior, MD 12/30/23 [phone]

## 2023-12-30 NOTE — Consult Note (Signed)
 Chad Moreno 13-Jun-1941  [id number].    Requesting MD: Mir Zella Chief Complaint/Reason for Consult: Abdominal pain/SBO/Cecal colitis   HPI: Chad Moreno is a 83 y.o. male with PMH of prostate cancer, lung cancer and HTN who presents with abdominal pain with nausea and emesis that started approximately Sunday morning. Patient stated that he noticed the abdominal pain first then emesis followed. Patient denies diarrhea/constipation. Denies sick contacts or family history of UC or crohn's disease. Had hx of pelvic radiation therapy d/t prostate CA about a year ago but has not had any abdominal surgeries. After multiple bouts of emesis, he notices some light headedness. At this point, patient called EMS and was brought to ED and received further work up including a CT scan which showed cecal colitis and distended small bowel and stomach.   Past Medical History: As below Prior Abdominal Surgeries: None  Blood Thinners: None Last PO intake: Yesterday  Last Colonoscopy: 5-10 years ago, by report-negative    ROS: See HPI  Family History  Problem Relation Age of Onset   Prostate cancer Father     Past Medical History:  Diagnosis Date   Age-related cataract of left eye    Amblyopia, right eye    Arthritis    Cancer (HCC)    Dysuria    ED (erectile dysfunction)    Elevated PSA    Epidermal cyst    Family history of prostate cancer    Gout    Hip pain    Histoplasmosis    Hyperlipidemia    Hypermetropia, left eye    Hypertension    Impaired glucose tolerance    Joint pain    Left shoulder pain    Lower back pain    Melanocytic nevi, unspecified    Nummular dermatitis    Ocular hypertension    Palmar fascial fibromatosis    Prostate cancer screening    Regular astigmatism, right eye    Senile nuclear sclerosis     Past Surgical History:  Procedure Laterality Date   BRONCHIAL BIOPSY  08/27/2022   Procedure: BRONCHIAL BIOPSIES;  Surgeon: Shelah Lamar RAMAN, MD;   Location: [facility];  Service: Pulmonary;;   BRONCHIAL BRUSHINGS  08/27/2022   Procedure: BRONCHIAL BRUSHINGS;  Surgeon: Shelah Lamar RAMAN, MD;  Location: [facility];  Service: Pulmonary;;   BRONCHIAL NEEDLE ASPIRATION BIOPSY  08/27/2022   Procedure: BRONCHIAL NEEDLE ASPIRATION BIOPSIES;  Surgeon: Shelah Lamar RAMAN, MD;  Location: [facility];  Service: Pulmonary;;   CONSTRICTING FINGER RING EXCISION Left    CYST REMOVAL TRUNK Left    left posterior shoulder   FIDUCIAL MARKER PLACEMENT  08/27/2022   Procedure: FIDUCIAL MARKER PLACEMENT;  Surgeon: Shelah Lamar RAMAN, MD;  Location: [facility];  Service: Pulmonary;;   GOLD SEED IMPLANT N/A 10/02/2022   Procedure: GOLD SEED IMPLANT;  Surgeon: Lovie Arlyss CROME, MD;  Location: [facility];  Service: Urology;  Laterality: N/A;   index finger Left    IR THORACENTESIS ASP PLEURAL SPACE W/IMG GUIDE  06/14/2023   Prostate needle biopsy     SPACE OAR INSTILLATION N/A 10/02/2022   Procedure: SPACE OAR INSTILLATION;  Surgeon: Lovie Arlyss CROME, MD;  Location: [facility];  Service: Urology;  Laterality: N/A;    Social History:  reports that he quit smoking about 44 years ago. His smoking use included cigarettes. He started smoking about 54 years ago. He has a 5 pack-year smoking history. He has never used smokeless  tobacco. He reports current alcohol  use. He reports that he does not use drugs.  Allergies:  Allergies  Allergen Reactions   Sulfa Antibiotics Anaphylaxis    Medications Prior to Admission  Medication Sig Dispense Refill   amLODipine (NORVASC) 2.5 MG tablet Take 2.5 mg by mouth in the morning.     Ascorbic Acid (VITAMIN C PO) Take 1 tablet by mouth daily with breakfast.     aspirin EC 81 MG tablet Take 81 mg by mouth in the morning.     B Complex Vitamins (VITAMIN-B COMPLEX) TABS Take 1 tablet by mouth daily with breakfast.     binimetinib  (MEKTOVI ) 15 MG tablet Take 3 tablets (45 mg total) by mouth 2 (two) times  daily. 180 tablet 3   calcium carbonate (OSCAL) 1500 (600 Ca) MG TABS tablet Take 1,200 mg by mouth 2 (two) times daily with a meal.     celecoxib  (CELEBREX ) 200 MG capsule Take 1 capsule (200 mg total) by mouth 2 (two) times daily. 60 capsule 2   Cholecalciferol (VITAMIN D3) 50 MCG (2000 UT) TABS Take 2,000 Units by mouth daily with breakfast.     cyanocobalamin (VITAMIN B12) 1000 MCG tablet Take 1,000 mcg by mouth daily.     encorafenib  (BRAFTOVI ) 75 MG capsule Take 6 capsules (450 mg total) by mouth daily. 180 capsule 3   ferrous sulfate  325 (65 FE) MG EC tablet Take 1 tablet (325 mg total) by mouth daily as needed. (Patient taking differently: Take 325 mg by mouth every other day.)     losartan (COZAAR) 100 MG tablet Take 50 mg by mouth daily.     Omega-3 Fatty Acids (FISH OIL PO) Take 1 capsule by mouth daily with breakfast.     oxyCODONE -acetaminophen  (PERCOCET) 7.5-325 MG tablet Take 1 tablet by mouth every 6 (six) hours as needed for severe pain (pain score 7-10). 60 tablet 0   POTASSIUM PO Take 1 tablet by mouth daily with breakfast.     Probiotic Product (PROBIOTIC PEARLS ADVANTAGE PO) Take 1 capsule by mouth in the morning.     silodosin  (RAPAFLO ) 8 MG CAPS capsule Take 8 mg by mouth daily.       Physical Exam: Blood pressure (!) 144/89, pulse 81, temperature 98.7 F (37.1 C), temperature source Oral, resp. rate 16, height 6' (1.829 m), weight 95.3 kg, SpO2 100%.  General: pleasant, WD, WN white male who is laying in bed in NAD HEENT: head is normocephalic, atraumatic.  Sclera are noninjected.  PERRL.  Ears and nose without any masses or lesions. Mouth is pink and dry Heart: regular, rate, and rhythm. Normal S1/S2. No obvious murmurs, gallops, or rubs noted. Lungs: CTAB, no wheezes, rhonchi, or rales noted. Respiratory effort nonlabored Abd: Distended but soft with tympany in upper abdomen, +BS, no masses, hernias, or organomegaly, tender in RLQ, mild in LLQ Psych: A&Ox3 with an  appropriate affect.   Results for orders placed or performed during the hospital encounter of 12/30/23 (from the past 48 hours)  POC occult blood, ED     Status: Abnormal   Collection Time: 12/30/23  3:39 AM  Result Value Ref Range   Fecal Occult Bld POSITIVE (A) NEGATIVE  [facility] and screen  [facility]     Status: None   Collection Time: 12/30/23  3:46 AM  Result Value Ref Range   ABO/RH(D) A POS    Antibody Screen NEG    Sample Expiration      01/02/2024,2359 Performed at  [facility], [address] [place], [place] [address]   Lactic acid, plasma     Status: Abnormal   Collection Time: 12/30/23  3:59 AM  Result Value Ref Range   Lactic Acid, Venous 2.3 (HH) 0.5 - 1.9 mmol/L    Comment: CRITICAL RESULT CALLED TO, READ BACK BY AND VERIFIED WITH MARRICKS, L. RN AT 12/30/2023 0451 BY JEREMY C Performed at [facility], [address] [place], [place] [address]   Blood culture (routine x 2)     Status: None (Preliminary result)   Collection Time: 12/30/23  4:00 AM   Specimen: BLOOD  Result Value Ref Range   Specimen Description      BLOOD LEFT ANTECUBITAL Performed at [facility], [address], [place], [place] [address]    Special Requests      BOTTLES DRAWN AEROBIC AND ANAEROBIC Blood Culture adequate volume Performed at [facility], [address], [place], [place] [address]    Culture      NO GROWTH < 12 HOURS Performed at [facility], [address], [place], [place] [address]    Report Status PENDING   Protime-INR     Status: None   Collection Time: 12/30/23  4:02 AM  Result Value Ref Range   Prothrombin Time 12.4 11.4 - 15.2 seconds   INR 0.9 0.8 - 1.2    Comment: (NOTE) INR goal varies based on device and disease states. Performed at [facility], [address], [place], [place] [address]   Troponin I (High Sensitivity)     Status: None    Collection Time: 12/30/23  4:02 AM  Result Value Ref Range   Troponin I (High Sensitivity) 5 <18 ng/L    Comment: (NOTE) Elevated high sensitivity troponin I (hsTnI) values and significant  changes across serial measurements may suggest ACS but many other  chronic and acute conditions are known to elevate hsTnI results.  Refer to the Links section for chest pain algorithms and additional  guidance. Performed at [facility], [address] [place], [place] [address]   Lipase, blood     Status: None   Collection Time: 12/30/23  4:02 AM  Result Value Ref Range   Lipase 25 11 - 51 U/L    Comment: Performed at [facility], [address], [place], [place] [address]  Blood culture (routine x 2)     Status: None (Preliminary result)   Collection Time: 12/30/23  4:05 AM   Specimen: BLOOD LEFT HAND  Result Value Ref Range   Specimen Description      BLOOD LEFT HAND Performed at [facility], [address] [place], [place] [address]    Special Requests      BOTTLES DRAWN AEROBIC AND ANAEROBIC Blood Culture adequate volume Performed at [facility], [address] [place], [place] [address]    Culture      NO GROWTH < 12 HOURS Performed at [facility], [address], [place], [place] [address]    Report Status PENDING   Brain natriuretic peptide     Status: None   Collection Time: 12/30/23  4:50 AM  Result Value Ref Range   B Natriuretic Peptide 80.1 0.0 - 100.0 pg/mL    Comment: Performed at [facility], [address], [place], [place] [address]  CBC with Differential/Platelet     Status: Abnormal  Collection Time: 12/30/23  5:00 AM  Result Value Ref Range   WBC 8.2 4.0 - 10.5 K/uL   RBC 3.48 (L) 4.22 - 5.81 MIL/uL   Hemoglobin 10.0 (L) 13.0 - 17.0 g/dL   HCT 69.6 (L) 60.9 - 47.9 %   MCV 87.1 80.0 - 100.0 fL   MCH 28.7 26.0 - 34.0 pg   MCHC 33.0 30.0 - 36.0 g/dL    RDW 85.3 88.4 - 84.4 %   Platelets 149 (L) 150 - 400 K/uL   nRBC 0.0 0.0 - 0.2 %   Neutrophils Relative % 87 %   Neutro Abs 7.1 1.7 - 7.7 K/uL   Lymphocytes Relative 4 %   Lymphs Abs 0.3 (L) 0.7 - 4.0 K/uL   Monocytes Relative 8 %   Monocytes Absolute 0.6 0.1 - 1.0 K/uL   Eosinophils Relative 0 %   Eosinophils Absolute 0.0 0.0 - 0.5 K/uL   Basophils Relative 0 %   Basophils Absolute 0.0 0.0 - 0.1 K/uL   Immature Granulocytes 1 %   Abs Immature Granulocytes 0.05 0.00 - 0.07 K/uL    Comment: Performed at [facility], [address], [place], [place] [address]  Comprehensive metabolic panel     Status: Abnormal   Collection Time: 12/30/23  5:34 AM  Result Value Ref Range   Sodium 135 135 - 145 mmol/L   Potassium 4.4 3.5 - 5.1 mmol/L   Chloride 100 98 - 111 mmol/L   CO2 22 22 - 32 mmol/L   Glucose, Bld 153 (H) 70 - 99 mg/dL    Comment: Glucose reference range applies only to samples taken after fasting for at least 8 hours.   BUN 28 (H) 8 - 23 mg/dL   Creatinine, Ser 8.67 (H) 0.61 - 1.24 mg/dL   Calcium 9.4 8.9 - 89.6 mg/dL   Total Protein 7.3 6.5 - 8.1 g/dL   Albumin 3.5 3.5 - 5.0 g/dL   AST 33 15 - 41 U/L   ALT 18 0 - 44 U/L   Alkaline Phosphatase 62 38 - 126 U/L   Total Bilirubin 1.0 0.0 - 1.2 mg/dL   GFR, Estimated 54 (L) >60 mL/min    Comment: (NOTE) Calculated using the CKD-EPI Creatinine Equation (2021)    Anion gap 13 5 - 15    Comment: Performed at [facility], [address] [place], [place] [address]  I-stat chem 8, ED (not at [facility], [facility] or [facility])     Status: Abnormal   Collection Time: 12/30/23  5:38 AM  Result Value Ref Range   Sodium 136 135 - 145 mmol/L   Potassium 4.2 3.5 - 5.1 mmol/L   Chloride 100 98 - 111 mmol/L   BUN 25 (H) 8 - 23 mg/dL   Creatinine, Ser 8.59 (H) 0.61 - 1.24 mg/dL   Glucose, Bld 860 (H) 70 - 99 mg/dL    Comment: Glucose reference range applies only to samples taken after fasting for at least 8  hours.   Calcium, Ion 1.16 1.15 - 1.40 mmol/L   TCO2 25 22 - 32 mmol/L   Hemoglobin 9.2 (L) 13.0 - 17.0 g/dL   HCT 72.9 (L) 60.9 - 47.9 %  Lactic acid, plasma     Status: None   Collection Time: 12/30/23  5:40 AM  Result Value Ref Range   Lactic Acid, Venous 1.2 0.5 - 1.9 mmol/L    Comment: Performed at [facility], [address] [place],  [place] [address]  Troponin I (High Sensitivity)     Status: None   Collection Time: 12/30/23  5:40 AM  Result Value Ref Range   Troponin I (High Sensitivity) 6 <18 ng/L    Comment: (NOTE) Elevated high sensitivity troponin I (hsTnI) values and significant  changes across serial measurements may suggest ACS but many other  chronic and acute conditions are known to elevate hsTnI results.  Refer to the Links section for chest pain algorithms and additional  guidance. Performed at [facility], [address] [place], [place] [address]   ABO/Rh     Status: None   Collection Time: 12/30/23 10:19 AM  Result Value Ref Range   ABO/RH(D)      A POS Performed at [facility], [address], [place], [place] [address]    CT Angio Chest PE W and/or Wo Contrast Result Date: 12/30/2023 CLINICAL DATA:  PE suspected, abdominal pain, non-small-cell lung cancer * Tracking Code: BO * EXAM: CT ANGIOGRAPHY CHEST CT ABDOMEN AND PELVIS WITH CONTRAST TECHNIQUE: Multidetector CT imaging of the chest was performed using the standard protocol during bolus administration of intravenous contrast. Multiplanar CT image reconstructions and MIPs were obtained to evaluate the vascular anatomy. Multidetector CT imaging of the abdomen and pelvis was performed using the standard protocol during bolus administration of intravenous contrast. RADIATION DOSE REDUCTION: This exam was performed according to the departmental dose-optimization program which includes automated exposure control, adjustment of the mA and/or kV  according to patient size and/or use of iterative reconstruction technique. CONTRAST:  OMNIPAQUE  IOHEXOL  350 MG/ML SOLN COMPARISON:  CT chest abdomen pelvis, 12/11/2023 FINDINGS: CT CHEST ANGIOGRAM FINDINGS Cardiovascular: Satisfactory opacification of the pulmonary arteries to the segmental level. No evidence of pulmonary embolism. Mild cardiomegaly. Three-vessel coronary artery calcifications. No pericardial effusion. Aortic atherosclerosis. Mediastinum/Nodes: No enlarged mediastinal, hilar, or axillary lymph nodes. Thyroid  gland, trachea, and esophagus demonstrate no significant findings. Lungs/Pleura: Diminished volume of a left pleural effusion. Otherwise unchanged findings when compared to recent restaging examination with treated masses in the left upper lobe and right lower lobe. Musculoskeletal: No chest wall abnormality. No acute osseous findings. Nondisplaced, subacute pathologic fracture of the posterior left third rib (series 4, image 36). Review of the MIP images confirms the above findings. CT ABDOMEN PELVIS FINDINGS Hepatobiliary: No solid liver abnormality is seen. No gallstones, gallbladder wall thickening, or biliary dilatation. Pancreas: Unremarkable. No pancreatic ductal dilatation or surrounding inflammatory changes. Spleen: Normal in size without significant abnormality. Adrenals/Urinary Tract: Adrenal glands are unremarkable. Kidneys are normal, without renal calculi, solid lesion, or hydronephrosis. Bladder is unremarkable. Stomach/Bowel: Stomach is within normal limits. New wall thickening and inflammatory fat stranding about the cecum (series 2, image 47, series 8, image 93). Mildly distended, fluid-filled loops of mid to distal small bowel throughout the abdomen, largest, distal loops measuring up to 4.1 cm in caliber. The terminal ileum is thickened and fecalized (series 2, image 49). Colon is generally decompressed of the rectum with scattered gas and stool throughout. Sigmoid  diverticulosis. Vascular/Lymphatic: Aortic atherosclerosis. No enlarged abdominal or pelvic lymph nodes. Reproductive: Prostatomegaly with biopsy marking clips and fiducials. Other: Small fat containing left inguinal hernia.  No ascites. Musculoskeletal: No acute osseous findings. Unchanged sclerotic metastases of the posterior right ninth rib and T9 vertebral body. Unchanged sclerotic osseous metastases of the left sacrum and ilium. IMPRESSION: 1. Negative examination for pulmonary embolism. 2. New wall thickening and inflammatory fat stranding about the cecum, consistent with nonspecific  infectious or inflammatory colitis. 3. Mildly distended, fluid-filled loops of mid to distal small bowel throughout the abdomen, largest, distal loops measuring up to 4.1 cm in caliber. The terminal ileum is thickened and fecalized. Findings suggest obstipation of the ileocecal valve secondary to colitis and/or associated infectious or inflammatory ileus. 4. Diminished volume of a left pleural effusion. Otherwise unchanged oncologic findings when compared to recent restaging examination with treated masses in the left upper lobe and right lower lobe. 5. Unchanged sclerotic osseous metastases of the posterior right ninth rib and T9 vertebral body. Unchanged sclerotic osseous metastases of the left sacrum and ilium. 6. Coronary artery disease. Aortic Atherosclerosis (ICD10-I70.0). Electronically Signed   By: Marolyn JONETTA Jaksch M.D.   On: 12/30/2023 06:51   CT ABDOMEN PELVIS W CONTRAST Result Date: 12/30/2023 CLINICAL DATA:  PE suspected, abdominal pain, non-small-cell lung cancer * Tracking Code: BO * EXAM: CT ANGIOGRAPHY CHEST CT ABDOMEN AND PELVIS WITH CONTRAST TECHNIQUE: Multidetector CT imaging of the chest was performed using the standard protocol during bolus administration of intravenous contrast. Multiplanar CT image reconstructions and MIPs were obtained to evaluate the vascular anatomy. Multidetector CT imaging of the abdomen  and pelvis was performed using the standard protocol during bolus administration of intravenous contrast. RADIATION DOSE REDUCTION: This exam was performed according to the departmental dose-optimization program which includes automated exposure control, adjustment of the mA and/or kV according to patient size and/or use of iterative reconstruction technique. CONTRAST:  OMNIPAQUE  IOHEXOL  350 MG/ML SOLN COMPARISON:  CT chest abdomen pelvis, 12/11/2023 FINDINGS: CT CHEST ANGIOGRAM FINDINGS Cardiovascular: Satisfactory opacification of the pulmonary arteries to the segmental level. No evidence of pulmonary embolism. Mild cardiomegaly. Three-vessel coronary artery calcifications. No pericardial effusion. Aortic atherosclerosis. Mediastinum/Nodes: No enlarged mediastinal, hilar, or axillary lymph nodes. Thyroid  gland, trachea, and esophagus demonstrate no significant findings. Lungs/Pleura: Diminished volume of a left pleural effusion. Otherwise unchanged findings when compared to recent restaging examination with treated masses in the left upper lobe and right lower lobe. Musculoskeletal: No chest wall abnormality. No acute osseous findings. Nondisplaced, subacute pathologic fracture of the posterior left third rib (series 4, image 36). Review of the MIP images confirms the above findings. CT ABDOMEN PELVIS FINDINGS Hepatobiliary: No solid liver abnormality is seen. No gallstones, gallbladder wall thickening, or biliary dilatation. Pancreas: Unremarkable. No pancreatic ductal dilatation or surrounding inflammatory changes. Spleen: Normal in size without significant abnormality. Adrenals/Urinary Tract: Adrenal glands are unremarkable. Kidneys are normal, without renal calculi, solid lesion, or hydronephrosis. Bladder is unremarkable. Stomach/Bowel: Stomach is within normal limits. New wall thickening and inflammatory fat stranding about the cecum (series 2, image 47, series 8, image 93). Mildly distended,  fluid-filled loops of mid to distal small bowel throughout the abdomen, largest, distal loops measuring up to 4.1 cm in caliber. The terminal ileum is thickened and fecalized (series 2, image 49). Colon is generally decompressed of the rectum with scattered gas and stool throughout. Sigmoid diverticulosis. Vascular/Lymphatic: Aortic atherosclerosis. No enlarged abdominal or pelvic lymph nodes. Reproductive: Prostatomegaly with biopsy marking clips and fiducials. Other: Small fat containing left inguinal hernia.  No ascites. Musculoskeletal: No acute osseous findings. Unchanged sclerotic metastases of the posterior right ninth rib and T9 vertebral body. Unchanged sclerotic osseous metastases of the left sacrum and ilium. IMPRESSION: 1. Negative examination for pulmonary embolism. 2. New wall thickening and inflammatory fat stranding about the cecum, consistent with nonspecific infectious or inflammatory colitis. 3. Mildly distended, fluid-filled loops of mid to distal small bowel throughout the abdomen, largest,  distal loops measuring up to 4.1 cm in caliber. The terminal ileum is thickened and fecalized. Findings suggest obstipation of the ileocecal valve secondary to colitis and/or associated infectious or inflammatory ileus. 4. Diminished volume of a left pleural effusion. Otherwise unchanged oncologic findings when compared to recent restaging examination with treated masses in the left upper lobe and right lower lobe. 5. Unchanged sclerotic osseous metastases of the posterior right ninth rib and T9 vertebral body. Unchanged sclerotic osseous metastases of the left sacrum and ilium. 6. Coronary artery disease. Aortic Atherosclerosis (ICD10-I70.0). Electronically Signed   By: Marolyn JONETTA Jaksch M.D.   On: 12/30/2023 06:51   DG ABD ACUTE 2+V W 1V CHEST Result Date: 12/30/2023 CLINICAL DATA:  Abdominal pain. EXAM: DG ABDOMEN ACUTE WITH 1 VIEW CHEST COMPARISON:  Chest abdomen pelvis CT 12/11/2023 FINDINGS: Low lung  volumes. Ill-defined opacities are seen in the right parahilar and left mid lungs with associated fiducial markers. No pulmonary edema or substantial pleural effusion. The cardio pericardial silhouette is enlarged. No acute bony abnormality. Supine abdomen shows gaseous distention of small bowel in the central abdomen measuring up to 4.2 cm diameter. Lucency under the left hemidiaphragm is probably related to gaseous distention of the stomach . Free air under the left hemidiaphragm is considered less likely but not excluded. Degenerative changes are seen in the lumbar spine. IMPRESSION: 1. Gaseous distention of small bowel in the central abdomen measuring up to 4.2 cm diameter. Imaging features are compatible with small bowel obstruction. CT abdomen and pelvis with oral and intravenous contrast could be used to further evaluate. 2. Lucency under the left hemidiaphragm is probably related to gaseous distention of the stomach . Free gas under the left hemidiaphragm is considered less likely but not excluded. This could also be further assessed at the time of CT. Electronically Signed   By: Camellia Candle M.D.   On: 12/30/2023 05:33    Anti-infectives (From admission, onward)    None       Assessment/Plan Small bowel obstruction likely secondary to cecal colitis of unclear etiology  The patient has been seen, examined, labs, vitals, chart, and imaging personally reviewed.  He appears to have inflammatory changes noted near ICV and in his cecum.  His WBC is normal with no sick contacts to suggest infectious, primary doesn't feel this is infectious either and would like to hold on abx therapy currently.  He has no family history of IBD and this would certainly be a very late presentation.  This could certainly be secondary to his history of radiation to his pelvis.  No obvious masses or evidence of mets are seen on his CT scan.  He last had a colonoscopy 5-10 years ago that was normal by report.    He has  significant abdominal distention with profuse vomiting.  I have placed an NGT at bedside and gotten over 1L out already.  We will continue with bowel rest for today.  Would not pursue the SBO protocol yet and allow for decompression and further evaluation first.  We will continue to follow the patient with you.  D/w primary service.    FEN - NPO, NGT to LIWS/IVFs per primary VTE - SCDs, heparin  ID - none  Non-small cell lung cancer, mets - on immunotherapy Prostate cancer - hx of radiation HTN Mild AKI HLD  I reviewed nursing notes, hospitalist notes, last 24 h vitals and pain scores, last 24 h labs and trends, and last 24 h imaging results.  Burnard FORBES Banter, [facility] Surgery 12/30/2023, 11:32 AM Please see Amion for pager number during day hours 7:00am-4:30pm

## 2023-12-30 NOTE — H&P (Addendum)
 History and Physical  Chad Moreno FMW:[id number] DOB: [birth year] DOA: 12/30/2023  PCP: [name], MD   Chief Complaint: Abdominal pain, vomiting  HPI: Chad Moreno is a 83 y.o. male with medical history significant for recurrent/metastatic non-small cell lung cancer on immunotherapy, hypertension, hyperlipidemia being admitted to the hospital with 24 hours of vomiting and concern for cecal colitis and bowel obstruction.  States he was in his usual state of health until yesterday, when he had sudden onset of burning epigastric abdominal pain, associated vomiting.  Last bowel movement was yesterday morning, and was very dark.  He has not seen any bright red bleeding.  He takes a daily aspirin at home.  Evaluation in the emergency department as detailed below shows evidence of inflammatory colitis of the cecum.  Patient is vomited multiple times here in the emergency department, NG tube was ordered however was unable to be placed after 3 attempts.  ER provider has consulted [facility] Dr. Rosalie.  Review of Systems: Please see HPI for pertinent positives and negatives. A complete 10 system review of systems are otherwise negative.  Past Medical History:  Diagnosis Date   Age-related cataract of left eye    Amblyopia, right eye    Arthritis    Cancer (HCC)    Dysuria    ED (erectile dysfunction)    Elevated PSA    Epidermal cyst    Family history of prostate cancer    Gout    Hip pain    Histoplasmosis    Hyperlipidemia    Hypermetropia, left eye    Hypertension    Impaired glucose tolerance    Joint pain    Left shoulder pain    Lower back pain    Melanocytic nevi, unspecified    Nummular dermatitis    Ocular hypertension    Palmar fascial fibromatosis    Prostate cancer screening    Regular astigmatism, right eye    Senile nuclear sclerosis    Past Surgical History:  Procedure Laterality Date   BRONCHIAL BIOPSY  08/27/2022   Procedure: BRONCHIAL BIOPSIES;  Surgeon:  Shelah Lamar RAMAN, MD;  Location: [facility];  Service: Pulmonary;;   BRONCHIAL BRUSHINGS  08/27/2022   Procedure: BRONCHIAL BRUSHINGS;  Surgeon: Shelah Lamar RAMAN, MD;  Location: [facility];  Service: Pulmonary;;   BRONCHIAL NEEDLE ASPIRATION BIOPSY  08/27/2022   Procedure: BRONCHIAL NEEDLE ASPIRATION BIOPSIES;  Surgeon: Shelah Lamar RAMAN, MD;  Location: [facility];  Service: Pulmonary;;   CONSTRICTING FINGER RING EXCISION Left    CYST REMOVAL TRUNK Left    left posterior shoulder   FIDUCIAL MARKER PLACEMENT  08/27/2022   Procedure: FIDUCIAL MARKER PLACEMENT;  Surgeon: Shelah Lamar RAMAN, MD;  Location: [facility];  Service: Pulmonary;;   GOLD SEED IMPLANT N/A 10/02/2022   Procedure: GOLD SEED IMPLANT;  Surgeon: Lovie Arlyss CROME, MD;  Location: [facility];  Service: Urology;  Laterality: N/A;   index finger Left    IR THORACENTESIS ASP PLEURAL SPACE W/IMG GUIDE  06/14/2023   Prostate needle biopsy     SPACE OAR INSTILLATION N/A 10/02/2022   Procedure: SPACE OAR INSTILLATION;  Surgeon: Lovie Arlyss CROME, MD;  Location: [facility];  Service: Urology;  Laterality: N/A;   Social History:  reports that he quit smoking about 44 years ago. His smoking use included cigarettes. He started smoking about 54 years ago. He has a 5 pack-year smoking history. He has never used smokeless tobacco. He reports current alcohol   use. He reports that he does not use drugs.  Allergies  Allergen Reactions   Sulfa Antibiotics Anaphylaxis    Family History  Problem Relation Age of Onset   Prostate cancer Father      Prior to Admission medications   Medication Sig Start Date End Date Taking? Authorizing Provider  amLODipine (NORVASC) 2.5 MG tablet Take 2.5 mg by mouth in the morning. 02/07/19  Yes [provider]  Ascorbic Acid (VITAMIN C PO) Take 1 tablet by mouth daily with breakfast.   Yes [provider]  aspirin EC 81 MG tablet Take 81 mg by mouth in the morning.    Yes [provider]  B Complex Vitamins (VITAMIN-B COMPLEX) TABS Take 1 tablet by mouth daily with breakfast.   Yes [provider]  binimetinib  (MEKTOVI ) 15 MG tablet Take 3 tablets (45 mg total) by mouth 2 (two) times daily. 11/06/23  Yes [name] [name], MD  calcium carbonate (OSCAL) 1500 (600 Ca) MG TABS tablet Take 1,200 mg by mouth 2 (two) times daily with a meal.   Yes [provider]  celecoxib  (CELEBREX ) 200 MG capsule Take 1 capsule (200 mg total) by mouth 2 (two) times daily. 12/03/23  Yes Pickenpack-Cousar, Athena N, NP  Cholecalciferol (VITAMIN D3) 50 MCG (2000 UT) TABS Take 2,000 Units by mouth daily with breakfast.   Yes [provider]  cyanocobalamin (VITAMIN B12) 1000 MCG tablet Take 1,000 mcg by mouth daily.   Yes [provider]  encorafenib  (BRAFTOVI ) 75 MG capsule Take 6 capsules (450 mg total) by mouth daily. 11/06/23  Yes [name] [name], MD  ferrous sulfate  325 (65 FE) MG EC tablet Take 1 tablet (325 mg total) by mouth daily as needed. Patient taking differently: Take 325 mg by mouth every other day. 12/03/23  Yes Pickenpack-Cousar, Fannie SAILOR, NP  losartan (COZAAR) 100 MG tablet Take 50 mg by mouth daily. 02/07/19  Yes [provider]  Omega-3 Fatty Acids (FISH OIL PO) Take 1 capsule by mouth daily with breakfast.   Yes [provider]  oxyCODONE -acetaminophen  (PERCOCET) 7.5-325 MG tablet Take 1 tablet by mouth every 6 (six) hours as needed for severe pain (pain score 7-10). 12/18/23  Yes Pickenpack-Cousar, Athena N, NP  POTASSIUM PO Take 1 tablet by mouth daily with breakfast.   Yes [provider]  Probiotic Product (PROBIOTIC PEARLS ADVANTAGE PO) Take 1 capsule by mouth in the morning.   Yes [provider]  silodosin  (RAPAFLO ) 8 MG CAPS capsule Take 8 mg by mouth daily. 07/04/23  Yes [provider]    Physical Exam: BP 115/70   Pulse 90   Temp 99.2 F (37.3 C) (Oral)   Resp (!) 24    Ht 6' (1.829 m)   Wt 95.3 kg   SpO2 91%   BMI 28.48 kg/m  General:  Alert, oriented, calm, in no acute distress, currently resting comfortably.  His wife is at the bedside. Cardiovascular: RRR, no murmurs or rubs, no peripheral edema  Respiratory: clear to auscultation bilaterally, no wheezes, no crackles  Abdomen: soft, nontender, distended, slow bowel tones heard  Skin: dry, no rashes  Musculoskeletal: no joint effusions, normal range of motion  Psychiatric: appropriate affect, normal speech  Neurologic: extraocular muscles intact, clear speech, moving all extremities with intact sensorium         Labs on Admission:  Basic Metabolic Panel: Recent Labs  Lab 12/30/23 0534 12/30/23 0538  NA 135 136  K 4.4 4.2  CL  100 100  CO2 22  --   GLUCOSE 153* 139*  BUN 28* 25*  CREATININE 1.32* 1.40*  CALCIUM 9.4  --    Liver Function Tests: Recent Labs  Lab 12/30/23 0534  AST 33  ALT 18  ALKPHOS 62  BILITOT 1.0  PROT 7.3  ALBUMIN 3.5   Recent Labs  Lab 12/30/23 0402  LIPASE 25   No results for input(s): AMMONIA in the last 168 hours. CBC: Recent Labs  Lab 12/30/23 0500 12/30/23 0538  WBC 8.2  --   NEUTROABS 7.1  --   HGB 10.0* 9.2*  HCT 30.3* 27.0*  MCV 87.1  --   PLT 149*  --    Cardiac Enzymes: No results for input(s): CKTOTAL, CKMB, CKMBINDEX, TROPONINI in the last 168 hours. BNP (last 3 results) Recent Labs    12/30/23 0450  BNP 80.1    ProBNP (last 3 results) No results for input(s): PROBNP in the last 8760 hours.  CBG: No results for input(s): GLUCAP in the last 168 hours.  Radiological Exams on Admission: CT Angio Chest PE W and/or Wo Contrast Result Date: 12/30/2023 CLINICAL DATA:  PE suspected, abdominal pain, non-small-cell lung cancer * Tracking Code: BO * EXAM: CT ANGIOGRAPHY CHEST CT ABDOMEN AND PELVIS WITH CONTRAST TECHNIQUE: Multidetector CT imaging of the chest was performed using the standard protocol during bolus  administration of intravenous contrast. Multiplanar CT image reconstructions and MIPs were obtained to evaluate the vascular anatomy. Multidetector CT imaging of the abdomen and pelvis was performed using the standard protocol during bolus administration of intravenous contrast. RADIATION DOSE REDUCTION: This exam was performed according to the departmental dose-optimization program which includes automated exposure control, adjustment of the mA and/or kV according to patient size and/or use of iterative reconstruction technique. CONTRAST:  OMNIPAQUE  IOHEXOL  350 MG/ML SOLN COMPARISON:  CT chest abdomen pelvis, 12/11/2023 FINDINGS: CT CHEST ANGIOGRAM FINDINGS Cardiovascular: Satisfactory opacification of the pulmonary arteries to the segmental level. No evidence of pulmonary embolism. Mild cardiomegaly. Three-vessel coronary artery calcifications. No pericardial effusion. Aortic atherosclerosis. Mediastinum/Nodes: No enlarged mediastinal, hilar, or axillary lymph nodes. Thyroid  gland, trachea, and esophagus demonstrate no significant findings. Lungs/Pleura: Diminished volume of a left pleural effusion. Otherwise unchanged findings when compared to recent restaging examination with treated masses in the left upper lobe and right lower lobe. Musculoskeletal: No chest wall abnormality. No acute osseous findings. Nondisplaced, subacute pathologic fracture of the posterior left third rib (series 4, image 36). Review of the MIP images confirms the above findings. CT ABDOMEN PELVIS FINDINGS Hepatobiliary: No solid liver abnormality is seen. No gallstones, gallbladder wall thickening, or biliary dilatation. Pancreas: Unremarkable. No pancreatic ductal dilatation or surrounding inflammatory changes. Spleen: Normal in size without significant abnormality. Adrenals/Urinary Tract: Adrenal glands are unremarkable. Kidneys are normal, without renal calculi, solid lesion, or hydronephrosis. Bladder is unremarkable.  Stomach/Bowel: Stomach is within normal limits. New wall thickening and inflammatory fat stranding about the cecum (series 2, image 47, series 8, image 93). Mildly distended, fluid-filled loops of mid to distal small bowel throughout the abdomen, largest, distal loops measuring up to 4.1 cm in caliber. The terminal ileum is thickened and fecalized (series 2, image 49). Colon is generally decompressed of the rectum with scattered gas and stool throughout. Sigmoid diverticulosis. Vascular/Lymphatic: Aortic atherosclerosis. No enlarged abdominal or pelvic lymph nodes. Reproductive: Prostatomegaly with biopsy marking clips and fiducials. Other: Small fat containing left inguinal hernia.  No ascites. Musculoskeletal: No acute osseous findings. Unchanged sclerotic metastases of the  posterior right ninth rib and T9 vertebral body. Unchanged sclerotic osseous metastases of the left sacrum and ilium. IMPRESSION: 1. Negative examination for pulmonary embolism. 2. New wall thickening and inflammatory fat stranding about the cecum, consistent with nonspecific infectious or inflammatory colitis. 3. Mildly distended, fluid-filled loops of mid to distal small bowel throughout the abdomen, largest, distal loops measuring up to 4.1 cm in caliber. The terminal ileum is thickened and fecalized. Findings suggest obstipation of the ileocecal valve secondary to colitis and/or associated infectious or inflammatory ileus. 4. Diminished volume of a left pleural effusion. Otherwise unchanged oncologic findings when compared to recent restaging examination with treated masses in the left upper lobe and right lower lobe. 5. Unchanged sclerotic osseous metastases of the posterior right ninth rib and T9 vertebral body. Unchanged sclerotic osseous metastases of the left sacrum and ilium. 6. Coronary artery disease. Aortic Atherosclerosis (ICD10-I70.0). Electronically Signed   By: Marolyn JONETTA Jaksch M.D.   On: 12/30/2023 06:51   CT ABDOMEN PELVIS W  CONTRAST Result Date: 12/30/2023 CLINICAL DATA:  PE suspected, abdominal pain, non-small-cell lung cancer * Tracking Code: BO * EXAM: CT ANGIOGRAPHY CHEST CT ABDOMEN AND PELVIS WITH CONTRAST TECHNIQUE: Multidetector CT imaging of the chest was performed using the standard protocol during bolus administration of intravenous contrast. Multiplanar CT image reconstructions and MIPs were obtained to evaluate the vascular anatomy. Multidetector CT imaging of the abdomen and pelvis was performed using the standard protocol during bolus administration of intravenous contrast. RADIATION DOSE REDUCTION: This exam was performed according to the departmental dose-optimization program which includes automated exposure control, adjustment of the mA and/or kV according to patient size and/or use of iterative reconstruction technique. CONTRAST:  OMNIPAQUE  IOHEXOL  350 MG/ML SOLN COMPARISON:  CT chest abdomen pelvis, 12/11/2023 FINDINGS: CT CHEST ANGIOGRAM FINDINGS Cardiovascular: Satisfactory opacification of the pulmonary arteries to the segmental level. No evidence of pulmonary embolism. Mild cardiomegaly. Three-vessel coronary artery calcifications. No pericardial effusion. Aortic atherosclerosis. Mediastinum/Nodes: No enlarged mediastinal, hilar, or axillary lymph nodes. Thyroid  gland, trachea, and esophagus demonstrate no significant findings. Lungs/Pleura: Diminished volume of a left pleural effusion. Otherwise unchanged findings when compared to recent restaging examination with treated masses in the left upper lobe and right lower lobe. Musculoskeletal: No chest wall abnormality. No acute osseous findings. Nondisplaced, subacute pathologic fracture of the posterior left third rib (series 4, image 36). Review of the MIP images confirms the above findings. CT ABDOMEN PELVIS FINDINGS Hepatobiliary: No solid liver abnormality is seen. No gallstones, gallbladder wall thickening, or biliary dilatation. Pancreas:  Unremarkable. No pancreatic ductal dilatation or surrounding inflammatory changes. Spleen: Normal in size without significant abnormality. Adrenals/Urinary Tract: Adrenal glands are unremarkable. Kidneys are normal, without renal calculi, solid lesion, or hydronephrosis. Bladder is unremarkable. Stomach/Bowel: Stomach is within normal limits. New wall thickening and inflammatory fat stranding about the cecum (series 2, image 47, series 8, image 93). Mildly distended, fluid-filled loops of mid to distal small bowel throughout the abdomen, largest, distal loops measuring up to 4.1 cm in caliber. The terminal ileum is thickened and fecalized (series 2, image 49). Colon is generally decompressed of the rectum with scattered gas and stool throughout. Sigmoid diverticulosis. Vascular/Lymphatic: Aortic atherosclerosis. No enlarged abdominal or pelvic lymph nodes. Reproductive: Prostatomegaly with biopsy marking clips and fiducials. Other: Small fat containing left inguinal hernia.  No ascites. Musculoskeletal: No acute osseous findings. Unchanged sclerotic metastases of the posterior right ninth rib and T9 vertebral body. Unchanged sclerotic osseous metastases of the left sacrum and ilium. IMPRESSION:  1. Negative examination for pulmonary embolism. 2. New wall thickening and inflammatory fat stranding about the cecum, consistent with nonspecific infectious or inflammatory colitis. 3. Mildly distended, fluid-filled loops of mid to distal small bowel throughout the abdomen, largest, distal loops measuring up to 4.1 cm in caliber. The terminal ileum is thickened and fecalized. Findings suggest obstipation of the ileocecal valve secondary to colitis and/or associated infectious or inflammatory ileus. 4. Diminished volume of a left pleural effusion. Otherwise unchanged oncologic findings when compared to recent restaging examination with treated masses in the left upper lobe and right lower lobe. 5. Unchanged sclerotic osseous  metastases of the posterior right ninth rib and T9 vertebral body. Unchanged sclerotic osseous metastases of the left sacrum and ilium. 6. Coronary artery disease. Aortic Atherosclerosis (ICD10-I70.0). Electronically Signed   By: Marolyn JONETTA Jaksch M.D.   On: 12/30/2023 06:51   DG ABD ACUTE 2+V W 1V CHEST Result Date: 12/30/2023 CLINICAL DATA:  Abdominal pain. EXAM: DG ABDOMEN ACUTE WITH 1 VIEW CHEST COMPARISON:  Chest abdomen pelvis CT 12/11/2023 FINDINGS: Low lung volumes. Ill-defined opacities are seen in the right parahilar and left mid lungs with associated fiducial markers. No pulmonary edema or substantial pleural effusion. The cardio pericardial silhouette is enlarged. No acute bony abnormality. Supine abdomen shows gaseous distention of small bowel in the central abdomen measuring up to 4.2 cm diameter. Lucency under the left hemidiaphragm is probably related to gaseous distention of the stomach . Free air under the left hemidiaphragm is considered less likely but not excluded. Degenerative changes are seen in the lumbar spine. IMPRESSION: 1. Gaseous distention of small bowel in the central abdomen measuring up to 4.2 cm diameter. Imaging features are compatible with small bowel obstruction. CT abdomen and pelvis with oral and intravenous contrast could be used to further evaluate. 2. Lucency under the left hemidiaphragm is probably related to gaseous distention of the stomach . Free gas under the left hemidiaphragm is considered less likely but not excluded. This could also be further assessed at the time of CT. Electronically Signed   By: Camellia Candle M.D.   On: 12/30/2023 05:33   Assessment/Plan Chad Moreno is a 83 y.o. male with medical history significant for recurrent/metastatic non-small cell lung cancer on immunotherapy, hypertension, hyperlipidemia being admitted to the hospital with 24 hours of vomiting and concern for cecal colitis and bowel obstruction.  Bowel obstruction-with  inflammatory fat stranding in the cecum causing obstipation and obstructive symptoms.  Multiple unsuccessful attempts at placing NG tube.  Patient is currently not vomiting. -Inpatient admission -IV fluid -N.p.o. -Hold home aspirin -Pain and nausea medication as needed -ER provider has consulted [facility] Dr. Rosalie  Non-small cell lung cancer-hold immunotherapy, his oncologist Dr. [name] added to inpatient treatment team  Hypertension-hold home amlodipine and losartan while n.p.o., IV hydralazine  as needed in the meantime  AKI-in the setting of baseline normal renal function, likely due to vomiting and volume loss. -Hold nephrotoxins including losartan -IV fluids -Monitor renal function with daily labs  DVT prophylaxis: Subcutaneous heparin     Code Status: Full Code  Consults called: [facility]  Admission status: The appropriate patient status for this patient is INPATIENT. Inpatient status is judged to be reasonable and necessary in order to provide the required intensity of service to ensure the patient's safety. The patient's presenting symptoms, physical exam findings, and initial radiographic and laboratory data in the context of their chronic comorbidities is felt to place them at high risk for further clinical  deterioration. Furthermore, it is not anticipated that the patient will be medically stable for discharge from the hospital within 2 midnights of admission.    I certify that at the point of admission it is my clinical judgment that the patient will require inpatient hospital care spanning beyond 2 midnights from the point of admission due to high intensity of service, high risk for further deterioration and high frequency of surveillance required  Time spent: 56 minutes  Marilouise Densmore CHRISTELLA Gail MD [facility] Pager [phone]  If 7PM-7AM, please contact night-coverage [email or web address] Password [facility]  12/30/2023, 8:48 AM

## 2023-12-30 NOTE — ED Notes (Signed)
 Pt placed on 2 liters of oxygen due to pt's oxygen saturation dropping into the mid 80s

## 2023-12-30 NOTE — Plan of Care (Signed)

## 2023-12-30 NOTE — Consult Note (Signed)
 Reason for Consult: Bowel obstruction Referring Physician: Hospital team  Chad Moreno is an 83 y.o. male.  HPI: Patient seen and examined and hospital computer chart reviewed and case discussed with multiple family members and he is doing better after 2 L from his NG and we discussed when it is appropriate to pull the NG out and he has only been sick at home for 1 day in fact tends to have constipation and uses MiraLAX every week or 2 and his last colon screening was not too long ago i.e. January 2023 with a   virtual colonoscopy because my retired partner Dr. Donnald was unable to get his scope around his entire colon in the past and he has been on his current chemotherapy regimen and immunotherapy regimen since January and his prostate radiation was a year ago and up until yesterday he had no other GI complaints and no previous obstructions  Past Medical History:  Diagnosis Date   Age-related cataract of left eye    Amblyopia, right eye    Arthritis    Cancer (HCC)    Dysuria    ED (erectile dysfunction)    Elevated PSA    Epidermal cyst    Family history of prostate cancer    Gout    Hip pain    Histoplasmosis    Hyperlipidemia    Hypermetropia, left eye    Hypertension    Impaired glucose tolerance    Joint pain    Left shoulder pain    Lower back pain    Melanocytic nevi, unspecified    Nummular dermatitis    Ocular hypertension    Palmar fascial fibromatosis    Prostate cancer screening    Regular astigmatism, right eye    Senile nuclear sclerosis     Past Surgical History:  Procedure Laterality Date   BRONCHIAL BIOPSY  08/27/2022   Procedure: BRONCHIAL BIOPSIES;  Surgeon: Shelah Lamar RAMAN, MD;  Location: [facility];  Service: Pulmonary;;   BRONCHIAL BRUSHINGS  08/27/2022   Procedure: BRONCHIAL BRUSHINGS;  Surgeon: Shelah Lamar RAMAN, MD;  Location: [facility];  Service: Pulmonary;;   BRONCHIAL NEEDLE ASPIRATION BIOPSY  08/27/2022   Procedure: BRONCHIAL NEEDLE  ASPIRATION BIOPSIES;  Surgeon: Shelah Lamar RAMAN, MD;  Location: [facility];  Service: Pulmonary;;   CONSTRICTING FINGER RING EXCISION Left    CYST REMOVAL TRUNK Left    left posterior shoulder   FIDUCIAL MARKER PLACEMENT  08/27/2022   Procedure: FIDUCIAL MARKER PLACEMENT;  Surgeon: Shelah Lamar RAMAN, MD;  Location: [facility];  Service: Pulmonary;;   GOLD SEED IMPLANT N/A 10/02/2022   Procedure: GOLD SEED IMPLANT;  Surgeon: Lovie Arlyss CROME, MD;  Location: [facility];  Service: Urology;  Laterality: N/A;   index finger Left    IR THORACENTESIS ASP PLEURAL SPACE W/IMG GUIDE  06/14/2023   Prostate needle biopsy     SPACE OAR INSTILLATION N/A 10/02/2022   Procedure: SPACE OAR INSTILLATION;  Surgeon: Lovie Arlyss CROME, MD;  Location: [facility];  Service: Urology;  Laterality: N/A;    Family History  Problem Relation Age of Onset   Prostate cancer Father     Social History:  reports that he quit smoking about 44 years ago. His smoking use included cigarettes. He started smoking about 54 years ago. He has a 5 pack-year smoking history. He has never used smokeless tobacco. He reports current alcohol  use. He reports that he does not use drugs.  Allergies:  Allergies  Allergen Reactions   Sulfa Antibiotics Anaphylaxis    Medications: I have reviewed the patient's current medications.  Results for orders placed or performed during the hospital encounter of 12/30/23 (from the past 48 hours)  POC occult blood, ED     Status: Abnormal   Collection Time: 12/30/23  3:39 AM  Result Value Ref Range   Fecal Occult Bld POSITIVE (A) NEGATIVE  Type and screen [facility]     Status: None   Collection Time: 12/30/23  3:46 AM  Result Value Ref Range   ABO/RH(D) A POS    Antibody Screen NEG    Sample Expiration      01/02/2024,2359 Performed at [facility], [address], [place], [place] [address]   Lactic acid, plasma      Status: Abnormal   Collection Time: 12/30/23  3:59 AM  Result Value Ref Range   Lactic Acid, Venous 2.3 (HH) 0.5 - 1.9 mmol/L    Comment: CRITICAL RESULT CALLED TO, READ BACK BY AND VERIFIED WITH MARRICKS, L. RN AT 12/30/2023 0451 BY JEREMY C Performed at [facility], [address], [place], [place] [address]   Blood culture (routine x 2)     Status: None (Preliminary result)   Collection Time: 12/30/23  4:00 AM   Specimen: BLOOD  Result Value Ref Range   Specimen Description      BLOOD LEFT ANTECUBITAL Performed at [facility], [address], [place], [place] [address]    Special Requests      BOTTLES DRAWN AEROBIC AND ANAEROBIC Blood Culture adequate volume Performed at [facility], [address], [place], [place] [address]    Culture      NO GROWTH < 12 HOURS Performed at [facility], [address], [place], [place] [address]    Report Status PENDING   Protime-INR     Status: None   Collection Time: 12/30/23  4:02 AM  Result Value Ref Range   Prothrombin Time 12.4 11.4 - 15.2 seconds   INR 0.9 0.8 - 1.2    Comment: (NOTE) INR goal varies based on device and disease states. Performed at [facility], [address], [place], [place] [address]   Troponin I (High Sensitivity)     Status: None   Collection Time: 12/30/23  4:02 AM  Result Value Ref Range   Troponin I (High Sensitivity) 5 <18 ng/L    Comment: (NOTE) Elevated high sensitivity troponin I (hsTnI) values and significant  changes across serial measurements may suggest ACS but many other  chronic and acute conditions are known to elevate hsTnI results.  Refer to the Links section for chest pain algorithms and additional  guidance. Performed at [facility], [address], [place], [place] [address]   Lipase, blood     Status: None   Collection Time: 12/30/23  4:02 AM  Result Value Ref Range   Lipase 25 11 - 51  U/L    Comment: Performed at [facility], [address] [place], [place] [address]  Blood culture (routine x 2)     Status: None (Preliminary result)   Collection Time: 12/30/23  4:05 AM   Specimen: BLOOD LEFT HAND  Result Value Ref Range   Specimen Description      BLOOD LEFT HAND Performed at [facility], [address], [place], [place] [address]    Special Requests  BOTTLES DRAWN AEROBIC AND ANAEROBIC Blood Culture adequate volume Performed at [facility], [address] [place], [place] [address]    Culture      NO GROWTH < 12 HOURS Performed at [facility], [address], [place], [place] [address]    Report Status PENDING   Brain natriuretic peptide     Status: None   Collection Time: 12/30/23  4:50 AM  Result Value Ref Range   B Natriuretic Peptide 80.1 0.0 - 100.0 pg/mL    Comment: Performed at [facility], [address], [place], [place] [address]  CBC with Differential/Platelet     Status: Abnormal   Collection Time: 12/30/23  5:00 AM  Result Value Ref Range   WBC 8.2 4.0 - 10.5 K/uL   RBC 3.48 (L) 4.22 - 5.81 MIL/uL   Hemoglobin 10.0 (L) 13.0 - 17.0 g/dL   HCT 69.6 (L) 60.9 - 47.9 %   MCV 87.1 80.0 - 100.0 fL   MCH 28.7 26.0 - 34.0 pg   MCHC 33.0 30.0 - 36.0 g/dL   RDW 85.3 88.4 - 84.4 %   Platelets 149 (L) 150 - 400 K/uL   nRBC 0.0 0.0 - 0.2 %   Neutrophils Relative % 87 %   Neutro Abs 7.1 1.7 - 7.7 K/uL   Lymphocytes Relative 4 %   Lymphs Abs 0.3 (L) 0.7 - 4.0 K/uL   Monocytes Relative 8 %   Monocytes Absolute 0.6 0.1 - 1.0 K/uL   Eosinophils Relative 0 %   Eosinophils Absolute 0.0 0.0 - 0.5 K/uL   Basophils Relative 0 %   Basophils Absolute 0.0 0.0 - 0.1 K/uL   Immature Granulocytes 1 %   Abs Immature Granulocytes 0.05 0.00 - 0.07 K/uL    Comment: Performed at [facility], [address] [place], [place] [address]  Comprehensive  metabolic panel     Status: Abnormal   Collection Time: 12/30/23  5:34 AM  Result Value Ref Range   Sodium 135 135 - 145 mmol/L   Potassium 4.4 3.5 - 5.1 mmol/L   Chloride 100 98 - 111 mmol/L   CO2 22 22 - 32 mmol/L   Glucose, Bld 153 (H) 70 - 99 mg/dL    Comment: Glucose reference range applies only to samples taken after fasting for at least 8 hours.   BUN 28 (H) 8 - 23 mg/dL   Creatinine, Ser 8.67 (H) 0.61 - 1.24 mg/dL   Calcium 9.4 8.9 - 89.6 mg/dL   Total Protein 7.3 6.5 - 8.1 g/dL   Albumin 3.5 3.5 - 5.0 g/dL   AST 33 15 - 41 U/L   ALT 18 0 - 44 U/L   Alkaline Phosphatase 62 38 - 126 U/L   Total Bilirubin 1.0 0.0 - 1.2 mg/dL   GFR, Estimated 54 (L) >60 mL/min    Comment: (NOTE) Calculated using the CKD-EPI Creatinine Equation (2021)    Anion gap 13 5 - 15    Comment: Performed at [facility], [address] [place], [place] [address]  I-stat chem 8, ED (not at [facility], [facility] or [facility])     Status: Abnormal   Collection Time: 12/30/23  5:38 AM  Result Value Ref Range   Sodium 136 135 - 145 mmol/L   Potassium 4.2 3.5 - 5.1 mmol/L   Chloride 100 98 - 111 mmol/L   BUN 25 (H) 8 - 23 mg/dL   Creatinine, Ser 8.59 (  H) 0.61 - 1.24 mg/dL   Glucose, Bld 860 (H) 70 - 99 mg/dL    Comment: Glucose reference range applies only to samples taken after fasting for at least 8 hours.   Calcium, Ion 1.16 1.15 - 1.40 mmol/L   TCO2 25 22 - 32 mmol/L   Hemoglobin 9.2 (L) 13.0 - 17.0 g/dL   HCT 72.9 (L) 60.9 - 47.9 %  Lactic acid, plasma     Status: None   Collection Time: 12/30/23  5:40 AM  Result Value Ref Range   Lactic Acid, Venous 1.2 0.5 - 1.9 mmol/L    Comment: Performed at [facility], [address] [place], [place] [address]  Troponin I (High Sensitivity)     Status: None   Collection Time: 12/30/23  5:40 AM  Result Value Ref Range   Troponin I (High Sensitivity) 6 <18 ng/L    Comment: (NOTE) Elevated high sensitivity troponin I (hsTnI) values  and significant  changes across serial measurements may suggest ACS but many other  chronic and acute conditions are known to elevate hsTnI results.  Refer to the Links section for chest pain algorithms and additional  guidance. Performed at [facility], [address] [place], [place] [address]   ABO/Rh     Status: None   Collection Time: 12/30/23 10:19 AM  Result Value Ref Range   ABO/RH(D)      A POS Performed at [facility], [address] [place], [place] [address]     DG Abd Portable 1V Result Date: 12/30/2023 CLINICAL DATA:  Encounter for nasogastric tube placement EXAM: PORTABLE ABDOMEN - 1 VIEW COMPARISON:  None Available. FINDINGS: NG tube with tip in the gastric fundus. Side port below the GE junction. Dilated loop of small bowel in the upper abdomen measures 4 cm IMPRESSION: NG tube in stomach.  Side port below the GE junction. Electronically Signed   By: Jackquline Boxer M.D.   On: 12/30/2023 12:25   CT Angio Chest PE W and/or Wo Contrast Result Date: 12/30/2023 CLINICAL DATA:  PE suspected, abdominal pain, non-small-cell lung cancer * Tracking Code: BO * EXAM: CT ANGIOGRAPHY CHEST CT ABDOMEN AND PELVIS WITH CONTRAST TECHNIQUE: Multidetector CT imaging of the chest was performed using the standard protocol during bolus administration of intravenous contrast. Multiplanar CT image reconstructions and MIPs were obtained to evaluate the vascular anatomy. Multidetector CT imaging of the abdomen and pelvis was performed using the standard protocol during bolus administration of intravenous contrast. RADIATION DOSE REDUCTION: This exam was performed according to the departmental dose-optimization program which includes automated exposure control, adjustment of the mA and/or kV according to patient size and/or use of iterative reconstruction technique. CONTRAST:  OMNIPAQUE  IOHEXOL  350 MG/ML SOLN COMPARISON:  CT chest abdomen pelvis, 12/11/2023  FINDINGS: CT CHEST ANGIOGRAM FINDINGS Cardiovascular: Satisfactory opacification of the pulmonary arteries to the segmental level. No evidence of pulmonary embolism. Mild cardiomegaly. Three-vessel coronary artery calcifications. No pericardial effusion. Aortic atherosclerosis. Mediastinum/Nodes: No enlarged mediastinal, hilar, or axillary lymph nodes. Thyroid  gland, trachea, and esophagus demonstrate no significant findings. Lungs/Pleura: Diminished volume of a left pleural effusion. Otherwise unchanged findings when compared to recent restaging examination with treated masses in the left upper lobe and right lower lobe. Musculoskeletal: No chest wall abnormality. No acute osseous findings. Nondisplaced, subacute pathologic fracture of the posterior left third rib (series 4, image 36). Review of the MIP images confirms the above findings. CT ABDOMEN PELVIS FINDINGS Hepatobiliary: No solid  liver abnormality is seen. No gallstones, gallbladder wall thickening, or biliary dilatation. Pancreas: Unremarkable. No pancreatic ductal dilatation or surrounding inflammatory changes. Spleen: Normal in size without significant abnormality. Adrenals/Urinary Tract: Adrenal glands are unremarkable. Kidneys are normal, without renal calculi, solid lesion, or hydronephrosis. Bladder is unremarkable. Stomach/Bowel: Stomach is within normal limits. New wall thickening and inflammatory fat stranding about the cecum (series 2, image 47, series 8, image 93). Mildly distended, fluid-filled loops of mid to distal small bowel throughout the abdomen, largest, distal loops measuring up to 4.1 cm in caliber. The terminal ileum is thickened and fecalized (series 2, image 49). Colon is generally decompressed of the rectum with scattered gas and stool throughout. Sigmoid diverticulosis. Vascular/Lymphatic: Aortic atherosclerosis. No enlarged abdominal or pelvic lymph nodes. Reproductive: Prostatomegaly with biopsy marking clips and fiducials.  Other: Small fat containing left inguinal hernia.  No ascites. Musculoskeletal: No acute osseous findings. Unchanged sclerotic metastases of the posterior right ninth rib and T9 vertebral body. Unchanged sclerotic osseous metastases of the left sacrum and ilium. IMPRESSION: 1. Negative examination for pulmonary embolism. 2. New wall thickening and inflammatory fat stranding about the cecum, consistent with nonspecific infectious or inflammatory colitis. 3. Mildly distended, fluid-filled loops of mid to distal small bowel throughout the abdomen, largest, distal loops measuring up to 4.1 cm in caliber. The terminal ileum is thickened and fecalized. Findings suggest obstipation of the ileocecal valve secondary to colitis and/or associated infectious or inflammatory ileus. 4. Diminished volume of a left pleural effusion. Otherwise unchanged oncologic findings when compared to recent restaging examination with treated masses in the left upper lobe and right lower lobe. 5. Unchanged sclerotic osseous metastases of the posterior right ninth rib and T9 vertebral body. Unchanged sclerotic osseous metastases of the left sacrum and ilium. 6. Coronary artery disease. Aortic Atherosclerosis (ICD10-I70.0). Electronically Signed   By: Marolyn JONETTA Jaksch M.D.   On: 12/30/2023 06:51   CT ABDOMEN PELVIS W CONTRAST Result Date: 12/30/2023 CLINICAL DATA:  PE suspected, abdominal pain, non-small-cell lung cancer * Tracking Code: BO * EXAM: CT ANGIOGRAPHY CHEST CT ABDOMEN AND PELVIS WITH CONTRAST TECHNIQUE: Multidetector CT imaging of the chest was performed using the standard protocol during bolus administration of intravenous contrast. Multiplanar CT image reconstructions and MIPs were obtained to evaluate the vascular anatomy. Multidetector CT imaging of the abdomen and pelvis was performed using the standard protocol during bolus administration of intravenous contrast. RADIATION DOSE REDUCTION: This exam was performed according to the  departmental dose-optimization program which includes automated exposure control, adjustment of the mA and/or kV according to patient size and/or use of iterative reconstruction technique. CONTRAST:  OMNIPAQUE  IOHEXOL  350 MG/ML SOLN COMPARISON:  CT chest abdomen pelvis, 12/11/2023 FINDINGS: CT CHEST ANGIOGRAM FINDINGS Cardiovascular: Satisfactory opacification of the pulmonary arteries to the segmental level. No evidence of pulmonary embolism. Mild cardiomegaly. Three-vessel coronary artery calcifications. No pericardial effusion. Aortic atherosclerosis. Mediastinum/Nodes: No enlarged mediastinal, hilar, or axillary lymph nodes. Thyroid  gland, trachea, and esophagus demonstrate no significant findings. Lungs/Pleura: Diminished volume of a left pleural effusion. Otherwise unchanged findings when compared to recent restaging examination with treated masses in the left upper lobe and right lower lobe. Musculoskeletal: No chest wall abnormality. No acute osseous findings. Nondisplaced, subacute pathologic fracture of the posterior left third rib (series 4, image 36). Review of the MIP images confirms the above findings. CT ABDOMEN PELVIS FINDINGS Hepatobiliary: No solid liver abnormality is seen. No gallstones, gallbladder wall thickening, or biliary dilatation. Pancreas: Unremarkable. No pancreatic ductal dilatation or  surrounding inflammatory changes. Spleen: Normal in size without significant abnormality. Adrenals/Urinary Tract: Adrenal glands are unremarkable. Kidneys are normal, without renal calculi, solid lesion, or hydronephrosis. Bladder is unremarkable. Stomach/Bowel: Stomach is within normal limits. New wall thickening and inflammatory fat stranding about the cecum (series 2, image 47, series 8, image 93). Mildly distended, fluid-filled loops of mid to distal small bowel throughout the abdomen, largest, distal loops measuring up to 4.1 cm in caliber. The terminal ileum is thickened and fecalized (series  2, image 49). Colon is generally decompressed of the rectum with scattered gas and stool throughout. Sigmoid diverticulosis. Vascular/Lymphatic: Aortic atherosclerosis. No enlarged abdominal or pelvic lymph nodes. Reproductive: Prostatomegaly with biopsy marking clips and fiducials. Other: Small fat containing left inguinal hernia.  No ascites. Musculoskeletal: No acute osseous findings. Unchanged sclerotic metastases of the posterior right ninth rib and T9 vertebral body. Unchanged sclerotic osseous metastases of the left sacrum and ilium. IMPRESSION: 1. Negative examination for pulmonary embolism. 2. New wall thickening and inflammatory fat stranding about the cecum, consistent with nonspecific infectious or inflammatory colitis. 3. Mildly distended, fluid-filled loops of mid to distal small bowel throughout the abdomen, largest, distal loops measuring up to 4.1 cm in caliber. The terminal ileum is thickened and fecalized. Findings suggest obstipation of the ileocecal valve secondary to colitis and/or associated infectious or inflammatory ileus. 4. Diminished volume of a left pleural effusion. Otherwise unchanged oncologic findings when compared to recent restaging examination with treated masses in the left upper lobe and right lower lobe. 5. Unchanged sclerotic osseous metastases of the posterior right ninth rib and T9 vertebral body. Unchanged sclerotic osseous metastases of the left sacrum and ilium. 6. Coronary artery disease. Aortic Atherosclerosis (ICD10-I70.0). Electronically Signed   By: Marolyn JONETTA Jaksch M.D.   On: 12/30/2023 06:51   DG ABD ACUTE 2+V W 1V CHEST Result Date: 12/30/2023 CLINICAL DATA:  Abdominal pain. EXAM: DG ABDOMEN ACUTE WITH 1 VIEW CHEST COMPARISON:  Chest abdomen pelvis CT 12/11/2023 FINDINGS: Low lung volumes. Ill-defined opacities are seen in the right parahilar and left mid lungs with associated fiducial markers. No pulmonary edema or substantial pleural effusion. The cardio  pericardial silhouette is enlarged. No acute bony abnormality. Supine abdomen shows gaseous distention of small bowel in the central abdomen measuring up to 4.2 cm diameter. Lucency under the left hemidiaphragm is probably related to gaseous distention of the stomach . Free air under the left hemidiaphragm is considered less likely but not excluded. Degenerative changes are seen in the lumbar spine. IMPRESSION: 1. Gaseous distention of small bowel in the central abdomen measuring up to 4.2 cm diameter. Imaging features are compatible with small bowel obstruction. CT abdomen and pelvis with oral and intravenous contrast could be used to further evaluate. 2. Lucency under the left hemidiaphragm is probably related to gaseous distention of the stomach . Free gas under the left hemidiaphragm is considered less likely but not excluded. This could also be further assessed at the time of CT. Electronically Signed   By: Camellia Candle M.D.   On: 12/30/2023 05:33    ROS negative except above he does use some narcotics at home for pain control Blood pressure 127/74, pulse 62, temperature 97.6 F (36.4 C), temperature source Oral, resp. rate 16, height 6' (1.829 m), weight 95.3 kg, SpO2 100%. Physical Exam in good spirits vital signs stable no acute distress he tells me his abdomen is much less distended still a little tender right greater than left but no rebound x-ray and  CT reviewed previous PET scan 7 months ago reviewed labs reviewed white count okay creatinine slightly over baseline calcium okay  Assessment/Plan: Bowel obstruction questionable etiology Plan: Appreciate general surgery's note and help and consider consulting oncology to see if this can be a side effect 7 months later from his immunotherapy although I doubt it would come on all of a sudden like this did and I will check on tomorrow and we will try some Cepacol spray to help ease of the discomfort from the NG and we answered all of his and the  family's questions  Terrina Docter E 12/30/2023, 1:25 PM

## 2023-12-31 ENCOUNTER — Inpatient Hospital Stay (HOSPITAL_COMMUNITY)

## 2023-12-31 DIAGNOSIS — K56609 Unspecified intestinal obstruction, unspecified as to partial versus complete obstruction: Secondary | ICD-10-CM | POA: Diagnosis not present

## 2023-12-31 LAB — CBC
HCT: 28.1 % — ABNORMAL LOW (ref 39.0–52.0)
Hemoglobin: 9 g/dL — ABNORMAL LOW (ref 13.0–17.0)
MCH: 28.5 pg (ref 26.0–34.0)
MCHC: 32 g/dL (ref 30.0–36.0)
MCV: 88.9 fL (ref 80.0–100.0)
Platelets: 124 K/uL — ABNORMAL LOW (ref 150–400)
RBC: 3.16 MIL/uL — ABNORMAL LOW (ref 4.22–5.81)
RDW: 14.9 % (ref 11.5–15.5)
WBC: 5.1 K/uL (ref 4.0–10.5)
nRBC: 0 % (ref 0.0–0.2)

## 2023-12-31 LAB — URINALYSIS, ROUTINE W REFLEX MICROSCOPIC
Bilirubin Urine: NEGATIVE
Glucose, UA: NEGATIVE mg/dL
Hgb urine dipstick: NEGATIVE
Ketones, ur: NEGATIVE mg/dL
Leukocytes,Ua: NEGATIVE
Nitrite: NEGATIVE
Protein, ur: NEGATIVE mg/dL
Specific Gravity, Urine: 1.027 (ref 1.005–1.030)
pH: 5 (ref 5.0–8.0)

## 2023-12-31 LAB — IRON AND TIBC
Iron: 18 ug/dL — ABNORMAL LOW (ref 45–182)
Saturation Ratios: 8 % — ABNORMAL LOW (ref 17.9–39.5)
TIBC: 241 ug/dL — ABNORMAL LOW (ref 250–450)
UIBC: 223 ug/dL

## 2023-12-31 LAB — BASIC METABOLIC PANEL WITH GFR
Anion gap: 6 (ref 5–15)
BUN: 32 mg/dL — ABNORMAL HIGH (ref 8–23)
CO2: 24 mmol/L (ref 22–32)
Calcium: 7.8 mg/dL — ABNORMAL LOW (ref 8.9–10.3)
Chloride: 106 mmol/L (ref 98–111)
Creatinine, Ser: 1.22 mg/dL (ref 0.61–1.24)
GFR, Estimated: 59 mL/min — ABNORMAL LOW (ref >60)
Glucose, Bld: 131 mg/dL — ABNORMAL HIGH (ref 70–99)
Potassium: 4.5 mmol/L (ref 3.5–5.1)
Sodium: 136 mmol/L (ref 135–145)

## 2023-12-31 MED ORDER — LACTATED RINGERS IV SOLN: INTRAVENOUS | Status: DC

## 2023-12-31 MED ORDER — DIATRIZOATE MEGLUMINE & SODIUM 66-10 % PO SOLN
90.0000 mL | Freq: Once | ORAL | Status: AC
  Administered 2023-12-31: 90 mL via NASOGASTRIC
  Filled 2023-12-31: qty 90

## 2023-12-31 MED ORDER — LACTATED RINGERS IV SOLN: INTRAVENOUS | Status: AC

## 2023-12-31 MED ORDER — HEPARIN SODIUM (PORCINE) 5000 UNIT/ML IJ SOLN
5000.0000 [IU] | Freq: Three times a day (TID) | INTRAMUSCULAR | Status: DC
  Administered 2024-01-01 – 2024-01-08 (×22): 5000 [IU] via SUBCUTANEOUS
  Filled 2023-12-31 (×23): qty 1

## 2023-12-31 NOTE — Plan of Care (Signed)

## 2023-12-31 NOTE — Progress Notes (Signed)
 PROGRESS NOTE    Patient: Chad Moreno                            PCP: [name], [name], MD                    DOB: 09/27/1940            DOA: 12/30/2023 FMW:[id number]             DOS: 12/31/2023, 11:20 AM   LOS: 1 day   Date of Service: The patient was seen and examined on 12/31/2023  Subjective:   The patient was seen and examined this morning.  In no acute distress Hemodynamically stable. NG tube in place Reporting of gas but no bowel movements No issues overnight .  Brief Narrative:   Chad Moreno is a 83 year old man medical history of metastatic small cell lung cancer s/p SRB Teague completed 10/2022 and currently onencorafenib and binimetinib  started on 06/2023, chronic thrombocytopenia, essential hypertension and GERD.  Presented and complaining of diffuse abdominal pain for 1 day, abdominal discomfort, distention, nausea and vomiting x 2.  Also reported several piece of dark and loose stool.  Concern for cecal colitis and bowel obstruction.   ED:  patient is borderline hypotensive. Initial lactic acid 2.3 improved to 1.2. CBC normal WBC count, stable H&H 10.3 and 30 and low platelet count at baseline.  CMP showing elevated creatinine 1.32 otherwise unremarkable. Troponin x 2 within normal range.  Normal BNP. Patient is FOBT positive however hemoglobin is stable.  X-ray abdomen show gaseous distention of the small bowel measuring up to 4.2 cm compatible with small bowel obstruction.Lucency under the left hemidiaphragm is probably related to gaseous distention of the stomach . Free gas under the left hemidiaphragm is considered less likely but not excluded. This could also be further assessed at the time of CT.  CTA: CT abd/ Pelvis :  no PE, New wall thickening and inflammatory fat stranding about the cecum, consistent with nonspecific infectious or inflammatory colitis. 3. Mildly distended, fluid-filled loops of mid to distal small bowel throughout the abdomen, largest,  distal loops measuring up to 4.1 cm in caliber. Diminished volume of a left pleural effusion   Unchanged sclerotic osseous metastases of the posterior right ninth rib and T9 vertebral body. Unchanged sclerotic osseous metastases of the left sacrum and ilium  - Received 1 L of LR bolus, IV Protonix  and Zofran . NG tube has been ordered by ED physician. Dr. Carita consulted and sent secure chat message to general surgery and [facility] as well.  Hospitalist has been consulted for further evaluation management of small bowel obstruction, AKI and GI bleed.     Assessment & Plan:   Principal Problem:   SBO (small bowel obstruction) (HCC) Active Problems:   Non-small cell cancer of left lung (HCC)   Metastasis to bone (HCC)  Bowel obstruction - CT abdomen reviewed,New wall thickening and inflammatory fat stranding about the cecum, consistent with nonspecific infectious or inflammatory colitis. 3. Mildly distended, fluid-filled loops of mid to distal small bowel throughout the abdomen, largest, distal loops measuring up to 4.1 cm in caliber.  -S/P NG tube placement  = to wall suction - Improved nausea, no vomiting  -IV fluid -N.p.o. -Hold home aspirin -Pain and nausea medication as needed - Consulted [facility] Dr. Rosalie, general surgery Dr. Chaya   Possible GI bleed/acute on chronic anemia - Monitoring H&H  closely - Active signs of bleeding - Pending Hemoccult - Initiating IV Protonix     Latest Ref Rng & Units 12/31/2023    4:40 AM 12/30/2023    5:38 AM 12/30/2023    5:00 AM  CBC  WBC 4.0 - 10.5 K/uL 5.1   8.2   Hemoglobin 13.0 - 17.0 g/dL 9.0  9.2  89.9   Hematocrit 39.0 - 52.0 % 28.1  27.0  30.3   Platelets 150 - 400 K/uL 124   149       Non-small cell lung cancer -hold immunotherapy, his oncologist Dr. [name] added to inpatient treatment team   Hypertension -NPO- hold home amlodipine and losartan while n.p.o., IV hydralazine  as needed in the meantime    AKI -Improving -in the setting of baseline normal renal function, likely due to vomiting and volume loss. -Improved -Hold nephrotoxins including losartan -IV fluids -Monitor renal function with daily labs Lab Results  Component Value Date   CREATININE 1.22 12/31/2023   CREATININE 1.40 (H) 12/30/2023   CREATININE 1.32 (H) 12/30/2023     ----------------------------------------------------------------------------------------------------------- Nutritional status:  The patient's BMI is: Body mass index is 28.48 kg/m. I agree with the assessment and plan as outlined -----------------------------------------------------------------------------------------------------------  DVT prophylaxis:  heparin  injection 5,000 Units Start: 01/01/24 0600 Place and maintain sequential compression device Start: 12/31/23 0733   Code Status:   Code Status: Full Code  Family Communication: No family member present at bedside- Admission status:   Status is: Inpatient Remains inpatient appropriate because: Needing IVF, surgical evaluation monitoring SBO.   Disposition: From  - home             Planning for discharge in 1-2 days: to   Procedures:   No admission procedures for hospital encounter.   Antimicrobials:  Anti-infectives (From admission, onward)    None        Medication:   [START ON 01/01/2024] heparin   5,000 Units Subcutaneous Q8H    acetaminophen  **OR** acetaminophen , albuterol , hydrALAZINE , HYDROmorphone  (DILAUDID ) injection, LORazepam , ondansetron  **OR** ondansetron  (ZOFRAN ) IV, phenol, promethazine (PHENERGAN) injection (IM or IVPB)   Objective:   Vitals:   12/30/23 2122 12/31/23 0147 12/31/23 0455 12/31/23 0948  BP: 121/64 127/66 (!) 119/51 (!) 141/71  Pulse: 84 79 78 70  Resp: 17 16 18 18   Temp: 99.3 F (37.4 C) 97.8 F (36.6 C) 98.1 F (36.7 C) 98.2 F (36.8 C)  TempSrc: Oral Oral Oral Oral  SpO2: 99% 99% 97% 100%  Weight:      Height:         Intake/Output Summary (Last 24 hours) at 12/31/2023 1120 Last data filed at 12/31/2023 1000 Gross per 24 hour  Intake 2848.66 ml  Output 3050 ml  Net -201.34 ml   Filed Weights   12/30/23 0315  Weight: 95.3 kg     Physical examination:   Constitution:  Alert, cooperative, no distress,  Appears calm and comfortable   HEENT:        Normocephalic, PERRL, otherwise with in Normal limits  Chest:         Chest symmetric Cardio vascular:  S1/S2, RRR, No murmure, No Rubs or Gallops  pulmonary: Clear to auscultation bilaterally, respirations unlabored, negative wheezes / crackles Abdomen:   Soft, non-tender, non-distended, bowel sounds,no masses, no organomegaly Muscular skeletal: Limited exam - in bed, able to move all 4 extremities,   Neuro: CNII-XII intact. , normal motor and sensation, reflexes intact  Extremities: No pitting edema lower extremities, +2 pulses  Skin: Dry,  warm to touch, negative for any Rashes, No open wounds Wounds: per nursing documentation   ------------------------------------------------------------------------------------------------------------------------------------------    LABs:     Latest Ref Rng & Units 12/31/2023    4:40 AM 12/30/2023    5:38 AM 12/30/2023    5:00 AM  CBC  WBC 4.0 - 10.5 K/uL 5.1   8.2   Hemoglobin 13.0 - 17.0 g/dL 9.0  9.2  89.9   Hematocrit 39.0 - 52.0 % 28.1  27.0  30.3   Platelets 150 - 400 K/uL 124   149       Latest Ref Rng & Units 12/31/2023    4:40 AM 12/30/2023    5:38 AM 12/30/2023    5:34 AM  CMP  Glucose 70 - 99 mg/dL 868  860  846   BUN 8 - 23 mg/dL 32  25  28   Creatinine 0.61 - 1.24 mg/dL 8.77  8.59  8.67   Sodium 135 - 145 mmol/L 136  136  135   Potassium 3.5 - 5.1 mmol/L 4.5  4.2  4.4   Chloride 98 - 111 mmol/L 106  100  100   CO2 22 - 32 mmol/L 24   22   Calcium 8.9 - 10.3 mg/dL 7.8   9.4   Total Protein 6.5 - 8.1 g/dL   7.3   Total Bilirubin 0.0 - 1.2 mg/dL   1.0   Alkaline Phos 38 - 126 U/L   62   AST  15 - 41 U/L   33   ALT 0 - 44 U/L   18        Micro Results Recent Results (from the past 240 hours)  Blood culture (routine x 2)     Status: None (Preliminary result)   Collection Time: 12/30/23  4:00 AM   Specimen: BLOOD  Result Value Ref Range Status   Specimen Description   Final    BLOOD LEFT ANTECUBITAL Performed at [facility], [address], [place], [place] [address]    Special Requests   Final    BOTTLES DRAWN AEROBIC AND ANAEROBIC Blood Culture adequate volume Performed at [facility], [address] [place], [place] [address]    Culture   Final    NO GROWTH 1 DAY Performed at [facility], [address], [place], [place] [address]    Report Status PENDING  Incomplete  Blood culture (routine x 2)     Status: None (Preliminary result)   Collection Time: 12/30/23  4:05 AM   Specimen: BLOOD LEFT HAND  Result Value Ref Range Status   Specimen Description   Final    BLOOD LEFT HAND Performed at [facility], [address], [place], [place] [address]    Special Requests   Final    BOTTLES DRAWN AEROBIC AND ANAEROBIC Blood Culture adequate volume Performed at [facility], [address], [place], [place] [address]    Culture   Final    NO GROWTH 1 DAY Performed at [facility], [address] [place], [place] [address]    Report Status PENDING  Incomplete    Radiology Reports DG Abd Portable 1V Result Date: 12/31/2023 CLINICAL DATA:  Small bowel obstruction. EXAM: PORTABLE ABDOMEN - 1 VIEW COMPARISON:  Abdominal radiograph dated 12/30/2023. FINDINGS: Enteric tube with tip in the left upper abdomen likely in the proximal stomach. Progression of small bowel dilatation measuring up to 5.6 cm in diameter.  No free air noted. Excreted contrast noted in the urinary bladder. Degenerative changes spine. No acute osseous pathology. IMPRESSION: Progression of small bowel dilatation.  Electronically Signed   By: Vanetta Chou M.D.   On: 12/31/2023 11:02   DG Abd Portable 1V Result Date: 12/30/2023 CLINICAL DATA:  Encounter for nasogastric tube placement EXAM: PORTABLE ABDOMEN - 1 VIEW COMPARISON:  None Available. FINDINGS: NG tube with tip in the gastric fundus. Side port below the GE junction. Dilated loop of small bowel in the upper abdomen measures 4 cm IMPRESSION: NG tube in stomach.  Side port below the GE junction. Electronically Signed   By: Jackquline Boxer M.D.   On: 12/30/2023 12:25    SIGNED: Adriana DELENA Grams, MD, FHM. FAAFP. Chad Moreno - [facility] Time spent - 55 min.  In seeing, evaluating and examining the patient. Reviewing medical records, labs, drawn plan of care. [facility],  Pager (please use [email or web address] to page/ text) Please use Epic Secure Chat for non-urgent communication (7AM-7PM)  If 7PM-7AM, please contact night-coverage [email or web address], 12/31/2023, 11:20 AM

## 2023-12-31 NOTE — Progress Notes (Signed)
 Chad Moreno 10:40 AM  Subjective: Patient doing better and appreciates surgical note and no new complaints and is passing some gas and still has some pain and does not like the NG which output seems to be slowing down  Objective: Vital signs stable afebrile no acute distress in good spirits abdomen is mildly tender throughout without rebound but softer potassium okay BUN slight increased creatinine slight decrease iron indices more compatible with chronic disease white count normal x-ray reviewed agree significant small bowel dilation and air still   Assessment: Small bowel obstruction slow to resolve  Plan: Await oncology's opinion see yesterday's note agree with surgical plan will check on tomorrow  [facility]  office [phone] After 5PM or if no answer call [phone]

## 2023-12-31 NOTE — Progress Notes (Signed)
   12/31/23 1302  TOC Brief Assessment  Insurance and Status Reviewed  Patient has primary care physician Yes  Home environment has been reviewed home with spouse  Prior level of function: independent  Prior/Current Home Services No current home services  Social Drivers of Health Review SDOH reviewed no interventions necessary  Readmission risk has been reviewed Yes  Transition of care needs no transition of care needs at this time

## 2023-12-31 NOTE — Progress Notes (Signed)
 Subjective: Still with some abdominal pain today.  + flatus, but no BM.  NGT still with some feculent appearing output.  Wife at bedside.  C/o of urinary burning.  ROS: See above, otherwise other systems negative  Objective: Vital signs in last 24 hours: Temp:  [97.6 F (36.4 C)-99.3 F (37.4 C)] 98.1 F (36.7 C) (07/08 0455) Pulse Rate:  [62-87] 78 (07/08 0455) Resp:  [11-18] 18 (07/08 0455) BP: (93-144)/(51-89) 119/51 (07/08 0455) SpO2:  [91 %-100 %] 97 % (07/08 0455) Last BM Date : 12/29/23  Intake/Output from previous day: 07/07 0701 - 07/08 0700 In: 2848.7 [P.O.:120; I.V.:2728.7] Out: 2700 [Urine:700; Emesis/NG output:2000] Intake/Output this shift: No intake/output data recorded.  PE: Gen: NAD Lungs: effort nonlabored, still on Dickens Abd: soft, but actually more tender in central and epigastrium and not tender in RLQ today. Much less distended today than yesterday  Lab Results:  Recent Labs    12/30/23 0500 12/30/23 0538 12/31/23 0440  WBC 8.2  --  5.1  HGB 10.0* 9.2* 9.0*  HCT 30.3* 27.0* 28.1*  PLT 149*  --  124*   BMET Recent Labs    12/30/23 0534 12/30/23 0538 12/31/23 0440  NA 135 136 136  K 4.4 4.2 4.5  CL 100 100 106  CO2 22  --  24  GLUCOSE 153* 139* 131*  BUN 28* 25* 32*  CREATININE 1.32* 1.40* 1.22  CALCIUM 9.4  --  7.8*   PT/INR Recent Labs    12/30/23 0402  LABPROT 12.4  INR 0.9   CMP     Component Value Date/Time   NA 136 12/31/2023 0440   K 4.5 12/31/2023 0440   CL 106 12/31/2023 0440   CO2 24 12/31/2023 0440   GLUCOSE 131 (H) 12/31/2023 0440   BUN 32 (H) 12/31/2023 0440   CREATININE 1.22 12/31/2023 0440   CREATININE 1.08 12/18/2023 1100   CALCIUM 7.8 (L) 12/31/2023 0440   PROT 7.3 12/30/2023 0534   ALBUMIN 3.5 12/30/2023 0534   AST 33 12/30/2023 0534   AST 15 12/18/2023 1100   ALT 18 12/30/2023 0534   ALT 12 12/18/2023 1100   ALKPHOS 62 12/30/2023 0534   BILITOT 1.0 12/30/2023 0534   BILITOT 0.3 12/18/2023  1100   GFRNONAA 59 (L) 12/31/2023 0440   GFRNONAA >60 12/18/2023 1100   GFRAA >60 03/11/2019 2058   Lipase     Component Value Date/Time   LIPASE 25 12/30/2023 0402       Studies/Results: DG Abd Portable 1V Result Date: 12/30/2023 CLINICAL DATA:  Encounter for nasogastric tube placement EXAM: PORTABLE ABDOMEN - 1 VIEW COMPARISON:  None Available. FINDINGS: NG tube with tip in the gastric fundus. Side port below the GE junction. Dilated loop of small bowel in the upper abdomen measures 4 cm IMPRESSION: NG tube in stomach.  Side port below the GE junction. Electronically Signed   By: Jackquline Boxer M.D.   On: 12/30/2023 12:25   CT Angio Chest PE W and/or Wo Contrast Result Date: 12/30/2023 CLINICAL DATA:  PE suspected, abdominal pain, non-small-cell lung cancer * Tracking Code: BO * EXAM: CT ANGIOGRAPHY CHEST CT ABDOMEN AND PELVIS WITH CONTRAST TECHNIQUE: Multidetector CT imaging of the chest was performed using the standard protocol during bolus administration of intravenous contrast. Multiplanar CT image reconstructions and MIPs were obtained to evaluate the vascular anatomy. Multidetector CT imaging of the abdomen and pelvis was performed using the standard protocol during bolus administration of intravenous  contrast. RADIATION DOSE REDUCTION: This exam was performed according to the departmental dose-optimization program which includes automated exposure control, adjustment of the mA and/or kV according to patient size and/or use of iterative reconstruction technique. CONTRAST:  OMNIPAQUE  IOHEXOL  350 MG/ML SOLN COMPARISON:  CT chest abdomen pelvis, 12/11/2023 FINDINGS: CT CHEST ANGIOGRAM FINDINGS Cardiovascular: Satisfactory opacification of the pulmonary arteries to the segmental level. No evidence of pulmonary embolism. Mild cardiomegaly. Three-vessel coronary artery calcifications. No pericardial effusion. Aortic atherosclerosis. Mediastinum/Nodes: No enlarged mediastinal, hilar, or  axillary lymph nodes. Thyroid  gland, trachea, and esophagus demonstrate no significant findings. Lungs/Pleura: Diminished volume of a left pleural effusion. Otherwise unchanged findings when compared to recent restaging examination with treated masses in the left upper lobe and right lower lobe. Musculoskeletal: No chest wall abnormality. No acute osseous findings. Nondisplaced, subacute pathologic fracture of the posterior left third rib (series 4, image 36). Review of the MIP images confirms the above findings. CT ABDOMEN PELVIS FINDINGS Hepatobiliary: No solid liver abnormality is seen. No gallstones, gallbladder wall thickening, or biliary dilatation. Pancreas: Unremarkable. No pancreatic ductal dilatation or surrounding inflammatory changes. Spleen: Normal in size without significant abnormality. Adrenals/Urinary Tract: Adrenal glands are unremarkable. Kidneys are normal, without renal calculi, solid lesion, or hydronephrosis. Bladder is unremarkable. Stomach/Bowel: Stomach is within normal limits. New wall thickening and inflammatory fat stranding about the cecum (series 2, image 47, series 8, image 93). Mildly distended, fluid-filled loops of mid to distal small bowel throughout the abdomen, largest, distal loops measuring up to 4.1 cm in caliber. The terminal ileum is thickened and fecalized (series 2, image 49). Colon is generally decompressed of the rectum with scattered gas and stool throughout. Sigmoid diverticulosis. Vascular/Lymphatic: Aortic atherosclerosis. No enlarged abdominal or pelvic lymph nodes. Reproductive: Prostatomegaly with biopsy marking clips and fiducials. Other: Small fat containing left inguinal hernia.  No ascites. Musculoskeletal: No acute osseous findings. Unchanged sclerotic metastases of the posterior right ninth rib and T9 vertebral body. Unchanged sclerotic osseous metastases of the left sacrum and ilium. IMPRESSION: 1. Negative examination for pulmonary embolism. 2. New wall  thickening and inflammatory fat stranding about the cecum, consistent with nonspecific infectious or inflammatory colitis. 3. Mildly distended, fluid-filled loops of mid to distal small bowel throughout the abdomen, largest, distal loops measuring up to 4.1 cm in caliber. The terminal ileum is thickened and fecalized. Findings suggest obstipation of the ileocecal valve secondary to colitis and/or associated infectious or inflammatory ileus. 4. Diminished volume of a left pleural effusion. Otherwise unchanged oncologic findings when compared to recent restaging examination with treated masses in the left upper lobe and right lower lobe. 5. Unchanged sclerotic osseous metastases of the posterior right ninth rib and T9 vertebral body. Unchanged sclerotic osseous metastases of the left sacrum and ilium. 6. Coronary artery disease. Aortic Atherosclerosis (ICD10-I70.0). Electronically Signed   By: Marolyn JONETTA Jaksch M.D.   On: 12/30/2023 06:51   CT ABDOMEN PELVIS W CONTRAST Result Date: 12/30/2023 CLINICAL DATA:  PE suspected, abdominal pain, non-small-cell lung cancer * Tracking Code: BO * EXAM: CT ANGIOGRAPHY CHEST CT ABDOMEN AND PELVIS WITH CONTRAST TECHNIQUE: Multidetector CT imaging of the chest was performed using the standard protocol during bolus administration of intravenous contrast. Multiplanar CT image reconstructions and MIPs were obtained to evaluate the vascular anatomy. Multidetector CT imaging of the abdomen and pelvis was performed using the standard protocol during bolus administration of intravenous contrast. RADIATION DOSE REDUCTION: This exam was performed according to the departmental dose-optimization program which includes automated exposure control,  adjustment of the mA and/or kV according to patient size and/or use of iterative reconstruction technique. CONTRAST:  OMNIPAQUE  IOHEXOL  350 MG/ML SOLN COMPARISON:  CT chest abdomen pelvis, 12/11/2023 FINDINGS: CT CHEST ANGIOGRAM FINDINGS  Cardiovascular: Satisfactory opacification of the pulmonary arteries to the segmental level. No evidence of pulmonary embolism. Mild cardiomegaly. Three-vessel coronary artery calcifications. No pericardial effusion. Aortic atherosclerosis. Mediastinum/Nodes: No enlarged mediastinal, hilar, or axillary lymph nodes. Thyroid  gland, trachea, and esophagus demonstrate no significant findings. Lungs/Pleura: Diminished volume of a left pleural effusion. Otherwise unchanged findings when compared to recent restaging examination with treated masses in the left upper lobe and right lower lobe. Musculoskeletal: No chest wall abnormality. No acute osseous findings. Nondisplaced, subacute pathologic fracture of the posterior left third rib (series 4, image 36). Review of the MIP images confirms the above findings. CT ABDOMEN PELVIS FINDINGS Hepatobiliary: No solid liver abnormality is seen. No gallstones, gallbladder wall thickening, or biliary dilatation. Pancreas: Unremarkable. No pancreatic ductal dilatation or surrounding inflammatory changes. Spleen: Normal in size without significant abnormality. Adrenals/Urinary Tract: Adrenal glands are unremarkable. Kidneys are normal, without renal calculi, solid lesion, or hydronephrosis. Bladder is unremarkable. Stomach/Bowel: Stomach is within normal limits. New wall thickening and inflammatory fat stranding about the cecum (series 2, image 47, series 8, image 93). Mildly distended, fluid-filled loops of mid to distal small bowel throughout the abdomen, largest, distal loops measuring up to 4.1 cm in caliber. The terminal ileum is thickened and fecalized (series 2, image 49). Colon is generally decompressed of the rectum with scattered gas and stool throughout. Sigmoid diverticulosis. Vascular/Lymphatic: Aortic atherosclerosis. No enlarged abdominal or pelvic lymph nodes. Reproductive: Prostatomegaly with biopsy marking clips and fiducials. Other: Small fat containing left inguinal  hernia.  No ascites. Musculoskeletal: No acute osseous findings. Unchanged sclerotic metastases of the posterior right ninth rib and T9 vertebral body. Unchanged sclerotic osseous metastases of the left sacrum and ilium. IMPRESSION: 1. Negative examination for pulmonary embolism. 2. New wall thickening and inflammatory fat stranding about the cecum, consistent with nonspecific infectious or inflammatory colitis. 3. Mildly distended, fluid-filled loops of mid to distal small bowel throughout the abdomen, largest, distal loops measuring up to 4.1 cm in caliber. The terminal ileum is thickened and fecalized. Findings suggest obstipation of the ileocecal valve secondary to colitis and/or associated infectious or inflammatory ileus. 4. Diminished volume of a left pleural effusion. Otherwise unchanged oncologic findings when compared to recent restaging examination with treated masses in the left upper lobe and right lower lobe. 5. Unchanged sclerotic osseous metastases of the posterior right ninth rib and T9 vertebral body. Unchanged sclerotic osseous metastases of the left sacrum and ilium. 6. Coronary artery disease. Aortic Atherosclerosis (ICD10-I70.0). Electronically Signed   By: Marolyn JONETTA Jaksch M.D.   On: 12/30/2023 06:51   DG ABD ACUTE 2+V W 1V CHEST Result Date: 12/30/2023 CLINICAL DATA:  Abdominal pain. EXAM: DG ABDOMEN ACUTE WITH 1 VIEW CHEST COMPARISON:  Chest abdomen pelvis CT 12/11/2023 FINDINGS: Low lung volumes. Ill-defined opacities are seen in the right parahilar and left mid lungs with associated fiducial markers. No pulmonary edema or substantial pleural effusion. The cardio pericardial silhouette is enlarged. No acute bony abnormality. Supine abdomen shows gaseous distention of small bowel in the central abdomen measuring up to 4.2 cm diameter. Lucency under the left hemidiaphragm is probably related to gaseous distention of the stomach . Free air under the left hemidiaphragm is considered less likely  but not excluded. Degenerative changes are seen in the lumbar spine.  IMPRESSION: 1. Gaseous distention of small bowel in the central abdomen measuring up to 4.2 cm diameter. Imaging features are compatible with small bowel obstruction. CT abdomen and pelvis with oral and intravenous contrast could be used to further evaluate. 2. Lucency under the left hemidiaphragm is probably related to gaseous distention of the stomach . Free gas under the left hemidiaphragm is considered less likely but not excluded. This could also be further assessed at the time of CT. Electronically Signed   By: Camellia Candle M.D.   On: 12/30/2023 05:33    Anti-infectives: Anti-infectives (From admission, onward)    None        Assessment/Plan Small bowel obstruction likely secondary to cecal colitis of unclear etiology  -repeat x-ray this am with some air in right colon, but SB is still very dilated.  Cont NGT -will proceed with SBO protocol  -WBC still normal -mobilize as able -still suspect this area is more related to inflammation and will likely improve with conservative management, but will continue to closely monitor -d/w primary service  FEN - NPO/NGT/IVFs per primary VTE - heparin  ID - none currently  AKI - improved Dysuria - relayed message to primary service Non-small cell lung cancer, mets - on immunotherapy Prostate cancer - hx of radiation HTN  I reviewed nursing notes, hospitalist notes, last 24 h vitals and pain scores, last 48 h intake and output, last 24 h labs and trends, and last 24 h imaging results.   LOS: 1 day    Burnard FORBES Banter , [facility] 12/31/2023, 8:36 AM Please see Amion for pager number during day hours 7:00am-4:30pm or 7:00am -11:30am on weekends

## 2024-01-01 ENCOUNTER — Telehealth: Payer: Self-pay

## 2024-01-01 ENCOUNTER — Inpatient Hospital Stay (HOSPITAL_COMMUNITY)

## 2024-01-01 ENCOUNTER — Other Ambulatory Visit: Payer: Self-pay | Admitting: Radiology

## 2024-01-01 ENCOUNTER — Telehealth (HOSPITAL_COMMUNITY): Payer: Self-pay

## 2024-01-01 ENCOUNTER — Encounter: Payer: Self-pay | Admitting: Internal Medicine

## 2024-01-01 DIAGNOSIS — K56609 Unspecified intestinal obstruction, unspecified as to partial versus complete obstruction: Secondary | ICD-10-CM | POA: Diagnosis not present

## 2024-01-01 DIAGNOSIS — C3492 Malignant neoplasm of unspecified part of left bronchus or lung: Secondary | ICD-10-CM | POA: Diagnosis not present

## 2024-01-01 DIAGNOSIS — C7951 Secondary malignant neoplasm of bone: Secondary | ICD-10-CM

## 2024-01-01 MED ORDER — DICLOFENAC SODIUM 1 % EX GEL
2.0000 g | Freq: Once | CUTANEOUS | Status: AC
  Administered 2024-01-01: 2 g via TOPICAL
  Filled 2024-01-01: qty 100

## 2024-01-01 NOTE — Progress Notes (Signed)
 [facility]                                                                               Chad Moreno, is a 83 y.o. male, DOB - 08-17-1940, FMW:[id number] Admit date - 12/30/2023    Outpatient Primary MD for the patient is [name], [name], MD  LOS - 2  days    Brief summary   Chad Moreno is a 83 year old man medical history of metastatic small cell lung cancer s/p SRB [place]ague completed 10/2022 and currently onencorafenib and binimetinib  started on 06/2023, chronic thrombocytopenia, essential hypertension and GERD.  Presented and complaining of diffuse abdominal pain for 1 day, abdominal discomfort, distention, nausea and vomiting x 2.  Also reported several piece of dark and loose stool.  Concern for cecal colitis and bowel obstruction.   X-ray abdomen show gaseous distention of the small bowel measuring up to 4.2 cm compatible with small bowel obstruction.Lucency under the left hemidiaphragm is probably related to gaseous distention of the stomach . Free gas under the left hemidiaphragm is considered less likely but not excluded. This could also be further assessed at the time of CT.  CTA: CT abd/ Pelvis :  no PE, New wall thickening and inflammatory fat stranding about the cecum, consistent with nonspecific infectious or inflammatory colitis. 3. Mildly distended, fluid-filled loops of mid to distal small bowel throughout the abdomen, largest, distal loops measuring up to 4.1 cm in caliber. Diminished volume of a left pleural effusion   Unchanged sclerotic osseous metastases of the posterior right ninth rib and T9 vertebral body. Unchanged sclerotic osseous metastases of the left sacrum and ilium.  Hospitalist has been consulted for further evaluation management of small bowel obstruction, AKI and GI bleed. General surgery on board.    Assessment & Plan    Assessment and Plan:  Bowel obstruction suspect from cecal colitis of unclear etiology.  S/p NG tube  placement connected to suction.  Gen surgery on board and appreciate recommendations.  Continue with conservative management  GI on board.    Non-small cell lung cancer  Follows up with Dr [name].  on immunotherapy which is on hold.  Hypertension Better controlled.    Acute kidney injury in the setting of vomiting and dehydration Continue with IV fluids Creatinine has improved and appears to be back at baseline   Heme positive stools.  Drop in hemoglobin from 10.7 to 9.  Continue to monitor.  Iron profile    Iron deficiency anemia/mild thrombocytopenia Continue to monitor and iron supplementation will be added on discharge  Estimated body mass index is 28.48 kg/m as calculated from the following:   Height as of this encounter: 6' (1.829 m).   Weight as of this encounter: 95.3 kg.  Code Status: full code.  DVT Prophylaxis:  heparin  injection 5,000 Units Start: 01/01/24 0600 Place and maintain sequential compression device Start: 12/31/23 0733   Level of Care: Level of care: Med-Surg Family Communication: family at bedside.   Disposition Plan:     Remains inpatient appropriate:  pending clinical improvement.   Procedures:  None.   Consultants:   Gen surgery Antimicrobials:   Anti-infectives (From admission, onward)  None        Medications  Scheduled Meds:  heparin   5,000 Units Subcutaneous Q8H   Continuous Infusions:  lactated ringers      lactated ringers  100 mL/hr at 01/01/24 0323   promethazine (PHENERGAN) injection (IM or IVPB)     PRN Meds:.acetaminophen  **OR** acetaminophen , albuterol , hydrALAZINE , HYDROmorphone  (DILAUDID ) injection, LORazepam , ondansetron  **OR** ondansetron  (ZOFRAN ) IV, phenol, promethazine (PHENERGAN) injection (IM or IVPB)    Subjective:   Hiawatha Merriott was seen and examined today.  Slowly improving.   Objective:   Vitals:   12/31/23 2025 01/01/24 0349 01/01/24 0355 01/01/24 0532  BP: 127/64 122/69  122/62   Pulse: 84 83  83  Resp: 17 18    Temp: 99.1 F (37.3 C) 98.8 F (37.1 C)  97.7 F (36.5 C)  TempSrc: Oral Oral  Oral  SpO2: 94% 91% 93% 96%  Weight:      Height:        Intake/Output Summary (Last 24 hours) at 01/01/2024 1336 Last data filed at 01/01/2024 1000 Gross per 24 hour  Intake 0 ml  Output 2050 ml  Net -2050 ml   Filed Weights   12/30/23 0315  Weight: 95.3 kg     Exam General: Alert and oriented x 3, NAD Cardiovascular: S1 S2 auscultated, no murmurs, RRR Respiratory: Clear to auscultation bilaterally, no wheezing, rales or rhonchi Gastrointestinal: Soft, mild tenderness , minimal BS, Not distended.  Ext: no pedal edema bilaterally Neuro: AAOx3,  Skin: No rashes Psych: Normal affect and demeanor, alert and oriented x3    Data Reviewed:  I have personally reviewed following labs and imaging studies   CBC Lab Results  Component Value Date   WBC 5.1 12/31/2023   RBC 3.16 (L) 12/31/2023   HGB 9.0 (L) 12/31/2023   HCT 28.1 (L) 12/31/2023   MCV 88.9 12/31/2023   MCH 28.5 12/31/2023   PLT 124 (L) 12/31/2023   MCHC 32.0 12/31/2023   RDW 14.9 12/31/2023   LYMPHSABS 0.3 (L) 12/30/2023   MONOABS 0.6 12/30/2023   EOSABS 0.0 12/30/2023   BASOSABS 0.0 12/30/2023     Last metabolic panel Lab Results  Component Value Date   NA 136 12/31/2023   K 4.5 12/31/2023   CL 106 12/31/2023   CO2 24 12/31/2023   BUN 32 (H) 12/31/2023   CREATININE 1.22 12/31/2023   GLUCOSE 131 (H) 12/31/2023   GFRNONAA 59 (L) 12/31/2023   GFRAA >60 03/11/2019   CALCIUM 7.8 (L) 12/31/2023   PROT 7.3 12/30/2023   ALBUMIN 3.5 12/30/2023   BILITOT 1.0 12/30/2023   ALKPHOS 62 12/30/2023   AST 33 12/30/2023   ALT 18 12/30/2023   ANIONGAP 6 12/31/2023    CBG (last 3)  No results for input(s): GLUCAP in the last 72 hours.    Coagulation Profile: Recent Labs  Lab 12/30/23 0402  INR 0.9     Radiology Studies: DG Abd Portable 1V Result Date: 01/01/2024 CLINICAL DATA:  Of  the if present present EXAM: PORTABLE ABDOMEN - 1 VIEW COMPARISON:  Portable abdomen film yesterday at 4:02 p.m. FINDINGS: 4:15 a.m. NGT curves left in the stomach with the tip in the proximal fundus. Contrast is progressed into the colon and seen as far as the distal descending segment. There is still small-bowel dilatation in the right mid abdomen up to 5 cm. No other dilated small bowel is seen with the prior study having demonstrated small-bowel dilatation throughout the upper to mid abdomen. There is no supine evidence  of free air or other significant findings. In all other respects no further changes. IMPRESSION: 1. Contrast is progressed into the normal caliber colon and seen as far as the distal descending segment. 2. There is still small-bowel dilatation in the right mid abdomen up to 5 cm. No other dilated small bowel is seen with the prior study having demonstrated small-bowel dilatation throughout the upper to mid abdomen. 3. NGT curves to the left in the stomach with the tip in the proximal fundus. Electronically Signed   By: Francis Quam M.D.   On: 01/01/2024 06:52   DG Abd Portable 1V-Small Bowel Obstruction Protocol-initial, 8 hr delay Result Date: 12/31/2023 CLINICAL DATA:  History provided by technologist small-bowel obstruction. 8 hour delay EXAM: PORTABLE ABDOMEN - 1 VIEW COMPARISON:  Radiograph from earlier the same day at 6:54 a.m. FINDINGS: There is increased amount of gas throughout the small bowel and colon, which may represent adynamic ileus versus partial small bowel obstruction. There is no positive contrast seen in the bowel loops. Consider radiographic follow up if clinically indicated. No evidence of pneumoperitoneum, within the limitations of a supine film. No acute osseous abnormalities. The soft tissues are within normal limits. Surgical changes, devices, tubes and lines: None. IMPRESSION: *There is increased amount of gas throughout the small bowel and colon, which may  represent adynamic ileus versus partial small bowel obstruction. There is no positive contrast in the bowel loops. Consider radiographic follow up if clinically indicated. Electronically Signed   By: Ree Molt M.D.   On: 12/31/2023 16:35   DG Abd Portable 1V Result Date: 12/31/2023 CLINICAL DATA:  Small bowel obstruction. EXAM: PORTABLE ABDOMEN - 1 VIEW COMPARISON:  Abdominal radiograph dated 12/30/2023. FINDINGS: Enteric tube with tip in the left upper abdomen likely in the proximal stomach. Progression of small bowel dilatation measuring up to 5.6 cm in diameter. No free air noted. Excreted contrast noted in the urinary bladder. Degenerative changes spine. No acute osseous pathology. IMPRESSION: Progression of small bowel dilatation. Electronically Signed   By: Vanetta Chou M.D.   On: 12/31/2023 11:02       Elgie Butter M.D. [facility] 01/01/2024, 1:36 PM  Available via Epic secure chat 7am-7pm After 7 pm, please refer to night coverage provider listed on amion.

## 2024-01-01 NOTE — Telephone Encounter (Signed)
 Pt family notified office of his admission and asked if he could receive nerve block while admitted. RN called [facility] who reported that they will look into it, NP notified.

## 2024-01-01 NOTE — Progress Notes (Signed)
 Subjective: Patient still has some mild abd tenderness. Endorses flatus, but denies BM.  NGT still with some feculent appearing output. Wife at [facility].   ROS: See above, otherwise other systems negative  Objective: Vital signs in last 24 hours: Temp:  [97.7 F (36.5 C)-99.1 F (37.3 C)] 97.7 F (36.5 C) (07/09 0532) Pulse Rate:  [70-84] 83 (07/09 0532) Resp:  [17-18] 18 (07/09 0349) BP: (122-142)/(62-71) 122/62 (07/09 0532) SpO2:  [91 %-100 %] 96 % (07/09 0532) Last BM Date : 12/29/23  Intake/Output from previous day: 07/08 0701 - 07/09 0700 In: 0  Out: 2500 [Urine:1150; Emesis/NG output:1350] Intake/Output this shift: No intake/output data recorded.  PE: Gen: NAD Lungs: effort nonlabored, still on [employer] Abd: soft with tenderness in central and LLQ today. Less distended today.   Lab Results:  Recent Labs    12/30/23 0500 12/30/23 0538 12/31/23 0440  WBC 8.2  --  5.1  HGB 10.0* 9.2* 9.0*  HCT 30.3* 27.0* 28.1*  PLT 149*  --  124*   BMET Recent Labs    12/30/23 0534 12/30/23 0538 12/31/23 0440  NA 135 136 136  K 4.4 4.2 4.5  CL 100 100 106  CO2 22  --  24  GLUCOSE 153* 139* 131*  BUN 28* 25* 32*  CREATININE 1.32* 1.40* 1.22  CALCIUM 9.4  --  7.8*   PT/INR Recent Labs    12/30/23 0402  LABPROT 12.4  INR 0.9   CMP     Component Value Date/Time   NA 136 12/31/2023 0440   K 4.5 12/31/2023 0440   CL 106 12/31/2023 0440   CO2 24 12/31/2023 0440   GLUCOSE 131 (H) 12/31/2023 0440   BUN 32 (H) 12/31/2023 0440   CREATININE 1.22 12/31/2023 0440   CREATININE 1.08 12/18/2023 1100   CALCIUM 7.8 (L) 12/31/2023 0440   PROT 7.3 12/30/2023 0534   ALBUMIN 3.5 12/30/2023 0534   AST 33 12/30/2023 0534   AST 15 12/18/2023 1100   ALT 18 12/30/2023 0534   ALT 12 12/18/2023 1100   ALKPHOS 62 12/30/2023 0534   BILITOT 1.0 12/30/2023 0534   BILITOT 0.3 12/18/2023 1100   GFRNONAA 59 (L) 12/31/2023 0440   GFRNONAA >60 12/18/2023 1100   GFRAA >60 03/11/2019  2058   Lipase     Component Value Date/Time   LIPASE 25 12/30/2023 0402       Studies/Results: DG Abd Portable 1V Result Date: 01/01/2024 CLINICAL DATA:  Of the if present present EXAM: PORTABLE ABDOMEN - 1 VIEW COMPARISON:  Portable abdomen film yesterday at 4:02 p.m. FINDINGS: 4:15 a.m. NGT curves left in the stomach with the tip in the proximal fundus. Contrast is progressed into the colon and seen as far as the distal descending segment. There is still small-bowel dilatation in the right mid abdomen up to 5 cm. No other dilated small bowel is seen with the prior study having demonstrated small-bowel dilatation throughout the upper to mid abdomen. There is no supine evidence of free air or other significant findings. In all other respects no further changes. IMPRESSION: 1. Contrast is progressed into the normal caliber colon and seen as far as the distal descending segment. 2. There is still small-bowel dilatation in the right mid abdomen up to 5 cm. No other dilated small bowel is seen with the prior study having demonstrated small-bowel dilatation throughout the upper to mid abdomen. 3. NGT curves to the left in the stomach with the tip  in the proximal fundus. Electronically Signed   By: Francis Quam M.D.   On: 01/01/2024 06:52   DG Abd Portable 1V-Small Bowel Obstruction Protocol-initial, 8 hr delay Result Date: 12/31/2023 CLINICAL DATA:  History provided by technologist small-bowel obstruction. 8 hour delay EXAM: PORTABLE ABDOMEN - 1 VIEW COMPARISON:  Radiograph from earlier the same day at 6:54 a.m. FINDINGS: There is increased amount of gas throughout the small bowel and colon, which may represent adynamic ileus versus partial small bowel obstruction. There is no positive contrast seen in the bowel loops. Consider radiographic follow up if clinically indicated. No evidence of pneumoperitoneum, within the limitations of a supine film. No acute osseous abnormalities. The soft tissues are  within normal limits. Surgical changes, devices, tubes and lines: None. IMPRESSION: *There is increased amount of gas throughout the small bowel and colon, which may represent adynamic ileus versus partial small bowel obstruction. There is no positive contrast in the bowel loops. Consider radiographic follow up if clinically indicated. Electronically Signed   By: Ree Molt M.D.   On: 12/31/2023 16:35   DG Abd Portable 1V Result Date: 12/31/2023 CLINICAL DATA:  Small bowel obstruction. EXAM: PORTABLE ABDOMEN - 1 VIEW COMPARISON:  Abdominal radiograph dated 12/30/2023. FINDINGS: Enteric tube with tip in the left upper abdomen likely in the proximal stomach. Progression of small bowel dilatation measuring up to 5.6 cm in diameter. No free air noted. Excreted contrast noted in the urinary bladder. Degenerative changes spine. No acute osseous pathology. IMPRESSION: Progression of small bowel dilatation. Electronically Signed   By: Vanetta Chou M.D.   On: 12/31/2023 11:02   DG Abd Portable 1V Result Date: 12/30/2023 CLINICAL DATA:  Encounter for nasogastric tube placement EXAM: PORTABLE ABDOMEN - 1 VIEW COMPARISON:  None Available. FINDINGS: NG tube with tip in the gastric fundus. Side port below the GE junction. Dilated loop of small bowel in the upper abdomen measures 4 cm IMPRESSION: NG tube in stomach.  Side port below the GE junction. Electronically Signed   By: Jackquline Boxer M.D.   On: 12/30/2023 12:25    Anti-infectives: Anti-infectives (From admission, onward)    None        Assessment/Plan Small bowel obstruction likely secondary to cecal colitis of unclear etiology  -repeat x-ray this am shows contrast has progressed into the normal caliber colon and seen as far as the distal descending segment but there is still small-bowel dilatation in the right mid abdomen up to 5 cm.   -Cont NGT- 1350 mL out overnight.  -WBC still normal as of 7/8 -mobilize as tolerated -still suspect  this area is more related to inflammation and will likely improve with conservative management, but will continue to closely monitor -d/w primary service  FEN - NPO/NGT/IVFs per primary VTE - heparin  ID - none currently  AKI - improved Dysuria - relayed message to primary service Non-small cell lung cancer, mets - on immunotherapy Prostate cancer - hx of radiation HTN  I reviewed nursing notes, hospitalist notes, last 24 h vitals and pain scores, last 48 h intake and output, last 24 h labs and trends, and last 24 h imaging results.   LOS: 2 days    Eulah Hammonds , [facility] 01/01/2024, 8:19 AM Please see Amion for pager number during day hours 7:00am-4:30pm or 7:00am -11:30am on weekends

## 2024-01-01 NOTE — Progress Notes (Signed)
 Chad Moreno 12:43 PM  Subjective: Patient doing okay without any new complaints and case discussed with multiple family members and we answered all of their questions  Objective: Vital signs stable afebrile no acute distress abdomen is a little tender but no rebound soft x-ray improved contrast got through no new labs today  Assessment: Improved small bowel obstruction questionable etiology  Plan: Consider pain management to inject rib as planned as an outpatient prior to discharge and probably lower home narcotic use and appreciate surgical help and await oncology opinion on whether immunotherapy could be playing a role and will check on tomorrow  [facility]  office [phone] After 5PM or if no answer call [phone]

## 2024-01-01 NOTE — Plan of Care (Signed)

## 2024-01-01 NOTE — Telephone Encounter (Signed)
 Spoke to pt's wife, will call and reschedule once pt is discharged and feeling better. AB

## 2024-01-02 ENCOUNTER — Ambulatory Visit (HOSPITAL_COMMUNITY)
Admission: RE | Admit: 2024-01-02 | Discharge: 2024-01-02 | Disposition: A | Discharge status: Home / Self Care | Source: Ambulatory Visit

## 2024-01-02 ENCOUNTER — Other Ambulatory Visit: Payer: Self-pay | Admitting: Physician Assistant

## 2024-01-02 DIAGNOSIS — C7951 Secondary malignant neoplasm of bone: Secondary | ICD-10-CM | POA: Diagnosis not present

## 2024-01-02 DIAGNOSIS — K56609 Unspecified intestinal obstruction, unspecified as to partial versus complete obstruction: Secondary | ICD-10-CM | POA: Diagnosis not present

## 2024-01-02 DIAGNOSIS — C3492 Malignant neoplasm of unspecified part of left bronchus or lung: Secondary | ICD-10-CM | POA: Diagnosis not present

## 2024-01-02 LAB — BASIC METABOLIC PANEL WITH GFR
Anion gap: 8 (ref 5–15)
BUN: 15 mg/dL (ref 8–23)
CO2: 25 mmol/L (ref 22–32)
Calcium: 7.8 mg/dL — ABNORMAL LOW (ref 8.9–10.3)
Chloride: 105 mmol/L (ref 98–111)
Creatinine, Ser: 0.8 mg/dL (ref 0.61–1.24)
GFR, Estimated: >60 mL/min (ref >60)
Glucose, Bld: 99 mg/dL (ref 70–99)
Potassium: 4.1 mmol/L (ref 3.5–5.1)
Sodium: 138 mmol/L (ref 135–145)

## 2024-01-02 MED ORDER — LACTATED RINGERS IV BOLUS
1000.0000 mL | Freq: Once | INTRAVENOUS | Status: AC
  Administered 2024-01-02: 1000 mL via INTRAVENOUS

## 2024-01-02 MED ORDER — LACTATED RINGERS IV SOLN: INTRAVENOUS | Status: AC

## 2024-01-02 MED ORDER — GERHARDT'S BUTT CREAM
TOPICAL_CREAM | Freq: Four times a day (QID) | CUTANEOUS | Status: DC
  Administered 2024-01-03 – 2024-01-07 (×5): 1 via TOPICAL
  Filled 2024-01-02 (×2): qty 60

## 2024-01-02 NOTE — Plan of Care (Signed)
  Problem: Clinical Measurements: Goal: Will remain free from infection Outcome: Progressing   Problem: Activity: Goal: Risk for activity intolerance will decrease Outcome: Progressing   Problem: Pain Managment: Goal: General experience of comfort will improve and/or be controlled Outcome: Progressing   Problem: Safety: Goal: Ability to remain free from injury will improve Outcome: Progressing

## 2024-01-02 NOTE — Progress Notes (Signed)
 Chad Moreno 10:35 AM  Subjective: Patient doing okay did have a bowel movement is beginning to walk some no new complaints and we answered all of the wife's questions and she is waiting to hear back from the oncology team unfortunately pain management wants to wait for him to be an outpatient and he seems to be tolerating ginger ale so far  Objective: Vital signs stable afebrile no acute distress exam about the same soft with periodic discomfort no rebound no new labs no new x-ray  Assessment: Seemingly resolved small bowel obstruction  Plan: See how he does with the clamped NG tube and we discussed MiraLAX use at home as well as trying to decrease pain medicine and we await oncology's opinion will check on tomorrow and encouraged him to walk a little more  [facility]  office [phone] After 5PM or if no answer call [phone]

## 2024-01-02 NOTE — Progress Notes (Signed)
 [facility]                                                                               Chad Moreno, is a 83 y.o. male, DOB - 14-Apr-1941, FMW:[id number] Admit date - 12/30/2023    Outpatient Primary MD for the patient is [name], [name], MD  LOS - 3  days    Brief summary   Chad Moreno is a 83 year old man medical history of metastatic small cell lung cancer s/p SRB [place] completed 10/2022 and currently onencorafenib and binimetinib  started on 06/2023, chronic thrombocytopenia, essential hypertension and GERD.  Presented and complaining of diffuse abdominal pain for 1 day, abdominal discomfort, distention, nausea and vomiting x 2.  Also reported several piece of dark and loose stool.  Concern for cecal colitis and bowel obstruction.   X-ray abdomen show gaseous distention of the small bowel measuring up to 4.2 cm compatible with small bowel obstruction.Lucency under the left hemidiaphragm is probably related to gaseous distention of the stomach . Free gas under the left hemidiaphragm is considered less likely but not excluded. This could also be further assessed at the time of CT.  CTA: CT abd/ Pelvis :  no PE, New wall thickening and inflammatory fat stranding about the cecum, consistent with nonspecific infectious or inflammatory colitis. 3. Mildly distended, fluid-filled loops of mid to distal small bowel throughout the abdomen, largest, distal loops measuring up to 4.1 cm in caliber. Diminished volume of a left pleural effusion   Unchanged sclerotic osseous metastases of the posterior right ninth rib and T9 vertebral body. Unchanged sclerotic osseous metastases of the left sacrum and ilium.  Hospitalist has been consulted for further evaluation management of small bowel obstruction, AKI and GI bleed. General surgery on board.    Assessment & Plan    Assessment and Plan:  Bowel obstruction suspect from cecal colitis of unclear etiology.  S/p NGT,  overnight, patient had a large BM, passing flatus. NGT is clamped today and he was started on clears.  Gen surgery on board and appreciate recommendations.  Continue with conservative management  GI on board.    Cecal colitis  Unclear etiology, discussed with Dr [name],. Its possible that colitis is from immunotherapy. Patient will follow up with Dr [name] on discharge.  Patient reports some abdominal discomfort , some nausea when started on clears. But overall he appears to be improving.   Non-small cell lung cancer  Follows up with Dr [name].  on immunotherapy which is on hold.  Hypertension Better controlled.    Acute kidney injury in the setting of vomiting and dehydration Continue with IV fluids Creatinine has improved and appears to be back at baseline   Heme positive stools.  Drop in hemoglobin from 10.7 to 9.  Continue to monitor.     Iron deficiency anemia/mild thrombocytopenia Continue to monitor and iron supplementation will be added on discharge.  Dizziness on standing Check orthostatic vital signs.  Therapy evaluations ordered.    Estimated body mass index is 28.48 kg/m as calculated from the following:   Height as of this encounter: 6' (1.829 m).   Weight as of this encounter: 95.3 kg.  Code Status: full code.  DVT Prophylaxis:  heparin  injection 5,000 Units Start: 01/01/24 0600 Place and maintain sequential compression device Start: 12/31/23 0733   Level of Care: Level of care: Med-Surg Family Communication: family at bedside.   Disposition Plan:     Remains inpatient appropriate:  pending clinical improvement.   Procedures:  None.   Consultants:   Gen surgery GI  Antimicrobials:   Anti-infectives (From admission, onward)    None        Medications  Scheduled Meds:  Gerhardt's butt cream   Topical QID   heparin   5,000 Units Subcutaneous Q8H   Continuous Infusions:  lactated ringers      lactated ringers  75 mL/hr at 01/02/24  0812   promethazine (PHENERGAN) injection (IM or IVPB)     PRN Meds:.acetaminophen  **OR** acetaminophen , albuterol , hydrALAZINE , HYDROmorphone  (DILAUDID ) injection, LORazepam , ondansetron  **OR** ondansetron  (ZOFRAN ) IV, phenol, promethazine (PHENERGAN) injection (IM or IVPB)    Subjective:   Chad Moreno was seen and examined today. Slightly nauseated,   Objective:   Vitals:   01/01/24 1345 01/01/24 2144 01/02/24 0536 01/02/24 1337  BP: (!) 124/56 (!) 133/56 (!) 115/55 (!) 140/73  Pulse: 82 82 76 84  Resp: 16 17 16 18   Temp: 98.1 F (36.7 C) 98.2 F (36.8 C) 98.3 F (36.8 C) 97.9 F (36.6 C)  TempSrc: Oral Oral Oral Oral  SpO2: 98% 98% 96% 100%  Weight:      Height:        Intake/Output Summary (Last 24 hours) at 01/02/2024 1602 Last data filed at 01/02/2024 1457 Gross per 24 hour  Intake 2886.58 ml  Output 1375 ml  Net 1511.58 ml   Filed Weights   12/30/23 0315  Weight: 95.3 kg     Exam General exam: Appears calm and comfortable  Respiratory system: Clear to auscultation. Respiratory effort normal. Cardiovascular system: S1 & S2 heard, RRR. No JVD,  Gastrointestinal system: Abdomen is nondistended, soft and nontender.  Central nervous system: Alert and oriented.  Extremities: Symmetric 5 x 5 power. Skin: No rashes, Psychiatry:  Mood & affect appropriate.     Data Reviewed:  I have personally reviewed following labs and imaging studies   CBC Lab Results  Component Value Date   WBC 5.1 12/31/2023   RBC 3.16 (L) 12/31/2023   HGB 9.0 (L) 12/31/2023   HCT 28.1 (L) 12/31/2023   MCV 88.9 12/31/2023   MCH 28.5 12/31/2023   PLT 124 (L) 12/31/2023   MCHC 32.0 12/31/2023   RDW 14.9 12/31/2023   LYMPHSABS 0.3 (L) 12/30/2023   MONOABS 0.6 12/30/2023   EOSABS 0.0 12/30/2023   BASOSABS 0.0 12/30/2023     Last metabolic panel Lab Results  Component Value Date   NA 138 01/02/2024   K 4.1 01/02/2024   CL 105 01/02/2024   CO2 25 01/02/2024   BUN 15  01/02/2024   CREATININE 0.80 01/02/2024   GLUCOSE 99 01/02/2024   GFRNONAA >60 01/02/2024   GFRAA >60 03/11/2019   CALCIUM 7.8 (L) 01/02/2024   PROT 7.3 12/30/2023   ALBUMIN 3.5 12/30/2023   BILITOT 1.0 12/30/2023   ALKPHOS 62 12/30/2023   AST 33 12/30/2023   ALT 18 12/30/2023   ANIONGAP 8 01/02/2024    CBG (last 3)  No results for input(s): GLUCAP in the last 72 hours.    Coagulation Profile: Recent Labs  Lab 12/30/23 0402  INR 0.9     Radiology Studies: DG Abd Portable 1V Result Date: 01/01/2024 CLINICAL DATA:  Of the if present present EXAM: PORTABLE ABDOMEN - 1 VIEW COMPARISON:  Portable abdomen film yesterday at 4:02 p.m. FINDINGS: 4:15 a.m. NGT curves left in the stomach with the tip in the proximal fundus. Contrast is progressed into the colon and seen as far as the distal descending segment. There is still small-bowel dilatation in the right mid abdomen up to 5 cm. No other dilated small bowel is seen with the prior study having demonstrated small-bowel dilatation throughout the upper to mid abdomen. There is no supine evidence of free air or other significant findings. In all other respects no further changes. IMPRESSION: 1. Contrast is progressed into the normal caliber colon and seen as far as the distal descending segment. 2. There is still small-bowel dilatation in the right mid abdomen up to 5 cm. No other dilated small bowel is seen with the prior study having demonstrated small-bowel dilatation throughout the upper to mid abdomen. 3. NGT curves to the left in the stomach with the tip in the proximal fundus. Electronically Signed   By: Francis Quam M.D.   On: 01/01/2024 06:52   DG Abd Portable 1V-Small Bowel Obstruction Protocol-initial, 8 hr delay Result Date: 12/31/2023 CLINICAL DATA:  History provided by technologist small-bowel obstruction. 8 hour delay EXAM: PORTABLE ABDOMEN - 1 VIEW COMPARISON:  Radiograph from earlier the same day at 6:54 a.m. FINDINGS: There  is increased amount of gas throughout the small bowel and colon, which may represent adynamic ileus versus partial small bowel obstruction. There is no positive contrast seen in the bowel loops. Consider radiographic follow up if clinically indicated. No evidence of pneumoperitoneum, within the limitations of a supine film. No acute osseous abnormalities. The soft tissues are within normal limits. Surgical changes, devices, tubes and lines: None. IMPRESSION: *There is increased amount of gas throughout the small bowel and colon, which may represent adynamic ileus versus partial small bowel obstruction. There is no positive contrast in the bowel loops. Consider radiographic follow up if clinically indicated. Electronically Signed   By: Ree Molt M.D.   On: 12/31/2023 16:35       Elgie Butter M.D. [facility] 01/02/2024, 4:02 PM  Available via Epic secure chat 7am-7pm After 7 pm, please refer to night coverage provider listed on amion.

## 2024-01-02 NOTE — Progress Notes (Signed)
 Subjective: Patient still with suprapubic and RLQ tenderness. Denies flatus or BM today. Had large BM yesterday and flatus. NGT still with some feculent appearing output. Has some complaints of dizziness last night that has since resolved.   ROS: See above, otherwise other systems negative  Objective: Vital signs in last 24 hours: Temp:  [98.1 F (36.7 C)-98.3 F (36.8 C)] 98.3 F (36.8 C) (07/10 0536) Pulse Rate:  [76-82] 76 (07/10 0536) Resp:  [16-17] 16 (07/10 0536) BP: (115-133)/(55-56) 115/55 (07/10 0536) SpO2:  [96 %-98 %] 96 % (07/10 0536) Last BM Date : 01/01/24  Intake/Output from previous day: 07/09 0701 - 07/10 0700 In: 2360 [P.O.:180; I.V.:2000; NG/GT:180] Out: 1775 [Urine:725; Emesis/NG output:1050] Intake/Output this shift: No intake/output data recorded.  PE: Gen: NAD Lungs: effort nonlabored, still on [place] Abd: soft with tenderness in suprapubic region and RLQ today. Less distended overall.   Lab Results:  Recent Labs    12/31/23 0440  WBC 5.1  HGB 9.0*  HCT 28.1*  PLT 124*   BMET Recent Labs    12/31/23 0440  NA 136  K 4.5  CL 106  CO2 24  GLUCOSE 131*  BUN 32*  CREATININE 1.22  CALCIUM 7.8*   PT/INR No results for input(s): LABPROT, INR in the last 72 hours.  CMP     Component Value Date/Time   NA 136 12/31/2023 0440   K 4.5 12/31/2023 0440   CL 106 12/31/2023 0440   CO2 24 12/31/2023 0440   GLUCOSE 131 (H) 12/31/2023 0440   BUN 32 (H) 12/31/2023 0440   CREATININE 1.22 12/31/2023 0440   CREATININE 1.08 12/18/2023 1100   CALCIUM 7.8 (L) 12/31/2023 0440   PROT 7.3 12/30/2023 0534   ALBUMIN 3.5 12/30/2023 0534   AST 33 12/30/2023 0534   AST 15 12/18/2023 1100   ALT 18 12/30/2023 0534   ALT 12 12/18/2023 1100   ALKPHOS 62 12/30/2023 0534   BILITOT 1.0 12/30/2023 0534   BILITOT 0.3 12/18/2023 1100   GFRNONAA 59 (L) 12/31/2023 0440   GFRNONAA >60 12/18/2023 1100   GFRAA >60 03/11/2019 2058   Lipase     Component  Value Date/Time   LIPASE 25 12/30/2023 0402       Studies/Results: DG Abd Portable 1V Result Date: 01/01/2024 CLINICAL DATA:  Of the if present present EXAM: PORTABLE ABDOMEN - 1 VIEW COMPARISON:  Portable abdomen film yesterday at 4:02 p.m. FINDINGS: 4:15 a.m. NGT curves left in the stomach with the tip in the proximal fundus. Contrast is progressed into the colon and seen as far as the distal descending segment. There is still small-bowel dilatation in the right mid abdomen up to 5 cm. No other dilated small bowel is seen with the prior study having demonstrated small-bowel dilatation throughout the upper to mid abdomen. There is no supine evidence of free air or other significant findings. In all other respects no further changes. IMPRESSION: 1. Contrast is progressed into the normal caliber colon and seen as far as the distal descending segment. 2. There is still small-bowel dilatation in the right mid abdomen up to 5 cm. No other dilated small bowel is seen with the prior study having demonstrated small-bowel dilatation throughout the upper to mid abdomen. 3. NGT curves to the left in the stomach with the tip in the proximal fundus. Electronically Signed   By: Francis Quam M.D.   On: 01/01/2024 06:52   DG Abd Portable 1V-Small Bowel Obstruction Protocol-initial,  8 hr delay Result Date: 12/31/2023 CLINICAL DATA:  History provided by technologist small-bowel obstruction. 8 hour delay EXAM: PORTABLE ABDOMEN - 1 VIEW COMPARISON:  Radiograph from earlier the same day at 6:54 a.m. FINDINGS: There is increased amount of gas throughout the small bowel and colon, which may represent adynamic ileus versus partial small bowel obstruction. There is no positive contrast seen in the bowel loops. Consider radiographic follow up if clinically indicated. No evidence of pneumoperitoneum, within the limitations of a supine film. No acute osseous abnormalities. The soft tissues are within normal limits. Surgical  changes, devices, tubes and lines: None. IMPRESSION: *There is increased amount of gas throughout the small bowel and colon, which may represent adynamic ileus versus partial small bowel obstruction. There is no positive contrast in the bowel loops. Consider radiographic follow up if clinically indicated. Electronically Signed   By: Ree Molt M.D.   On: 12/31/2023 16:35    Anti-infectives: Anti-infectives (From admission, onward)    None        Assessment/Plan Small bowel obstruction likely secondary to cecal colitis of unclear etiology  -repeat x-ray yesterday shows contrast has progressed into the normal caliber colon and seen as far as the distal descending segment but there is still small-bowel dilatation in the right mid abdomen up to 5 cm.   -Cont NGT- 1050 mL out overnight.  -WBC normal as of 7/8 -mobilize as tolerated -I still suspect this area is more related to inflammation and will likely improve with conservative management, but will continue to closely monitor  FEN - NGT to suction/IVFs, plan to clamp NGT and try CLD.   VTE - heparin  ID - none currently  AKI - improving  Dysuria - relayed message to primary service Non-small cell lung cancer, mets - on immunotherapy Prostate cancer - hx of radiation HTN  I reviewed nursing notes, hospitalist notes, last 24 h vitals and pain scores, last 48 h intake and output, last 24 h labs and trends, and last 24 h imaging results.   LOS: 3 days    Eulah Hammonds , [facility] 01/02/2024, 7:32 AM Please see Amion for pager number during day hours 7:00am-4:30pm or 7:00am -11:30am on weekends

## 2024-01-03 ENCOUNTER — Inpatient Hospital Stay (HOSPITAL_COMMUNITY)

## 2024-01-03 DIAGNOSIS — C7951 Secondary malignant neoplasm of bone: Secondary | ICD-10-CM | POA: Diagnosis not present

## 2024-01-03 DIAGNOSIS — C3492 Malignant neoplasm of unspecified part of left bronchus or lung: Secondary | ICD-10-CM | POA: Diagnosis not present

## 2024-01-03 DIAGNOSIS — K56609 Unspecified intestinal obstruction, unspecified as to partial versus complete obstruction: Secondary | ICD-10-CM | POA: Diagnosis not present

## 2024-01-03 LAB — URINALYSIS, ROUTINE W REFLEX MICROSCOPIC
Bilirubin Urine: NEGATIVE
Glucose, UA: NEGATIVE mg/dL
Ketones, ur: 20 mg/dL — AB
Nitrite: POSITIVE — AB
Protein, ur: 30 mg/dL — AB
Specific Gravity, Urine: 1.038 — ABNORMAL HIGH (ref 1.005–1.030)
pH: 5 (ref 5.0–8.0)

## 2024-01-03 LAB — CBC WITH DIFFERENTIAL/PLATELET
Abs Immature Granulocytes: 0.08 K/uL — ABNORMAL HIGH (ref 0.00–0.07)
Basophils Absolute: 0 K/uL (ref 0.0–0.1)
Basophils Relative: 0 %
Eosinophils Absolute: 0.1 K/uL (ref 0.0–0.5)
Eosinophils Relative: 3 %
HCT: 25.2 % — ABNORMAL LOW (ref 39.0–52.0)
Hemoglobin: 8 g/dL — ABNORMAL LOW (ref 13.0–17.0)
Immature Granulocytes: 2 %
Lymphocytes Relative: 8 %
Lymphs Abs: 0.3 K/uL — ABNORMAL LOW (ref 0.7–4.0)
MCH: 28.5 pg (ref 26.0–34.0)
MCHC: 31.7 g/dL (ref 30.0–36.0)
MCV: 89.7 fL (ref 80.0–100.0)
Monocytes Absolute: 0.5 K/uL (ref 0.1–1.0)
Monocytes Relative: 13 %
Neutro Abs: 2.9 K/uL (ref 1.7–7.7)
Neutrophils Relative %: 74 %
Platelets: 113 K/uL — ABNORMAL LOW (ref 150–400)
RBC: 2.81 MIL/uL — ABNORMAL LOW (ref 4.22–5.81)
RDW: 14.1 % (ref 11.5–15.5)
WBC: 3.9 K/uL — ABNORMAL LOW (ref 4.0–10.5)
nRBC: 0 % (ref 0.0–0.2)

## 2024-01-03 LAB — BASIC METABOLIC PANEL WITH GFR
Anion gap: 7 (ref 5–15)
BUN: 12 mg/dL (ref 8–23)
CO2: 27 mmol/L (ref 22–32)
Calcium: 7.7 mg/dL — ABNORMAL LOW (ref 8.9–10.3)
Chloride: 104 mmol/L (ref 98–111)
Creatinine, Ser: 0.77 mg/dL (ref 0.61–1.24)
GFR, Estimated: >60 mL/min (ref >60)
Glucose, Bld: 105 mg/dL — ABNORMAL HIGH (ref 70–99)
Potassium: 3.6 mmol/L (ref 3.5–5.1)
Sodium: 138 mmol/L (ref 135–145)

## 2024-01-03 MED ORDER — IOHEXOL 300 MG/ML  SOLN
100.0000 mL | Freq: Once | INTRAMUSCULAR | Status: AC | PRN
  Administered 2024-01-03: 100 mL via INTRAVENOUS

## 2024-01-03 MED ORDER — LACTATED RINGERS IV SOLN: INTRAVENOUS | Status: AC

## 2024-01-03 MED ORDER — METHOCARBAMOL 1000 MG/10ML IJ SOLN
500.0000 mg | Freq: Four times a day (QID) | INTRAMUSCULAR | Status: DC | PRN
  Administered 2024-01-03 – 2024-01-04 (×3): 500 mg via INTRAVENOUS
  Filled 2024-01-03 (×3): qty 10

## 2024-01-03 MED ORDER — IOHEXOL 9 MG/ML PO SOLN
ORAL | Status: AC
  Filled 2024-01-03: qty 1000

## 2024-01-03 MED ORDER — HYDROMORPHONE HCL 1 MG/ML IJ SOLN
1.0000 mg | INTRAMUSCULAR | Status: DC | PRN
  Administered 2024-01-03 – 2024-01-06 (×7): 1 mg via INTRAVENOUS
  Filled 2024-01-03 (×7): qty 1

## 2024-01-03 MED ORDER — LACTATED RINGERS IV BOLUS
1000.0000 mL | Freq: Once | INTRAVENOUS | Status: AC
  Administered 2024-01-03: 1000 mL via INTRAVENOUS

## 2024-01-03 MED ORDER — KETOROLAC TROMETHAMINE 15 MG/ML IJ SOLN
15.0000 mg | Freq: Once | INTRAMUSCULAR | Status: AC
  Administered 2024-01-03: 15 mg via INTRAVENOUS
  Filled 2024-01-03: qty 1

## 2024-01-03 MED ORDER — IOHEXOL 9 MG/ML PO SOLN
500.0000 mL | ORAL | Status: AC
  Administered 2024-01-03 (×2): 500 mL via ORAL

## 2024-01-03 NOTE — Progress Notes (Addendum)
 Subjective: Patient with increased suprapubic and RLQ tenderness. Endorses flatus and BM this AM. NGT remains clamped. Has some complaints of dizziness upon standing yesterday but seems to have improved. Endorses nausea without emesis.   ROS: See above, otherwise other systems negative  Objective: Vital signs in last 24 hours: Temp:  [97.9 F (36.6 C)-98.7 F (37.1 C)] 98.5 F (36.9 C) (07/11 0520) Pulse Rate:  [73-84] 73 (07/11 0520) Resp:  [18-19] 19 (07/11 0520) BP: (123-159)/(61-73) 159/68 (07/11 0520) SpO2:  [95 %-100 %] 95 % (07/11 0520) Last BM Date : 01/01/24  Intake/Output from previous day: 07/10 0701 - 07/11 0700 In: 1914.9 [P.O.:540; I.V.:733.2; IV Piggyback:641.7] Out: 0  Intake/Output this shift: No intake/output data recorded.  PE: Gen: NAD Lungs: effort nonlabored, still on [place] Abd: soft with increased tenderness in suprapubic region and RLQ today. Overall less distended.   Lab Results:  Recent Labs    01/03/24 0425  WBC 3.9*  HGB 8.0*  HCT 25.2*  PLT 113*   BMET Recent Labs    01/02/24 0850 01/03/24 0425  NA 138 138  K 4.1 3.6  CL 105 104  CO2 25 27  GLUCOSE 99 105*  BUN 15 12  CREATININE 0.80 0.77  CALCIUM 7.8* 7.7*   PT/INR No results for input(s): LABPROT, INR in the last 72 hours.  CMP     Component Value Date/Time   NA 138 01/03/2024 0425   K 3.6 01/03/2024 0425   CL 104 01/03/2024 0425   CO2 27 01/03/2024 0425   GLUCOSE 105 (H) 01/03/2024 0425   BUN 12 01/03/2024 0425   CREATININE 0.77 01/03/2024 0425   CREATININE 1.08 12/18/2023 1100   CALCIUM 7.7 (L) 01/03/2024 0425   PROT 7.3 12/30/2023 0534   ALBUMIN 3.5 12/30/2023 0534   AST 33 12/30/2023 0534   AST 15 12/18/2023 1100   ALT 18 12/30/2023 0534   ALT 12 12/18/2023 1100   ALKPHOS 62 12/30/2023 0534   BILITOT 1.0 12/30/2023 0534   BILITOT 0.3 12/18/2023 1100   GFRNONAA >60 01/03/2024 0425   GFRNONAA >60 12/18/2023 1100   GFRAA >60 03/11/2019 2058    Lipase     Component Value Date/Time   LIPASE 25 12/30/2023 0402     Studies/Results: No results found.   Anti-infectives: Anti-infectives (From admission, onward)    None        Assessment/Plan Small bowel obstruction likely secondary to cecal colitis of unclear etiology  -repeat x-ray 7/9 shows contrast has progressed into the normal caliber colon and seen as far as the distal descending segment but there is still small-bowel dilatation in the right mid abdomen up to 5 cm.   -NGT clamped -WBC 3.9 -mobilize as tolerated -I still suspect this area is more related to inflammation and will likely improve with conservative management, but will continue to closely monitor  FEN - Continue clamped NGT for now, IVFs, CLD. Will order plain film x-ray due to increased abd discomfort.  VTE - heparin  subQ ID - none currently  AKI - improving  Dysuria - relayed message to primary service Non-small cell lung cancer, mets - on immunotherapy Prostate cancer - hx of radiation HTN  I reviewed nursing notes, hospitalist notes, last 24 h vitals and pain scores, last 48 h intake and output, last 24 h labs and trends, and last 24 h imaging results.   LOS: 4 days    Eulah Hammonds , [facility] 01/03/2024,  7:26 AM Please see Amion for pager number during day hours 7:00am-4:30pm or 7:00am -11:30am on weekends

## 2024-01-03 NOTE — Progress Notes (Signed)
 OT Cancellation Note  Patient Details Name: Chad Moreno MRN: [id number] DOB: September 21, 1940   Cancelled Treatment:    Reason Eval/Treat Not Completed: Patient at procedure or test/ unavailable  OT attempted initial evaluation multiple times this day with MD visits in am and now en-route to CT. OT will continue to follow acutely for eval as soon as schedule allows.   Dillinger Aston OT/L Acute Rehabilitation Department  [phone]    01/03/2024, 2:35 PM

## 2024-01-03 NOTE — Plan of Care (Signed)
   Problem: Education: Goal: Knowledge of General Education information will improve Description Including pain rating scale, medication(s)/side effects and non-pharmacologic comfort measures Outcome: Progressing

## 2024-01-03 NOTE — Progress Notes (Addendum)
 PT Cancellation Note  Patient Details Name: Chad Moreno MRN: [id number] DOB: 01-20-1941   Cancelled Treatment:    Reason Eval/Treat Not Completed: Patient declined, no reason specified Pt remains nauseated.  NG tube reconnected to suction per nursing.  Pt would like therapy to check back later this afternoon.  1430: upon checking back, pt going for imaging.  Kati L Payson 01/03/2024, 12:03 PM Tari KLEIN, DPT Physical Therapist Acute Rehabilitation Services Office: [phone]

## 2024-01-03 NOTE — Progress Notes (Signed)
 Chad Moreno 10:55 AM  Subjective: Patient doing about the same to me and again Case discussed with his wife and he does have some orthostatic hypotension when he gets up and he is felt too weak to walk and did have some increased cramps that come and go and is moving his bowels and has no new complaints  Objective: Vital signs stable afebrile no acute distress to me his exam is unchanged soft no rebound mostly tender on the right electrolytes okay white count okay hemoglobin slight drop x-ray some increased gas throughout by my reading radiology official result pending  Assessment: Probably colonic inflammation possibly from immunotherapy  Plan: Await CT scan as ordered by surgery with further workup and plans pending those findings  [facility]  office [phone] After 5PM or if no answer call [phone]

## 2024-01-03 NOTE — Progress Notes (Signed)
 [facility]                                                                               Chad Moreno, is a 83 y.o. male, DOB - 02-Mar-1941, FMW:[id number] Admit date - 12/30/2023    Outpatient Primary MD for the patient is [name], [name], MD  LOS - 4  days    Brief summary   Chad Moreno is a 83 year old man medical history of metastatic small cell lung cancer s/p SRB Chad Moreno completed 10/2022 and currently onencorafenib and binimetinib  started on 06/2023, chronic thrombocytopenia, essential hypertension and GERD.  Presented and complaining of diffuse abdominal pain for 1 day, abdominal discomfort, distention, nausea and vomiting x 2.  Also reported several piece of dark and loose stool.  Concern for cecal colitis and bowel obstruction.   X-ray abdomen show gaseous distention of the small bowel measuring up to 4.2 cm compatible with small bowel obstruction.Lucency under the left hemidiaphragm is probably related to gaseous distention of the stomach . Free gas under the left hemidiaphragm is considered less likely but not excluded. This could also be further assessed at the time of CT.  CTA: CT abd/ Pelvis :  no PE, New wall thickening and inflammatory fat stranding about the cecum, consistent with nonspecific infectious or inflammatory colitis. 3. Mildly distended, fluid-filled loops of mid to distal small bowel throughout the abdomen, largest, distal loops measuring up to 4.1 cm in caliber. Diminished volume of a left pleural effusion   Unchanged sclerotic osseous metastases of the posterior right ninth rib and T9 vertebral body. Unchanged sclerotic osseous metastases of the left sacrum and ilium.  Hospitalist has been consulted for further evaluation management of small bowel obstruction, AKI and GI bleed. General surgery on board.    Assessment & Plan    Assessment and Plan:  Bowel obstruction suspect from cecal colitis of unclear etiology.  S/p NGT, clamped ,  pt had a BM in the last 24 hours,. Pt reports nausea and abdominal pain today.  Gen surgery on board and appreciate recommendations. A CT abd and pelvis was ordered for further evaluation.  Continue with conservative management  GI on board.    Cecal colitis  Unclear etiology, discussed with Dr Chad Moreno,. Its possible that colitis is from immunotherapy. Patient will follow up with Dr Chad Moreno on discharge.  Patient reports some abdominal discomfort , some nausea when started on clears.  Wbc count is 3.9. he remains afebrile.   Non-small cell lung cancer  Follows up with Dr Chad Moreno.  on immunotherapy which is on hold.  Hypertension Optimal. No changes in meds.    Acute kidney injury in the setting of vomiting and dehydration Continue with IV fluids Creatinine has improved and appears to be back at baseline   Heme positive stools.  Drop in hemoglobin from 10.7 to 9.  Continue to monitor.     Iron  deficiency anemia/mild thrombocytopenia Continue to monitor and iron  supplementation will be added on discharge.  Orthostatic hypotension Recheck orthostatic vital signs today.    Estimated body mass index is 28.48 kg/m as calculated from the following:   Height as of this encounter: 6' (1.829 m).  Weight as of this encounter: 95.3 kg.  Code Status: full code.  DVT Prophylaxis:  heparin  injection 5,000 Units Start: 01/01/24 0600 Place and maintain sequential compression device Start: 12/31/23 0733   Level of Care: Level of care: Med-Surg Family Communication: family at bedside.   Disposition Plan:     Remains inpatient appropriate:  pending clinical improvement.   Procedures:  None.   Consultants:   Gen surgery GI  Antimicrobials:   Anti-infectives (From admission, onward)    None        Medications  Scheduled Meds:  Chad Moreno butt cream   Topical QID   heparin   5,000 Units Subcutaneous Q8H   Continuous Infusions:  lactated ringers      lactated  ringers      lactated ringers      promethazine (PHENERGAN) injection (IM or IVPB)     PRN Meds:.acetaminophen  **OR** acetaminophen , albuterol , hydrALAZINE , HYDROmorphone  (DILAUDID ) injection, LORazepam , methocarbamol  (ROBAXIN ) injection, ondansetron  **OR** ondansetron  (ZOFRAN ) IV, phenol, promethazine (PHENERGAN) injection (IM or IVPB)    Subjective:   Chad Moreno was seen and examined today. Slightly nauseated,   Objective:   Vitals:   01/02/24 1337 01/02/24 2123 01/03/24 0520 01/03/24 1341  BP: (!) 140/73 123/61 (!) 159/68 114/65  Pulse: 84 73 73 71  Resp: 18 18 19 17   Temp: 97.9 F (36.6 C) 98.7 F (37.1 C) 98.5 F (36.9 C) 98.6 F (37 C)  TempSrc: Oral Oral Oral Oral  SpO2: 100% 100% 95% 98%  Weight:      Height:        Intake/Output Summary (Last 24 hours) at 01/03/2024 1404 Last data filed at 01/03/2024 1228 Gross per 24 hour  Intake 1508.31 ml  Output 900 ml  Net 608.31 ml   Filed Weights   12/30/23 0315  Weight: 95.3 kg     Exam General exam: Appears calm and comfortable  Respiratory system: Clear to auscultation. Respiratory effort normal. Cardiovascular system: S1 & S2 heard, RRR. No JVD, Gastrointestinal system: Abdomen is nondistended, soft and nontender.s/p NG tube. Central nervous system: Alert and oriented.  Extremities: Symmetric 5 x 5 power. Skin: No rashes,  Psychiatry: Mood & affect appropriate.      Data Reviewed:  I have personally reviewed following labs and imaging studies   CBC Lab Results  Component Value Date   WBC 3.9 (L) 01/03/2024   RBC 2.81 (L) 01/03/2024   HGB 8.0 (L) 01/03/2024   HCT 25.2 (L) 01/03/2024   MCV 89.7 01/03/2024   MCH 28.5 01/03/2024   PLT 113 (L) 01/03/2024   MCHC 31.7 01/03/2024   RDW 14.1 01/03/2024   LYMPHSABS 0.3 (L) 01/03/2024   MONOABS 0.5 01/03/2024   EOSABS 0.1 01/03/2024   BASOSABS 0.0 01/03/2024     Last metabolic panel Lab Results  Component Value Date   NA 138 01/03/2024   K 3.6  01/03/2024   CL 104 01/03/2024   CO2 27 01/03/2024   BUN 12 01/03/2024   CREATININE 0.77 01/03/2024   GLUCOSE 105 (H) 01/03/2024   GFRNONAA >60 01/03/2024   GFRAA >60 03/11/2019   CALCIUM 7.7 (L) 01/03/2024   PROT 7.3 12/30/2023   ALBUMIN 3.5 12/30/2023   BILITOT 1.0 12/30/2023   ALKPHOS 62 12/30/2023   AST 33 12/30/2023   ALT 18 12/30/2023   ANIONGAP 7 01/03/2024    CBG (last 3)  No results for input(s): GLUCAP in the last 72 hours.    Coagulation Profile: Recent Labs  Lab 12/30/23 0402  INR 0.9  Radiology Studies: DG Abd Portable 1V Result Date: 01/03/2024 CLINICAL DATA:  Abdominal bloating cramping EXAM: PORTABLE ABDOMEN - 1 VIEW COMPARISON:  Abdominal radiograph dated 01/01/2024 FINDINGS: Gastric/enteric tube tip projects over the stomach. Bowel gas is seen to the level rectum. Diffuse gas-filled dilation of the colon. No free air or pneumatosis. No abnormal radio-opaque calculi or mass effect. No acute or substantial osseous abnormality. The sacrum and coccyx are partially obscured by overlying bowel contents. Prostate fiducials project over the midline lower pelvis. IMPRESSION: 1. Diffuse gas-filled dilation of the colon, which may represent ileus. 2. Gastric/enteric tube tip projects over the stomach. Electronically Signed   By: Limin  Xu M.D.   On: 01/03/2024 12:03       Elgie Butter M.D. [facility] 01/03/2024, 2:04 PM  Available via Epic secure chat 7am-7pm After 7 pm, please refer to night coverage provider listed on amion.

## 2024-01-04 ENCOUNTER — Inpatient Hospital Stay (HOSPITAL_COMMUNITY)

## 2024-01-04 DIAGNOSIS — K56609 Unspecified intestinal obstruction, unspecified as to partial versus complete obstruction: Secondary | ICD-10-CM | POA: Diagnosis not present

## 2024-01-04 DIAGNOSIS — C3492 Malignant neoplasm of unspecified part of left bronchus or lung: Secondary | ICD-10-CM | POA: Diagnosis not present

## 2024-01-04 DIAGNOSIS — C7951 Secondary malignant neoplasm of bone: Secondary | ICD-10-CM | POA: Diagnosis not present

## 2024-01-04 DIAGNOSIS — D508 Other iron deficiency anemias: Secondary | ICD-10-CM

## 2024-01-04 LAB — CBC
HCT: 24.4 % — ABNORMAL LOW (ref 39.0–52.0)
Hemoglobin: 7.9 g/dL — ABNORMAL LOW (ref 13.0–17.0)
MCH: 28.2 pg (ref 26.0–34.0)
MCHC: 32.4 g/dL (ref 30.0–36.0)
MCV: 87.1 fL (ref 80.0–100.0)
Platelets: 105 10*3/uL — ABNORMAL LOW (ref 150–400)
RBC: 2.8 MIL/uL — ABNORMAL LOW (ref 4.22–5.81)
RDW: 14 % (ref 11.5–15.5)
WBC: 4.2 10*3/uL (ref 4.0–10.5)
nRBC: 0 % (ref 0.0–0.2)

## 2024-01-04 LAB — BASIC METABOLIC PANEL WITH GFR
Anion gap: 9 (ref 5–15)
BUN: 10 mg/dL (ref 8–23)
CO2: 25 mmol/L (ref 22–32)
Calcium: 7.6 mg/dL — ABNORMAL LOW (ref 8.9–10.3)
Chloride: 102 mmol/L (ref 98–111)
Creatinine, Ser: 0.7 mg/dL (ref 0.61–1.24)
GFR, Estimated: >60 mL/min
Glucose, Bld: 89 mg/dL (ref 70–99)
Potassium: 3.5 mmol/L (ref 3.5–5.1)
Sodium: 136 mmol/L (ref 135–145)

## 2024-01-04 LAB — CULTURE, BLOOD (ROUTINE X 2)
Culture: NO GROWTH
Culture: NO GROWTH
Special Requests: ADEQUATE
Special Requests: ADEQUATE

## 2024-01-04 LAB — MAGNESIUM: Magnesium: 2 mg/dL (ref 1.7–2.4)

## 2024-01-04 MED ORDER — IRON SUCROSE 500 MG IVPB - SIMPLE MED
500.0000 mg | Freq: Once | INTRAVENOUS | Status: AC
  Administered 2024-01-04: 500 mg via INTRAVENOUS
  Filled 2024-01-04: qty 500

## 2024-01-04 MED ORDER — MIDODRINE HCL 5 MG PO TABS
5.0000 mg | ORAL_TABLET | Freq: Two times a day (BID) | ORAL | Status: DC
  Administered 2024-01-04 – 2024-01-06 (×5): 5 mg via ORAL
  Filled 2024-01-04 (×5): qty 1

## 2024-01-04 MED ORDER — PANTOPRAZOLE SODIUM 40 MG IV SOLR
40.0000 mg | Freq: Two times a day (BID) | INTRAVENOUS | Status: DC
  Administered 2024-01-04 – 2024-01-06 (×4): 40 mg via INTRAVENOUS
  Filled 2024-01-04 (×4): qty 10

## 2024-01-04 MED ORDER — LACTATED RINGERS IV SOLN: INTRAVENOUS | Status: AC

## 2024-01-04 MED ORDER — SODIUM CHLORIDE 0.9 % IV SOLN
1.0000 g | INTRAVENOUS | Status: DC
  Administered 2024-01-04 – 2024-01-06 (×3): 1 g via INTRAVENOUS
  Filled 2024-01-04 (×3): qty 10

## 2024-01-04 MED ORDER — METOCLOPRAMIDE HCL 5 MG/ML IJ SOLN
10.0000 mg | Freq: Four times a day (QID) | INTRAMUSCULAR | Status: AC
  Administered 2024-01-04 (×2): 10 mg via INTRAVENOUS
  Filled 2024-01-04 (×2): qty 2

## 2024-01-04 NOTE — Progress Notes (Signed)
 Chad Moreno 11:55 AM  Subjective: Patient with some pain that woke him up but doing better now and plans to walk and no new complaints except for an occasional headache Case again discussed with multiple family members  Objective: Vital signs stable afebrile no acute distress abdomen is soft and for the first time during this hospital stay nontender on my exam NG output significant yesterday chemistries okay calcium 3.5 hemoglobin 7.9 white count okay CT reviewed nothing significant and inflammation improved  Assessment: Currently improved  Plan: I do not see any indications with a improved CT scan for colonoscopy at this point and we will try to get his potassium greater than 4 and will add a magnesium level as well to see if that needs to be given as well and I encouraged him to walk and hopefully we can clamp his NG soon and retry liquids and again we answered all of the family's questions  [facility]  office [phone] After 5PM or if no answer call [phone]

## 2024-01-04 NOTE — Progress Notes (Addendum)
 [facility]                                                                               Chad Moreno, is a 83 y.o. male, DOB - Sep 22, 1940, FMW:[id number] Admit date - 12/30/2023    Outpatient Primary MD for the patient is [name], [name], MD  LOS - 5  days    Brief summary   Chad Moreno is a 83 year old man medical history of metastatic small cell lung cancer s/p SRB Teague completed 10/2022 and currently onencorafenib and binimetinib  started on 06/2023, chronic thrombocytopenia, essential hypertension and GERD.  Presented and complaining of diffuse abdominal pain for 1 day, abdominal discomfort, distention, nausea and vomiting x 2.  Also reported several piece of dark and loose stool.  Concern for cecal colitis and bowel obstruction.   X-ray abdomen show gaseous distention of the small bowel measuring up to 4.2 cm compatible with small bowel obstruction.Lucency under the left hemidiaphragm is probably related to gaseous distention of the stomach . Free gas under the left hemidiaphragm is considered less likely but not excluded. This could also be further assessed at the time of CT.  CTA: CT abd/ Pelvis :  no PE, New wall thickening and inflammatory fat stranding about the cecum, consistent with nonspecific infectious or inflammatory colitis. 3. Mildly distended, fluid-filled loops of mid to distal small bowel throughout the abdomen, largest, distal loops measuring up to 4.1 cm in caliber. Diminished volume of a left pleural effusion   Unchanged sclerotic osseous metastases of the posterior right ninth rib and T9 vertebral body. Unchanged sclerotic osseous metastases of the left sacrum and ilium.  Hospitalist has been consulted for further evaluation management of small bowel obstruction, AKI and GI bleed. General surgery on board.    Assessment & Plan    Assessment and Plan:  Bowel obstruction suspect from cecal colitis of unclear etiology.  S/p NGT, clamped ,  pt had a BM in the last 24 hours,. Gen surgery on board and appreciate recommendations. A CT abd and pelvis was ordered for further evaluation.  Continue with conservative management  GI on board.    Cecal colitis  Unclear etiology, discussed with Dr [name],. Its possible that colitis is from immunotherapy. Patient will follow up with Dr [name] on discharge.  Patient continues to have some left upper quadrant abdominal pain associated with nausea.  NG tube connected to suction with significant output over night, about .    Non-small cell lung cancer  Follows up with Dr [name].  on immunotherapy which is on hold.  Hypertension Supine BP are wnl, orthostatic vital signs abnormal.  Midodrine  added.    Acute kidney injury in the setting of vomiting and dehydration Continue with IV fluids Creatinine has improved and creatinine appears to be back at baseline   Heme positive stools/gi bleed Drop in hemoglobin from 10.7 to 9 to 7.9 suspect from cecal colitis/ diverticulosis.  Empirically started the patient on IV protonix  40 mg BID. .  Transfuse to keep hemoglobin greater than 7.  Anemia panel shows iron  deficiency . IV iron  infusion ordered.  Continue to monitor.     Iron  deficiency anemia/mild  thrombocytopenia Continue to monitor and iron  supplementation will be added on discharge.  Orthostatic hypotension Persistently orthostatic despite fluid boluses. Supine BP remains around 120's/60's. Started the patient on midodrine .    Hypoxia Requiring oxygen, . Get CXR.  Encourage  IS use.    Abnormal UA/ Urine cultures are pending.  Started him on IV ceftriaxone .     Estimated body mass index is 28.48 kg/m as calculated from the following:   Height as of this encounter: 6' (1.829 m).   Weight as of this encounter: 95.3 kg.  Code Status: full code.  DVT Prophylaxis:  heparin  injection 5,000 Units Start: 01/01/24 0600 Place and maintain sequential compression  device Start: 12/31/23 0733   Level of Care: Level of care: Med-Surg Family Communication: family at bedside.   Disposition Plan:     Remains inpatient appropriate:  pending clinical improvement.   Procedures:  None.   Consultants:   Gen surgery GI  Antimicrobials:   Anti-infectives (From admission, onward)    Start     Dose/Rate Route Frequency Ordered Stop   01/04/24 0806  cefTRIAXone  (ROCEPHIN ) 1 g in sodium chloride  0.9 % 100 mL IVPB        1 g 200 mL/hr over 30 Minutes Intravenous Every 24 hours 01/04/24 0806          Medications  Scheduled Meds:  Gerhardt's butt cream   Topical QID   heparin   5,000 Units Subcutaneous Q8H   metoCLOPramide  (REGLAN ) injection  10 mg Intravenous Q6H   midodrine   5 mg Oral BID WC   Continuous Infusions:  cefTRIAXone  (ROCEPHIN )  IV 1 g (01/04/24 0959)   lactated ringers      lactated ringers      promethazine (PHENERGAN) injection (IM or IVPB)     PRN Meds:.acetaminophen  **OR** acetaminophen , albuterol , hydrALAZINE , HYDROmorphone  (DILAUDID ) injection, LORazepam , methocarbamol  (ROBAXIN ) injection, ondansetron  **OR** ondansetron  (ZOFRAN ) IV, phenol, promethazine (PHENERGAN) injection (IM or IVPB)    Subjective:   Chad Moreno was seen and examined today. Has BM, passing flatus. Some abdominal pain in the right upper quadrant pain along with nausea.   Objective:   Vitals:   01/03/24 1341 01/03/24 2027 01/04/24 0552 01/04/24 1345  BP: 114/65 (!) 142/67 (!) 161/70 (!) 141/70  Pulse: 71 79 83 78  Resp: 17 17 16 17   Temp: 98.6 F (37 C) 98.4 F (36.9 C) 98.5 F (36.9 C) 98.2 F (36.8 C)  TempSrc: Oral Oral Oral Oral  SpO2: 98% 97% 97% 99%  Weight:      Height:        Intake/Output Summary (Last 24 hours) at 01/04/2024 1607 Last data filed at 01/04/2024 1356 Gross per 24 hour  Intake 935.27 ml  Output 1000 ml  Net -64.73 ml   Filed Weights   12/30/23 0315  Weight: 95.3 kg     Exam General exam: ill appearing  elderly gentleman not in distress.  Respiratory system: Clear to auscultation. Respiratory effort normal. Cardiovascular system: S1 & S2 heard, RRR. No JVD, Gastrointestinal system: Abdomen is soft , tender in the right upper quadrant. Bs+ s/p NGT connected to wall suction.  Central nervous system: Alert and oriented.  Extremities: Symmetric 5 x 5 power. Skin: No rashes,  Psychiatry: Judgement and insight appear normal. Mood & affect appropriate.      Data Reviewed:  I have personally reviewed following labs and imaging studies   CBC Lab Results  Component Value Date   WBC 4.2 01/04/2024   RBC 2.80 (L) 01/04/2024  HGB 7.9 (L) 01/04/2024   HCT 24.4 (L) 01/04/2024   MCV 87.1 01/04/2024   MCH 28.2 01/04/2024   PLT 105 (L) 01/04/2024   MCHC 32.4 01/04/2024   RDW 14.0 01/04/2024   LYMPHSABS 0.3 (L) 01/03/2024   MONOABS 0.5 01/03/2024   EOSABS 0.1 01/03/2024   BASOSABS 0.0 01/03/2024     Last metabolic panel Lab Results  Component Value Date   NA 136 01/04/2024   K 3.5 01/04/2024   CL 102 01/04/2024   CO2 25 01/04/2024   BUN 10 01/04/2024   CREATININE 0.70 01/04/2024   GLUCOSE 89 01/04/2024   GFRNONAA >60 01/04/2024   GFRAA >60 03/11/2019   CALCIUM 7.6 (L) 01/04/2024   PROT 7.3 12/30/2023   ALBUMIN 3.5 12/30/2023   BILITOT 1.0 12/30/2023   ALKPHOS 62 12/30/2023   AST 33 12/30/2023   ALT 18 12/30/2023   ANIONGAP 9 01/04/2024    CBG (last 3)  No results for input(s): GLUCAP in the last 72 hours.    Coagulation Profile: Recent Labs  Lab 12/30/23 0402  INR 0.9     Radiology Studies: CT ABDOMEN PELVIS W CONTRAST Result Date: 01/03/2024 CLINICAL DATA:  Follow-up colitis and bowel obstruction. Abdominal pain. * Tracking Code: BO * EXAM: CT ABDOMEN AND PELVIS WITH CONTRAST TECHNIQUE: Multidetector CT imaging of the abdomen and pelvis was performed using the standard protocol following bolus administration of intravenous contrast. RADIATION DOSE REDUCTION:  This exam was performed according to the departmental dose-optimization program which includes automated exposure control, adjustment of the mA and/or kV according to patient size and/or use of iterative reconstruction technique. CONTRAST:  OMNIPAQUE  IOHEXOL  300 MG/ML  SOLN COMPARISON:  CT abdomen and pelvis dated 12/30/2023 FINDINGS: Lower chest: Post treatment changes of the left upper and right lower lobe. Trace right and moderate left pleural effusions. Partially imaged heart size is normal. Coronary artery calcifications. Hepatobiliary: No focal hepatic lesions. No intra or extrahepatic biliary ductal dilation. Gallbladder fundal adenomyomatosis. Layering hyperattenuation within the gallbladder may represent vicariously excreted contrast. Pancreas: No focal lesions or main ductal dilation. Spleen: Normal in size without focal abnormality. Adrenals/Urinary Tract: Nodular thickening of the left adrenal gland without discrete nodule. No right adrenal nodule. No suspicious renal mass, calculi or hydronephrosis. Mild circumferential thickening of the urinary bladder. Stomach/Bowel: Enteric tube terminates in the gastric body. Normal appearance of the stomach. Small duodenal diverticulum arising from the third portion of the duodenum. Enteric contrast material reaches the rectum. Colonic diverticulosis without acute diverticulitis. Cecum is well distended without mural thickening. Normal appendix. Vascular/Lymphatic: Aortic atherosclerosis. No enlarged abdominal or pelvic lymph nodes. Reproductive: Mildly enlarged prostate gland with fiducials in-situ. Other: Small volume free fluid. No free air or fluid collection. Small fat-containing left inguinal hernia. Musculoskeletal: Unchanged sclerosis of the left sacroiliac joint, right T9 transverse process, and right posterior ninth rib. Multilevel degenerative changes of the partially imaged thoracic and lumbar spine. Mild body wall edema. IMPRESSION: 1. No  evidence of bowel obstruction. Enteric contrast material reaches the rectum. 2. Mild circumferential thickening of the urinary bladder, which may be related to underdistention or cystitis. Recommend correlation with urinalysis. 3. Unchanged sclerosis of the left sacroiliac joint, right T9 transverse process, and right posterior ninth rib, which may represent treated osseous metastases. 4. Trace right and moderate left pleural effusions. 5. Aortic Atherosclerosis (ICD10-I70.0). Electronically Signed   By: Limin  Xu M.D.   On: 01/03/2024 15:52   DG Abd Portable 1V Result Date: 01/03/2024 CLINICAL DATA:  Abdominal bloating cramping EXAM: PORTABLE ABDOMEN - 1 VIEW COMPARISON:  Abdominal radiograph dated 01/01/2024 FINDINGS: Gastric/enteric tube tip projects over the stomach. Bowel gas is seen to the level rectum. Diffuse gas-filled dilation of the colon. No free air or pneumatosis. No abnormal radio-opaque calculi or mass effect. No acute or substantial osseous abnormality. The sacrum and coccyx are partially obscured by overlying bowel contents. Prostate fiducials project over the midline lower pelvis. IMPRESSION: 1. Diffuse gas-filled dilation of the colon, which may represent ileus. 2. Gastric/enteric tube tip projects over the stomach. Electronically Signed   By: Limin  Xu M.D.   On: 01/03/2024 12:03       Elgie Butter M.D. [facility] 01/04/2024, 4:07 PM  Available via Epic secure chat 7am-7pm After 7 pm, please refer to night coverage provider listed on amion.

## 2024-01-04 NOTE — Evaluation (Addendum)
 Physical Therapy Evaluation Patient Details Name: Chad Moreno MRN: [id number] DOB: [birth year] Today's Date: 01/04/2024  History of Present Illness  83 year old man admitted on 12/30/23 with Bowel obstruction suspect from cecal colitis of unclear etiology.  Past medical history of metastatic small cell lung cancer s/p SRB Teague completed 10/2022 and currently onencorafenib and binimetinib  started on 06/2023, chronic thrombocytopenia, essential hypertension and GERD.  Clinical Impression  Pt admitted with above diagnosis.  Pt currently with functional limitations due to the deficits listed below (see PT Problem List). Pt will benefit from acute skilled PT to increase their independence and safety with mobility to allow discharge.  Pt requesting to get into bathroom on arrival.  RN requesting orthostatic vitals.  Pt assisted with sitting and then standing however presents with positive orthostatics (as below and RN notified) and reports significant dizziness.  Pt still attempting to request ambulating into bathroom however recommended urinal at EOB instead for safety at this time.  Pt then assisted back to supine and repositioned with pillows.  Pt typically independent at baseline and reports a flight of stairs to his bedroom.  Anticipate pt to progress to return home upon d/c (seems mostly limited by symptomatic low BP today).       01/04/24 1129  Vital Signs  Pulse Rate Source Dinamap  BP Location Left Arm  BP Method Automatic  Patient Position (if appropriate) Orthostatic Vitals  Orthostatic Lying   BP- Lying 132/66  Pulse- Lying 76  Orthostatic Sitting  BP- Sitting 136/67  Pulse- Sitting 83  Orthostatic Standing at 0 minutes  BP- Standing at 0 minutes (!) 89/63  Pulse- Standing at 0 minutes 99      If plan is discharge home, recommend the following: A little help with walking and/or transfers;A little help with bathing/dressing/bathroom;Assistance with cooking/housework;Help with stairs  or ramp for entrance   Can travel by private vehicle        Equipment Recommendations Rolling walker (2 wheels)  Recommendations for Other Services       Functional Status Assessment Patient has had a recent decline in their functional status and demonstrates the ability to make significant improvements in function in a reasonable and predictable amount of time.     Precautions / Restrictions Precautions Precautions: Fall Precaution/Restrictions Comments: NG tube      Mobility  Bed Mobility Overal bed mobility: Needs Assistance Bed Mobility: Supine to Sit, Sit to Supine     Supine to sit: Mod assist, +2 for safety/equipment Sit to supine: Max assist, +2 for physical assistance   General bed mobility comments: assist for guiding LEs over EOB per his request; pt also required assist for trunk raise; assist for LEs onto bed upon return to supine    Transfers Overall transfer level: Needs assistance Equipment used: Rolling walker (2 wheels) Transfers: Sit to/from Stand Sit to Stand: Min assist, +2 safety/equipment           General transfer comment: light assist to rise; orthostatics obtained and positive; pt also reporting dizziness so deferred further mobility at this time    Ambulation/Gait                  Stairs            Wheelchair Mobility     Tilt Bed    Modified Rankin (Stroke Patients Only)       Balance Overall balance assessment: Needs assistance         Standing balance support: Bilateral upper  extremity supported, Reliant on assistive device for balance, During functional activity   Standing balance comment: reliant on UE support at this time                             Pertinent Vitals/Pain Pain Assessment Pain Assessment: Faces Faces Pain Scale: Hurts even more Pain Location: posterior right rib (has sclerotic osseous metastases of the posterior right  ninth rib) Pain Descriptors / Indicators: Sore Pain  Intervention(s): Repositioned, Monitored during session    Home Living Family/patient expects to be discharged to:: Private residence Living Arrangements: Spouse/significant other   Type of Home: House       Alternate Level Stairs-Number of Steps: flight Home Layout: Two level Home Equipment: None      Prior Function Prior Level of Function : Independent/Modified Independent                     Extremity/Trunk Assessment        Lower Extremity Assessment Lower Extremity Assessment: Generalized weakness    Cervical / Trunk Assessment Cervical / Trunk Assessment: Normal  Communication   Communication Communication: No apparent difficulties    Cognition Arousal: Alert Behavior During Therapy: WFL for tasks assessed/performed   PT - Cognitive impairments: No apparent impairments                         Following commands: Intact       Cueing       General Comments      Exercises     Assessment/Plan    PT Assessment Patient needs continued PT services  PT Problem List         PT Treatment Interventions Gait training;DME instruction;Balance training;Functional mobility training;Therapeutic activities;Therapeutic exercise;Patient/family education;Stair training    PT Goals (Current goals can be found in the Care Plan section)  Acute Rehab PT Goals PT Goal Formulation: With patient Time For Goal Achievement: 01/18/24 Potential to Achieve Goals: Good    Frequency Min 3X/week     Co-evaluation               AM-PAC PT 6 Clicks Mobility  Outcome Measure Help needed turning from your back to your side while in a flat bed without using bedrails?: A Lot Help needed moving from lying on your back to sitting on the side of a flat bed without using bedrails?: A Lot Help needed moving to and from a bed to a chair (including a wheelchair)?: A Lot Help needed standing up from a chair using your arms (e.g., wheelchair or bedside  chair)?: A Lot Help needed to walk in hospital room?: A Lot Help needed climbing 3-5 steps with a railing? : Total 6 Click Score: 11    End of Session Equipment Utilized During Treatment: Gait belt Activity Tolerance: Patient tolerated treatment well Patient left: in bed;with call bell/phone within reach;with bed alarm set;with family/visitor present Nurse Communication: Mobility status PT Visit Diagnosis: Difficulty in walking, not elsewhere classified (R26.2);Muscle weakness (generalized) (M62.81)    Time: 8874-8844 PT Time Calculation (min) (ACUTE ONLY): 30 min   Charges:   PT Evaluation $PT Eval Low Complexity: 1 Low PT Treatments $Therapeutic Activity: 8-22 mins PT General Charges $$ ACUTE PT VISIT: 1 Visit        Chad PT, DPT Physical Therapist Acute Rehabilitation Services Office: [phone]   Chad Moreno 01/04/2024, 12:21 PM

## 2024-01-04 NOTE — Progress Notes (Signed)
 Subjective/Chief Complaint: Still having cramping RLQ abdominal pain NG output still high Passing flatus and having BM's   Objective: Vital signs in last 24 hours: Temp:  [98.4 F (36.9 C)-98.6 F (37 C)] 98.5 F (36.9 C) (07/12 0552) Pulse Rate:  [71-83] 83 (07/12 0552) Resp:  [16-17] 16 (07/12 0552) BP: (114-161)/(65-70) 161/70 (07/12 0552) SpO2:  [97 %-98 %] 97 % (07/12 0552) Last BM Date : 01/03/24  Intake/Output from previous day: 07/11 0701 - 07/12 0700 In: 1108.9 [I.V.:935.3; IV Piggyback:173.7] Out: 1400 [Urine:200; Emesis/NG output:1200] Intake/Output this shift: No intake/output data recorded.  Exam: Awake and alert Abdomen soft, mild to moderate tenderness with guarding in the RLQ  Lab Results:  Recent Labs    01/03/24 0425 01/04/24 0531  WBC 3.9* 4.2  HGB 8.0* 7.9*  HCT 25.2* 24.4*  PLT 113* 105*   BMET Recent Labs    01/03/24 0425 01/04/24 0531  NA 138 136  K 3.6 3.5  CL 104 102  CO2 27 25  GLUCOSE 105* 89  BUN 12 10  CREATININE 0.77 0.70  CALCIUM 7.7* 7.6*   PT/INR No results for input(s): LABPROT, INR in the last 72 hours. ABG No results for input(s): PHART, HCO3 in the last 72 hours.  Invalid input(s): PCO2, PO2  Studies/Results: CT ABDOMEN PELVIS W CONTRAST Result Date: 01/03/2024 CLINICAL DATA:  Follow-up colitis and bowel obstruction. Abdominal pain. * Tracking Code: BO * EXAM: CT ABDOMEN AND PELVIS WITH CONTRAST TECHNIQUE: Multidetector CT imaging of the abdomen and pelvis was performed using the standard protocol following bolus administration of intravenous contrast. RADIATION DOSE REDUCTION: This exam was performed according to the departmental dose-optimization program which includes automated exposure control, adjustment of the mA and/or kV according to patient size and/or use of iterative reconstruction technique. CONTRAST:  OMNIPAQUE  IOHEXOL  300 MG/ML  SOLN COMPARISON:  CT abdomen and pelvis dated  12/30/2023 FINDINGS: Lower chest: Post treatment changes of the left upper and right lower lobe. Trace right and moderate left pleural effusions. Partially imaged heart size is normal. Coronary artery calcifications. Hepatobiliary: No focal hepatic lesions. No intra or extrahepatic biliary ductal dilation. Gallbladder fundal adenomyomatosis. Layering hyperattenuation within the gallbladder may represent vicariously excreted contrast. Pancreas: No focal lesions or main ductal dilation. Spleen: Normal in size without focal abnormality. Adrenals/Urinary Tract: Nodular thickening of the left adrenal gland without discrete nodule. No right adrenal nodule. No suspicious renal mass, calculi or hydronephrosis. Mild circumferential thickening of the urinary bladder. Stomach/Bowel: Enteric tube terminates in the gastric body. Normal appearance of the stomach. Small duodenal diverticulum arising from the third portion of the duodenum. Enteric contrast material reaches the rectum. Colonic diverticulosis without acute diverticulitis. Cecum is well distended without mural thickening. Normal appendix. Vascular/Lymphatic: Aortic atherosclerosis. No enlarged abdominal or pelvic lymph nodes. Reproductive: Mildly enlarged prostate gland with fiducials in-situ. Other: Small volume free fluid. No free air or fluid collection. Small fat-containing left inguinal hernia. Musculoskeletal: Unchanged sclerosis of the left sacroiliac joint, right T9 transverse process, and right posterior ninth rib. Multilevel degenerative changes of the partially imaged thoracic and lumbar spine. Mild body wall edema. IMPRESSION: 1. No evidence of bowel obstruction. Enteric contrast material reaches the rectum. 2. Mild circumferential thickening of the urinary bladder, which may be related to underdistention or cystitis. Recommend correlation with urinalysis. 3. Unchanged sclerosis of the left sacroiliac joint, right T9 transverse process, and right posterior  ninth rib, which may represent treated osseous metastases. 4. Trace right and moderate left pleural  effusions. 5. Aortic Atherosclerosis (ICD10-I70.0). Electronically Signed   By: Limin  Xu M.D.   On: 01/03/2024 15:52   DG Abd Portable 1V Result Date: 01/03/2024 CLINICAL DATA:  Abdominal bloating cramping EXAM: PORTABLE ABDOMEN - 1 VIEW COMPARISON:  Abdominal radiograph dated 01/01/2024 FINDINGS: Gastric/enteric tube tip projects over the stomach. Bowel gas is seen to the level rectum. Diffuse gas-filled dilation of the colon. No free air or pneumatosis. No abnormal radio-opaque calculi or mass effect. No acute or substantial osseous abnormality. The sacrum and coccyx are partially obscured by overlying bowel contents. Prostate fiducials project over the midline lower pelvis. IMPRESSION: 1. Diffuse gas-filled dilation of the colon, which may represent ileus. 2. Gastric/enteric tube tip projects over the stomach. Electronically Signed   By: Limin  Xu M.D.   On: 01/03/2024 12:03    Anti-infectives: Anti-infectives (From admission, onward)    Start     Dose/Rate Route Frequency Ordered Stop   01/04/24 0806  cefTRIAXone  (ROCEPHIN ) 1 g in sodium chloride  0.9 % 100 mL IVPB        1 g 200 mL/hr over 30 Minutes Intravenous Every 24 hours 01/04/24 0806         Assessment/Plan: Ileus likely secondary to colitis of uncertain etiology.  Repeat CT scan of the abdomen pelvis showed only reactive changes.  There was no pneumatosis or further inflammation or wall thickening of the ascending colon.  The appendix was also normal.  White blood count is stable  He is having bowel function but his NG output remains elevated.    He started helping abdominal pain after a clamping trial yesterday so we will continue nasogastric suctioning.  There is no indication for surgical exploration currently.  Hopefully this will improve with conservative management.  GI may still need to consider a  colonoscopy.    Chad Moreno 01/04/2024

## 2024-01-04 NOTE — Plan of Care (Signed)
   Problem: Clinical Measurements: Goal: Diagnostic test results will improve Outcome: Progressing

## 2024-01-04 NOTE — Evaluation (Signed)
 Occupational Therapy Evaluation Patient Details Name: Chad Moreno MRN: [id number] DOB: Jun 11, 1941 Today's Date: 01/04/2024   History of Present Illness   83 year old man admitted on 12/30/23 with Bowel obstruction suspect from cecal colitis of unclear etiology.  Past medical history of metastatic small cell lung cancer s/p SRB Teague completed 10/2022 and currently onencorafenib and binimetinib  started on 06/2023, chronic thrombocytopenia, essential hypertension and GERD.     Clinical Impressions Pt admitted with the above. Pt currently with functional limitations due to the deficits listed below (see OT Problem List).  Pt will benefit from acute skilled OT to increase their safety and independence with ADL and functional mobility for ADL to facilitate discharge.       If plan is discharge home, recommend the following:   A little help with walking and/or transfers;A little help with bathing/dressing/bathroom     Functional Status Assessment   Patient has had a recent decline in their functional status and demonstrates the ability to make significant improvements in function in a reasonable and predictable amount of time.       Precautions/Restrictions   Precautions Precautions: Fall Precaution/Restrictions Comments: NG tube     Mobility Bed Mobility Overal bed mobility: Needs Assistance Bed Mobility: Supine to Sit, Sit to Supine     Supine to sit: Mod assist, +2 for safety/equipment          Transfers                   General transfer comment: did not perform          ADL either performed or assessed with clinical judgement   ADL Overall ADL's : Needs assistance/impaired     Grooming: Wash/dry hands;Wash/dry face;Bed level;Set up   Upper Body Bathing: Minimal assistance;Bed level   Lower Body Bathing: Bed level;Maximal assistance   Upper Body Dressing : Minimal assistance;Bed level   Lower Body Dressing: Maximal assistance;Bed level                  General ADL Comments: pt declined OOB but did agree to in bed activity as well as BUE ROM and positioning.     Vision   Vision Assessment?: No apparent visual deficits            Pertinent Vitals/Pain Pain Assessment Pain Score: 3  Pain Location: rib area Pain Descriptors / Indicators: Sore     Extremity/Trunk Assessment Upper Extremity Assessment Upper Extremity Assessment: Generalized weakness           Communication     Cognition Arousal: Alert Behavior During Therapy: WFL for tasks assessed/performed Cognition: No apparent impairments                                       Cueing  General Comments       Pts BP dropped with PT and RN came to bring iron .  Educated pt on keeping HOB raised and moving arms around in bed.  Pt open to suggestions.  Explained benefits of BUE AROM. Pt very willing!           Home Living Family/patient expects to be discharged to:: Private residence Living Arrangements: Spouse/significant other   Type of Home: House       Home Layout: Two level Alternate Level Stairs-Number of Steps: flight             Home Equipment: None  Prior Functioning/Environment Prior Level of Function : Independent/Modified Independent                    OT Problem List: Decreased strength;Decreased safety awareness;Decreased activity tolerance;Impaired balance (sitting and/or standing)   OT Treatment/Interventions:        OT Goals(Current goals can be found in the care plan section)   Acute Rehab OT Goals Patient Stated Goal: playgolf OT Goal Formulation: With patient Time For Goal Achievement: 01/18/24 Potential to Achieve Goals: Good   OT Frequency:  Min 2X/week       AM-PAC OT 6 Clicks Daily Activity     Outcome Measure Help from another person eating meals?: A Little Help from another person taking care of personal grooming?: A Little Help from another person  toileting, which includes using toliet, bedpan, or urinal?: Total Help from another person bathing (including washing, rinsing, drying)?: Total Help from another person to put on and taking off regular upper body clothing?: Total Help from another person to put on and taking off regular lower body clothing?: Total 6 Click Score: 10   End of Session Nurse Communication: Mobility status  Activity Tolerance: Patient tolerated treatment well Patient left: in bed  OT Visit Diagnosis: Unsteadiness on feet (R26.81);Ataxia, unspecified (R27.0);Muscle weakness (generalized) (M62.81)                Time: 8792-8765 OT Time Calculation (min): 27 min Charges:  OT General Charges $OT Visit: 1 Visit OT Evaluation $OT Eval Moderate Complexity: 1 Mod OT Treatments $Self Care/Home Management : 8-22 mins    Zerenity Bowron D 01/04/2024, 4:07 PM

## 2024-01-04 NOTE — TOC Progression Note (Signed)
 Transition of Care [facility]) - Progression Note    Patient Details  Name: Chad Moreno MRN: [id number] Date of Birth: [birth year]  Transition of Care [facility]) CM/SW Contact  Sheri ONEIDA [place], [place] Phone Number: 01/04/2024, 12:41 PM  Clinical Narrative:    Pt recommended RW and HHPT. CSW ordered RW via [employer]. Pt undecided on if he wants HHPT. TOC to f/u on [facility].       Expected Discharge Plan and Services                                               Social Determinants of Health (SDOH) Interventions SDOH Screenings   Food Insecurity: No Food Insecurity (12/30/2023)  Housing: Low Risk  (12/30/2023)  Transportation Needs: No Transportation Needs (12/30/2023)  Utilities: Not At Risk (12/30/2023)  Depression (PHQ2-9): Low Risk  (07/31/2022)  Social Connections: Socially Integrated (12/30/2023)  Tobacco Use: Medium Risk (12/30/2023)    Readmission Risk Interventions    12/31/2023    1:01 PM  Readmission Risk Prevention Plan  Transportation Screening Complete  PCP or Specialist Appt within 5-7 Days Complete  Home Care Screening Complete  Medication Review (RN CM) Complete

## 2024-01-05 DIAGNOSIS — C7951 Secondary malignant neoplasm of bone: Secondary | ICD-10-CM | POA: Diagnosis not present

## 2024-01-05 DIAGNOSIS — C3492 Malignant neoplasm of unspecified part of left bronchus or lung: Secondary | ICD-10-CM | POA: Diagnosis not present

## 2024-01-05 DIAGNOSIS — K56609 Unspecified intestinal obstruction, unspecified as to partial versus complete obstruction: Secondary | ICD-10-CM | POA: Diagnosis not present

## 2024-01-05 DIAGNOSIS — D508 Other iron deficiency anemias: Secondary | ICD-10-CM | POA: Diagnosis not present

## 2024-01-05 LAB — CBC WITH DIFFERENTIAL/PLATELET
Abs Immature Granulocytes: 0.18 K/uL — ABNORMAL HIGH (ref 0.00–0.07)
Basophils Absolute: 0 K/uL (ref 0.0–0.1)
Basophils Relative: 1 %
Eosinophils Absolute: 0.1 K/uL (ref 0.0–0.5)
Eosinophils Relative: 2 %
HCT: 23.5 % — ABNORMAL LOW (ref 39.0–52.0)
Hemoglobin: 7.5 g/dL — ABNORMAL LOW (ref 13.0–17.0)
Immature Granulocytes: 4 %
Lymphocytes Relative: 8 %
Lymphs Abs: 0.3 K/uL — ABNORMAL LOW (ref 0.7–4.0)
MCH: 28.4 pg (ref 26.0–34.0)
MCHC: 31.9 g/dL (ref 30.0–36.0)
MCV: 89 fL (ref 80.0–100.0)
Monocytes Absolute: 0.5 K/uL (ref 0.1–1.0)
Monocytes Relative: 12 %
Neutro Abs: 3.2 K/uL (ref 1.7–7.7)
Neutrophils Relative %: 73 %
Platelets: 115 K/uL — ABNORMAL LOW (ref 150–400)
RBC: 2.64 MIL/uL — ABNORMAL LOW (ref 4.22–5.81)
RDW: 14.4 % (ref 11.5–15.5)
WBC: 4.3 K/uL (ref 4.0–10.5)
nRBC: 0 % (ref 0.0–0.2)

## 2024-01-05 LAB — BASIC METABOLIC PANEL WITH GFR
Anion gap: 8 (ref 5–15)
BUN: 11 mg/dL (ref 8–23)
CO2: 27 mmol/L (ref 22–32)
Calcium: 7.6 mg/dL — ABNORMAL LOW (ref 8.9–10.3)
Chloride: 104 mmol/L (ref 98–111)
Creatinine, Ser: 0.71 mg/dL (ref 0.61–1.24)
GFR, Estimated: >60 mL/min (ref >60)
Glucose, Bld: 90 mg/dL (ref 70–99)
Potassium: 3.6 mmol/L (ref 3.5–5.1)
Sodium: 139 mmol/L (ref 135–145)

## 2024-01-05 LAB — PREPARE RBC (CROSSMATCH)

## 2024-01-05 MED ORDER — LACTATED RINGERS IV SOLN: INTRAVENOUS | Status: AC

## 2024-01-05 MED ORDER — SODIUM CHLORIDE 0.9% IV SOLUTION: Freq: Once | INTRAVENOUS | Status: AC

## 2024-01-05 MED ORDER — METOCLOPRAMIDE HCL 5 MG/ML IJ SOLN
10.0000 mg | Freq: Four times a day (QID) | INTRAMUSCULAR | Status: AC
  Administered 2024-01-05 (×2): 10 mg via INTRAVENOUS
  Filled 2024-01-05 (×2): qty 2

## 2024-01-05 NOTE — Progress Notes (Signed)
 2327- Hospitalist Blondie informed that patient abdomen is a little bit distended, NGT clamped, Patient had bowel movement today morning and passing gas. No complain of Nausea and vomiting.   Will continue to monitor.

## 2024-01-05 NOTE — Progress Notes (Signed)
 Chad Moreno 8:57 AM  Subjective: Patient had an excellent night sleep and does not have pain this morning and his NG output has decreased but unfortunately he still has orthostatic hypotension which prevents him from getting out of bed and no other new complaints Objective: Vital signs stable afebrile no acute distress abdomen is soft nontender potassium 3 6 hemoglobin 7.5  Assessment: Improved  Plan: Consider a transfusion to help build him up and continue to try to get potassium greater than 4 and once orthostatic hypotension is better hopefully he can increase his physical therapy  [facility]  office [phone] After 5PM or if no answer call [phone]

## 2024-01-05 NOTE — Progress Notes (Signed)
 [facility]                                                                               Laden Fieldhouse, is a 83 y.o. male, DOB - Nov 22, 1940, FMW:[id number] Admit date - 12/30/2023    Outpatient Primary MD for the patient is [name], [name], MD  LOS - 6  days    Brief summary   Chad Moreno is a 83 year old man medical history of metastatic small cell lung cancer s/p SRB Teague completed 10/2022 and currently onencorafenib and binimetinib  started on 06/2023, chronic thrombocytopenia, essential hypertension and GERD.  Presented and complaining of diffuse abdominal pain for 1 day, abdominal discomfort, distention, nausea and vomiting x 2.  Also reported several piece of dark and loose stool.  Concern for cecal colitis and bowel obstruction.   X-ray abdomen show gaseous distention of the small bowel measuring up to 4.2 cm compatible with small bowel obstruction.Lucency under the left hemidiaphragm is probably related to gaseous distention of the stomach . Free gas under the left hemidiaphragm is considered less likely but not excluded. This could also be further assessed at the time of CT.  CTA: CT abd/ Pelvis :  no PE, New wall thickening and inflammatory fat stranding about the cecum, consistent with nonspecific infectious or inflammatory colitis. 3. Mildly distended, fluid-filled loops of mid to distal small bowel throughout the abdomen, largest, distal loops measuring up to 4.1 cm in caliber. Diminished volume of a left pleural effusion   Unchanged sclerotic osseous metastases of the posterior right ninth rib and T9 vertebral body. Unchanged sclerotic osseous metastases of the left sacrum and ilium.  Hospitalist has been consulted for further evaluation management of small bowel obstruction, AKI and GI bleed. General surgery on board.    Assessment & Plan    Assessment and Plan:  Bowel obstruction suspect from cecal colitis of unclear etiology.  S/p NGT, clamped ,  pt had a BM in the last 24 hours,. Gen surgery on board and appreciate recommendations. A CT abd and pelvis was ordered for further evaluation. No evidence of bowel obstruction. Enteric contrast material reaches the rectum. Continue with conservative management . Ngt in place. Started on clears . Monitor.  GI on board.    Cecal colitis  Unclear etiology, discussed with Dr [name],. Its possible that colitis is from immunotherapy. Patient will follow up with Dr [name] on discharge.  Patient continues to have some left upper quadrant abdominal pain associated with nausea.  NG tube connected to suction with significant output over night, about .    Non-small cell lung cancer  Follows up with Dr [name].  on immunotherapy which is on hold.  Hypertension Supine BP are wnl, orthostatic vital signs abnormal.  Midodrine  added.    Acute kidney injury in the setting of vomiting and dehydration Continue with IV fluids Creatinine has improved and creatinine appears to be back at baseline   Heme positive stools/gi bleed Drop in hemoglobin from 10.7 to 9 to 7.9 to 7.5 suspect from cecal colitis/ diverticulosis.  Empirically started the patient on IV protonix  40 mg BID. SABRA  1 unit of prbc transfusion ordered.  Anemia  panel shows iron  deficiency . IV iron  infusion ordered.  Continue to monitor.     Iron  deficiency anemia/mild thrombocytopenia Continue to monitor and iron  supplementation will be added on discharge.  Orthostatic hypotension Persistently orthostatic despite fluid boluses. Supine BP remains around 120's/60's. Started the patient on midodrine .    Hypoxia Cxr is negative for acute disease.  Encourage  IS use.     E coli UTI:  Started him on IV ceftriaxone .     Estimated body mass index is 28.48 kg/m as calculated from the following:   Height as of this encounter: 6' (1.829 m).   Weight as of this encounter: 95.3 kg.  Code Status: full code.  DVT  Prophylaxis:  heparin  injection 5,000 Units Start: 01/01/24 0600 Place and maintain sequential compression device Start: 12/31/23 0733   Level of Care: Level of care: Med-Surg Family Communication: family at bedside.   Disposition Plan:     Remains inpatient appropriate:  pending clinical improvement.   Procedures:  None.   Consultants:   Gen surgery GI  Antimicrobials:   Anti-infectives (From admission, onward)    Start     Dose/Rate Route Frequency Ordered Stop   01/04/24 0806  cefTRIAXone  (ROCEPHIN ) 1 g in sodium chloride  0.9 % 100 mL IVPB        1 g 200 mL/hr over 30 Minutes Intravenous Every 24 hours 01/04/24 0806          Medications  Scheduled Meds:  sodium chloride    Intravenous Once   Gerhardt's butt cream   Topical QID   heparin   5,000 Units Subcutaneous Q8H   midodrine   5 mg Oral BID WC   pantoprazole  (PROTONIX ) IV  40 mg Intravenous Q12H   Continuous Infusions:  cefTRIAXone  (ROCEPHIN )  IV 1 g (01/05/24 0813)   lactated ringers      lactated ringers      promethazine (PHENERGAN) injection (IM or IVPB)     PRN Meds:.acetaminophen  **OR** acetaminophen , albuterol , hydrALAZINE , HYDROmorphone  (DILAUDID ) injection, LORazepam , methocarbamol  (ROBAXIN ) injection, ondansetron  **OR** ondansetron  (ZOFRAN ) IV, phenol, promethazine (PHENERGAN) injection (IM or IVPB)    Subjective:   Chad Moreno was seen and examined today. Reports feeling better , having BM and flatus.   Objective:   Vitals:   01/04/24 1345 01/04/24 2133 01/05/24 0527 01/05/24 1453  BP: (!) 141/70 134/60 (!) 122/54 (!) 150/78  Pulse: 78 79 67 77  Resp: 17 17 16 16   Temp: 98.2 F (36.8 C) 99 F (37.2 C) (!) 97.5 F (36.4 C) 98.4 F (36.9 C)  TempSrc: Oral Oral Oral Oral  SpO2: 99% 93% 92% 97%  Weight:      Height:        Intake/Output Summary (Last 24 hours) at 01/05/2024 1833 Last data filed at 01/05/2024 1810 Gross per 24 hour  Intake 2075.53 ml  Output 800 ml  Net 1275.53 ml    Filed Weights   12/30/23 0315  Weight: 95.3 kg     Exam General exam: Appears calm and comfortable  Respiratory system: Clear to auscultation. Respiratory effort normal. Cardiovascular system: S1 & S2 heard, RRR. No JVD Gastrointestinal system: Abdomen is soft , tender in the RUQ. BS+, S/P NGT. Clamped.  Central nervous system: Alert and oriented. No focal neurological deficits. Extremities: Symmetric 5 x 5 power. Skin: No rashes,  Psychiatry: Mood & affect appropriate.       Data Reviewed:  I have personally reviewed following labs and imaging studies   CBC Lab Results  Component Value Date  WBC 4.3 01/05/2024   RBC 2.64 (L) 01/05/2024   HGB 7.5 (L) 01/05/2024   HCT 23.5 (L) 01/05/2024   MCV 89.0 01/05/2024   MCH 28.4 01/05/2024   PLT 115 (L) 01/05/2024   MCHC 31.9 01/05/2024   RDW 14.4 01/05/2024   LYMPHSABS 0.3 (L) 01/05/2024   MONOABS 0.5 01/05/2024   EOSABS 0.1 01/05/2024   BASOSABS 0.0 01/05/2024     Last metabolic panel Lab Results  Component Value Date   NA 139 01/05/2024   K 3.6 01/05/2024   CL 104 01/05/2024   CO2 27 01/05/2024   BUN 11 01/05/2024   CREATININE 0.71 01/05/2024   GLUCOSE 90 01/05/2024   GFRNONAA >60 01/05/2024   GFRAA >60 03/11/2019   CALCIUM 7.6 (L) 01/05/2024   PROT 7.3 12/30/2023   ALBUMIN 3.5 12/30/2023   BILITOT 1.0 12/30/2023   ALKPHOS 62 12/30/2023   AST 33 12/30/2023   ALT 18 12/30/2023   ANIONGAP 8 01/05/2024    CBG (last 3)  No results for input(s): GLUCAP in the last 72 hours.    Coagulation Profile: Recent Labs  Lab 12/30/23 0402  INR 0.9     Radiology Studies: DG CHEST PORT 1 VIEW Result Date: 01/04/2024 CLINICAL DATA:  200808 Hypoxia [id number] EXAM: PORTABLE CHEST 1 VIEW COMPARISON:  December 30, 2023 FINDINGS: The cardiomediastinal silhouette is unchanged in contour. Similar irregular RIGHT perihilar contour compared to prior with associated biopsy clip. Similar biopsy clip in association with a LEFT  superior lung mass. The enteric tube courses through the chest to the abdomen beyond the field-of-view. Atherosclerotic calcifications of the aorta. No pleural effusion. No pneumothorax. No acute pleuroparenchymal abnormality. Lytic lesion of the LEFT posterolateral third rib. IMPRESSION: 1. No acute cardiopulmonary abnormality. 2. Similar appearance of known pulmonary malignancy including a metastatic lesion of the LEFT posterior third rib. Electronically Signed   By: Corean Salter M.D.   On: 01/04/2024 17:53       Elgie Butter M.D. [facility] 01/05/2024, 6:33 PM  Available via Epic secure chat 7am-7pm After 7 pm, please refer to night coverage provider listed on amion.

## 2024-01-05 NOTE — Progress Notes (Signed)
 Occupational Therapy Treatment Patient Details Name: Chad Moreno MRN: [id number] DOB: 07/11/1940 Today's Date: 01/05/2024   History of present illness 83 year old man admitted on 12/30/23 with Bowel obstruction suspect from cecal colitis of unclear etiology.  Past medical history of metastatic small cell lung cancer s/p SRB Teague completed 10/2022 and currently onencorafenib and binimetinib  started on 06/2023, chronic thrombocytopenia, essential hypertension and GERD.   OT comments  May consider CIR consult. Pt very motivated and has great family support      If plan is discharge home, recommend the following:  A little help with walking and/or transfers;A little help with bathing/dressing/bathroom      Recommendations for Other Services Rehab consult    Precautions / Restrictions Precautions Precautions: Fall Precaution/Restrictions Comments: NG tube              ADL either performed or assessed with clinical judgement   ADL                                         General ADL Comments: OT session focused on BUE exercise.  Pt sat in chair earlier!  Encouraged pt to sit EOB with OT but he did decline as he hadnt beebn back in bed too long. Pt in good spirits and seemed to be feeling better! Pt had done BUE exercise as instructed by OT yesterday.  OT added yellow therand to pts instructions.    Extremity/Trunk Assessment Upper Extremity Assessment Upper Extremity Assessment: Generalized weakness (provided yellow theraband and instructed in BUE exercise to increase strength)            Vision   Vision Assessment?: No apparent visual deficits             Cognition Arousal: Alert Behavior During Therapy: WFL for tasks assessed/performed Cognition: No apparent impairments                                                      Pertinent Vitals/ Pain       Pain Assessment Pain Assessment: No/denies pain         Frequency   Min 2X/week        Progress Toward Goals  OT Goals(current goals can now be found in the care plan section)  Progress towards OT goals: Progressing toward goals     Plan         AM-PAC OT 6 Clicks Daily Activity     Outcome Measure   Help from another person eating meals?: A Little Help from another person taking care of personal grooming?: A Lot Help from another person toileting, which includes using toliet, bedpan, or urinal?: A Lot Help from another person bathing (including washing, rinsing, drying)?: A Lot Help from another person to put on and taking off regular upper body clothing?: A Lot Help from another person to put on and taking off regular lower body clothing?: A Lot 6 Click Score: 13    End of Session    OT Visit Diagnosis: Unsteadiness on feet (R26.81);Ataxia, unspecified (R27.0);Muscle weakness (generalized) (M62.81)   Activity Tolerance Patient tolerated treatment well   Patient Left in bed           Time: 1700-1715 OT Time Calculation (min):  15 min  Charges: OT General Charges $OT Visit: 1 Visit OT Treatments $Therapeutic Exercise: 8-22 mins    Christyl Osentoski, Norvel BIRCH 01/05/2024, 5:33 PM

## 2024-01-05 NOTE — Progress Notes (Signed)
 Subjective/Chief Complaint: He reports less pain this morning and has had several more bowel movements.  His NG output is less   Objective: Vital signs in last 24 hours: Temp:  [97.5 F (36.4 C)-99 F (37.2 C)] 97.5 F (36.4 C) (07/13 0527) Pulse Rate:  [67-79] 67 (07/13 0527) Resp:  [16-17] 16 (07/13 0527) BP: (122-141)/(54-70) 122/54 (07/13 0527) SpO2:  [92 %-99 %] 92 % (07/13 0527) Last BM Date : 01/05/24  Intake/Output from previous day: 07/12 0701 - 07/13 0700 In: 1193.2 [P.O.:60; I.V.:1033.2; IV Piggyback:100] Out: 1550 [Urine:550; Emesis/NG output:1000] Intake/Output this shift: No intake/output data recorded.  Exam: He does appear more comfortable today His abdomen is soft.  There is less tenderness with guarding in the right lower quadrant.  Lab Results:  Recent Labs    01/04/24 0531 01/05/24 0534  WBC 4.2 4.3  HGB 7.9* 7.5*  HCT 24.4* 23.5*  PLT 105* 115*   BMET Recent Labs    01/04/24 0531 01/05/24 0534  NA 136 139  K 3.5 3.6  CL 102 104  CO2 25 27  GLUCOSE 89 90  BUN 10 11  CREATININE 0.70 0.71  CALCIUM 7.6* 7.6*   PT/INR No results for input(s): LABPROT, INR in the last 72 hours. ABG No results for input(s): PHART, HCO3 in the last 72 hours.  Invalid input(s): PCO2, PO2  Studies/Results: DG CHEST PORT 1 VIEW Result Date: 01/04/2024 CLINICAL DATA:  200808 Hypoxia [id number] EXAM: PORTABLE CHEST 1 VIEW COMPARISON:  December 30, 2023 FINDINGS: The cardiomediastinal silhouette is unchanged in contour. Similar irregular RIGHT perihilar contour compared to prior with associated biopsy clip. Similar biopsy clip in association with a LEFT superior lung mass. The enteric tube courses through the chest to the abdomen beyond the field-of-view. Atherosclerotic calcifications of the aorta. No pleural effusion. No pneumothorax. No acute pleuroparenchymal abnormality. Lytic lesion of the LEFT posterolateral third rib. IMPRESSION: 1. No acute  cardiopulmonary abnormality. 2. Similar appearance of known pulmonary malignancy including a metastatic lesion of the LEFT posterior third rib. Electronically Signed   By: Corean Salter M.D.   On: 01/04/2024 17:53   CT ABDOMEN PELVIS W CONTRAST Result Date: 01/03/2024 CLINICAL DATA:  Follow-up colitis and bowel obstruction. Abdominal pain. * Tracking Code: BO * EXAM: CT ABDOMEN AND PELVIS WITH CONTRAST TECHNIQUE: Multidetector CT imaging of the abdomen and pelvis was performed using the standard protocol following bolus administration of intravenous contrast. RADIATION DOSE REDUCTION: This exam was performed according to the departmental dose-optimization program which includes automated exposure control, adjustment of the mA and/or kV according to patient size and/or use of iterative reconstruction technique. CONTRAST:  OMNIPAQUE  IOHEXOL  300 MG/ML  SOLN COMPARISON:  CT abdomen and pelvis dated 12/30/2023 FINDINGS: Lower chest: Post treatment changes of the left upper and right lower lobe. Trace right and moderate left pleural effusions. Partially imaged heart size is normal. Coronary artery calcifications. Hepatobiliary: No focal hepatic lesions. No intra or extrahepatic biliary ductal dilation. Gallbladder fundal adenomyomatosis. Layering hyperattenuation within the gallbladder may represent vicariously excreted contrast. Pancreas: No focal lesions or main ductal dilation. Spleen: Normal in size without focal abnormality. Adrenals/Urinary Tract: Nodular thickening of the left adrenal gland without discrete nodule. No right adrenal nodule. No suspicious renal mass, calculi or hydronephrosis. Mild circumferential thickening of the urinary bladder. Stomach/Bowel: Enteric tube terminates in the gastric body. Normal appearance of the stomach. Small duodenal diverticulum arising from the third portion of the duodenum. Enteric contrast material reaches the rectum. Colonic diverticulosis  without acute  diverticulitis. Cecum is well distended without mural thickening. Normal appendix. Vascular/Lymphatic: Aortic atherosclerosis. No enlarged abdominal or pelvic lymph nodes. Reproductive: Mildly enlarged prostate gland with fiducials in-situ. Other: Small volume free fluid. No free air or fluid collection. Small fat-containing left inguinal hernia. Musculoskeletal: Unchanged sclerosis of the left sacroiliac joint, right T9 transverse process, and right posterior ninth rib. Multilevel degenerative changes of the partially imaged thoracic and lumbar spine. Mild body wall edema. IMPRESSION: 1. No evidence of bowel obstruction. Enteric contrast material reaches the rectum. 2. Mild circumferential thickening of the urinary bladder, which may be related to underdistention or cystitis. Recommend correlation with urinalysis. 3. Unchanged sclerosis of the left sacroiliac joint, right T9 transverse process, and right posterior ninth rib, which may represent treated osseous metastases. 4. Trace right and moderate left pleural effusions. 5. Aortic Atherosclerosis (ICD10-I70.0). Electronically Signed   By: Limin  Xu M.D.   On: 01/03/2024 15:52    Anti-infectives: Anti-infectives (From admission, onward)    Start     Dose/Rate Route Frequency Ordered Stop   01/04/24 0806  cefTRIAXone  (ROCEPHIN ) 1 g in sodium chloride  0.9 % 100 mL IVPB        1 g 200 mL/hr over 30 Minutes Intravenous Every 24 hours 01/04/24 0806         Assessment/Plan: Ileus likely secondary to colitis of uncertain etiology  Clinically, he is improved today so we will clamp the nasogastric tube and start him back on clear liquids.  I have added 2 more doses of Reglan  as well.  His hemoglobin has continued to decrease.  I agree with GI that he may benefit from a transfusion given his orthostasis.  I will defer to the hospitalist regarding this.  As the nasogastric tube was difficult to insert, we will leave it in until he clearly is improving  regarding his GI function  LOS: 6 days    Vicenta Poli MD 01/05/2024

## 2024-01-05 NOTE — Plan of Care (Signed)

## 2024-01-06 ENCOUNTER — Telehealth: Payer: Self-pay

## 2024-01-06 ENCOUNTER — Telehealth: Payer: Self-pay | Admitting: Internal Medicine

## 2024-01-06 DIAGNOSIS — D509 Iron deficiency anemia, unspecified: Secondary | ICD-10-CM | POA: Insufficient documentation

## 2024-01-06 DIAGNOSIS — C7951 Secondary malignant neoplasm of bone: Secondary | ICD-10-CM | POA: Diagnosis not present

## 2024-01-06 DIAGNOSIS — C3492 Malignant neoplasm of unspecified part of left bronchus or lung: Secondary | ICD-10-CM | POA: Diagnosis not present

## 2024-01-06 DIAGNOSIS — K56609 Unspecified intestinal obstruction, unspecified as to partial versus complete obstruction: Secondary | ICD-10-CM | POA: Diagnosis not present

## 2024-01-06 LAB — TYPE AND SCREEN
ABO/RH(D): A POS
Antibody Screen: NEGATIVE
Unit division: 0

## 2024-01-06 LAB — URINE CULTURE: Culture: >=100000 — AB

## 2024-01-06 LAB — BPAM RBC
Blood Product Expiration Date: 202508092359
ISSUE DATE / TIME: 202507132310
Unit Type and Rh: 6200

## 2024-01-06 LAB — CBC WITH DIFFERENTIAL/PLATELET
Abs Immature Granulocytes: 0.32 K/uL — ABNORMAL HIGH (ref 0.00–0.07)
Basophils Absolute: 0 K/uL (ref 0.0–0.1)
Basophils Relative: 1 %
Eosinophils Absolute: 0.1 K/uL (ref 0.0–0.5)
Eosinophils Relative: 1 %
HCT: 29.9 % — ABNORMAL LOW (ref 39.0–52.0)
Hemoglobin: 9.5 g/dL — ABNORMAL LOW (ref 13.0–17.0)
Immature Granulocytes: 6 %
Lymphocytes Relative: 7 %
Lymphs Abs: 0.4 K/uL — ABNORMAL LOW (ref 0.7–4.0)
MCH: 28.1 pg (ref 26.0–34.0)
MCHC: 31.8 g/dL (ref 30.0–36.0)
MCV: 88.5 fL (ref 80.0–100.0)
Monocytes Absolute: 0.7 K/uL (ref 0.1–1.0)
Monocytes Relative: 13 %
Neutro Abs: 3.6 K/uL (ref 1.7–7.7)
Neutrophils Relative %: 72 %
Platelets: 112 K/uL — ABNORMAL LOW (ref 150–400)
RBC: 3.38 MIL/uL — ABNORMAL LOW (ref 4.22–5.81)
RDW: 14.3 % (ref 11.5–15.5)
WBC: 5.1 K/uL (ref 4.0–10.5)
nRBC: 0.6 % — ABNORMAL HIGH (ref 0.0–0.2)

## 2024-01-06 MED ORDER — NITROFURANTOIN MONOHYD MACRO 100 MG PO CAPS
100.0000 mg | ORAL_CAPSULE | Freq: Two times a day (BID) | ORAL | Status: DC
  Administered 2024-01-06 – 2024-01-08 (×5): 100 mg via ORAL
  Filled 2024-01-06 (×5): qty 1

## 2024-01-06 MED ORDER — PANTOPRAZOLE SODIUM 40 MG PO TBEC
40.0000 mg | DELAYED_RELEASE_TABLET | Freq: Two times a day (BID) | ORAL | Status: DC
  Administered 2024-01-06 – 2024-01-08 (×4): 40 mg via ORAL
  Filled 2024-01-06 (×4): qty 1

## 2024-01-06 MED ORDER — GLUCERNA SHAKE PO LIQD
237.0000 mL | Freq: Two times a day (BID) | ORAL | Status: DC
  Administered 2024-01-06 – 2024-01-08 (×5): 237 mL via ORAL
  Filled 2024-01-06 (×6): qty 237

## 2024-01-06 MED ORDER — SODIUM CHLORIDE 0.9% FLUSH: 10.0000 mL | INTRAVENOUS | Status: DC | PRN

## 2024-01-06 NOTE — Progress Notes (Signed)
 [facility] Gastroenterology Progress Note  Chad Moreno 83 y.o. [birth year]   Subjective: Feels better today with less pain.  NG removed. Two bowel movements today so far. Denies N/V. Tolerating full liquids.    Objective: Vital signs: Vitals:   01/06/24 0533 01/06/24 1325  BP: (!) 161/77 (!) 140/71  Pulse: 85 79  Resp: 14 18  Temp:  99.2 F (37.3 C)  SpO2: 97% 92%    Physical Exam: Gen: alert, no acute distress, elderly, pleasant HEENT: anicteric sclera CV: RRR Chest: CTA B Abd: right-sided tenderness with guarding, otherwise nontender, soft, nondistended, +BS Ext: no edema  Lab Results: Recent Labs    01/04/24 0531 01/05/24 0534  NA 136 139  K 3.5 3.6  CL 102 104  CO2 25 27  GLUCOSE 89 90  BUN 10 11  CREATININE 0.70 0.71  CALCIUM 7.6* 7.6*  MG 2.0  --    No results for input(s): AST, ALT, ALKPHOS, BILITOT, PROT, ALBUMIN in the last 72 hours. Recent Labs    01/05/24 0534 01/06/24 1206  WBC 4.3 5.1  NEUTROABS 3.2 3.6  HGB 7.5* 9.5*  HCT 23.5* 29.9*  MCV 89.0 88.5  PLT 115* 112*      Assessment/Plan: Resolving ileus - tolerating full liquid diet. Diet advancement per surgery. Supportive care. Will follow.   [name] 01/06/2024, 2:23 PM  Questions please call [phone] ID: Chad Moreno, male   DOB: 08-13-1940, 83 y.o.   MRN: [id number]

## 2024-01-06 NOTE — Progress Notes (Signed)
 Subjective: Had some pain overnight, but then had a large BM and a lot of flatus.  Felt much better after.  Tolerating CLD with NGT clamped.  Returned to suction with 0cc residual.  ROS: See above, otherwise other systems negative  Objective: Vital signs in last 24 hours: Temp:  [97.6 F (36.4 C)-98.6 F (37 C)] 97.6 F (36.4 C) (07/14 0242) Pulse Rate:  [77-85] 85 (07/14 0533) Resp:  [14-16] 14 (07/14 0533) BP: (137-178)/(67-80) 161/77 (07/14 0533) SpO2:  [97 %-99 %] 97 % (07/14 0533) Last BM Date : 01/05/24  Intake/Output from previous day: 07/13 0701 - 07/14 0700 In: 2509.8 [P.O.:500; I.V.:1609.8; Blood:300; IV Piggyback:100] Out: 200 [Urine:200] Intake/Output this shift: No intake/output data recorded.  PE: Gen: NAD Lungs: effort nonlabored Abd: soft, minimally tender in RLQ today, mild distention  Lab Results:  Recent Labs    01/04/24 0531 01/05/24 0534  WBC 4.2 4.3  HGB 7.9* 7.5*  HCT 24.4* 23.5*  PLT 105* 115*   BMET Recent Labs    01/04/24 0531 01/05/24 0534  NA 136 139  K 3.5 3.6  CL 102 104  CO2 25 27  GLUCOSE 89 90  BUN 10 11  CREATININE 0.70 0.71  CALCIUM 7.6* 7.6*   PT/INR No results for input(s): LABPROT, INR in the last 72 hours.  CMP     Component Value Date/Time   NA 139 01/05/2024 0534   K 3.6 01/05/2024 0534   CL 104 01/05/2024 0534   CO2 27 01/05/2024 0534   GLUCOSE 90 01/05/2024 0534   BUN 11 01/05/2024 0534   CREATININE 0.71 01/05/2024 0534   CREATININE 1.08 12/18/2023 1100   CALCIUM 7.6 (L) 01/05/2024 0534   PROT 7.3 12/30/2023 0534   ALBUMIN 3.5 12/30/2023 0534   AST 33 12/30/2023 0534   AST 15 12/18/2023 1100   ALT 18 12/30/2023 0534   ALT 12 12/18/2023 1100   ALKPHOS 62 12/30/2023 0534   BILITOT 1.0 12/30/2023 0534   BILITOT 0.3 12/18/2023 1100   GFRNONAA >60 01/05/2024 0534   GFRNONAA >60 12/18/2023 1100   GFRAA >60 03/11/2019 2058   Lipase     Component Value Date/Time   LIPASE 25 12/30/2023  0402       Studies/Results: DG CHEST PORT 1 VIEW Result Date: 01/04/2024 CLINICAL DATA:  200808 Hypoxia [id number] EXAM: PORTABLE CHEST 1 VIEW COMPARISON:  December 30, 2023 FINDINGS: The cardiomediastinal silhouette is unchanged in contour. Similar irregular RIGHT perihilar contour compared to prior with associated biopsy clip. Similar biopsy clip in association with a LEFT superior lung mass. The enteric tube courses through the chest to the abdomen beyond the field-of-view. Atherosclerotic calcifications of the aorta. No pleural effusion. No pneumothorax. No acute pleuroparenchymal abnormality. Lytic lesion of the LEFT posterolateral third rib. IMPRESSION: 1. No acute cardiopulmonary abnormality. 2. Similar appearance of known pulmonary malignancy including a metastatic lesion of the LEFT posterior third rib. Electronically Signed   By: Corean Salter M.D.   On: 01/04/2024 17:53    Anti-infectives: Anti-infectives (From admission, onward)    Start     Dose/Rate Route Frequency Ordered Stop   01/04/24 0806  cefTRIAXone  (ROCEPHIN ) 1 g in sodium chloride  0.9 % 100 mL IVPB        1 g 200 mL/hr over 30 Minutes Intravenous Every 24 hours 01/04/24 0806          Assessment/Plan Small bowel obstruction/ileus likely secondary to cecal colitis of unclear etiology  -having  multiple BMs and passing flatus.  No nausea or vomiting with NGT clamped and CLD. -DC NGT today and give FLD -add glucerna shakes for protein -ambulate as able -wife at bedside  FEN - FLD/Glucerna (per patient request) VTE - heparin  ID - Rocephin , UTI  AKI - improved UTI -Rocephin  Non-small cell lung cancer, mets - on immunotherapy Prostate cancer - hx of radiation HTN  I reviewed nursing notes, hospitalist notes, last 24 h vitals and pain scores, last 48 h intake and output, last 24 h labs and trends, and last 24 h imaging results.   LOS: 7 days    Burnard FORBES Banter , [facility] 01/06/2024, 8:13  AM Please see Amion for pager number during day hours 7:00am-4:30pm or 7:00am -11:30am on weekends

## 2024-01-06 NOTE — Progress Notes (Signed)
 [facility]                                                                               Chad Moreno, is a 83 y.o. male, DOB - 05/06/1941, FMW:[id number] Admit date - 12/30/2023    Outpatient Primary MD for the patient is Moreno, [name], MD  LOS - 7  days    Brief summary   Chad Moreno is a 83 year old man medical history of metastatic small cell lung cancer s/p SRB [place] completed 10/2022 and currently onencorafenib and binimetinib  started on 06/2023, chronic thrombocytopenia, essential hypertension and GERD.  Presented and complaining of diffuse abdominal pain for 1 day, abdominal discomfort, distention, nausea and vomiting x 2.  Also reported several piece of dark and loose stool.  Concern for cecal colitis and bowel obstruction.   X-ray abdomen show gaseous distention of the small bowel measuring up to 4.2 cm compatible with small bowel obstruction.Lucency under the left hemidiaphragm is probably related to gaseous distention of the stomach . Free gas under the left hemidiaphragm is considered less likely but not excluded. This could also be further assessed at the time of CT.  CTA: CT abd/ Pelvis :  no PE, New wall thickening and inflammatory fat stranding about the cecum, consistent with nonspecific infectious or inflammatory colitis. 3. Mildly distended, fluid-filled loops of mid to distal small bowel throughout the abdomen, largest, distal loops measuring up to 4.1 cm in caliber. Diminished volume of a left pleural effusion   Unchanged sclerotic osseous metastases of the posterior right ninth rib and T9 vertebral body. Unchanged sclerotic osseous metastases of the left sacrum and ilium.  Hospitalist has been consulted for further evaluation management of small bowel obstruction, AKI and GI bleed. General surgery on board.    Assessment & Plan    Assessment and Plan:  Bowel obstruction suspect from cecal colitis of unclear etiology.  NGT removed and  diet is slowly being advanced.  Gen surgery on board and appreciate recommendations. A CT abd and pelvis was ordered for further evaluation. No evidence of bowel obstruction. Enteric contrast material reaches the rectum. Continue with conservative management .  GI on board.    Cecal colitis  Unclear etiology, discussed with Dr [name],. Its possible that colitis is from immunotherapy. Patient will follow up with Dr [name] on discharge.     Non-small cell lung cancer  Follows up with Dr [name].  on immunotherapy which is on hold.  Hypertension Supine BP are wnl, orthostatic vital signs abnormal.  Midodrine  added.    Acute kidney injury in the setting of vomiting and dehydration Creatinine has improved and creatinine appears to be back at baseline   Heme positive stools/gi bleed Drop in hemoglobin from 10.7 to 9 to 7.9 to 7.5 suspect from cecal colitis/ diverticulosis.  Empirically started the patient on IV protonix  40 mg BID. SABRA  1 unit of prbc transfusion ordered. Repeat hemoglobin is 9.5 .  Anemia panel shows iron  deficiency . IV iron  infusion ordered.  Continue to monitor.     Iron  deficiency anemia/mild thrombocytopenia Continue to monitor and iron  supplementation will be added on discharge.  Orthostatic hypotension Improved with IV  fluids.    Hypoxia Cxr is negative for acute disease.  Encourage  IS use.    ESBL UTI:  On macrobid .    Estimated body mass index is 28.48 kg/m as calculated from the following:   Height as of this encounter: 6' (1.829 m).   Weight as of this encounter: 95.3 kg.  Code Status: full code.  DVT Prophylaxis:  heparin  injection 5,000 Units Start: 01/01/24 0600 Place and maintain sequential compression device Start: 12/31/23 0733   Level of Care: Level of care: Med-Surg Family Communication: family at bedside.   Disposition Plan:     Remains inpatient appropriate:  pending clinical improvement.   Procedures:  None.    Consultants:   Gen surgery GI  Antimicrobials:   Anti-infectives (From admission, onward)    Start     Dose/Rate Route Frequency Ordered Stop   01/06/24 1100  nitrofurantoin  (macrocrystal-monohydrate) (MACROBID ) capsule 100 mg        100 mg Oral Every 12 hours 01/06/24 1003 01/11/24 0959   01/04/24 0806  cefTRIAXone  (ROCEPHIN ) 1 g in sodium chloride  0.9 % 100 mL IVPB  Status:  Discontinued        1 g 200 mL/hr over 30 Minutes Intravenous Every 24 hours 01/04/24 0806 01/06/24 1003        Medications  Scheduled Meds:  feeding supplement (GLUCERNA SHAKE)  237 mL Oral BID BM   Gerhardt's butt cream   Topical QID   heparin   5,000 Units Subcutaneous Q8H   midodrine   5 mg Oral BID WC   nitrofurantoin  (macrocrystal-monohydrate)  100 mg Oral Q12H   pantoprazole   40 mg Oral BID   Continuous Infusions:  lactated ringers      lactated ringers  75 mL/hr at 01/06/24 0639   promethazine (PHENERGAN) injection (IM or IVPB)     PRN Meds:.acetaminophen  **OR** acetaminophen , albuterol , hydrALAZINE , HYDROmorphone  (DILAUDID ) injection, LORazepam , methocarbamol  (ROBAXIN ) injection, ondansetron  **OR** ondansetron  (ZOFRAN ) IV, phenol, promethazine (PHENERGAN) injection (IM or IVPB), sodium chloride  flush    Subjective:   Chad Moreno was seen and examined today.  Abd distention . No nausea and vomiting.   Objective:   Vitals:   01/05/24 2351 01/06/24 0242 01/06/24 0533 01/06/24 1325  BP: 137/67 (!) 178/80 (!) 161/77 (!) 140/71  Pulse: 77 78 85 79  Resp: 14  14 18   Temp: 97.6 F (36.4 C) 97.6 F (36.4 C)  99.2 F (37.3 C)  TempSrc: Oral Oral  Oral  SpO2: 97% 97% 97% 92%  Weight:      Height:        Intake/Output Summary (Last 24 hours) at 01/06/2024 1634 Last data filed at 01/06/2024 1400 Gross per 24 hour  Intake 3574.13 ml  Output 200 ml  Net 3374.13 ml   Filed Weights   12/30/23 0315  Weight: 95.3 kg     Exam General exam: Appears calm and comfortable  Respiratory  system: Clear to auscultation. Respiratory effort normal. Cardiovascular system: S1 & S2 heard, RRR. No JVD,  Gastrointestinal system: Abdomen is nondistended, soft and nontender.  Central nervous system: Alert and oriented.  Extremities: no pedal edema.  Skin: No rashes, Psychiatry:  Mood & affect appropriate.        Data Reviewed:  I have personally reviewed following labs and imaging studies   CBC Lab Results  Component Value Date   WBC 5.1 01/06/2024   RBC 3.38 (L) 01/06/2024   HGB 9.5 (L) 01/06/2024   HCT 29.9 (L) 01/06/2024   MCV 88.5 01/06/2024  MCH 28.1 01/06/2024   PLT 112 (L) 01/06/2024   MCHC 31.8 01/06/2024   RDW 14.3 01/06/2024   LYMPHSABS 0.4 (L) 01/06/2024   MONOABS 0.7 01/06/2024   EOSABS 0.1 01/06/2024   BASOSABS 0.0 01/06/2024     Last metabolic panel Lab Results  Component Value Date   NA 139 01/05/2024   K 3.6 01/05/2024   CL 104 01/05/2024   CO2 27 01/05/2024   BUN 11 01/05/2024   CREATININE 0.71 01/05/2024   GLUCOSE 90 01/05/2024   GFRNONAA >60 01/05/2024   GFRAA >60 03/11/2019   CALCIUM 7.6 (L) 01/05/2024   PROT 7.3 12/30/2023   ALBUMIN 3.5 12/30/2023   BILITOT 1.0 12/30/2023   ALKPHOS 62 12/30/2023   AST 33 12/30/2023   ALT 18 12/30/2023   ANIONGAP 8 01/05/2024    CBG (last 3)  No results for input(s): GLUCAP in the last 72 hours.    Coagulation Profile: No results for input(s): INR, PROTIME in the last 168 hours.    Radiology Studies: DG CHEST PORT 1 VIEW Result Date: 01/04/2024 CLINICAL DATA:  200808 Hypoxia [id number] EXAM: PORTABLE CHEST 1 VIEW COMPARISON:  December 30, 2023 FINDINGS: The cardiomediastinal silhouette is unchanged in contour. Similar irregular RIGHT perihilar contour compared to prior with associated biopsy clip. Similar biopsy clip in association with a LEFT superior lung mass. The enteric tube courses through the chest to the abdomen beyond the field-of-view. Atherosclerotic calcifications of the aorta. No  pleural effusion. No pneumothorax. No acute pleuroparenchymal abnormality. Lytic lesion of the LEFT posterolateral third rib. IMPRESSION: 1. No acute cardiopulmonary abnormality. 2. Similar appearance of known pulmonary malignancy including a metastatic lesion of the LEFT posterior third rib. Electronically Signed   By: Corean Salter M.D.   On: 01/04/2024 17:53       Elgie Butter M.D. [facility] 01/06/2024, 4:34 PM  Available via Epic secure chat 7am-7pm After 7 pm, please refer to night coverage provider listed on amion.

## 2024-01-06 NOTE — Telephone Encounter (Signed)
 Auth Submission: NO AUTH NEEDED Site of care: Site of care: [facility] Payer: Medicare A/B with AARP supplement Medication & CPT/J Code(s) submitted: Feraheme (ferumoxytol) [id number] Diagnosis Code:  Route of submission (phone, fax, portal):  Phone # Fax # Auth type: Buy/Bill PB Units/visits requested: 510mg  x 1 dose Reference number:  Approval from: 01/06/24 to 05/08/24

## 2024-01-06 NOTE — Progress Notes (Signed)
 Inpatient Rehab Admissions Coordinator:   CIR consult received. Attempted to reach Pt and wife to discuss. Left Vms and await callback.   Leita Kleine, MS, CCC-SLP Rehab Admissions Coordinator  [phone] (celll) [phone] (office)

## 2024-01-06 NOTE — Progress Notes (Signed)
 Inpatient Rehab Admissions Coordinator:   Per thearapy recommendations,  patient was screened for CIR candidacy by Leita Kleine, MS, CCC-SLP. At this time, Pt. Appears to be a a potential candidate for CIR. I will place   order for rehab consult per protocol for full assessment. Please contact me any with questions.  Leita Kleine, MS, CCC-SLP Rehab Admissions Coordinator  [phone] (celll) [phone] (office)

## 2024-01-06 NOTE — Telephone Encounter (Signed)
 Patient referred to infusion pharmacy team for ambulatory infusion of IV iron .  Insurance - Medicare Site of care - Site of care: [facility] Dx code - D50.9 IV Iron  Therapy - Feraheme 510 mg x 1 Infusion appointments - Scheduling team will schedule patient as soon as possible.    Chad Moreno, PharmD Pharmacist [facility]

## 2024-01-06 NOTE — Plan of Care (Signed)

## 2024-01-06 NOTE — Plan of Care (Signed)

## 2024-01-07 ENCOUNTER — Other Ambulatory Visit (HOSPITAL_COMMUNITY): Payer: Self-pay

## 2024-01-07 DIAGNOSIS — C3492 Malignant neoplasm of unspecified part of left bronchus or lung: Secondary | ICD-10-CM | POA: Diagnosis not present

## 2024-01-07 DIAGNOSIS — C7951 Secondary malignant neoplasm of bone: Secondary | ICD-10-CM | POA: Diagnosis not present

## 2024-01-07 DIAGNOSIS — K56609 Unspecified intestinal obstruction, unspecified as to partial versus complete obstruction: Secondary | ICD-10-CM | POA: Diagnosis not present

## 2024-01-07 MED ORDER — MELATONIN 5 MG PO TABS
5.0000 mg | ORAL_TABLET | Freq: Once | ORAL | Status: AC
  Administered 2024-01-07: 5 mg via ORAL
  Filled 2024-01-07: qty 1

## 2024-01-07 NOTE — PMR Pre-admission (Shared)
 PMR Admission Coordinator Pre-Admission Assessment  Patient: Chad Moreno is an 83 y.o., male MRN: [id number] DOB: Jun 01, 1941 Height: 6' (182.9 cm) Weight: 95.3 kg  Insurance Information HMO:     PPO:      PCP:      IPA:      80/20: yes     OTHER:  PRIMARY: Medicare a and b      Policy#: [id number]      Subscriber: pt Benefits:  Phone #: passport one source     Name: 6/29 Eff. Date: 02/23/2006    Deduct: $1676      Out of Pocket Max: none      Life Max: none CIR: 100%      SNF: 20 dull days Outpatient: 80%     Co-Pay: 20% Home Health: 100%      Co-Pay:  DME: 80%     Co-Pay: 20% Providers: pt choice  SECONDARY: aarp      Policy#:    [id number]   Phone#:   Financial Counselor:       Phone#:   The "Data Collection Information Summary" for patients in Inpatient Rehabilitation Facilities with attached "Privacy Act Statement-Health Care Records" was provided and verbally reviewed with: Patient  Emergency Contact Information Contact Information     Name Relation Home Work Mobile   HAYDIN, CALANDRA [phone]      Other Contacts   None on File     Current Medical History  Patient Admitting Diagnosis: Bowel Obstruction History of Present Illness: Chad Moreno is a 83 y.o. male with medical history significant for recurrent/metastatic non-small cell lung cancer on immunotherapy, hypertension, hyperlipidemia being admitted to the hospital with 24 hours of vomiting and concern for cecal colitis and bowel obstruction. He presented to [facility] ED 12/30/23.Presented and complaining of diffuse abdominal pain for 1 day, abdominal discomfort, distention, nausea and vomiting x 2.  Also reported several piece of dark and loose stool. Concern for cecal colitis and bowel obstruction. X-ray abdomen show gaseous distention of the small bowel measuring up to 4.2 cm compatible with small bowel obstruction.Lucency under the left hemidiaphragm is probably related  to gaseous distention of the stomach . Free gas under the left hemidiaphragm is considered less likely but not excluded. This could also be further assessed at the time of CT. Pt. Placed on NGT to suction, managed conservatively. PT/OT recommended CIR to assist return to PLOF.     Patient's medical record from [facility]  has been reviewed by the rehabilitation admission coordinator and physician.  Past Medical History  Past Medical History:  Diagnosis Date   Age-related cataract of left eye    Amblyopia, right eye    Arthritis    Cancer (HCC)    Dysuria    ED (erectile dysfunction)    Elevated PSA    Epidermal cyst    Family history of prostate cancer    Gout    Hip pain    Histoplasmosis    Hyperlipidemia    Hypermetropia, left eye    Hypertension    Impaired glucose tolerance    Joint pain    Left shoulder pain    Lower back pain    Melanocytic nevi, unspecified    Nummular dermatitis    Ocular hypertension    Palmar fascial fibromatosis    Prostate cancer screening    Regular astigmatism, right eye    Senile nuclear sclerosis     Has the patient  had major surgery during 100 days prior to admission? Yes  Family History   family history includes Prostate cancer in his father.  Current Medications  Current Facility-Administered Medications:    acetaminophen  (TYLENOL ) tablet 650 mg, 650 mg, Oral, Q6H PRN, 650 mg at 01/06/24 2016 **OR** acetaminophen  (TYLENOL ) suppository 650 mg, 650 mg, Rectal, Q6H PRN, Zella, Mir M, MD   albuterol  (PROVENTIL ) (2.5 MG/3ML) 0.083% nebulizer solution 2.5 mg, 2.5 mg, Nebulization, Q2H PRN, Zella, Mir M, MD   feeding supplement (GLUCERNA SHAKE) (GLUCERNA SHAKE) liquid 237 mL, 237 mL, Oral, BID BM, Tammy Sor, PA-C, 237 mL at 01/07/24 1224   Gerhardt's butt cream, , Topical, QID, Akula, Vijaya, MD, Given at 01/07/24 0808   heparin  injection 5,000 Units, 5,000 Units, Subcutaneous, Q8H, Shahmehdi, Seyed A, MD, 5,000  Units at 01/07/24 1225   hydrALAZINE  (APRESOLINE ) injection 5 mg, 5 mg, Intravenous, Q6H PRN, Zella, Mir M, MD, 5 mg at 01/07/24 9345   HYDROmorphone  (DILAUDID ) injection 1 mg, 1 mg, Intravenous, Q2H PRN, Vernetta Berg, MD, 1 mg at 01/06/24 0556   lactated ringers  bolus 1,000 mL, 1,000 mL, Intravenous, Once, Zella, Mir M, MD   LORazepam  (ATIVAN ) injection 1 mg, 1 mg, Intravenous, Q6H PRN, Zella, Mir M, MD, 1 mg at 12/30/23 1031   methocarbamol  (ROBAXIN ) injection 500 mg, 500 mg, Intravenous, Q6H PRN, Bent, Oshane, PA-C, 500 mg at 01/04/24 0315   nitrofurantoin  (macrocrystal-monohydrate) (MACROBID ) capsule 100 mg, 100 mg, Oral, Q12H, Akula, Vijaya, MD, 100 mg at 01/07/24 0807   ondansetron  (ZOFRAN ) tablet 4 mg, 4 mg, Oral, Q6H PRN **OR** ondansetron  (ZOFRAN ) injection 4 mg, 4 mg, Intravenous, Q6H PRN, Zella, Mir M, MD, 4 mg at 01/04/24 2146   pantoprazole  (PROTONIX ) EC tablet 40 mg, 40 mg, Oral, BID, Mark Bard LABOR, RPH, 40 mg at 01/07/24 0807   phenol (CHLORASEPTIC) mouth spray 1 spray, 1 spray, Mouth/Throat, PRN, Zella, Mir M, MD   promethazine (PHENERGAN) 12.5 mg in sodium chloride  0.9 % 50 mL IVPB, 12.5 mg, Intravenous, Q6H PRN, Zella, Mir M, MD   sodium chloride  flush (NS) 0.9 % injection 10-40 mL, 10-40 mL, Intracatheter, PRN, Akula, Vijaya, MD  Patients Current Diet:  Diet Order             DIET SOFT Room service appropriate? Yes; Fluid consistency: Thin  Diet effective now                   Precautions / Restrictions Precautions Precautions: Fall Precaution/Restrictions Comments: orthostatic Restrictions Weight Bearing Restrictions Per Provider Order: No   Has the patient had 2 or more falls or a fall with injury in the past year? Yes  Prior Activity Level Community (5-7x/wk): active in the community PTA  Prior Functional Level Self Care: Did the patient need help bathing, dressing, using the toilet or eating? Independent  Indoor  Mobility: Did the patient need assistance with walking from room to room (with or without device)? Independent  Stairs: Did the patient need assistance with internal or external stairs (with or without device)? Independent  Functional Cognition: Did the patient need help planning regular tasks such as shopping or remembering to take medications? Independent  Patient Information Are you of Hispanic, Latino/a,or Spanish origin?: A. No, not of Hispanic, Latino/a, or Spanish origin What is your race?: A. White Do you need or want an interpreter to communicate with a doctor or health care staff?: 0. No  Patient's Response To:  Health Literacy and Transportation Is the patient able to  respond to health literacy and transportation needs?: Yes Health Literacy - How often do you need to have someone help you when you read instructions, pamphlets, or other written material from your doctor or pharmacy?: Never In the past 12 months, has lack of transportation kept you from medical appointments or from getting medications?: No In the past 12 months, has lack of transportation kept you from meetings, work, or from getting things needed for daily living?: No  Home Assistive Devices / Equipment Home Equipment: None  Prior Device Use: Indicate devices/aids used by the patient prior to current illness, exacerbation or injury? None of the above  Current Functional Level Cognition       Extremity Assessment (includes Sensation/Coordination)  Upper Extremity Assessment: Generalized weakness (provided yellow theraband and instructed in BUE exercise to increase strength)  Lower Extremity Assessment: Generalized weakness    ADLs  Overall ADL's : Needs assistance/impaired Grooming: Wash/dry hands, Wash/dry face, Bed level, Set up Upper Body Bathing: Minimal assistance, Bed level Lower Body Bathing: Bed level, Maximal assistance Upper Body Dressing : Minimal assistance, Bed level Lower Body Dressing:  Maximal assistance, Bed level General ADL Comments: OT session focused on BUE exercise.  Pt sat in chair earlier!  Encouraged pt to sit EOB with OT but he did decline as he hadnt beebn back in bed too long. Pt in good spirits and seemed to be feeling better! Pt had done BUE exercise as instructed by OT yesterday.  OT added yellow therand to pts instructions.    Mobility  Overal bed mobility: Needs Assistance Bed Mobility: Supine to Sit Supine to sit: Mod assist, Max assist Sit to supine: Max assist, +2 for physical assistance General bed mobility comments: assist for upper body and increased effort to scoot to EOB.  Mild c/o dizziness which subsided after 4 min.  No c/o pain.  Sharring his Hx about his Lung CA how it all started with Prostae CA.    Transfers  Overall transfer level: Needs assistance Equipment used: Rolling walker (2 wheels) Transfers: Sit to/from Stand Sit to Stand: Min assist, +2 safety/equipment General transfer comment: from elevated bed and + 2 asisst for safety.  Also taking Orthostatic vitals.  Mod c/o dizziness with each position change.  Also assisted with a toilet transfer due to weakness/unsteadiness.    Ambulation / Gait / Stairs / Wheelchair Mobility  Ambulation/Gait Ambulation/Gait assistance: Min assist, Mod assist Gait Distance (Feet): 12 Feet Assistive device: Rolling walker (2 wheels) Gait Pattern/deviations: Step-to pattern General Gait Details: Due to bowel urgency, assisted with amb to bathroom rather quickly as Pt insisted he use toilet vs BSC even with drop in BP and dizziness.    Posture / Balance Balance Overall balance assessment: Needs assistance Standing balance support: Bilateral upper extremity supported, Reliant on assistive device for balance, During functional activity Standing balance comment: reliant on UE support at this time    Special needs/care consideration Skin ***   Previous Home Environment (from acute therapy  documentation) Living Arrangements: Spouse/significant other  Lives With: Spouse, Significant other Available Help at Discharge: Family Type of Home: House Home Layout: Two level Alternate Level Stairs-Number of Steps: flight Home Access: Stairs to enter Entrance Stairs-Rails: Right Entrance Stairs-Number of Steps: 5 Bathroom Shower/Tub: Engineer, manufacturing systems: Standard Home Care Services: No  Discharge Living Setting Plans for Discharge Living Setting: Patient's home Type of Home at Discharge: House Discharge Home Layout: Two level Alternate Level Stairs-Number of Steps: flight Discharge Bathroom Shower/Tub: Tub/shower unit  Discharge Bathroom Toilet: Standard Discharge Bathroom Accessibility: Yes How Accessible: Accessible via walker Does the patient have any problems obtaining your medications?: No  Social/Family/Support Systems Patient Roles: Spouse Contact Information: [phone] Anticipated Caregiver: deborah Ability/Limitations of Caregiver: 24/7 [facility] Caregiver Availability: 24/7 Discharge Plan Discussed with Primary Caregiver: Yes Is Caregiver In Agreement with Plan?: Yes Does Caregiver/Family have Issues with Lodging/Transportation while Pt is in Rehab?: No  Goals Patient/Family Goal for Rehab: PT/OT Supervision Expected length of stay: 7-10 days Pt/Family Agrees to Admission and willing to participate: Yes Program Orientation Provided & Reviewed with Pt/Caregiver Including Roles  & Responsibilities: Yes  Decrease burden of Care through IP rehab admission: not anticipated  Possible need for SNF placement upon discharge: not anticipated  Patient Condition: I have reviewed medical records from [facility], spoken with CM, and patient and spouse. I met with patient at the bedside and discussed via phone for inpatient rehabilitation assessment.  Patient will benefit from ongoing PT and OT, can actively participate in 3 hours of therapy a day 5 days  of the week, and can make measurable gains during the admission.  Patient will also benefit from the coordinated team approach during an Inpatient Acute Rehabilitation admission.  The patient will receive intensive therapy as well as Rehabilitation physician, nursing, social worker, and care management interventions.  Due to safety, skin/wound care, disease management, medication administration, pain management, and patient education the patient requires 24 hour a day rehabilitation nursing.  The patient is currently *** with mobility and basic ADLs.  Discharge setting and therapy post discharge at skilled nursing facility is anticipated.  Patient has agreed to participate in the Acute Inpatient Rehabilitation Program and will admit today.  Preadmission Screen Completed By:  Leita KATHEE Kleine, 01/07/2024 2:11 PM ______________________________________________________________________   Discussed status with Dr. PIERRETTE on *** at *** and received approval for admission today.  Admission Coordinator:  Leita KATHEE Kleine, CCC-SLP, time PIERRETTEPattricia ***   Assessment/Plan: Diagnosis: *** Does the need for close, 24 hr/day Medical supervision in concert with the patient's rehab needs make it unreasonable for this patient to be served in a less intensive setting? {yes_no_potentially:3041433} Co-Morbidities requiring supervision/potential complications: *** Due to {due un:6958565}, does the patient require 24 hr/day rehab nursing? {yes_no_potentially:3041433} Does the patient require coordinated care of a physician, rehab nurse, PT, OT, and SLP to address physical and functional deficits in the context of the above medical diagnosis(es)? {yes_no_potentially:3041433} Addressing deficits in the following areas: {deficits:3041436} Can the patient actively participate in an intensive therapy program of at least 3 hrs of therapy 5 days a week? {yes_no_potentially:3041433} The potential for patient to make measurable gains while on  inpatient rehab is {potential:3041437} Anticipated functional outcomes upon discharge from inpatient rehab: {functional outcomes:304600100} PT, {functional outcomes:304600100} OT, {functional outcomes:304600100} SLP Estimated rehab length of stay to reach the above functional goals is: *** Anticipated discharge destination: {anticipated dc setting:21604} 10. Overall Rehab/Functional Prognosis: {potential:3041437}   MD Signature: ***

## 2024-01-07 NOTE — Progress Notes (Signed)
 Occupational Therapy Treatment Patient Details Name: Chad Moreno MRN: [id number] DOB: 1941-04-22 Today's Date: 01/07/2024   History of present illness 83 year old man admitted on 12/30/23 with bowel obstruction suspect from cecal colitis of unclear etiology.  Past medical history of metastatic small cell lung cancer s/p SRB [place] completed 10/2022 and currently onencorafenib and binimetinib  started on 06/2023, chronic thrombocytopenia, essential hypertension and GERD.   OT comments  The pt presented with good participation in the session. He required min assist for supine to sit. He did report dizziness after sitting edge of bed, however OT was unable to take vitals, as the pt expressed an urgent need to use the bathroom. Given dizziness, toileting at bedside commode level would have been ideal, however the pt indicated a desire to ambulate to the bathroom, in order to perform toileting. He further required min assist for a toilet transfer and mod assist for toileting management. He did report feeling as though his dizziness has improved, as compared to previously, as he indicated the lightheadedness has resolved and he is no longer seeing stars. The pt expressed a desire to return home with home therapy at discharge. Should he proceed with this plan, he will need consistent supervision and assist from family, should his dizziness and potential issues with blood pressure persist. Continue OT plan of care.      If plan is discharge home, recommend the following:  A little help with walking and/or transfers;A little help with bathing/dressing/bathroom;Assistance with cooking/housework;Help with stairs or ramp for entrance   Equipment Recommendations  Tub/shower seat    Recommendations for Other Services      Precautions / Restrictions Precautions Precautions: Fall Restrictions Weight Bearing Restrictions Per Provider Order: No Other Position/Activity Restrictions: monitor blood pressure.        Mobility Bed Mobility Overal bed mobility: Needs Assistance Bed Mobility: Supine to Sit, Sit to Supine     Supine to sit: Min assist, HOB elevated, Used rails Sit to supine: Min assist        Transfers Overall transfer level: Needs assistance Equipment used: Rolling walker (2 wheels) Transfers: Sit to/from Stand Sit to Stand: From elevated surface, Min assist                 Balance     Sitting balance-Leahy Scale: Good         Standing balance comment: CGA with RW        ADL either performed or assessed with clinical judgement   ADL Overall ADL's : Needs assistance/impaired          Toilet Transfer: Minimal assistance;Grab bars;Ambulation Toilet Transfer Details (indicate cue type and reason): Pt ambulated to and from the bathroom in his room using a RW. He required light cueing for walker placement and use of grab bar during transfer. Toileting- Clothing Manipulation and Hygiene: Moderate assistance;Sit to/from stand;Cueing for safety Toileting - Clothing Manipulation Details (indicate cue type and reason): The pt elected to perform toileting tasks at bathroom level, rather than using the bedside commode. He subsequently required intermittent CGA in standing, min assist for clothing management, and assist for posterior hygiene after having a bowel movement, with the pt stating, you will need to help me, when referring to performing hygiene.             Vision Baseline Vision/History: 1 Wears glasses           Communication Communication Communication: No apparent difficulties   Cognition Arousal: Alert Behavior During Therapy: [facility]  for tasks assessed/performed Cognition: No apparent impairments             OT - Cognition Comments: Able to follow commands consistently. Required occasional cues for safety awareness                 Following commands: Intact                      Pertinent Vitals/ Pain       Pain  Assessment Pain Assessment: No/denies pain   Frequency  Min 2X/week        Progress Toward Goals  OT Goals(current goals can now be found in the care plan section)  Progress towards OT goals: Progressing toward goals  Acute Rehab OT Goals Patient Stated Goal: to return to normal activities and to return home at discharge OT Goal Formulation: With patient Time For Goal Achievement: 01/18/24 Potential to Achieve Goals: Good  Plan         AM-PAC OT 6 Clicks Daily Activity     Outcome Measure   Help from another person eating meals?: None Help from another person taking care of personal grooming?: A Little Help from another person toileting, which includes using toliet, bedpan, or urinal?: A Lot Help from another person bathing (including washing, rinsing, drying)?: A Lot Help from another person to put on and taking off regular upper body clothing?: A Little Help from another person to put on and taking off regular lower body clothing?: A Little 6 Click Score: 17    End of Session Equipment Utilized During Treatment: Rolling walker (2 wheels)  OT Visit Diagnosis: Unsteadiness on feet (R26.81);Muscle weakness (generalized) (M62.81);Dizziness and giddiness (R42)   Activity Tolerance Patient tolerated treatment well   Patient Left in bed;with call bell/phone within reach;with bed alarm set   Nurse Communication Mobility status        Time: 8368-8347 OT Time Calculation (min): 21 min  Charges: OT General Charges $OT Visit: 1 Visit OT Treatments $Self Care/Home Management : 8-22 mins     Delanna LITTIE Molt, OTR/L 01/07/2024, 5:25 PM

## 2024-01-07 NOTE — Progress Notes (Signed)
 Subjective: Up in chair this am.  No complaints.  Tolerating FLD with no issues as well as his Glucerna.  Still moving his bowels.  ROS: See above, otherwise other systems negative  Objective: Vital signs in last 24 hours: Temp:  [98.5 F (36.9 C)-99.2 F (37.3 C)] 98.5 F (36.9 C) (07/15 9386) Pulse Rate:  [75-81] 81 (07/15 0651) Resp:  [18] 18 (07/15 0613) BP: (140-179)/(71-88) 175/87 (07/15 0651) SpO2:  [92 %-96 %] 95 % (07/15 0613) Last BM Date : 01/06/24  Intake/Output from previous day: 07/14 0701 - 07/15 0700 In: 1904.3 [P.O.:1100; I.V.:804.3] Out: -  Intake/Output this shift: No intake/output data recorded.  PE: Gen: NAD Lungs: effort nonlabored Abd: soft, NT, ND  Lab Results:  Recent Labs    01/05/24 0534 01/06/24 1206  WBC 4.3 5.1  HGB 7.5* 9.5*  HCT 23.5* 29.9*  PLT 115* 112*   BMET Recent Labs    01/05/24 0534  NA 139  K 3.6  CL 104  CO2 27  GLUCOSE 90  BUN 11  CREATININE 0.71  CALCIUM 7.6*   PT/INR No results for input(s): LABPROT, INR in the last 72 hours.  CMP     Component Value Date/Time   NA 139 01/05/2024 0534   K 3.6 01/05/2024 0534   CL 104 01/05/2024 0534   CO2 27 01/05/2024 0534   GLUCOSE 90 01/05/2024 0534   BUN 11 01/05/2024 0534   CREATININE 0.71 01/05/2024 0534   CREATININE 1.08 12/18/2023 1100   CALCIUM 7.6 (L) 01/05/2024 0534   PROT 7.3 12/30/2023 0534   ALBUMIN 3.5 12/30/2023 0534   AST 33 12/30/2023 0534   AST 15 12/18/2023 1100   ALT 18 12/30/2023 0534   ALT 12 12/18/2023 1100   ALKPHOS 62 12/30/2023 0534   BILITOT 1.0 12/30/2023 0534   BILITOT 0.3 12/18/2023 1100   GFRNONAA >60 01/05/2024 0534   GFRNONAA >60 12/18/2023 1100   GFRAA >60 03/11/2019 2058   Lipase     Component Value Date/Time   LIPASE 25 12/30/2023 0402       Studies/Results: No results found.   Anti-infectives: Anti-infectives (From admission, onward)    Start     Dose/Rate Route Frequency Ordered Stop    01/06/24 1100  nitrofurantoin  (macrocrystal-monohydrate) (MACROBID ) capsule 100 mg        100 mg Oral Every 12 hours 01/06/24 1003 01/11/24 0959   01/04/24 0806  cefTRIAXone  (ROCEPHIN ) 1 g in sodium chloride  0.9 % 100 mL IVPB  Status:  Discontinued        1 g 200 mL/hr over 30 Minutes Intravenous Every 24 hours 01/04/24 0806 01/06/24 1003        Assessment/Plan Small bowel obstruction/ileus likely secondary to cecal colitis of unclear etiology  -tolerating FLD with  no pain and still moving his bowels. -adv to soft diet today -no needs for surgical intervention -glucerna shakes for protein -ambulate as able -wife at bedside -d/w primary service.  We will sign off, but are available if needed.  FEN - soft/Glucerna VTE - heparin  ID - Rocephin , UTI  AKI - improved UTI -Rocephin  Non-small cell lung cancer, mets - on immunotherapy Prostate cancer - hx of radiation HTN  I reviewed nursing notes, hospitalist notes, last 24 h vitals and pain scores, last 48 h intake and output, last 24 h labs and trends, and last 24 h imaging results.   LOS: 8 days    Burnard FORBES Banter , PA-C  [facility] 01/07/2024, 8:25 AM Please see Amion for pager number during day hours 7:00am-4:30pm or 7:00am -11:30am on weekends

## 2024-01-07 NOTE — Progress Notes (Signed)
 [facility]                                                                               Chad Moreno, is a 83 y.o. male, DOB - [birth year], FMW:[id number] Admit date - 12/30/2023    Outpatient Primary MD for the patient is [name], [name], MD  LOS - 8  days    Brief summary   Chad Moreno is a 83 year old man medical history of metastatic small cell lung cancer s/p SRB Teague completed 10/2022 and currently onencorafenib and binimetinib  started on 06/2023, chronic thrombocytopenia, essential hypertension and GERD.  Presented and complaining of diffuse abdominal pain for 1 day, abdominal discomfort, distention, nausea and vomiting x 2.  Also reported several piece of dark and loose stool.  Concern for cecal colitis and bowel obstruction.   X-ray abdomen show gaseous distention of the small bowel measuring up to 4.2 cm compatible with small bowel obstruction.Lucency under the left hemidiaphragm is probably related to gaseous distention of the stomach . Free gas under the left hemidiaphragm is considered less likely but not excluded. This could also be further assessed at the time of CT.  CTA: CT abd/ Pelvis :  no PE, New wall thickening and inflammatory fat stranding about the cecum, consistent with nonspecific infectious or inflammatory colitis. 3. Mildly distended, fluid-filled loops of mid to distal small bowel throughout the abdomen, largest, distal loops measuring up to 4.1 cm in caliber. Diminished volume of a left pleural effusion   Unchanged sclerotic osseous metastases of the posterior right ninth rib and T9 vertebral body. Unchanged sclerotic osseous metastases of the left sacrum and ilium.  Hospitalist has been consulted for further evaluation management of small bowel obstruction, AKI and GI bleed. General surgery on board.    Assessment & Plan    Assessment and Plan:  Bowel obstruction suspect from cecal colitis of unclear etiology.  NGT removed and  diet is slowly being advanced.  Gen surgery on board and appreciate recommendations. A CT abd and pelvis was ordered for further evaluation. No evidence of bowel obstruction. Enteric contrast material reaches the rectum. Continue with conservative management .  He is able to tolerate diet without any issues.  Ambulate encouraged.    Cecal colitis  Unclear etiology, discussed with Dr [name],. Its possible that colitis is from immunotherapy. Patient will follow up with Dr [name] on discharge.     Non-small cell lung cancer  Follows up with Dr [name].  on immunotherapy which is on hold.  Hypertension Elevated supine BP parameters.  Will d/c midodrine .    Acute kidney injury in the setting of vomiting and dehydration Creatinine has improved and creatinine appears to be back at baseline   Heme positive stools/gi bleed Drop in hemoglobin from 10.7 to 9 to 7.9 to 7.5 suspect from cecal colitis/ diverticulosis.  Empirically started the patient on IV protonix  40 mg BID. SABRA  1 unit of prbc transfusion ordered. Repeat hemoglobin is 9.5 .  Anemia panel shows iron  deficiency . IV iron  infusion ordered.  Continue to monitor.  Recommend outpatient follow up with GI for possible colonoscopy and EGD.     Iron  deficiency anemia/mild  thrombocytopenia Continue to monitor and iron  supplementation will be added on discharge.  Orthostatic hypotension Improved with IV fluids.  Repeat orthostatics today.    Hypoxia Cxr is negative for acute disease.  Encourage  IS use.    ESBL UTI:  On macrobid  to complete the course.    Estimated body mass index is 28.48 kg/m as calculated from the following:   Height as of this encounter: 6' (1.829 m).   Weight as of this encounter: 95.3 kg.  Code Status: full code.  DVT Prophylaxis:  heparin  injection 5,000 Units Start: 01/01/24 0600 Place and maintain sequential compression device Start: 12/31/23 0733   Level of Care: Level of care:  Med-Surg Family Communication: family at bedside.   Disposition Plan:     Remains inpatient appropriate:  pending clinical improvement.   Procedures:  None.   Consultants:   Gen surgery GI  Antimicrobials:   Anti-infectives (From admission, onward)    Start     Dose/Rate Route Frequency Ordered Stop   01/06/24 1100  nitrofurantoin  (macrocrystal-monohydrate) (MACROBID ) capsule 100 mg        100 mg Oral Every 12 hours 01/06/24 1003 01/11/24 0959   01/04/24 0806  cefTRIAXone  (ROCEPHIN ) 1 g in sodium chloride  0.9 % 100 mL IVPB  Status:  Discontinued        1 g 200 mL/hr over 30 Minutes Intravenous Every 24 hours 01/04/24 0806 01/06/24 1003        Medications  Scheduled Meds:  feeding supplement (GLUCERNA SHAKE)  237 mL Oral BID BM   Gerhardt's butt cream   Topical QID   heparin   5,000 Units Subcutaneous Q8H   midodrine   5 mg Oral BID WC   nitrofurantoin  (macrocrystal-monohydrate)  100 mg Oral Q12H   pantoprazole   40 mg Oral BID   Continuous Infusions:  lactated ringers      promethazine (PHENERGAN) injection (IM or IVPB)     PRN Meds:.acetaminophen  **OR** acetaminophen , albuterol , hydrALAZINE , HYDROmorphone  (DILAUDID ) injection, LORazepam , methocarbamol  (ROBAXIN ) injection, ondansetron  **OR** ondansetron  (ZOFRAN ) IV, phenol, promethazine (PHENERGAN) injection (IM or IVPB), sodium chloride  flush    Subjective:   Chad Moreno was seen and examined today.  No nausea, vomiting. Intermittent abd pain , but improving.   Objective:   Vitals:   01/06/24 2127 01/07/24 0613 01/07/24 0651 01/07/24 0949  BP: (!) 163/82 (!) 179/88 (!) 175/87 (!) 170/77  Pulse: 75 76 81 78  Resp: 18 18    Temp: 99 F (37.2 C) 98.5 F (36.9 C)    TempSrc:      SpO2: 96% 95%    Weight:      Height:        Intake/Output Summary (Last 24 hours) at 01/07/2024 1140 Last data filed at 01/07/2024 1000 Gross per 24 hour  Intake 2264.27 ml  Output 300 ml  Net 1964.27 ml   Filed Weights    12/30/23 0315  Weight: 95.3 kg     Exam General exam: Appears calm and comfortable  Respiratory system: Clear to auscultation. Respiratory effort normal. Cardiovascular system: S1 & S2 heard, RRR. No JVD,  Gastrointestinal system: Abdomen is nondistended, soft and nontender.  Central nervous system: Alert and oriented. No focal neurological deficits. Extremities: Symmetric 5 x 5 power. Skin: No rashes, Psychiatry:  Mood & affect appropriate.         Data Reviewed:  I have personally reviewed following labs and imaging studies   CBC Lab Results  Component Value Date   WBC 5.1 01/06/2024  RBC 3.38 (L) 01/06/2024   HGB 9.5 (L) 01/06/2024   HCT 29.9 (L) 01/06/2024   MCV 88.5 01/06/2024   MCH 28.1 01/06/2024   PLT 112 (L) 01/06/2024   MCHC 31.8 01/06/2024   RDW 14.3 01/06/2024   LYMPHSABS 0.4 (L) 01/06/2024   MONOABS 0.7 01/06/2024   EOSABS 0.1 01/06/2024   BASOSABS 0.0 01/06/2024     Last metabolic panel Lab Results  Component Value Date   NA 139 01/05/2024   K 3.6 01/05/2024   CL 104 01/05/2024   CO2 27 01/05/2024   BUN 11 01/05/2024   CREATININE 0.71 01/05/2024   GLUCOSE 90 01/05/2024   GFRNONAA >60 01/05/2024   GFRAA >60 03/11/2019   CALCIUM 7.6 (L) 01/05/2024   PROT 7.3 12/30/2023   ALBUMIN 3.5 12/30/2023   BILITOT 1.0 12/30/2023   ALKPHOS 62 12/30/2023   AST 33 12/30/2023   ALT 18 12/30/2023   ANIONGAP 8 01/05/2024    CBG (last 3)  No results for input(s): GLUCAP in the last 72 hours.    Coagulation Profile: No results for input(s): INR, PROTIME in the last 168 hours.    Radiology Studies: No results found.      Elgie Butter M.D. [facility] 01/07/2024, 11:40 AM  Available via Epic secure chat 7am-7pm After 7 pm, please refer to night coverage provider listed on amion.

## 2024-01-07 NOTE — Progress Notes (Signed)
 [facility]ology Progress Note  Chad Moreno 83 y.o. [birth year]   Subjective: Sitting in bedside chair.  Denies abdominal pain.  Reports loose stools.  Reports ambulation in halls today.  Objective: Vital signs: Vitals:   01/07/24 0651 01/07/24 0949  BP: (!) 175/87 (!) 170/77  Pulse: 81 78  Resp:    Temp:    SpO2:    T 98.5, R 18  Physical Exam: Gen: Elderly, alert, no acute distress, well-nourished, pleasant HEENT: anicteric sclera CV: RRR Chest: CTA B Abd: Soft nontender nondistended positive bowel sounds Ext: no edema  Lab Results: Recent Labs    01/05/24 0534  NA 139  K 3.6  CL 104  CO2 27  GLUCOSE 90  BUN 11  CREATININE 0.71  CALCIUM 7.6*   No results for input(s): AST, ALT, ALKPHOS, BILITOT, PROT, ALBUMIN in the last 72 hours. Recent Labs    01/05/24 0534 01/06/24 1206  WBC 4.3 5.1  NEUTROABS 3.2 3.6  HGB 7.5* 9.5*  HCT 23.5* 29.9*  MCV 89.0 88.5  PLT 115* 112*      Assessment/Plan: Resolving ileus-cecal colitis of unclear source.  Tolerating soft diet.  [facility] will sign off.  Call if questions.   Chad Moreno 01/07/2024, 1:49 PM  Questions please call [phone] ID: Chad Moreno, male   DOB: 1941/01/31, 83 y.o.   MRN: [id number]

## 2024-01-07 NOTE — Progress Notes (Addendum)
 Physical Therapy Treatment Patient Details Name: Chad Moreno MRN: [id number] DOB: [birth year] Today's Date: 01/07/2024   History of Present Illness 83 year old man admitted on 12/30/23 with Bowel obstruction suspect from cecal colitis of unclear etiology.  Past medical history of metastatic small cell lung cancer s/p SRB Teague completed 10/2022 and currently onencorafenib and binimetinib  started on 06/2023, chronic thrombocytopenia, essential hypertension and GERD.    PT Comments  LOS 8 days AxO x 3 very pleasant and willing.  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 Assisted to EOB was difficult. General bed mobility comments: assist for upper body and increased effort to scoot to EOB.  Mild c/o dizziness which subsided after 4 min.  No c/o pain.  Sharring his Hx about his Lung CA how it all started with Prostae CA. General transfer comment: from elevated bed and + 2 asisst for safety.  Also taking Orthostatic vitals.  Mod c/o dizziness with each position change.  Also assisted with a toilet transfer due to weakness/unsteadiness. General Gait Details: Due to bowel urgency, assisted with amb to bathroom rather quickly as Pt insisted he use toilet vs BSC even with drop in BP and dizziness. Unable to attempt amb in hallway due to positive Orthostatic BP's. Supine    BP 165/80  HR 83 EOB       BP 138/87  HR 88 Standing BP 106/60  HR 95   MAX c/o dizziness Positioned in recliner B LE elevated and lunch tray set up.  Pt requesting, thinking he will be able to amb in hallway later.  Notified RN of vitals and also recorded in EPIC. LPT has rec [facility] PT but per chart review CIR is reviewing his case.   If plan is discharge home, recommend the following: A little help with walking and/or transfers;A little help with bathing/dressing/bathroom;Assistance with cooking/housework;Help with stairs or ramp for entrance   Can travel by  private vehicle        Equipment Recommendations  Rolling walker (2 wheels)    Recommendations for Other Services       Precautions / Restrictions Precautions Precautions: Fall Precaution/Restrictions Comments: orthostatic Restrictions Weight Bearing Restrictions Per Provider Order: No     Mobility  Bed Mobility Overal bed mobility: Needs Assistance Bed Mobility: Supine to Sit     Supine to sit: Mod assist, Max assist     General bed mobility comments: assist for upper body and increased effort to scoot to EOB.  Mild c/o dizziness which subsided after 4 min.  No c/o pain.  Sharring his Hx about his Lung CA how it all started with Prostae CA.    Transfers Overall transfer level: Needs assistance Equipment used: Rolling walker (2 wheels) Transfers: Sit to/from Stand Sit to Stand: Min assist, +2 safety/equipment           General transfer comment: from elevated bed and + 2 asisst for safety.  Also taking Orthostatic vitals.  Mod c/o dizziness with each position change.  Also assisted with a toilet transfer due to weakness/unsteadiness.    Ambulation/Gait Ambulation/Gait assistance: Min assist, Mod assist Gait Distance (Feet): 12 Feet Assistive device: Rolling walker (2 wheels) Gait Pattern/deviations: Step-to pattern       General Gait Details: Due to bowel urgency, assisted with amb to bathroom rather quickly as Pt insisted he use toilet vs BSC even with drop in BP and dizziness.   Stairs  Wheelchair Mobility     Tilt Bed    Modified Rankin (Stroke Patients Only)       Balance                                            Communication Communication Communication: No apparent difficulties  Cognition Arousal: Alert Behavior During Therapy: WFL for tasks assessed/performed   PT - Cognitive impairments: No apparent impairments                       PT - Cognition Comments: AxO x 3 very pleasant and  willing.  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 Following commands: Intact      Cueing    Exercises      General Comments        Pertinent Vitals/Pain Pain Assessment Pain Assessment: No/denies pain    Home Living                          Prior Function            PT Goals (current goals can now be found in the care plan section) Progress towards PT goals: Progressing toward goals    Frequency    Min 3X/week      PT Plan      Co-evaluation              AM-PAC PT 6 Clicks Mobility   Outcome Measure  Help needed turning from your back to your side while in a flat bed without using bedrails?: A Lot Help needed moving from lying on your back to sitting on the side of a flat bed without using bedrails?: A Lot Help needed moving to and from a bed to a chair (including a wheelchair)?: A Lot Help needed standing up from a chair using your arms (e.g., wheelchair or bedside chair)?: A Lot Help needed to walk in hospital room?: A Lot Help needed climbing 3-5 steps with a railing? : Total 6 Click Score: 11    End of Session Equipment Utilized During Treatment: Gait belt Activity Tolerance: Patient tolerated treatment well Patient left: in chair;with call bell/phone within reach Nurse Communication: Mobility status PT Visit Diagnosis: Difficulty in walking, not elsewhere classified (R26.2);Muscle weakness (generalized) (M62.81)     Time: 8895-8864 PT Time Calculation (min) (ACUTE ONLY): 31 min  Charges:    $Gait Training: 8-22 mins $Therapeutic Activity: 8-22 mins PT General Charges $$ ACUTE PT VISIT: 1 Visit                     Katheryn Leap  PTA Acute  Rehabilitation Services Office M-F          [phone]

## 2024-01-07 NOTE — Progress Notes (Signed)
 Inpatient Rehab Admissions Coordinator:    I spoke with Pt.'s wife to discuss potential CIR admit. She is interested but Pt. Wants to go home. She is going to discuss with him and get back to me.   Leita Kleine, MS, CCC-SLP Rehab Admissions Coordinator  [phone] (celll) [phone] (office)

## 2024-01-08 ENCOUNTER — Telehealth: Payer: Self-pay | Admitting: Internal Medicine

## 2024-01-08 DIAGNOSIS — C7951 Secondary malignant neoplasm of bone: Secondary | ICD-10-CM | POA: Diagnosis not present

## 2024-01-08 DIAGNOSIS — D509 Iron deficiency anemia, unspecified: Secondary | ICD-10-CM | POA: Diagnosis not present

## 2024-01-08 DIAGNOSIS — K56609 Unspecified intestinal obstruction, unspecified as to partial versus complete obstruction: Secondary | ICD-10-CM | POA: Diagnosis not present

## 2024-01-08 DIAGNOSIS — C3492 Malignant neoplasm of unspecified part of left bronchus or lung: Secondary | ICD-10-CM | POA: Diagnosis not present

## 2024-01-08 MED ORDER — PANTOPRAZOLE SODIUM 40 MG PO TBEC: 40.0000 mg | DELAYED_RELEASE_TABLET | Freq: Two times a day (BID) | ORAL | 0 refills | Status: AC

## 2024-01-08 MED ORDER — NITROFURANTOIN MONOHYD MACRO 100 MG PO CAPS: 100.0000 mg | ORAL_CAPSULE | Freq: Two times a day (BID) | ORAL | 0 refills | Status: AC

## 2024-01-08 MED ORDER — GERHARDT'S BUTT CREAM
1.0000 | TOPICAL_CREAM | Freq: Four times a day (QID) | CUTANEOUS | Status: DC
Start: 2024-01-08 — End: 2024-02-01

## 2024-01-08 MED ORDER — GLUCERNA SHAKE PO LIQD: 237.0000 mL | Freq: Two times a day (BID) | ORAL | Status: DC

## 2024-01-08 NOTE — Discharge Summary (Signed)
 Physician Discharge Summary   Patient: Chad Moreno MRN: [id number] DOB: 13-Jan-1941  Admit date:     12/30/2023  Discharge date: 01/08/24  Discharge Physician: Elgie Butter   PCP: [name], [name], MD   Recommendations at discharge:  {Tip this will not be part of the note when signed- Example include specific recommendations for outpatient follow-up, pending tests to follow-up on. (Optional):26781}  ***  Discharge Diagnoses: Principal Problem:   SBO (small bowel obstruction) (HCC) Active Problems:   Non-small cell cancer of left lung (HCC)   Metastasis to bone [facility])  Resolved Problems:   * No resolved hospital problems. *  Hospital Course: Chad Moreno is a 82 year old man medical history of metastatic small cell lung cancer s/p SRB [place] completed 10/2022 and currently onencorafenib and binimetinib  started on 06/2023, chronic thrombocytopenia, essential hypertension and GERD.  Presented and complaining of diffuse abdominal pain for 1 day, abdominal discomfort, distention, nausea and vomiting x 2.  Also reported several piece of dark and loose stool.  Concern for cecal colitis and bowel obstruction.   ED:  patient is borderline hypotensive. Initial lactic acid 2.3 improved to 1.2. CBC normal WBC count, stable H&H 10.3 and 30 and low platelet count at baseline.  CMP showing elevated creatinine 1.32 otherwise unremarkable. Troponin x 2 within normal range.  Normal BNP. Patient is FOBT positive however hemoglobin is stable.  X-ray abdomen show gaseous distention of the small bowel measuring up to 4.2 cm compatible with small bowel obstruction.Lucency under the left hemidiaphragm is probably related to gaseous distention of the stomach . Free gas under the left hemidiaphragm is considered less likely but not excluded. This could also be further assessed at the time of CT.  CTA: CT abd/ Pelvis :  no PE, New wall thickening and inflammatory fat stranding about the cecum, consistent  with nonspecific infectious or inflammatory colitis. 3. Mildly distended, fluid-filled loops of mid to distal small bowel throughout the abdomen, largest, distal loops measuring up to 4.1 cm in caliber. Diminished volume of a left pleural effusion   Unchanged sclerotic osseous metastases of the posterior right ninth rib and T9 vertebral body. Unchanged sclerotic osseous metastases of the left sacrum and ilium  - Received 1 L of LR bolus, IV Protonix  and Zofran . NG tube has been ordered by ED physician. Dr. Carita consulted and sent secure chat message to general surgery and [facility] as well.  Hospitalist has been consulted for further evaluation management of small bowel obstruction, AKI and GI bleed.     Assessment & Plan:   Principal Problem:   SBO (small bowel obstruction) (HCC) Active Problems:   Non-small cell cancer of left lung (HCC)   Metastasis to bone (HCC)  Bowel obstruction - CT abdomen reviewed,New wall thickening and inflammatory fat stranding about the cecum, consistent with nonspecific infectious or inflammatory colitis. 3. Mildly distended, fluid-filled loops of mid to distal small bowel throughout the abdomen, largest, distal loops measuring up to 4.1 cm in caliber.  -S/P NG tube placement  = to wall suction - Improved nausea, no vomiting  -IV fluid -N.p.o. -Hold home aspirin -Pain and nausea medication as needed - Consulted [facility] Dr. Rosalie, general surgery Dr. Chaya   Possible GI bleed/acute on chronic anemia - Monitoring H&H closely - Active signs of bleeding - Pending Hemoccult - Initiating IV Protonix     Latest Ref Rng & Units 12/31/2023    4:40 AM 12/30/2023    5:38 AM 12/30/2023  5:00 AM  CBC  WBC 4.0 - 10.5 K/uL 5.1   8.2   Hemoglobin 13.0 - 17.0 g/dL 9.0  9.2  89.9   Hematocrit 39.0 - 52.0 % 28.1  27.0  30.3   Platelets 150 - 400 K/uL 124   149       Non-small cell lung cancer -hold immunotherapy, his oncologist Dr. [name] added to  inpatient treatment team   Hypertension -NPO- hold home amlodipine and losartan while n.p.o., IV hydralazine  as needed in the meantime   AKI -Improving -in the setting of baseline normal renal function, likely due to vomiting and volume loss. -Improved -Hold nephrotoxins including losartan -IV fluids -Monitor renal function with daily labs Lab Results  Component Value Date   CREATININE 1.22 12/31/2023   CREATININE 1.40 (H) 12/30/2023   CREATININE 1.32 (H) 12/30/2023     Assessment and Plan: No notes have been filed under this hospital service. Service: Hospitalist     {Tip this will not be part of the note when signed Body mass index is 28.48 kg/m. , ,  (Optional):26781}  {(NOTE) Pain control PDMP Statment (Optional):26782} Consultants: *** Procedures performed: ***  Disposition: {Plan; Disposition:26390} Diet recommendation:  Discharge Diet Orders (From admission, onward)     Start     Ordered   01/08/24 0000  Diet - low sodium heart healthy        01/08/24 0954           {Diet_Plan:26776} DISCHARGE MEDICATION: Allergies as of 01/08/2024       Reactions   Sulfa Antibiotics Anaphylaxis        Medication List     PAUSE taking these medications    aspirin EC 81 MG tablet Wait to take this until: January 15, 2024 Take 81 mg by mouth in the morning.       STOP taking these medications    amLODipine 2.5 MG tablet Commonly known as: NORVASC   Braftovi  75 MG capsule Generic drug: encorafenib    losartan 100 MG tablet Commonly known as: COZAAR   Mektovi  15 MG tablet Generic drug: binimetinib        TAKE these medications    calcium carbonate 1500 (600 Ca) MG Tabs tablet Commonly known as: OSCAL Take 1,200 mg by mouth 2 (two) times daily with a meal.   celecoxib  200 MG capsule Commonly known as: CeleBREX  Take 1 capsule (200 mg total) by mouth 2 (two) times daily.   cyanocobalamin 1000 MCG tablet Commonly known as: VITAMIN B12 Take  1,000 mcg by mouth daily.   feeding supplement (GLUCERNA SHAKE) Liqd Take 237 mLs by mouth 2 (two) times daily between meals.   ferrous sulfate  325 (65 FE) MG EC tablet Take 1 tablet (325 mg total) by mouth daily as needed. What changed: when to take this   FISH OIL PO Take 1 capsule by mouth daily with breakfast.   Gerhardt's butt cream Crea Apply 1 Application topically 4 (four) times daily.   nitrofurantoin  (macrocrystal-monohydrate) 100 MG capsule Commonly known as: MACROBID  Take 1 capsule (100 mg total) by mouth every 12 (twelve) hours for 3 days.   oxyCODONE -acetaminophen  7.5-325 MG tablet Commonly known as: Percocet Take 1 tablet by mouth every 6 (six) hours as needed for severe pain (pain score 7-10).   pantoprazole  40 MG tablet Commonly known as: PROTONIX  Take 1 tablet (40 mg total) by mouth 2 (two) times daily.   POTASSIUM PO Take 1 tablet by mouth daily with breakfast.   PROBIOTIC PEARLS  ADVANTAGE PO Take 1 capsule by mouth in the morning.   silodosin  8 MG Caps capsule Commonly known as: RAPAFLO  Take 8 mg by mouth daily.   VITAMIN C PO Take 1 tablet by mouth daily with breakfast.   Vitamin D3 50 MCG (2000 UT) Tabs Take 2,000 Units by mouth daily with breakfast.   Vitamin-B Complex Tabs Take 1 tablet by mouth daily with breakfast.        Discharge Exam: Filed Weights   12/30/23 0315  Weight: 95.3 kg   ***  Condition at discharge: {DC Condition:26389}  The results of significant diagnostics from this hospitalization (including imaging, microbiology, ancillary and laboratory) are listed below for reference.   Imaging Studies: DG CHEST PORT 1 VIEW Result Date: 01/04/2024 CLINICAL DATA:  200808 Hypoxia [id number] EXAM: PORTABLE CHEST 1 VIEW COMPARISON:  December 30, 2023 FINDINGS: The cardiomediastinal silhouette is unchanged in contour. Similar irregular RIGHT perihilar contour compared to prior with associated biopsy clip. Similar biopsy clip in  association with a LEFT superior lung mass. The enteric tube courses through the chest to the abdomen beyond the field-of-view. Atherosclerotic calcifications of the aorta. No pleural effusion. No pneumothorax. No acute pleuroparenchymal abnormality. Lytic lesion of the LEFT posterolateral third rib. IMPRESSION: 1. No acute cardiopulmonary abnormality. 2. Similar appearance of known pulmonary malignancy including a metastatic lesion of the LEFT posterior third rib. Electronically Signed   By: Corean Salter M.D.   On: 01/04/2024 17:53   CT ABDOMEN PELVIS W CONTRAST Result Date: 01/03/2024 CLINICAL DATA:  Follow-up colitis and bowel obstruction. Abdominal pain. * Tracking Code: BO * EXAM: CT ABDOMEN AND PELVIS WITH CONTRAST TECHNIQUE: Multidetector CT imaging of the abdomen and pelvis was performed using the standard protocol following bolus administration of intravenous contrast. RADIATION DOSE REDUCTION: This exam was performed according to the departmental dose-optimization program which includes automated exposure control, adjustment of the mA and/or kV according to patient size and/or use of iterative reconstruction technique. CONTRAST:  OMNIPAQUE  IOHEXOL  300 MG/ML  SOLN COMPARISON:  CT abdomen and pelvis dated 12/30/2023 FINDINGS: Lower chest: Post treatment changes of the left upper and right lower lobe. Trace right and moderate left pleural effusions. Partially imaged heart size is normal. Coronary artery calcifications. Hepatobiliary: No focal hepatic lesions. No intra or extrahepatic biliary ductal dilation. Gallbladder fundal adenomyomatosis. Layering hyperattenuation within the gallbladder may represent vicariously excreted contrast. Pancreas: No focal lesions or main ductal dilation. Spleen: Normal in size without focal abnormality. Adrenals/Urinary Tract: Nodular thickening of the left adrenal gland without discrete nodule. No right adrenal nodule. No suspicious renal mass, calculi or  hydronephrosis. Mild circumferential thickening of the urinary bladder. Stomach/Bowel: Enteric tube terminates in the gastric body. Normal appearance of the stomach. Small duodenal diverticulum arising from the third portion of the duodenum. Enteric contrast material reaches the rectum. Colonic diverticulosis without acute diverticulitis. Cecum is well distended without mural thickening. Normal appendix. Vascular/Lymphatic: Aortic atherosclerosis. No enlarged abdominal or pelvic lymph nodes. Reproductive: Mildly enlarged prostate gland with fiducials in-situ. Other: Small volume free fluid. No free air or fluid collection. Small fat-containing left inguinal hernia. Musculoskeletal: Unchanged sclerosis of the left sacroiliac joint, right T9 transverse process, and right posterior ninth rib. Multilevel degenerative changes of the partially imaged thoracic and lumbar spine. Mild body wall edema. IMPRESSION: 1. No evidence of bowel obstruction. Enteric contrast material reaches the rectum. 2. Mild circumferential thickening of the urinary bladder, which may be related to underdistention or cystitis. Recommend correlation with urinalysis. 3. Unchanged  sclerosis of the left sacroiliac joint, right T9 transverse process, and right posterior ninth rib, which may represent treated osseous metastases. 4. Trace right and moderate left pleural effusions. 5. Aortic Atherosclerosis (ICD10-I70.0). Electronically Signed   By: Limin  Xu M.D.   On: 01/03/2024 15:52   DG Abd Portable 1V Result Date: 01/03/2024 CLINICAL DATA:  Abdominal bloating cramping EXAM: PORTABLE ABDOMEN - 1 VIEW COMPARISON:  Abdominal radiograph dated 01/01/2024 FINDINGS: Gastric/enteric tube tip projects over the stomach. Bowel gas is seen to the level rectum. Diffuse gas-filled dilation of the colon. No free air or pneumatosis. No abnormal radio-opaque calculi or mass effect. No acute or substantial osseous abnormality. The sacrum and coccyx are partially  obscured by overlying bowel contents. Prostate fiducials project over the midline lower pelvis. IMPRESSION: 1. Diffuse gas-filled dilation of the colon, which may represent ileus. 2. Gastric/enteric tube tip projects over the stomach. Electronically Signed   By: Limin  Xu M.D.   On: 01/03/2024 12:03   DG Abd Portable 1V Result Date: 01/01/2024 CLINICAL DATA:  Of the if present present EXAM: PORTABLE ABDOMEN - 1 VIEW COMPARISON:  Portable abdomen film yesterday at 4:02 p.m. FINDINGS: 4:15 a.m. NGT curves left in the stomach with the tip in the proximal fundus. Contrast is progressed into the colon and seen as far as the distal descending segment. There is still small-bowel dilatation in the right mid abdomen up to 5 cm. No other dilated small bowel is seen with the prior study having demonstrated small-bowel dilatation throughout the upper to mid abdomen. There is no supine evidence of free air or other significant findings. In all other respects no further changes. IMPRESSION: 1. Contrast is progressed into the normal caliber colon and seen as far as the distal descending segment. 2. There is still small-bowel dilatation in the right mid abdomen up to 5 cm. No other dilated small bowel is seen with the prior study having demonstrated small-bowel dilatation throughout the upper to mid abdomen. 3. NGT curves to the left in the stomach with the tip in the proximal fundus. Electronically Signed   By: Francis Quam M.D.   On: 01/01/2024 06:52   DG Abd Portable 1V-Small Bowel Obstruction Protocol-initial, 8 hr delay Result Date: 12/31/2023 CLINICAL DATA:  History provided by technologist small-bowel obstruction. 8 hour delay EXAM: PORTABLE ABDOMEN - 1 VIEW COMPARISON:  Radiograph from earlier the same day at 6:54 a.m. FINDINGS: There is increased amount of gas throughout the small bowel and colon, which may represent adynamic ileus versus partial small bowel obstruction. There is no positive contrast seen in the  bowel loops. Consider radiographic follow up if clinically indicated. No evidence of pneumoperitoneum, within the limitations of a supine film. No acute osseous abnormalities. The soft tissues are within normal limits. Surgical changes, devices, tubes and lines: None. IMPRESSION: *There is increased amount of gas throughout the small bowel and colon, which may represent adynamic ileus versus partial small bowel obstruction. There is no positive contrast in the bowel loops. Consider radiographic follow up if clinically indicated. Electronically Signed   By: Ree Molt M.D.   On: 12/31/2023 16:35   DG Abd Portable 1V Result Date: 12/31/2023 CLINICAL DATA:  Small bowel obstruction. EXAM: PORTABLE ABDOMEN - 1 VIEW COMPARISON:  Abdominal radiograph dated 12/30/2023. FINDINGS: Enteric tube with tip in the left upper abdomen likely in the proximal stomach. Progression of small bowel dilatation measuring up to 5.6 cm in diameter. No free air noted. Excreted contrast noted in the  urinary bladder. Degenerative changes spine. No acute osseous pathology. IMPRESSION: Progression of small bowel dilatation. Electronically Signed   By: Vanetta Chou M.D.   On: 12/31/2023 11:02   DG Abd Portable 1V Result Date: 12/30/2023 CLINICAL DATA:  Encounter for nasogastric tube placement EXAM: PORTABLE ABDOMEN - 1 VIEW COMPARISON:  None Available. FINDINGS: NG tube with tip in the gastric fundus. Side port below the GE junction. Dilated loop of small bowel in the upper abdomen measures 4 cm IMPRESSION: NG tube in stomach.  Side port below the GE junction. Electronically Signed   By: Jackquline Boxer M.D.   On: 12/30/2023 12:25   CT Angio Chest PE W and/or Wo Contrast Result Date: 12/30/2023 CLINICAL DATA:  PE suspected, abdominal pain, non-small-cell lung cancer * Tracking Code: BO * EXAM: CT ANGIOGRAPHY CHEST CT ABDOMEN AND PELVIS WITH CONTRAST TECHNIQUE: Multidetector CT imaging of the chest was performed using the standard  protocol during bolus administration of intravenous contrast. Multiplanar CT image reconstructions and MIPs were obtained to evaluate the vascular anatomy. Multidetector CT imaging of the abdomen and pelvis was performed using the standard protocol during bolus administration of intravenous contrast. RADIATION DOSE REDUCTION: This exam was performed according to the departmental dose-optimization program which includes automated exposure control, adjustment of the mA and/or kV according to patient size and/or use of iterative reconstruction technique. CONTRAST:  OMNIPAQUE  IOHEXOL  350 MG/ML SOLN COMPARISON:  CT chest abdomen pelvis, 12/11/2023 FINDINGS: CT CHEST ANGIOGRAM FINDINGS Cardiovascular: Satisfactory opacification of the pulmonary arteries to the segmental level. No evidence of pulmonary embolism. Mild cardiomegaly. Three-vessel coronary artery calcifications. No pericardial effusion. Aortic atherosclerosis. Mediastinum/Nodes: No enlarged mediastinal, hilar, or axillary lymph nodes. Thyroid  gland, trachea, and esophagus demonstrate no significant findings. Lungs/Pleura: Diminished volume of a left pleural effusion. Otherwise unchanged findings when compared to recent restaging examination with treated masses in the left upper lobe and right lower lobe. Musculoskeletal: No chest wall abnormality. No acute osseous findings. Nondisplaced, subacute pathologic fracture of the posterior left third rib (series 4, image 36). Review of the MIP images confirms the above findings. CT ABDOMEN PELVIS FINDINGS Hepatobiliary: No solid liver abnormality is seen. No gallstones, gallbladder wall thickening, or biliary dilatation. Pancreas: Unremarkable. No pancreatic ductal dilatation or surrounding inflammatory changes. Spleen: Normal in size without significant abnormality. Adrenals/Urinary Tract: Adrenal glands are unremarkable. Kidneys are normal, without renal calculi, solid lesion, or hydronephrosis. Bladder is  unremarkable. Stomach/Bowel: Stomach is within normal limits. New wall thickening and inflammatory fat stranding about the cecum (series 2, image 47, series 8, image 93). Mildly distended, fluid-filled loops of mid to distal small bowel throughout the abdomen, largest, distal loops measuring up to 4.1 cm in caliber. The terminal ileum is thickened and fecalized (series 2, image 49). Colon is generally decompressed of the rectum with scattered gas and stool throughout. Sigmoid diverticulosis. Vascular/Lymphatic: Aortic atherosclerosis. No enlarged abdominal or pelvic lymph nodes. Reproductive: Prostatomegaly with biopsy marking clips and fiducials. Other: Small fat containing left inguinal hernia.  No ascites. Musculoskeletal: No acute osseous findings. Unchanged sclerotic metastases of the posterior right ninth rib and T9 vertebral body. Unchanged sclerotic osseous metastases of the left sacrum and ilium. IMPRESSION: 1. Negative examination for pulmonary embolism. 2. New wall thickening and inflammatory fat stranding about the cecum, consistent with nonspecific infectious or inflammatory colitis. 3. Mildly distended, fluid-filled loops of mid to distal small bowel throughout the abdomen, largest, distal loops measuring up to 4.1 cm in caliber. The terminal ileum is thickened and  fecalized. Findings suggest obstipation of the ileocecal valve secondary to colitis and/or associated infectious or inflammatory ileus. 4. Diminished volume of a left pleural effusion. Otherwise unchanged oncologic findings when compared to recent restaging examination with treated masses in the left upper lobe and right lower lobe. 5. Unchanged sclerotic osseous metastases of the posterior right ninth rib and T9 vertebral body. Unchanged sclerotic osseous metastases of the left sacrum and ilium. 6. Coronary artery disease. Aortic Atherosclerosis (ICD10-I70.0). Electronically Signed   By: Marolyn JONETTA Jaksch M.D.   On: 12/30/2023 06:51   CT  ABDOMEN PELVIS W CONTRAST Result Date: 12/30/2023 CLINICAL DATA:  PE suspected, abdominal pain, non-small-cell lung cancer * Tracking Code: BO * EXAM: CT ANGIOGRAPHY CHEST CT ABDOMEN AND PELVIS WITH CONTRAST TECHNIQUE: Multidetector CT imaging of the chest was performed using the standard protocol during bolus administration of intravenous contrast. Multiplanar CT image reconstructions and MIPs were obtained to evaluate the vascular anatomy. Multidetector CT imaging of the abdomen and pelvis was performed using the standard protocol during bolus administration of intravenous contrast. RADIATION DOSE REDUCTION: This exam was performed according to the departmental dose-optimization program which includes automated exposure control, adjustment of the mA and/or kV according to patient size and/or use of iterative reconstruction technique. CONTRAST:  OMNIPAQUE  IOHEXOL  350 MG/ML SOLN COMPARISON:  CT chest abdomen pelvis, 12/11/2023 FINDINGS: CT CHEST ANGIOGRAM FINDINGS Cardiovascular: Satisfactory opacification of the pulmonary arteries to the segmental level. No evidence of pulmonary embolism. Mild cardiomegaly. Three-vessel coronary artery calcifications. No pericardial effusion. Aortic atherosclerosis. Mediastinum/Nodes: No enlarged mediastinal, hilar, or axillary lymph nodes. Thyroid  gland, trachea, and esophagus demonstrate no significant findings. Lungs/Pleura: Diminished volume of a left pleural effusion. Otherwise unchanged findings when compared to recent restaging examination with treated masses in the left upper lobe and right lower lobe. Musculoskeletal: No chest wall abnormality. No acute osseous findings. Nondisplaced, subacute pathologic fracture of the posterior left third rib (series 4, image 36). Review of the MIP images confirms the above findings. CT ABDOMEN PELVIS FINDINGS Hepatobiliary: No solid liver abnormality is seen. No gallstones, gallbladder wall thickening, or biliary dilatation.  Pancreas: Unremarkable. No pancreatic ductal dilatation or surrounding inflammatory changes. Spleen: Normal in size without significant abnormality. Adrenals/Urinary Tract: Adrenal glands are unremarkable. Kidneys are normal, without renal calculi, solid lesion, or hydronephrosis. Bladder is unremarkable. Stomach/Bowel: Stomach is within normal limits. New wall thickening and inflammatory fat stranding about the cecum (series 2, image 47, series 8, image 93). Mildly distended, fluid-filled loops of mid to distal small bowel throughout the abdomen, largest, distal loops measuring up to 4.1 cm in caliber. The terminal ileum is thickened and fecalized (series 2, image 49). Colon is generally decompressed of the rectum with scattered gas and stool throughout. Sigmoid diverticulosis. Vascular/Lymphatic: Aortic atherosclerosis. No enlarged abdominal or pelvic lymph nodes. Reproductive: Prostatomegaly with biopsy marking clips and fiducials. Other: Small fat containing left inguinal hernia.  No ascites. Musculoskeletal: No acute osseous findings. Unchanged sclerotic metastases of the posterior right ninth rib and T9 vertebral body. Unchanged sclerotic osseous metastases of the left sacrum and ilium. IMPRESSION: 1. Negative examination for pulmonary embolism. 2. New wall thickening and inflammatory fat stranding about the cecum, consistent with nonspecific infectious or inflammatory colitis. 3. Mildly distended, fluid-filled loops of mid to distal small bowel throughout the abdomen, largest, distal loops measuring up to 4.1 cm in caliber. The terminal ileum is thickened and fecalized. Findings suggest obstipation of the ileocecal valve secondary to colitis and/or associated infectious or inflammatory ileus. 4. Diminished  volume of a left pleural effusion. Otherwise unchanged oncologic findings when compared to recent restaging examination with treated masses in the left upper lobe and right lower lobe. 5. Unchanged  sclerotic osseous metastases of the posterior right ninth rib and T9 vertebral body. Unchanged sclerotic osseous metastases of the left sacrum and ilium. 6. Coronary artery disease. Aortic Atherosclerosis (ICD10-I70.0). Electronically Signed   By: Marolyn JONETTA Jaksch M.D.   On: 12/30/2023 06:51   DG ABD ACUTE 2+V W 1V CHEST Result Date: 12/30/2023 CLINICAL DATA:  Abdominal pain. EXAM: DG ABDOMEN ACUTE WITH 1 VIEW CHEST COMPARISON:  Chest abdomen pelvis CT 12/11/2023 FINDINGS: Low lung volumes. Ill-defined opacities are seen in the right parahilar and left mid lungs with associated fiducial markers. No pulmonary edema or substantial pleural effusion. The cardio pericardial silhouette is enlarged. No acute bony abnormality. Supine abdomen shows gaseous distention of small bowel in the central abdomen measuring up to 4.2 cm diameter. Lucency under the left hemidiaphragm is probably related to gaseous distention of the stomach . Free air under the left hemidiaphragm is considered less likely but not excluded. Degenerative changes are seen in the lumbar spine. IMPRESSION: 1. Gaseous distention of small bowel in the central abdomen measuring up to 4.2 cm diameter. Imaging features are compatible with small bowel obstruction. CT abdomen and pelvis with oral and intravenous contrast could be used to further evaluate. 2. Lucency under the left hemidiaphragm is probably related to gaseous distention of the stomach . Free gas under the left hemidiaphragm is considered less likely but not excluded. This could also be further assessed at the time of CT. Electronically Signed   By: Camellia Candle M.D.   On: 12/30/2023 05:33   CT CHEST ABDOMEN PELVIS W CONTRAST Result Date: 12/12/2023 CLINICAL DATA:  Non-small cell lung cancer restaging, metastatic disease evaluation * Tracking Code: BO * EXAM: CT CHEST, ABDOMEN, AND PELVIS WITH CONTRAST TECHNIQUE: Multidetector CT imaging of the chest, abdomen and pelvis was performed following the  standard protocol during bolus administration of intravenous contrast. RADIATION DOSE REDUCTION: This exam was performed according to the departmental dose-optimization program which includes automated exposure control, adjustment of the mA and/or kV according to patient size and/or use of iterative reconstruction technique. CONTRAST:  OMNIPAQUE  IOHEXOL  300 MG/ML  SOLN COMPARISON:  09/06/2023 FINDINGS: CT CHEST FINDINGS Cardiovascular: Aortic atherosclerosis. Normal heart size. Left and right coronary artery calcifications. No pericardial effusion. Mediastinum/Nodes: No enlarged mediastinal, hilar, or axillary lymph nodes. Thyroid  gland, trachea, and esophagus demonstrate no significant findings. Lungs/Pleura: Unchanged elevation of the right hemidiaphragm. Perhaps slightly diminished size of a treated mass in the infrahilar right lower lobe, measuring 3.3 x 1.9 cm (series 4, image 77). Unchanged treated mass in the posterior left upper lobe measuring 4.7 x 2.3 cm (series 4, image 57). Diminished volume of a small left pleural effusion. No pleural effusion or pneumothorax. Musculoskeletal: No chest wall abnormality. No acute osseous findings. CT ABDOMEN PELVIS FINDINGS Hepatobiliary: No solid liver abnormality is seen. Contracted gallbladder. No gallstones, gallbladder wall thickening, or biliary dilatation. Pancreas: Unremarkable. No pancreatic ductal dilatation or surrounding inflammatory changes. Spleen: Normal in size without significant abnormality. Adrenals/Urinary Tract: Unchanged macroscopic fat containing left adrenal adenoma, benign, requiring no further follow-up or characterization. Kidneys are normal, without renal calculi, solid lesion, or hydronephrosis. Mild diffuse urinary bladder wall thickening, unchanged. Stomach/Bowel: Stomach is within normal limits. Appendix appears normal. No evidence of bowel wall thickening, distention, or inflammatory changes. Sigmoid diverticulosis.  Vascular/Lymphatic: Aortic atherosclerosis. No  enlarged abdominal or pelvic lymph nodes. Reproductive: Biopsy markers or fiducials in the prostate. Other: No abdominal wall hernia or abnormality. No ascites. Musculoskeletal: No acute osseous findings. 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 IMPRESSION: 1. Perhaps slightly diminished size of a treated mass in the infrahilar right lower lobe. Unchanged treated mass in the posterior left upper lobe. 2. Diminished volume of a small left pleural effusion. 3. 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 4. Unchanged post treatment appearance of the prostate and low pelvis. 5. No evidence of lymphadenopathy or soft tissue metastatic disease in the chest, abdomen, or pelvis. 6. Coronary artery disease. Aortic Atherosclerosis (ICD10-I70.0). Electronically Signed   By: Marolyn JONETTA Jaksch M.D.   On: 12/12/2023 06:49    Microbiology: Results for orders placed or performed during the hospital encounter of 12/30/23  Blood culture (routine x 2)     Status: None   Collection Time: 12/30/23  4:00 AM   Specimen: BLOOD  Result Value Ref Range Status   Specimen Description   Final    BLOOD LEFT ANTECUBITAL Performed at [facility], [address], [place], [place] [address]    Special Requests   Final    BOTTLES DRAWN AEROBIC AND ANAEROBIC Blood Culture adequate volume Performed at [facility], [address] [place], [place] [address]    Culture   Final    NO GROWTH 5 DAYS Performed at [facility], [address], [place], [place] [address]    Report Status 01/04/2024 FINAL  Final  Blood culture (routine x 2)     Status: None   Collection Time: 12/30/23  4:05 AM   Specimen: BLOOD LEFT HAND  Result Value Ref Range Status    Specimen Description   Final    BLOOD LEFT HAND Performed at [facility], [address], [place], [place] [address]    Special Requests   Final    BOTTLES DRAWN AEROBIC AND ANAEROBIC Blood Culture adequate volume Performed at [facility], [address] [place], [place] [address]    Culture   Final    NO GROWTH 5 DAYS Performed at [facility], [address] [place], [place] [address]    Report Status 01/04/2024 FINAL  Final  Urine Culture (for pregnant, neutropenic or urologic patients or patients with an indwelling urinary catheter)     Status: Abnormal   Collection Time: 01/03/24  4:21 PM   Specimen: Urine, Clean Catch  Result Value Ref Range Status   Specimen Description   Final    URINE, CLEAN CATCH Performed at [facility], [address], [place], [place] [address]    Special Requests   Final    NONE Performed at [facility], [address], [place], [place] [address]    Culture (A)  Final    >=100,000 COLONIES/mL ESCHERICHIA COLI Confirmed Extended Spectrum Beta-Lactamase Producer (ESBL).  In bloodstream infections from ESBL organisms, carbapenems are preferred over piperacillin/tazobactam. They are shown to have a lower risk of mortality.    Report Status 01/06/2024 FINAL  Final   Organism ID, Bacteria ESCHERICHIA COLI (A)  Final      Susceptibility   Escherichia coli - MIC*    AMPICILLIN >=  32 RESISTANT Resistant     CEFAZOLIN  >=64 RESISTANT Resistant     CEFEPIME 16 RESISTANT Resistant     CEFTRIAXONE  >=64 RESISTANT Resistant     CIPROFLOXACIN 1 RESISTANT Resistant     GENTAMICIN <=1 SENSITIVE Sensitive     IMIPENEM <=0.25 SENSITIVE Sensitive     NITROFURANTOIN  <=16 SENSITIVE Sensitive     TRIMETH/SULFA >=320 RESISTANT Resistant     AMPICILLIN/SULBACTAM 8 SENSITIVE Sensitive     PIP/TAZO <=4 SENSITIVE Sensitive ug/mL    * >=100,000 COLONIES/mL ESCHERICHIA COLI     Labs: CBC: Recent Labs  Lab 01/03/24 0425 01/04/24 0531 01/05/24 0534 01/06/24 1206  WBC 3.9* 4.2 4.3 5.1  NEUTROABS 2.9  --  3.2 3.6  HGB 8.0* 7.9* 7.5* 9.5*  HCT 25.2* 24.4* 23.5* 29.9*  MCV 89.7 87.1 89.0 88.5  PLT 113* 105* 115* 112*   Basic Metabolic Panel: Recent Labs  Lab 01/02/24 0850 01/03/24 0425 01/04/24 0531 01/05/24 0534  NA 138 138 136 139  K 4.1 3.6 3.5 3.6  CL 105 104 102 104  CO2 25 27 25 27   GLUCOSE 99 105* 89 90  BUN 15 12 10 11   CREATININE 0.80 0.77 0.70 0.71  CALCIUM 7.8* 7.7* 7.6* 7.6*  MG  --   --  2.0  --    Liver Function Tests: No results for input(s): AST, ALT, ALKPHOS, BILITOT, PROT, ALBUMIN in the last 168 hours. CBG: No results for input(s): GLUCAP in the last 168 hours.  Discharge time spent: {LESS THAN/GREATER UYJW:73611} 30 minutes.  Signed: Elgie Butter, MD [facility] 01/08/2024

## 2024-01-08 NOTE — Progress Notes (Signed)
 AVS reviewed w/ patient and wife who verbalized an understanding. Midline removed as noted. Abdominal binder placed on patient, thigh high ted hose placed on pt by this RN. Pt dressing for d/c to home. Pt instructed to remove Ted hose at night and to wear binder for comfort but not to sleep in it.

## 2024-01-08 NOTE — Plan of Care (Signed)
  Problem: Nutrition: Goal: Adequate nutrition will be maintained Outcome: Adequate for Discharge   Problem: Elimination: Goal: Will not experience complications related to bowel motility Outcome: Adequate for Discharge   Problem: Elimination: Goal: Will not experience complications related to urinary retention Outcome: Adequate for Discharge

## 2024-01-08 NOTE — Care Management Important Message (Signed)
 Important Message  Patient Details IM Letter given. Name: [name] MRN: [id number] Date of Birth: 06-04-1941   Important Message Given:  Yes - Medicare IM     Melba Ates 01/08/2024, 11:06 AM

## 2024-01-08 NOTE — Telephone Encounter (Signed)
 Spoke with patient and his wife about upcoming appointment changes

## 2024-01-08 NOTE — Progress Notes (Signed)
 Inpatient Rehab Admissions Coordinator:    Pt. Declining CIR, insisting he wants to go home. Wife has reservations about taking him home. I instructed her to speak with him and decide what they want.  Leita Kleine, MS, CCC-SLP Rehab Admissions Coordinator  [phone] (celll) [phone] (office)

## 2024-01-09 ENCOUNTER — Telehealth: Payer: Self-pay | Admitting: Medical Oncology

## 2024-01-09 ENCOUNTER — Other Ambulatory Visit (HOSPITAL_COMMUNITY): Payer: Self-pay

## 2024-01-09 ENCOUNTER — Other Ambulatory Visit: Payer: Self-pay | Admitting: Internal Medicine

## 2024-01-09 ENCOUNTER — Other Ambulatory Visit: Payer: Self-pay

## 2024-01-09 NOTE — Progress Notes (Signed)
 Clinical Intervention Note  Clinical Intervention Notes: The patient was hospitalized, and his medication was placed on hold. He believed he was to restart it after one week. I contacted Dr. Jeannett office for clarification, and Diane Bell, RN, advised that the patient should wait until Dr. [name] provides a confirmed restart date. I followed up with the patient and advised him to contact the office for clarification and to continue holding therapy until further guidance is given. He was understanding.   Clinical Intervention Outcomes: Improved therapy adherence   Silvano LOISE Dolly Specialty Pharmacist

## 2024-01-09 NOTE — Telephone Encounter (Signed)
 Chemo refill-Wife called wanting Dr [name] to refill his chemotherapy tablets _they were stopped by hospitalist . Wife said Dr [name] saw pt in the hospital and told her it was ok to hold his chemo pills for a week. I told her I will consult with [facility] tomorrow or Monday . She was ok with that.

## 2024-01-10 ENCOUNTER — Other Ambulatory Visit: Payer: Self-pay | Admitting: Internal Medicine

## 2024-01-10 ENCOUNTER — Other Ambulatory Visit: Payer: Self-pay

## 2024-01-10 MED ORDER — ENCORAFENIB 75 MG PO CAPS
450.0000 mg | ORAL_CAPSULE | Freq: Every day | ORAL | 3 refills | Status: DC
Start: 2024-01-10 — End: 2024-05-14
  Filled 2024-01-10: qty 180, 30d supply, fill #0
  Filled 2024-02-26 – 2024-03-10 (×2): qty 180, 30d supply, fill #1
  Filled 2024-04-15 – 2024-04-17 (×2): qty 180, 30d supply, fill #2
  Filled 2024-05-14: qty 180, 30d supply, fill #3

## 2024-01-10 MED ORDER — BINIMETINIB 15 MG PO TABS
45.0000 mg | ORAL_TABLET | Freq: Two times a day (BID) | ORAL | 3 refills | Status: DC
  Filled 2024-01-10: qty 180, 30d supply, fill #0
  Filled 2024-02-26 – 2024-03-10 (×2): qty 180, 30d supply, fill #1
  Filled 2024-04-15 – 2024-04-17 (×2): qty 180, 30d supply, fill #2
  Filled 2024-05-14: qty 180, 30d supply, fill #3

## 2024-01-10 NOTE — Progress Notes (Signed)
 Specialty Pharmacy Refill Coordination Note  Chad Moreno is a 83 y.o. male contacted today regarding refills of specialty medication(s) Binimetinib  (Mektovi ); Encorafenib  (Braftovi )   Patient requested Delivery   Delivery date: 01/17/24   Verified address: [address], [place], [place] [address]   Medication will be filled on 07.24.25.

## 2024-01-14 DIAGNOSIS — E785 Hyperlipidemia, unspecified: Secondary | ICD-10-CM | POA: Diagnosis not present

## 2024-01-14 DIAGNOSIS — I10 Essential (primary) hypertension: Secondary | ICD-10-CM | POA: Diagnosis not present

## 2024-01-15 ENCOUNTER — Ambulatory Visit

## 2024-01-15 VITALS — BP 116/75 | HR 78 | Temp 98.4°F | Resp 18 | Ht 73.0 in | Wt 202.2 lb

## 2024-01-15 DIAGNOSIS — D509 Iron deficiency anemia, unspecified: Secondary | ICD-10-CM

## 2024-01-15 MED ORDER — SODIUM CHLORIDE 0.9 % IV SOLN
510.0000 mg | Freq: Once | INTRAVENOUS | Status: AC
  Administered 2024-01-15: 510 mg via INTRAVENOUS
  Filled 2024-01-15: qty 17

## 2024-01-15 NOTE — Progress Notes (Signed)
 Diagnosis: Iron  Deficiency Anemia  Provider:  Praveen Mannam MD  Procedure: IV Infusion  IV Type: Peripheral, IV Location: L Hand  Feraheme (Ferumoxytol ), Dose: 510 mg  Infusion Start Time: 1459  Infusion Stop Time: 1516  Post Infusion IV Care: Observation period completed and Peripheral IV Discontinued  Discharge: Condition: Good, Destination: Home . AVS Declined  Performed by:  Maximiano JONELLE Pouch, LPN

## 2024-01-16 ENCOUNTER — Other Ambulatory Visit: Payer: Self-pay

## 2024-01-17 ENCOUNTER — Other Ambulatory Visit: Payer: Self-pay

## 2024-01-17 ENCOUNTER — Encounter (HOSPITAL_BASED_OUTPATIENT_CLINIC_OR_DEPARTMENT_OTHER): Payer: Self-pay

## 2024-01-17 ENCOUNTER — Emergency Department (HOSPITAL_BASED_OUTPATIENT_CLINIC_OR_DEPARTMENT_OTHER)

## 2024-01-17 ENCOUNTER — Emergency Department (HOSPITAL_BASED_OUTPATIENT_CLINIC_OR_DEPARTMENT_OTHER): Admitting: Radiology

## 2024-01-17 ENCOUNTER — Emergency Department (HOSPITAL_BASED_OUTPATIENT_CLINIC_OR_DEPARTMENT_OTHER)
Admission: EM | Admit: 2024-01-17 | Discharge: 2024-01-17 | Disposition: A | Discharge status: Home / Self Care | Source: Ambulatory Visit | Attending: Emergency Medicine | Admitting: Emergency Medicine

## 2024-01-17 DIAGNOSIS — R059 Cough, unspecified: Secondary | ICD-10-CM | POA: Diagnosis not present

## 2024-01-17 DIAGNOSIS — N281 Cyst of kidney, acquired: Secondary | ICD-10-CM | POA: Diagnosis not present

## 2024-01-17 DIAGNOSIS — R531 Weakness: Secondary | ICD-10-CM | POA: Insufficient documentation

## 2024-01-17 DIAGNOSIS — Z85118 Personal history of other malignant neoplasm of bronchus and lung: Secondary | ICD-10-CM | POA: Insufficient documentation

## 2024-01-17 DIAGNOSIS — Z8546 Personal history of malignant neoplasm of prostate: Secondary | ICD-10-CM | POA: Insufficient documentation

## 2024-01-17 DIAGNOSIS — R197 Diarrhea, unspecified: Secondary | ICD-10-CM | POA: Diagnosis not present

## 2024-01-17 DIAGNOSIS — J984 Other disorders of lung: Secondary | ICD-10-CM | POA: Diagnosis not present

## 2024-01-17 DIAGNOSIS — K5750 Diverticulosis of both small and large intestine without perforation or abscess without bleeding: Secondary | ICD-10-CM | POA: Diagnosis not present

## 2024-01-17 DIAGNOSIS — R918 Other nonspecific abnormal finding of lung field: Secondary | ICD-10-CM | POA: Diagnosis not present

## 2024-01-17 DIAGNOSIS — Z79899 Other long term (current) drug therapy: Secondary | ICD-10-CM | POA: Insufficient documentation

## 2024-01-17 DIAGNOSIS — C7951 Secondary malignant neoplasm of bone: Secondary | ICD-10-CM | POA: Diagnosis not present

## 2024-01-17 DIAGNOSIS — Z7982 Long term (current) use of aspirin: Secondary | ICD-10-CM | POA: Insufficient documentation

## 2024-01-17 DIAGNOSIS — I10 Essential (primary) hypertension: Secondary | ICD-10-CM | POA: Insufficient documentation

## 2024-01-17 DIAGNOSIS — Z87891 Personal history of nicotine dependence: Secondary | ICD-10-CM | POA: Diagnosis not present

## 2024-01-17 LAB — COMPREHENSIVE METABOLIC PANEL WITH GFR
ALT: 29 U/L (ref 0–44)
AST: 35 U/L (ref 15–41)
Albumin: 3.3 g/dL — ABNORMAL LOW (ref 3.5–5.0)
Alkaline Phosphatase: 74 U/L (ref 38–126)
Anion gap: 15 (ref 5–15)
BUN: 29 mg/dL — ABNORMAL HIGH (ref 8–23)
CO2: 22 mmol/L (ref 22–32)
Calcium: 8.8 mg/dL — ABNORMAL LOW (ref 8.9–10.3)
Chloride: 103 mmol/L (ref 98–111)
Creatinine, Ser: 1.21 mg/dL (ref 0.61–1.24)
GFR, Estimated: 60 mL/min — ABNORMAL LOW (ref >60)
Glucose, Bld: 155 mg/dL — ABNORMAL HIGH (ref 70–99)
Potassium: 3.4 mmol/L — ABNORMAL LOW (ref 3.5–5.1)
Sodium: 139 mmol/L (ref 135–145)
Total Bilirubin: 0.6 mg/dL (ref 0.0–1.2)
Total Protein: 6.3 g/dL — ABNORMAL LOW (ref 6.5–8.1)

## 2024-01-17 LAB — CBC
HCT: 29.4 % — ABNORMAL LOW (ref 39.0–52.0)
Hemoglobin: 9.8 g/dL — ABNORMAL LOW (ref 13.0–17.0)
MCH: 28.7 pg (ref 26.0–34.0)
MCHC: 33.3 g/dL (ref 30.0–36.0)
MCV: 86 fL (ref 80.0–100.0)
Platelets: 105 K/uL — ABNORMAL LOW (ref 150–400)
RBC: 3.42 MIL/uL — ABNORMAL LOW (ref 4.22–5.81)
RDW: 15.5 % (ref 11.5–15.5)
WBC: 6.8 K/uL (ref 4.0–10.5)
nRBC: 0 % (ref 0.0–0.2)

## 2024-01-17 LAB — LIPASE, BLOOD: Lipase: 27 U/L (ref 11–51)

## 2024-01-17 LAB — MAGNESIUM: Magnesium: 1.8 mg/dL (ref 1.7–2.4)

## 2024-01-17 MED ORDER — IOHEXOL 300 MG/ML  SOLN
100.0000 mL | Freq: Once | INTRAMUSCULAR | Status: AC | PRN
  Administered 2024-01-17: 100 mL via INTRAVENOUS

## 2024-01-17 MED ORDER — LOPERAMIDE HCL 2 MG PO CAPS
4.0000 mg | ORAL_CAPSULE | Freq: Once | ORAL | Status: AC
  Administered 2024-01-17: 4 mg via ORAL
  Filled 2024-01-17: qty 2

## 2024-01-17 MED ORDER — ONDANSETRON HCL 4 MG/2ML IJ SOLN
4.0000 mg | Freq: Once | INTRAMUSCULAR | Status: AC
  Administered 2024-01-17: 4 mg via INTRAVENOUS
  Filled 2024-01-17: qty 2

## 2024-01-17 MED ORDER — ONDANSETRON 4 MG PO TBDP: 4.0000 mg | ORAL_TABLET | Freq: Three times a day (TID) | ORAL | 0 refills | Status: AC | PRN

## 2024-01-17 MED ORDER — LACTATED RINGERS IV BOLUS
1000.0000 mL | Freq: Once | INTRAVENOUS | Status: AC
  Administered 2024-01-17: 1000 mL via INTRAVENOUS

## 2024-01-17 MED ORDER — LOPERAMIDE HCL 2 MG PO TABS: 2.0000 mg | ORAL_TABLET | Freq: Four times a day (QID) | ORAL | 0 refills | Status: AC | PRN

## 2024-01-17 NOTE — ED Triage Notes (Addendum)
 Onset yesterday of diarrhea and feeling unwell.  States feeling weak.  Cancer patient Lung cancer.  States having chills and shakiness.

## 2024-01-17 NOTE — ED Provider Notes (Signed)
 [place] [facility] AT [facility] Provider Note  CSN: [id number] Arrival date & time: 01/17/24 9042  Chief Complaint(s) Diarrhea and Weakness  HPI Chad Moreno is a 83 y.o. male who is here today for diarrhea.  Patient has a history of metastatic prostate cancer, is currently taking immunotherapy, Encorafenib  and Binimetinib .  He had stopped taking this after developing colitis, resumed this week.  Comes in today for diarrhea.   Past Medical History Past Medical History:  Diagnosis Date   Age-related cataract of left eye    Amblyopia, right eye    Arthritis    Cancer (HCC)    Dysuria    ED (erectile dysfunction)    Elevated PSA    Epidermal cyst    Family history of prostate cancer    Gout    Hip pain    Histoplasmosis    Hyperlipidemia    Hypermetropia, left eye    Hypertension    Impaired glucose tolerance    Joint pain    Left shoulder pain    Lower back pain    Melanocytic nevi, unspecified    Nummular dermatitis    Ocular hypertension    Palmar fascial fibromatosis    Prostate cancer screening    Regular astigmatism, right eye    Senile nuclear sclerosis    Patient Active Problem List   Diagnosis Date Noted   Iron  deficiency anemia, unspecified 01/06/2024   SBO (small bowel obstruction) (HCC) 12/30/2023   Metastasis to bone (HCC) 09/19/2023   Pleural effusion 06/13/2023   Non-small cell cancer of left lung (HCC) 09/05/2022   Pulmonary nodules 08/23/2022   Malignant neoplasm of prostate (HCC) 07/31/2022   Home Medication(s) Prior to Admission medications   Medication Sig Start Date End Date Taking? Authorizing Provider  Ascorbic Acid (VITAMIN C PO) Take 1 tablet by mouth daily with breakfast.    [provider]  aspirin EC 81 MG tablet Take 81 mg by mouth in the morning.    [provider]  B Complex Vitamins (VITAMIN-B COMPLEX) TABS Take 1 tablet by mouth daily with breakfast.    [provider]   binimetinib  (MEKTOVI ) 15 MG tablet Take 3 tablets (45 mg total) by mouth 2 (two) times daily. 01/10/24   [name] [name], MD  calcium carbonate (OSCAL) 1500 (600 Ca) MG TABS tablet Take 1,200 mg by mouth 2 (two) times daily with a meal.    [provider]  celecoxib  (CELEBREX ) 200 MG capsule Take 1 capsule (200 mg total) by mouth 2 (two) times daily. 12/03/23   Pickenpack-Cousar, Athena N, NP  Cholecalciferol (VITAMIN D3) 50 MCG (2000 UT) TABS Take 2,000 Units by mouth daily with breakfast.    [provider]  cyanocobalamin (VITAMIN B12) 1000 MCG tablet Take 1,000 mcg by mouth daily.    [provider]  encorafenib  (BRAFTOVI ) 75 MG capsule Take 6 capsules (450 mg total) by mouth daily. 01/10/24   [name] [name], MD  feeding supplement, GLUCERNA SHAKE, (GLUCERNA SHAKE) LIQD Take 237 mLs by mouth 2 (two) times daily between meals. 01/08/24   Akula, Vijaya, MD  ferrous sulfate  325 (65 FE) MG EC tablet Take 1 tablet (325 mg total) by mouth daily as needed. Patient taking differently: Take 325 mg by mouth every other day. 12/03/23   Pickenpack-Cousar, Athena N, NP  Nystatin (GERHARDT'S BUTT CREAM) CREA Apply 1 Application topically 4 (four) times daily. 01/08/24   Akula, Vijaya, MD  Omega-3 Fatty Acids (FISH OIL PO) Take  1 capsule by mouth daily with breakfast.    [provider]  oxyCODONE -acetaminophen  (PERCOCET) 7.5-325 MG tablet Take 1 tablet by mouth every 6 (six) hours as needed for severe pain (pain score 7-10). 12/18/23   Pickenpack-Cousar, Fannie SAILOR, NP  pantoprazole  (PROTONIX ) 40 MG tablet Take 1 tablet (40 mg total) by mouth 2 (two) times daily. 01/08/24 02/07/24  Akula, Vijaya, MD  POTASSIUM PO Take 1 tablet by mouth daily with breakfast.    [provider]  Probiotic Product (PROBIOTIC PEARLS ADVANTAGE PO) Take 1 capsule by mouth in the morning.    [provider]  silodosin  (RAPAFLO ) 8 MG CAPS capsule Take 8 mg by mouth daily. 07/04/23    [provider]                                                                                                                                    Past Surgical History Past Surgical History:  Procedure Laterality Date   BRONCHIAL BIOPSY  08/27/2022   Procedure: BRONCHIAL BIOPSIES;  Surgeon: Shelah Lamar RAMAN, MD;  Location: [facility] ENDOSCOPY;  Service: Pulmonary;;   BRONCHIAL BRUSHINGS  08/27/2022   Procedure: BRONCHIAL BRUSHINGS;  Surgeon: Shelah Lamar RAMAN, MD;  Location: [facility] ENDOSCOPY;  Service: Pulmonary;;   BRONCHIAL NEEDLE ASPIRATION BIOPSY  08/27/2022   Procedure: BRONCHIAL NEEDLE ASPIRATION BIOPSIES;  Surgeon: Shelah Lamar RAMAN, MD;  Location: [facility] ENDOSCOPY;  Service: Pulmonary;;   CONSTRICTING FINGER RING EXCISION Left    CYST REMOVAL TRUNK Left    left posterior shoulder   FIDUCIAL MARKER PLACEMENT  08/27/2022   Procedure: FIDUCIAL MARKER PLACEMENT;  Surgeon: Shelah Lamar RAMAN, MD;  Location: [facility] ENDOSCOPY;  Service: Pulmonary;;   GOLD SEED IMPLANT N/A 10/02/2022   Procedure: GOLD SEED IMPLANT;  Surgeon: Lovie Arlyss CROME, MD;  Location: [facility];  Service: Urology;  Laterality: N/A;   index finger Left    IR THORACENTESIS ASP PLEURAL SPACE W/IMG GUIDE  06/14/2023   Prostate needle biopsy     SPACE OAR INSTILLATION N/A 10/02/2022   Procedure: SPACE OAR INSTILLATION;  Surgeon: Lovie Arlyss CROME, MD;  Location: [facility];  Service: Urology;  Laterality: N/A;   Family History Family History  Problem Relation Age of Onset   Prostate cancer Father     Social History Social History   Tobacco Use   Smoking status: Former    Current packs/day: 0.00    Average packs/day: 0.5 packs/day for 10.0 years (5.0 ttl pk-yrs)    Types: Cigarettes    Start date: 70    Quit date: 53    Years since quitting: 44.5   Smokeless tobacco: Never  Vaping Use   Vaping status: Never Used  Substance Use Topics   Alcohol  use: Yes    Comment: rare   Drug use: Never    Allergies Sulfa antibiotics  Review of Systems Review of Systems  Physical Exam  Vital Signs  I have reviewed the triage vital signs BP (!) 111/59   Pulse 74   Temp 98.8 F (37.1 C) (Oral)   Resp 17   SpO2 95%   Physical Exam Vitals and nursing note reviewed.  Pulmonary:     Effort: Pulmonary effort is normal.  Abdominal:     General: Abdomen is flat. There is no distension.     Tenderness: There is no abdominal tenderness. There is no guarding.  Skin:    General: Skin is warm.  Neurological:     Mental Status: He is alert.     ED Results and Treatments Labs (all labs ordered are listed, but only abnormal results are displayed) Labs Reviewed  COMPREHENSIVE METABOLIC PANEL WITH GFR - Abnormal; Notable for the following components:      Result Value   Potassium 3.4 (*)    Glucose, Bld 155 (*)    BUN 29 (*)    Calcium 8.8 (*)    Total Protein 6.3 (*)    Albumin 3.3 (*)    GFR, Estimated 60 (*)    All other components within normal limits  CBC - Abnormal; Notable for the following components:   RBC 3.42 (*)    Hemoglobin 9.8 (*)    HCT 29.4 (*)    Platelets 105 (*)    All other components within normal limits  LIPASE, BLOOD  MAGNESIUM  URINALYSIS, ROUTINE W REFLEX MICROSCOPIC                                                                                                                          Radiology CT ABDOMEN PELVIS W CONTRAST Result Date: 01/17/2024 CLINICAL DATA:  Diarrhea and weakness, history of lung cancer EXAM: CT ABDOMEN AND PELVIS WITH CONTRAST TECHNIQUE: Multidetector CT imaging of the abdomen and pelvis was performed using the standard protocol following bolus administration of intravenous contrast. RADIATION DOSE REDUCTION: This exam was performed according to the departmental dose-optimization program which includes automated exposure control, adjustment of the mA and/or kV according to patient size and/or use of iterative reconstruction  technique. CONTRAST:  OMNIPAQUE  IOHEXOL  300 MG/ML  SOLN COMPARISON:  CT abdomen pelvis January 03, 2024 FINDINGS: Lower chest: Stable small right and moderate left pleural effusions. Subjacent atelectasis of bilateral lung base. The heart size is normal. Atherosclerotic calcifications of coronary arteries partially visualized. Trace pericardial fluid. Hepatobiliary: No focal hepatic lesions. No intra or extrahepatic biliary ductal dilation. Query gallbladder fundal adenomyomatosis. interval resolution of layering hyperattenuation within the gallbladder. No focal lesions or main ductal dilation. Spleen: Normal in size without focal abnormality. Adrenals/Urinary Tract: Nodular thickening of the left adrenal gland similar to prior. No right adrenal nodule. No suspicious renal mass, calculi or hydronephrosis. Mild circumferential thickening of the urinary bladder. Right kidney cortical cysts which does not require imaging follow-up. Left kidney lower pole punctate nonobstructive nephrolithiasis. Stomach/Bowel: Interval removal of the enteric tube. Normal appearance of the stomach. Small duodenal diverticulum  D3. Colonic diverticulosis. Peritoneal fat stranding in midline pelvic and left lower quadrant, similar to prior. Trace pelvic fluid. Of normal appendix. No abnormal bowel wall thickening or obstruction. No pericolonic fluid collection along the ascending colon. Vascular/Lymphatic: Aortic atherosclerosis. No enlarged abdominal or pelvic lymph nodes. Reproductive: Prostatomegaly.  Fiducial markers in place. Other: Mild peritoneal fat stranding and trace ascites, similar to prior. Small fat-containing left inguinal hernia. Small fat containing umbilical hernia Musculoskeletal: Multilevel degenerative changes of the spine. Osseous sclerotic changes involving the left sacrum and adjacent iliac bone, T9 transverse process and right posterior ninth rib. IMPRESSION: 1. No suspicious bowel lesion or obstruction. Compared  resolution of wall thickening and pericolonic fat stranding at the cecal region. Colonic diverticulosis. Persistent peritoneal fat stranding and trace free fluid predominantly in left pelvic cavity, similar to prior. 2.  Stable sclerotic osseous metastasis.  No new osseous lesion. 4.  Stable bilateral pleural effusion and subjacent atelectasis . 5.Nodular thickening of left adrenal gland . Viable or treated metastasis cannot be excluded in this patient with known malignancy. 5. Aortic Atherosclerosis (ICD10-I70.0). Electronically Signed   By: Megan  Zare M.D.   On: 01/17/2024 11:49   DG Chest 1 View Result Date: 01/17/2024 CLINICAL DATA:  Cough.  Known metastatic lung cancer EXAM: CHEST  1 VIEW COMPARISON:  01/04/2024 FINDINGS: Numerous leads and wires project over the chest. Midline trachea. Mild cardiomegaly. Atherosclerosis in the transverse aorta. No pleural effusion or pneumothorax. Mild right hemidiaphragm elevation. Inferior left upper lobe lung mass is estimated at 3.4 cm. Left lower lobe airspace disease medially. IMPRESSION: Medial left lower lobe airspace disease which could represent atelectasis or infection. Left upper lobe lung mass, as before. Aortic Atherosclerosis (ICD10-I70.0). Electronically Signed   By: Rockey Kilts M.D.   On: 01/17/2024 10:47    Pertinent labs & imaging results that were available during my care of the patient were reviewed by me and considered in my medical decision making (see MDM for details).  Medications Ordered in ED Medications  loperamide (IMODIUM) capsule 4 mg (has no administration in time range)  ondansetron  (ZOFRAN ) injection 4 mg (has no administration in time range)  lactated ringers  bolus 1,000 mL (0 mLs Intravenous Stopped 01/17/24 1135)  loperamide (IMODIUM) capsule 4 mg (4 mg Oral Given 01/17/24 1030)  ondansetron  (ZOFRAN ) injection 4 mg (4 mg Intravenous Given 01/17/24 1029)  iohexol  (OMNIPAQUE ) 300 MG/ML solution 100 mL (100 mLs Intravenous  Contrast Given 01/17/24 1057)  lactated ringers  bolus 1,000 mL (1,000 mLs Intravenous New Bag/Given 01/17/24 1235)                                                                                                                                     Procedures Procedures  (including critical care time)  Medical Decision Making / ED Course   This patient presents to the ED for concern of diarrhea, this involves an extensive number of treatment options,  and is a complaint that carries with it a high risk of complications and morbidity.  The differential diagnosis includes medication reaction, colitis, less likely bowel obstruction, pneumonia.  MDM: Patient overall well-appearing on exam.  His abdomen is soft, nontender.  He previously had a small bowel obstruction that resolved without surgical intervention.  I believe that his symptoms are likely related to restarting his immunotherapy.  Has been having pretty bad diarrhea over the last couple of days.  Have ordered basic blood work, CT imaging and electrolytes.  Will provide the patient with IV fluids, Imodium.  Chest x-ray ordered given report of chills, however patient without any fever, lower suspicion for infection.  Reassessment 2:10 PM-plain film shows stable small pleural effusion.  CT abdomen pelvis negative for acute process.  Likely related to recent immunotherapy.  Patient has not had any diarrhea since being in the ED.  Mild AKI.  He has received 2 L of fluid.  O2 saturation 9798% while in the room.  There is been discussion about having him start home oxygen due to his lung cancer.  Does not require admission at this time.  Message sent to patient's primary oncologist.  Will discharge.   Additional history obtained: -Additional history obtained from family at bedside -External records from outside source obtained and reviewed including: Chart review including previous notes, labs, imaging, consultation notes   Lab Tests: -I ordered,  reviewed, and interpreted labs.   The pertinent results include:   Labs Reviewed  COMPREHENSIVE METABOLIC PANEL WITH GFR - Abnormal; Notable for the following components:      Result Value   Potassium 3.4 (*)    Glucose, Bld 155 (*)    BUN 29 (*)    Calcium 8.8 (*)    Total Protein 6.3 (*)    Albumin 3.3 (*)    GFR, Estimated 60 (*)    All other components within normal limits  CBC - Abnormal; Notable for the following components:   RBC 3.42 (*)    Hemoglobin 9.8 (*)    HCT 29.4 (*)    Platelets 105 (*)    All other components within normal limits  LIPASE, BLOOD  MAGNESIUM  URINALYSIS, ROUTINE W REFLEX MICROSCOPIC      EKG my independent review of the patient's EKG shows no ST segment depressions or elevations, no T wave inversions, no evidence of acute ischemia.  EKG Interpretation Date/Time:  Friday January 17 2024 10:07:33 EDT Ventricular Rate:  85 PR Interval:  211 QRS Duration:  98 QT Interval:  507 QTC Calculation: 603 R Axis:   46  Text Interpretation: Sinus rhythm Low voltage, precordial leads Abnormal R-wave progression, early transition Nonspecific T abnrm, anterolateral leads Prolonged QT interval Confirmed by Mannie Pac [phone]) on 01/17/2024 10:20:36 AM         Imaging Studies ordered: I ordered imaging studies including chest x-ray, CT abdomen pelvis I independently visualized and interpreted imaging. I agree with the radiologist interpretation   Medicines ordered and prescription drug management: Meds ordered this encounter  Medications   lactated ringers  bolus 1,000 mL   loperamide (IMODIUM) capsule 4 mg   ondansetron  (ZOFRAN ) injection 4 mg   iohexol  (OMNIPAQUE ) 300 MG/ML solution 100 mL   lactated ringers  bolus 1,000 mL   loperamide (IMODIUM) capsule 4 mg   ondansetron  (ZOFRAN ) injection 4 mg    -I have reviewed the patients home medicines and have made adjustments as needed  Cardiac Monitoring: The patient was maintained on a cardiac  monitor.  I personally viewed and interpreted the cardiac monitored which showed an underlying rhythm of: Normal sinus rhythm  Social Determinants of Health:  Factors impacting patients care include: Multiple medical comorbidities including immunocompromise status, metastatic cancer   Reevaluation: After the interventions noted above, I reevaluated the patient and found that they have :improved  Co morbidities that complicate the patient evaluation  Past Medical History:  Diagnosis Date   Age-related cataract of left eye    Amblyopia, right eye    Arthritis    Cancer (HCC)    Dysuria    ED (erectile dysfunction)    Elevated PSA    Epidermal cyst    Family history of prostate cancer    Gout    Hip pain    Histoplasmosis    Hyperlipidemia    Hypermetropia, left eye    Hypertension    Impaired glucose tolerance    Joint pain    Left shoulder pain    Lower back pain    Melanocytic nevi, unspecified    Nummular dermatitis    Ocular hypertension    Palmar fascial fibromatosis    Prostate cancer screening    Regular astigmatism, right eye    Senile nuclear sclerosis       Dispostion: I considered admission for this patient, however with the patient's improvement in his symptoms, I believe he is appropriate for discharge.  Discussed with the patient he preferred to go home.     Final Clinical Impression(s) / ED Diagnoses Final diagnoses:  Diarrhea, unspecified type     @PCDICTATION @    Mannie Pac T, DO 01/17/24 1414

## 2024-01-17 NOTE — Discharge Instructions (Signed)
 Your CT scan that was done today was normal.  Your labs showed some mild dehydration.  Make sure you are getting plenty of fluids over the next couple of days.  I sent a prescription for Imodium, which you can take 4-5 times per day.  I will send a prescription for Zofran , she has a nausea medication.  You may take this 3 times per day.  Please follow-up with your oncologist and your primary care doctor.  Return to the emergency room if you feel weak, dizzy, lightheaded, develop fever, continue to have frequent diarrhea, or are unable to eat or drink.

## 2024-01-17 NOTE — ED Notes (Signed)
   01/17/24 1042  Therapy Vitals  Patient Position (if appropriate) Lying  Respiratory Assessment  $ RT Protocol Assessment  Yes  Assessment Type Assess only  Respiratory Pattern Regular;Unlabored;Symmetrical  Chest Assessment Chest expansion symmetrical  Cough Non-productive  Bilateral Breath Sounds Clear;Diminished  Oxygen Therapy/Pulse Ox  O2 Device Nasal Cannula  O2 Therapy Oxygen  O2 Flow Rate (L/min) 2 L/min  SpO2 97 %   SpO2 as low as 80%, denies increased work of breath or shortness of breath.  Placed on 2lpm [place], SpO2 now 95% or >.

## 2024-01-17 NOTE — ED Notes (Signed)
 Pt given discharge instructions and reviewed prescriptions. Opportunities given for questions. Pt verbalizes understanding. PIV removed x1. Jillyn Hidden, RN

## 2024-01-19 NOTE — H&P (Signed)
 Chief Complaint: History of metastatic non-small cell lung with chronic back pain. Patent presents for costovertebral junction nerve block ( bupivicaine and triamcinolone  injection) for pain control  Referring Physician(s): Athena Pickenpack - Cousar NP  Supervising Physician: Jenna Hacker  Patient Status: [facility] - Out-pt  History of Present Illness: Chad Moreno is a 83 y.o. male outpatient. History of metastatic non-small cell lung cancer is/p SBRT. With  metastatic disease including left axillary, bilateral supraclavicular, abdominal lymphadenopathy, and malignant pleural effusion. The patient was seen for consultation in the Interventional Radiology Clinic on 6.18.25 with IR Attending Dr. Hacker Jenna regarding persistent and unrelenting right back pain.. At that time a detailed discussion regarding the Patient's medical condition including but not limited to possible treatment options took place. Following that discussion the Patient elected to proceed with costovertebralnerve block. The Patient presents today for costovertebral junction nerve block ( bupivicaine and triamcinolone  injection) for management of right back pain.   Currently endorsing pain to his buttocks from chronic diarrhea. Patient alert and laying in bed,calm. Denies any fevers, headache, chest pain, SOB, cough, abdominal pain, nausea, vomiting or bleeding.   81 mg of ASA. Labs from 7.25.25 are within acceptable parameters. No pertinent allergies. Patient has been NPO since midnight  Return precautions and treatment recommendations and follow-up discussed with the patient is who is agreeable with the plan.    Past Medical History:  Diagnosis Date   Age-related cataract of left eye    Amblyopia, right eye    Arthritis    Cancer (HCC)    Dysuria    ED (erectile dysfunction)    Elevated PSA    Epidermal cyst    Family history of prostate cancer    Gout    Hip pain    Histoplasmosis     Hyperlipidemia    Hypermetropia, left eye    Hypertension    Impaired glucose tolerance    Joint pain    Left shoulder pain    Lower back pain    Melanocytic nevi, unspecified    Nummular dermatitis    Ocular hypertension    Palmar fascial fibromatosis    Prostate cancer screening    Regular astigmatism, right eye    Senile nuclear sclerosis     Past Surgical History:  Procedure Laterality Date   BRONCHIAL BIOPSY  08/27/2022   Procedure: BRONCHIAL BIOPSIES;  Surgeon: Shelah Lamar RAMAN, MD;  Location: [facility];  Service: Pulmonary;;   BRONCHIAL BRUSHINGS  08/27/2022   Procedure: BRONCHIAL BRUSHINGS;  Surgeon: Shelah Lamar RAMAN, MD;  Location: [facility];  Service: Pulmonary;;   BRONCHIAL NEEDLE ASPIRATION BIOPSY  08/27/2022   Procedure: BRONCHIAL NEEDLE ASPIRATION BIOPSIES;  Surgeon: Shelah Lamar RAMAN, MD;  Location: [facility];  Service: Pulmonary;;   CONSTRICTING FINGER RING EXCISION Left    CYST REMOVAL TRUNK Left    left posterior shoulder   FIDUCIAL MARKER PLACEMENT  08/27/2022   Procedure: FIDUCIAL MARKER PLACEMENT;  Surgeon: Shelah Lamar RAMAN, MD;  Location: [facility];  Service: Pulmonary;;   GOLD SEED IMPLANT N/A 10/02/2022   Procedure: GOLD SEED IMPLANT;  Surgeon: Lovie Arlyss CROME, MD;  Location: [facility];  Service: Urology;  Laterality: N/A;   index finger Left    IR THORACENTESIS ASP PLEURAL SPACE W/IMG GUIDE  06/14/2023   Prostate needle biopsy     SPACE OAR INSTILLATION N/A 10/02/2022   Procedure: SPACE OAR INSTILLATION;  Surgeon: Lovie Arlyss CROME, MD;  Location: [facility];  Service: Urology;  Laterality: N/A;    Allergies: Sulfa antibiotics  Medications: Prior to Admission medications   Medication Sig Start Date End Date Taking? Authorizing Provider  Ascorbic Acid (VITAMIN C PO) Take 1 tablet by mouth daily with breakfast.    [provider]  aspirin EC 81 MG tablet Take 81 mg by mouth in the morning.    [provider]  B Complex Vitamins (VITAMIN-B COMPLEX) TABS Take 1 tablet by mouth daily with breakfast.    [provider]  binimetinib  (MEKTOVI ) 15 MG tablet Take 3 tablets (45 mg total) by mouth 2 (two) times daily. 01/10/24   [name] [name], MD  calcium carbonate (OSCAL) 1500 (600 Ca) MG TABS tablet Take 1,200 mg by mouth 2 (two) times daily with a meal.    [provider]  celecoxib  (CELEBREX ) 200 MG capsule Take 1 capsule (200 mg total) by mouth 2 (two) times daily. 12/03/23   Pickenpack-Cousar, Athena N, NP  Cholecalciferol (VITAMIN D3) 50 MCG (2000 UT) TABS Take 2,000 Units by mouth daily with breakfast.    [provider]  cyanocobalamin (VITAMIN B12) 1000 MCG tablet Take 1,000 mcg by mouth daily.    [provider]  encorafenib  (BRAFTOVI ) 75 MG capsule Take 6 capsules (450 mg total) by mouth daily. 01/10/24   [name] [name], MD  feeding supplement, GLUCERNA SHAKE, (GLUCERNA SHAKE) LIQD Take 237 mLs by mouth 2 (two) times daily between meals. 01/08/24   Akula, Vijaya, MD  ferrous sulfate  325 (65 FE) MG EC tablet Take 1 tablet (325 mg total) by mouth daily as needed. Patient taking differently: Take 325 mg by mouth every other day. 12/03/23   Pickenpack-Cousar, Athena N, NP  loperamide  (IMODIUM  A-D) 2 MG tablet Take 1 tablet (2 mg total) by mouth 4 (four) times daily as needed for diarrhea or loose stools. 01/17/24   Mannie Fairy DASEN, DO  Nystatin (GERHARDT'S BUTT CREAM) CREA Apply 1 Application topically 4 (four) times daily. 01/08/24   Akula, Vijaya, MD  Omega-3 Fatty Acids (FISH OIL PO) Take 1 capsule by mouth daily with breakfast.    [provider]  ondansetron  (ZOFRAN -ODT) 4 MG disintegrating tablet Take 1 tablet (4 mg total) by mouth every 8 (eight) hours as needed for nausea or vomiting. 01/17/24   Mannie Fairy T, DO  oxyCODONE -acetaminophen  (PERCOCET) 7.5-325 MG tablet Take 1 tablet by mouth every 6 (six) hours as needed for severe pain  (pain score 7-10). 12/18/23   Pickenpack-Cousar, Fannie SAILOR, NP  pantoprazole  (PROTONIX ) 40 MG tablet Take 1 tablet (40 mg total) by mouth 2 (two) times daily. 01/08/24 02/07/24  Akula, Vijaya, MD  POTASSIUM PO Take 1 tablet by mouth daily with breakfast.    [provider]  Probiotic Product (PROBIOTIC PEARLS ADVANTAGE PO) Take 1 capsule by mouth in the morning.    [provider]  silodosin  (RAPAFLO ) 8 MG CAPS capsule Take 8 mg by mouth daily. 07/04/23   [provider]     Family History  Problem Relation Age of Onset   Prostate cancer Father     Social History   Socioeconomic History   Marital status: Married    Spouse name: Barnie   Number of children: Not on file   Years of education: Not on file   Highest education level: Not on file  Occupational History   Not on file  Tobacco Use   Smoking status: Former    Current packs/day: 0.00    Average packs/day:  0.5 packs/day for 10.0 years (5.0 ttl pk-yrs)    Types: Cigarettes    Start date: 61    Quit date: 81    Years since quitting: 44.6   Smokeless tobacco: Never  Vaping Use   Vaping status: Never Used  Substance and Sexual Activity   Alcohol  use: Yes    Comment: rare   Drug use: Never   Sexual activity: Not on file  Other Topics Concern   Not on file  Social History Narrative   Not on file   Social Drivers of Health   Financial Resource Strain: Not on file  Food Insecurity: No Food Insecurity (12/30/2023)   Hunger Vital Sign    Worried About Running Out of Food in the Last Year: Never true    Ran Out of Food in the Last Year: Never true  Transportation Needs: No Transportation Needs (12/30/2023)   PRAPARE - Administrator, Civil Service (Medical): No    Lack of Transportation (Non-Medical): No  Physical Activity: Not on file  Stress: Not on file  Social Connections: Socially Integrated (12/30/2023)   Social Connection and Isolation Panel    Frequency of Communication with  Friends and Family: More than three times a week    Frequency of Social Gatherings with Friends and Family: Once a week    Attends Religious Services: More than 4 times per year    Active Member of [employer] or Organizations: Yes    Attends Engineer, structural: More than 4 times per year    Marital Status: Married     Review of Systems: A 12 point ROS discussed and pertinent positives are indicated in the HPI above.  All other systems are negative.  Review of Systems  Constitutional:  Negative for fever.  HENT:  Negative for congestion.   Respiratory:  Negative for cough and shortness of breath.   Cardiovascular:  Negative for chest pain.  Gastrointestinal:  Positive for diarrhea. Negative for abdominal pain.  Skin:  Positive for wound (from chronic diarrhea).  Neurological:  Negative for headaches.  Psychiatric/Behavioral:  Negative for behavioral problems and confusion.     Vital Signs: BP (!) 143/74   Pulse 79   Temp 98.2 F (36.8 C)   Resp 16   Ht 6' 1 (1.854 m)   Wt 200 lb (90.7 kg)   SpO2 98%   BMI 26.39 kg/m   Advance Care Plan: The advanced care plan/surrogate decision maker was discussed at the time of visit and the patient did not wish to discuss or was not able to name a surrogate decision maker or provide an advance care plan.    Physical Exam Vitals and nursing note reviewed.  Constitutional:      Appearance: He is well-developed.  HENT:     Head: Normocephalic.  Pulmonary:     Effort: Pulmonary effort is normal.  Musculoskeletal:        General: Normal range of motion.     Cervical back: Normal range of motion.  Skin:    General: Skin is dry.  Neurological:     Mental Status: He is alert and oriented to person, place, and time.     Imaging: CT ABDOMEN PELVIS W CONTRAST Result Date: 01/17/2024 CLINICAL DATA:  Diarrhea and weakness, history of lung cancer EXAM: CT ABDOMEN AND PELVIS WITH CONTRAST TECHNIQUE: Multidetector CT imaging of the  abdomen and pelvis was performed using the standard protocol following bolus administration of intravenous contrast. RADIATION DOSE  REDUCTION: This exam was performed according to the departmental dose-optimization program which includes automated exposure control, adjustment of the mA and/or kV according to patient size and/or use of iterative reconstruction technique. CONTRAST:  OMNIPAQUE  IOHEXOL  300 MG/ML  SOLN COMPARISON:  CT abdomen pelvis January 03, 2024 FINDINGS: Lower chest: Stable small right and moderate left pleural effusions. Subjacent atelectasis of bilateral lung base. The heart size is normal. Atherosclerotic calcifications of coronary arteries partially visualized. Trace pericardial fluid. Hepatobiliary: No focal hepatic lesions. No intra or extrahepatic biliary ductal dilation. Query gallbladder fundal adenomyomatosis. interval resolution of layering hyperattenuation within the gallbladder. No focal lesions or main ductal dilation. Spleen: Normal in size without focal abnormality. Adrenals/Urinary Tract: Nodular thickening of the left adrenal gland similar to prior. No right adrenal nodule. No suspicious renal mass, calculi or hydronephrosis. Mild circumferential thickening of the urinary bladder. Right kidney cortical cysts which does not require imaging follow-up. Left kidney lower pole punctate nonobstructive nephrolithiasis. Stomach/Bowel: Interval removal of the enteric tube. Normal appearance of the stomach. Small duodenal diverticulum D3. Colonic diverticulosis. Peritoneal fat stranding in midline pelvic and left lower quadrant, similar to prior. Trace pelvic fluid. Of normal appendix. No abnormal bowel wall thickening or obstruction. No pericolonic fluid collection along the ascending colon. Vascular/Lymphatic: Aortic atherosclerosis. No enlarged abdominal or pelvic lymph nodes. Reproductive: Prostatomegaly.  Fiducial markers in place. Other: Mild peritoneal fat stranding and trace  ascites, similar to prior. Small fat-containing left inguinal hernia. Small fat containing umbilical hernia Musculoskeletal: Multilevel degenerative changes of the spine. Osseous sclerotic changes involving the left sacrum and adjacent iliac bone, T9 transverse process and right posterior ninth rib. IMPRESSION: 1. No suspicious bowel lesion or obstruction. Compared resolution of wall thickening and pericolonic fat stranding at the cecal region. Colonic diverticulosis. Persistent peritoneal fat stranding and trace free fluid predominantly in left pelvic cavity, similar to prior. 2.  Stable sclerotic osseous metastasis.  No new osseous lesion. 4.  Stable bilateral pleural effusion and subjacent atelectasis . 5.Nodular thickening of left adrenal gland . Viable or treated metastasis cannot be excluded in this patient with known malignancy. 5. Aortic Atherosclerosis (ICD10-I70.0). Electronically Signed   By: Megan  Zare M.D.   On: 01/17/2024 11:49   DG Chest 1 View Result Date: 01/17/2024 CLINICAL DATA:  Cough.  Known metastatic lung cancer EXAM: CHEST  1 VIEW COMPARISON:  01/04/2024 FINDINGS: Numerous leads and wires project over the chest. Midline trachea. Mild cardiomegaly. Atherosclerosis in the transverse aorta. No pleural effusion or pneumothorax. Mild right hemidiaphragm elevation. Inferior left upper lobe lung mass is estimated at 3.4 cm. Left lower lobe airspace disease medially. IMPRESSION: Medial left lower lobe airspace disease which could represent atelectasis or infection. Left upper lobe lung mass, as before. Aortic Atherosclerosis (ICD10-I70.0). Electronically Signed   By: Rockey Kilts M.D.   On: 01/17/2024 10:47   DG CHEST PORT 1 VIEW Result Date: 01/04/2024 CLINICAL DATA:  [id number] Hypoxia [id number] EXAM: PORTABLE CHEST 1 VIEW COMPARISON:  December 30, 2023 FINDINGS: The cardiomediastinal silhouette is unchanged in contour. Similar irregular RIGHT perihilar contour compared to prior with associated biopsy  clip. Similar biopsy clip in association with a LEFT superior lung mass. The enteric tube courses through the chest to the abdomen beyond the field-of-view. Atherosclerotic calcifications of the aorta. No pleural effusion. No pneumothorax. No acute pleuroparenchymal abnormality. Lytic lesion of the LEFT posterolateral third rib. IMPRESSION: 1. No acute cardiopulmonary abnormality. 2. Similar appearance of known pulmonary malignancy including a metastatic lesion of the  LEFT posterior third rib. Electronically Signed   By: Corean Salter M.D.   On: 01/04/2024 17:53   CT ABDOMEN PELVIS W CONTRAST Result Date: 01/03/2024 CLINICAL DATA:  Follow-up colitis and bowel obstruction. Abdominal pain. * Tracking Code: BO * EXAM: CT ABDOMEN AND PELVIS WITH CONTRAST TECHNIQUE: Multidetector CT imaging of the abdomen and pelvis was performed using the standard protocol following bolus administration of intravenous contrast. RADIATION DOSE REDUCTION: This exam was performed according to the departmental dose-optimization program which includes automated exposure control, adjustment of the mA and/or kV according to patient size and/or use of iterative reconstruction technique. CONTRAST:  OMNIPAQUE  IOHEXOL  300 MG/ML  SOLN COMPARISON:  CT abdomen and pelvis dated 12/30/2023 FINDINGS: Lower chest: Post treatment changes of the left upper and right lower lobe. Trace right and moderate left pleural effusions. Partially imaged heart size is normal. Coronary artery calcifications. Hepatobiliary: No focal hepatic lesions. No intra or extrahepatic biliary ductal dilation. Gallbladder fundal adenomyomatosis. Layering hyperattenuation within the gallbladder may represent vicariously excreted contrast. Pancreas: No focal lesions or main ductal dilation. Spleen: Normal in size without focal abnormality. Adrenals/Urinary Tract: Nodular thickening of the left adrenal gland without discrete nodule. No right adrenal nodule. No suspicious  renal mass, calculi or hydronephrosis. Mild circumferential thickening of the urinary bladder. Stomach/Bowel: Enteric tube terminates in the gastric body. Normal appearance of the stomach. Small duodenal diverticulum arising from the third portion of the duodenum. Enteric contrast material reaches the rectum. Colonic diverticulosis without acute diverticulitis. Cecum is well distended without mural thickening. Normal appendix. Vascular/Lymphatic: Aortic atherosclerosis. No enlarged abdominal or pelvic lymph nodes. Reproductive: Mildly enlarged prostate gland with fiducials in-situ. Other: Small volume free fluid. No free air or fluid collection. Small fat-containing left inguinal hernia. Musculoskeletal: Unchanged sclerosis of the left sacroiliac joint, right T9 transverse process, and right posterior ninth rib. Multilevel degenerative changes of the partially imaged thoracic and lumbar spine. Mild body wall edema. IMPRESSION: 1. No evidence of bowel obstruction. Enteric contrast material reaches the rectum. 2. Mild circumferential thickening of the urinary bladder, which may be related to underdistention or cystitis. Recommend correlation with urinalysis. 3. Unchanged sclerosis of the left sacroiliac joint, right T9 transverse process, and right posterior ninth rib, which may represent treated osseous metastases. 4. Trace right and moderate left pleural effusions. 5. Aortic Atherosclerosis (ICD10-I70.0). Electronically Signed   By: Limin  Xu M.D.   On: 01/03/2024 15:52   DG Abd Portable 1V Result Date: 01/03/2024 CLINICAL DATA:  Abdominal bloating cramping EXAM: PORTABLE ABDOMEN - 1 VIEW COMPARISON:  Abdominal radiograph dated 01/01/2024 FINDINGS: Gastric/enteric tube tip projects over the stomach. Bowel gas is seen to the level rectum. Diffuse gas-filled dilation of the colon. No free air or pneumatosis. No abnormal radio-opaque calculi or mass effect. No acute or substantial osseous abnormality. The sacrum and  coccyx are partially obscured by overlying bowel contents. Prostate fiducials project over the midline lower pelvis. IMPRESSION: 1. Diffuse gas-filled dilation of the colon, which may represent ileus. 2. Gastric/enteric tube tip projects over the stomach. Electronically Signed   By: Limin  Xu M.D.   On: 01/03/2024 12:03   DG Abd Portable 1V Result Date: 01/01/2024 CLINICAL DATA:  Of the if present present EXAM: PORTABLE ABDOMEN - 1 VIEW COMPARISON:  Portable abdomen film yesterday at 4:02 p.m. FINDINGS: 4:15 a.m. NGT curves left in the stomach with the tip in the proximal fundus. Contrast is progressed into the colon and seen as far as the distal descending segment. There  is still small-bowel dilatation in the right mid abdomen up to 5 cm. No other dilated small bowel is seen with the prior study having demonstrated small-bowel dilatation throughout the upper to mid abdomen. There is no supine evidence of free air or other significant findings. In all other respects no further changes. IMPRESSION: 1. Contrast is progressed into the normal caliber colon and seen as far as the distal descending segment. 2. There is still small-bowel dilatation in the right mid abdomen up to 5 cm. No other dilated small bowel is seen with the prior study having demonstrated small-bowel dilatation throughout the upper to mid abdomen. 3. NGT curves to the left in the stomach with the tip in the proximal fundus. Electronically Signed   By: Francis Quam M.D.   On: 01/01/2024 06:52   DG Abd Portable 1V-Small Bowel Obstruction Protocol-initial, 8 hr delay Result Date: 12/31/2023 CLINICAL DATA:  History provided by technologist small-bowel obstruction. 8 hour delay EXAM: PORTABLE ABDOMEN - 1 VIEW COMPARISON:  Radiograph from earlier the same day at 6:54 a.m. FINDINGS: There is increased amount of gas throughout the small bowel and colon, which may represent adynamic ileus versus partial small bowel obstruction. There is no positive  contrast seen in the bowel loops. Consider radiographic follow up if clinically indicated. No evidence of pneumoperitoneum, within the limitations of a supine film. No acute osseous abnormalities. The soft tissues are within normal limits. Surgical changes, devices, tubes and lines: None. IMPRESSION: *There is increased amount of gas throughout the small bowel and colon, which may represent adynamic ileus versus partial small bowel obstruction. There is no positive contrast in the bowel loops. Consider radiographic follow up if clinically indicated. Electronically Signed   By: Ree Molt M.D.   On: 12/31/2023 16:35   DG Abd Portable 1V Result Date: 12/31/2023 CLINICAL DATA:  Small bowel obstruction. EXAM: PORTABLE ABDOMEN - 1 VIEW COMPARISON:  Abdominal radiograph dated 12/30/2023. FINDINGS: Enteric tube with tip in the left upper abdomen likely in the proximal stomach. Progression of small bowel dilatation measuring up to 5.6 cm in diameter. No free air noted. Excreted contrast noted in the urinary bladder. Degenerative changes spine. No acute osseous pathology. IMPRESSION: Progression of small bowel dilatation. Electronically Signed   By: Vanetta Chou M.D.   On: 12/31/2023 11:02   DG Abd Portable 1V Result Date: 12/30/2023 CLINICAL DATA:  Encounter for nasogastric tube placement EXAM: PORTABLE ABDOMEN - 1 VIEW COMPARISON:  None Available. FINDINGS: NG tube with tip in the gastric fundus. Side port below the GE junction. Dilated loop of small bowel in the upper abdomen measures 4 cm IMPRESSION: NG tube in stomach.  Side port below the GE junction. Electronically Signed   By: Jackquline Boxer M.D.   On: 12/30/2023 12:25   CT Angio Chest PE W and/or Wo Contrast Result Date: 12/30/2023 CLINICAL DATA:  PE suspected, abdominal pain, non-small-cell lung cancer * Tracking Code: BO * EXAM: CT ANGIOGRAPHY CHEST CT ABDOMEN AND PELVIS WITH CONTRAST TECHNIQUE: Multidetector CT imaging of the chest was performed  using the standard protocol during bolus administration of intravenous contrast. Multiplanar CT image reconstructions and MIPs were obtained to evaluate the vascular anatomy. Multidetector CT imaging of the abdomen and pelvis was performed using the standard protocol during bolus administration of intravenous contrast. RADIATION DOSE REDUCTION: This exam was performed according to the departmental dose-optimization program which includes automated exposure control, adjustment of the mA and/or kV according to patient size and/or use of iterative  reconstruction technique. CONTRAST:  OMNIPAQUE  IOHEXOL  350 MG/ML SOLN COMPARISON:  CT chest abdomen pelvis, 12/11/2023 FINDINGS: CT CHEST ANGIOGRAM FINDINGS Cardiovascular: Satisfactory opacification of the pulmonary arteries to the segmental level. No evidence of pulmonary embolism. Mild cardiomegaly. Three-vessel coronary artery calcifications. No pericardial effusion. Aortic atherosclerosis. Mediastinum/Nodes: No enlarged mediastinal, hilar, or axillary lymph nodes. Thyroid  gland, trachea, and esophagus demonstrate no significant findings. Lungs/Pleura: Diminished volume of a left pleural effusion. Otherwise unchanged findings when compared to recent restaging examination with treated masses in the left upper lobe and right lower lobe. Musculoskeletal: No chest wall abnormality. No acute osseous findings. Nondisplaced, subacute pathologic fracture of the posterior left third rib (series 4, image 36). Review of the MIP images confirms the above findings. CT ABDOMEN PELVIS FINDINGS Hepatobiliary: No solid liver abnormality is seen. No gallstones, gallbladder wall thickening, or biliary dilatation. Pancreas: Unremarkable. No pancreatic ductal dilatation or surrounding inflammatory changes. Spleen: Normal in size without significant abnormality. Adrenals/Urinary Tract: Adrenal glands are unremarkable. Kidneys are normal, without renal calculi, solid lesion, or  hydronephrosis. Bladder is unremarkable. Stomach/Bowel: Stomach is within normal limits. New wall thickening and inflammatory fat stranding about the cecum (series 2, image 47, series 8, image 93). Mildly distended, fluid-filled loops of mid to distal small bowel throughout the abdomen, largest, distal loops measuring up to 4.1 cm in caliber. The terminal ileum is thickened and fecalized (series 2, image 49). Colon is generally decompressed of the rectum with scattered gas and stool throughout. Sigmoid diverticulosis. Vascular/Lymphatic: Aortic atherosclerosis. No enlarged abdominal or pelvic lymph nodes. Reproductive: Prostatomegaly with biopsy marking clips and fiducials. Other: Small fat containing left inguinal hernia.  No ascites. Musculoskeletal: No acute osseous findings. Unchanged sclerotic metastases of the posterior right ninth rib and T9 vertebral body. Unchanged sclerotic osseous metastases of the left sacrum and ilium. IMPRESSION: 1. Negative examination for pulmonary embolism. 2. New wall thickening and inflammatory fat stranding about the cecum, consistent with nonspecific infectious or inflammatory colitis. 3. Mildly distended, fluid-filled loops of mid to distal small bowel throughout the abdomen, largest, distal loops measuring up to 4.1 cm in caliber. The terminal ileum is thickened and fecalized. Findings suggest obstipation of the ileocecal valve secondary to colitis and/or associated infectious or inflammatory ileus. 4. Diminished volume of a left pleural effusion. Otherwise unchanged oncologic findings when compared to recent restaging examination with treated masses in the left upper lobe and right lower lobe. 5. Unchanged sclerotic osseous metastases of the posterior right ninth rib and T9 vertebral body. Unchanged sclerotic osseous metastases of the left sacrum and ilium. 6. Coronary artery disease. Aortic Atherosclerosis (ICD10-I70.0). Electronically Signed   By: Marolyn JONETTA Jaksch M.D.   On:  12/30/2023 06:51   CT ABDOMEN PELVIS W CONTRAST Result Date: 12/30/2023 CLINICAL DATA:  PE suspected, abdominal pain, non-small-cell lung cancer * Tracking Code: BO * EXAM: CT ANGIOGRAPHY CHEST CT ABDOMEN AND PELVIS WITH CONTRAST TECHNIQUE: Multidetector CT imaging of the chest was performed using the standard protocol during bolus administration of intravenous contrast. Multiplanar CT image reconstructions and MIPs were obtained to evaluate the vascular anatomy. Multidetector CT imaging of the abdomen and pelvis was performed using the standard protocol during bolus administration of intravenous contrast. RADIATION DOSE REDUCTION: This exam was performed according to the departmental dose-optimization program which includes automated exposure control, adjustment of the mA and/or kV according to patient size and/or use of iterative reconstruction technique. CONTRAST:  OMNIPAQUE  IOHEXOL  350 MG/ML SOLN COMPARISON:  CT chest abdomen pelvis, 12/11/2023 FINDINGS: CT  CHEST ANGIOGRAM FINDINGS Cardiovascular: Satisfactory opacification of the pulmonary arteries to the segmental level. No evidence of pulmonary embolism. Mild cardiomegaly. Three-vessel coronary artery calcifications. No pericardial effusion. Aortic atherosclerosis. Mediastinum/Nodes: No enlarged mediastinal, hilar, or axillary lymph nodes. Thyroid  gland, trachea, and esophagus demonstrate no significant findings. Lungs/Pleura: Diminished volume of a left pleural effusion. Otherwise unchanged findings when compared to recent restaging examination with treated masses in the left upper lobe and right lower lobe. Musculoskeletal: No chest wall abnormality. No acute osseous findings. Nondisplaced, subacute pathologic fracture of the posterior left third rib (series 4, image 36). Review of the MIP images confirms the above findings. CT ABDOMEN PELVIS FINDINGS Hepatobiliary: No solid liver abnormality is seen. No gallstones, gallbladder wall thickening, or  biliary dilatation. Pancreas: Unremarkable. No pancreatic ductal dilatation or surrounding inflammatory changes. Spleen: Normal in size without significant abnormality. Adrenals/Urinary Tract: Adrenal glands are unremarkable. Kidneys are normal, without renal calculi, solid lesion, or hydronephrosis. Bladder is unremarkable. Stomach/Bowel: Stomach is within normal limits. New wall thickening and inflammatory fat stranding about the cecum (series 2, image 47, series 8, image 93). Mildly distended, fluid-filled loops of mid to distal small bowel throughout the abdomen, largest, distal loops measuring up to 4.1 cm in caliber. The terminal ileum is thickened and fecalized (series 2, image 49). Colon is generally decompressed of the rectum with scattered gas and stool throughout. Sigmoid diverticulosis. Vascular/Lymphatic: Aortic atherosclerosis. No enlarged abdominal or pelvic lymph nodes. Reproductive: Prostatomegaly with biopsy marking clips and fiducials. Other: Small fat containing left inguinal hernia.  No ascites. Musculoskeletal: No acute osseous findings. Unchanged sclerotic metastases of the posterior right ninth rib and T9 vertebral body. Unchanged sclerotic osseous metastases of the left sacrum and ilium. IMPRESSION: 1. Negative examination for pulmonary embolism. 2. New wall thickening and inflammatory fat stranding about the cecum, consistent with nonspecific infectious or inflammatory colitis. 3. Mildly distended, fluid-filled loops of mid to distal small bowel throughout the abdomen, largest, distal loops measuring up to 4.1 cm in caliber. The terminal ileum is thickened and fecalized. Findings suggest obstipation of the ileocecal valve secondary to colitis and/or associated infectious or inflammatory ileus. 4. Diminished volume of a left pleural effusion. Otherwise unchanged oncologic findings when compared to recent restaging examination with treated masses in the left upper lobe and right lower lobe.  5. Unchanged sclerotic osseous metastases of the posterior right ninth rib and T9 vertebral body. Unchanged sclerotic osseous metastases of the left sacrum and ilium. 6. Coronary artery disease. Aortic Atherosclerosis (ICD10-I70.0). Electronically Signed   By: Marolyn JONETTA Jaksch M.D.   On: 12/30/2023 06:51   DG ABD ACUTE 2+V W 1V CHEST Result Date: 12/30/2023 CLINICAL DATA:  Abdominal pain. EXAM: DG ABDOMEN ACUTE WITH 1 VIEW CHEST COMPARISON:  Chest abdomen pelvis CT 12/11/2023 FINDINGS: Low lung volumes. Ill-defined opacities are seen in the right parahilar and left mid lungs with associated fiducial markers. No pulmonary edema or substantial pleural effusion. The cardio pericardial silhouette is enlarged. No acute bony abnormality. Supine abdomen shows gaseous distention of small bowel in the central abdomen measuring up to 4.2 cm diameter. Lucency under the left hemidiaphragm is probably related to gaseous distention of the stomach . Free air under the left hemidiaphragm is considered less likely but not excluded. Degenerative changes are seen in the lumbar spine. IMPRESSION: 1. Gaseous distention of small bowel in the central abdomen measuring up to 4.2 cm diameter. Imaging features are compatible with small bowel obstruction. CT abdomen and pelvis with oral and intravenous contrast  could be used to further evaluate. 2. Lucency under the left hemidiaphragm is probably related to gaseous distention of the stomach . Free gas under the left hemidiaphragm is considered less likely but not excluded. This could also be further assessed at the time of CT. Electronically Signed   By: Camellia Candle M.D.   On: 12/30/2023 05:33    Labs:  CBC: Recent Labs    01/05/24 0534 01/06/24 1206 01/17/24 1020 01/20/24 1003  WBC 4.3 5.1 6.8 5.3  HGB 7.5* 9.5* 9.8* 9.9*  HCT 23.5* 29.9* 29.4* 30.3*  PLT 115* 112* 105* 106*    COAGS: Recent Labs    12/30/23 0402 01/20/24 1003  INR 0.9 1.1    BMP: Recent Labs     01/03/24 0425 01/04/24 0531 01/05/24 0534 01/17/24 1020  NA 138 136 139 139  K 3.6 3.5 3.6 3.4*  CL 104 102 104 103  CO2 27 25 27 22   GLUCOSE 105* 89 90 155*  BUN 12 10 11  29*  CALCIUM 7.7* 7.6* 7.6* 8.8*  CREATININE 0.77 0.70 0.71 1.21  GFRNONAA >60 >60 >60 60*    LIVER FUNCTION TESTS: Recent Labs    11/20/23 1434 12/18/23 1100 12/30/23 0534 01/17/24 1020  BILITOT 0.3 0.3 1.0 0.6  AST 20 15 33 35  ALT 16 12 18 29   ALKPHOS 55 54 62 74  PROT 6.6 6.2* 7.3 6.3*  ALBUMIN 3.8 3.5 3.5 3.3*    TUMOR MARKERS: No results for input(s): AFPTM, CEA, CA199, CHROMGRNA in the last 8760 hours.  Assessment and Plan:  83 y.o. male outpatient. History of metastatic non-small cell lung cancer is/p SBRT. With  metastatic disease including left axillary, bilateral supraclavicular, abdominal lymphadenopathy, and malignant pleural effusion. The patient was seen for consultation in the [facility] on 6.18.25 with IR Attending Dr. Cordella Banner regarding persistent and unrelenting right back pain.. At that time a detailed discussion regarding the Patient's medical condition including but not limited to possible treatment options took place. Following that discussion the Patient elected to proceed with costovertebral nerve block. The Patient presents today for costovertebral junction.SABRA   PLAN: IR Image Guided Costovertrebral Nerve Block   Risks and benefits discussed with the patient including bleeding, infection, damage to adjacent structures, dural puncture, nerve damage and the fact that the injection does not provide any, or only provides minimal, pain relief.  All of the patient's questions were answered, patient is agreeable to proceed. Consent signed and in chart.   Thank you for this interesting consult.  I greatly enjoyed meeting Chad Moreno and look forward to participating in their care.  A copy of this report was sent to the requesting provider on  this date.  Electronically Signed: Delon JAYSON Beagle, NP 01/20/2024, 12:17 PM   I spent a total of    15 Minutes in face to face in clinical consultation, greater than 50% of which was counseling/coordinating care for costovertebral nerve block

## 2024-01-20 ENCOUNTER — Ambulatory Visit (HOSPITAL_COMMUNITY)
Admission: RE | Admit: 2024-01-20 | Discharge: 2024-01-20 | Disposition: A | Discharge status: Home / Self Care | Source: Ambulatory Visit

## 2024-01-20 ENCOUNTER — Other Ambulatory Visit (HOSPITAL_COMMUNITY): Payer: Self-pay

## 2024-01-20 ENCOUNTER — Other Ambulatory Visit: Payer: Self-pay

## 2024-01-20 VITALS — BP 135/67 | HR 73 | Temp 98.2°F | Resp 16 | Ht 73.0 in | Wt 200.0 lb

## 2024-01-20 DIAGNOSIS — I10 Essential (primary) hypertension: Secondary | ICD-10-CM | POA: Diagnosis not present

## 2024-01-20 DIAGNOSIS — R0600 Dyspnea, unspecified: Secondary | ICD-10-CM | POA: Diagnosis not present

## 2024-01-20 DIAGNOSIS — I700 Atherosclerosis of aorta: Secondary | ICD-10-CM | POA: Diagnosis not present

## 2024-01-20 DIAGNOSIS — K529 Noninfective gastroenteritis and colitis, unspecified: Secondary | ICD-10-CM | POA: Diagnosis not present

## 2024-01-20 DIAGNOSIS — R0603 Acute respiratory distress: Secondary | ICD-10-CM | POA: Diagnosis not present

## 2024-01-20 DIAGNOSIS — F4329 Adjustment disorder with other symptoms: Secondary | ICD-10-CM | POA: Diagnosis not present

## 2024-01-20 DIAGNOSIS — G893 Neoplasm related pain (acute) (chronic): Secondary | ICD-10-CM

## 2024-01-20 DIAGNOSIS — I771 Stricture of artery: Secondary | ICD-10-CM | POA: Diagnosis not present

## 2024-01-20 DIAGNOSIS — I429 Cardiomyopathy, unspecified: Secondary | ICD-10-CM | POA: Diagnosis not present

## 2024-01-20 DIAGNOSIS — M109 Gout, unspecified: Secondary | ICD-10-CM | POA: Diagnosis not present

## 2024-01-20 DIAGNOSIS — R6510 Systemic inflammatory response syndrome (SIRS) of non-infectious origin without acute organ dysfunction: Secondary | ICD-10-CM | POA: Diagnosis not present

## 2024-01-20 DIAGNOSIS — B9620 Unspecified Escherichia coli [E. coli] as the cause of diseases classified elsewhere: Secondary | ICD-10-CM | POA: Diagnosis not present

## 2024-01-20 DIAGNOSIS — Z87891 Personal history of nicotine dependence: Secondary | ICD-10-CM | POA: Insufficient documentation

## 2024-01-20 DIAGNOSIS — K56609 Unspecified intestinal obstruction, unspecified as to partial versus complete obstruction: Secondary | ICD-10-CM | POA: Diagnosis not present

## 2024-01-20 DIAGNOSIS — T451X5A Adverse effect of antineoplastic and immunosuppressive drugs, initial encounter: Secondary | ICD-10-CM | POA: Diagnosis not present

## 2024-01-20 DIAGNOSIS — M546 Pain in thoracic spine: Secondary | ICD-10-CM | POA: Insufficient documentation

## 2024-01-20 DIAGNOSIS — I2510 Atherosclerotic heart disease of native coronary artery without angina pectoris: Secondary | ICD-10-CM | POA: Diagnosis not present

## 2024-01-20 DIAGNOSIS — R42 Dizziness and giddiness: Secondary | ICD-10-CM | POA: Diagnosis not present

## 2024-01-20 DIAGNOSIS — H5440 Blindness, one eye, unspecified eye: Secondary | ICD-10-CM | POA: Diagnosis not present

## 2024-01-20 DIAGNOSIS — J910 Malignant pleural effusion: Secondary | ICD-10-CM | POA: Diagnosis not present

## 2024-01-20 DIAGNOSIS — R0689 Other abnormalities of breathing: Secondary | ICD-10-CM | POA: Diagnosis not present

## 2024-01-20 DIAGNOSIS — R069 Unspecified abnormalities of breathing: Secondary | ICD-10-CM | POA: Diagnosis not present

## 2024-01-20 DIAGNOSIS — C3492 Malignant neoplasm of unspecified part of left bronchus or lung: Secondary | ICD-10-CM | POA: Diagnosis not present

## 2024-01-20 DIAGNOSIS — I110 Hypertensive heart disease with heart failure: Secondary | ICD-10-CM | POA: Diagnosis not present

## 2024-01-20 DIAGNOSIS — M1990 Unspecified osteoarthritis, unspecified site: Secondary | ICD-10-CM | POA: Diagnosis not present

## 2024-01-20 DIAGNOSIS — J9601 Acute respiratory failure with hypoxia: Secondary | ICD-10-CM | POA: Diagnosis not present

## 2024-01-20 DIAGNOSIS — I5021 Acute systolic (congestive) heart failure: Secondary | ICD-10-CM | POA: Diagnosis not present

## 2024-01-20 DIAGNOSIS — C7951 Secondary malignant neoplasm of bone: Secondary | ICD-10-CM

## 2024-01-20 DIAGNOSIS — L89320 Pressure ulcer of left buttock, unstageable: Secondary | ICD-10-CM | POA: Diagnosis not present

## 2024-01-20 DIAGNOSIS — I517 Cardiomegaly: Secondary | ICD-10-CM | POA: Diagnosis not present

## 2024-01-20 DIAGNOSIS — J90 Pleural effusion, not elsewhere classified: Secondary | ICD-10-CM | POA: Diagnosis not present

## 2024-01-20 DIAGNOSIS — E669 Obesity, unspecified: Secondary | ICD-10-CM | POA: Diagnosis not present

## 2024-01-20 DIAGNOSIS — J189 Pneumonia, unspecified organism: Secondary | ICD-10-CM | POA: Diagnosis not present

## 2024-01-20 DIAGNOSIS — C61 Malignant neoplasm of prostate: Secondary | ICD-10-CM | POA: Diagnosis not present

## 2024-01-20 DIAGNOSIS — R918 Other nonspecific abnormal finding of lung field: Secondary | ICD-10-CM | POA: Diagnosis not present

## 2024-01-20 DIAGNOSIS — R600 Localized edema: Secondary | ICD-10-CM | POA: Diagnosis not present

## 2024-01-20 DIAGNOSIS — D6481 Anemia due to antineoplastic chemotherapy: Secondary | ICD-10-CM | POA: Diagnosis not present

## 2024-01-20 DIAGNOSIS — I951 Orthostatic hypotension: Secondary | ICD-10-CM | POA: Diagnosis not present

## 2024-01-20 DIAGNOSIS — D649 Anemia, unspecified: Secondary | ICD-10-CM | POA: Diagnosis not present

## 2024-01-20 DIAGNOSIS — K219 Gastro-esophageal reflux disease without esophagitis: Secondary | ICD-10-CM | POA: Diagnosis not present

## 2024-01-20 DIAGNOSIS — E876 Hypokalemia: Secondary | ICD-10-CM | POA: Diagnosis not present

## 2024-01-20 DIAGNOSIS — Z79899 Other long term (current) drug therapy: Secondary | ICD-10-CM | POA: Diagnosis not present

## 2024-01-20 DIAGNOSIS — I2489 Other forms of acute ischemic heart disease: Secondary | ICD-10-CM | POA: Diagnosis not present

## 2024-01-20 DIAGNOSIS — Z66 Do not resuscitate: Secondary | ICD-10-CM | POA: Diagnosis not present

## 2024-01-20 DIAGNOSIS — Z7189 Other specified counseling: Secondary | ICD-10-CM | POA: Diagnosis not present

## 2024-01-20 DIAGNOSIS — R7881 Bacteremia: Secondary | ICD-10-CM | POA: Diagnosis not present

## 2024-01-20 DIAGNOSIS — Z515 Encounter for palliative care: Secondary | ICD-10-CM | POA: Diagnosis not present

## 2024-01-20 DIAGNOSIS — C3431 Malignant neoplasm of lower lobe, right bronchus or lung: Secondary | ICD-10-CM | POA: Diagnosis present

## 2024-01-20 DIAGNOSIS — D696 Thrombocytopenia, unspecified: Secondary | ICD-10-CM | POA: Diagnosis not present

## 2024-01-20 DIAGNOSIS — I509 Heart failure, unspecified: Secondary | ICD-10-CM | POA: Diagnosis not present

## 2024-01-20 DIAGNOSIS — R490 Dysphonia: Secondary | ICD-10-CM | POA: Diagnosis not present

## 2024-01-20 DIAGNOSIS — E785 Hyperlipidemia, unspecified: Secondary | ICD-10-CM | POA: Diagnosis not present

## 2024-01-20 DIAGNOSIS — J984 Other disorders of lung: Secondary | ICD-10-CM | POA: Diagnosis not present

## 2024-01-20 DIAGNOSIS — L89310 Pressure ulcer of right buttock, unstageable: Secondary | ICD-10-CM | POA: Diagnosis not present

## 2024-01-20 DIAGNOSIS — R0602 Shortness of breath: Secondary | ICD-10-CM | POA: Diagnosis not present

## 2024-01-20 DIAGNOSIS — D509 Iron deficiency anemia, unspecified: Secondary | ICD-10-CM | POA: Diagnosis not present

## 2024-01-20 DIAGNOSIS — C3490 Malignant neoplasm of unspecified part of unspecified bronchus or lung: Secondary | ICD-10-CM | POA: Diagnosis not present

## 2024-01-20 DIAGNOSIS — M549 Dorsalgia, unspecified: Secondary | ICD-10-CM | POA: Diagnosis not present

## 2024-01-20 HISTORY — PX: IR FACET JT INJ C/T 3RD PLUS LEVEL RIGHT W/FL/CT: IMG2358

## 2024-01-20 HISTORY — PX: IR FACET JT INJ C/T  SINGLE LEVEL RIGHT W/FL/CT: IMG2353

## 2024-01-20 HISTORY — PX: IR FACET JT INJ C/T  2ND LEVEL RIGHT W/FL/CT: IMG2356

## 2024-01-20 LAB — CBC
HCT: 30.3 % — ABNORMAL LOW (ref 39.0–52.0)
Hemoglobin: 9.9 g/dL — ABNORMAL LOW (ref 13.0–17.0)
MCH: 28.7 pg (ref 26.0–34.0)
MCHC: 32.7 g/dL (ref 30.0–36.0)
MCV: 87.8 fL (ref 80.0–100.0)
Platelets: 106 K/uL — ABNORMAL LOW (ref 150–400)
RBC: 3.45 MIL/uL — ABNORMAL LOW (ref 4.22–5.81)
RDW: 15.4 % (ref 11.5–15.5)
WBC: 5.3 K/uL (ref 4.0–10.5)
nRBC: 0 % (ref 0.0–0.2)

## 2024-01-20 LAB — PROTIME-INR
INR: 1.1 (ref 0.8–1.2)
Prothrombin Time: 14.5 s (ref 11.4–15.2)

## 2024-01-20 MED ORDER — OXYCODONE-ACETAMINOPHEN 5-325 MG PO TABS: 1.0000 | ORAL_TABLET | Freq: Four times a day (QID) | ORAL | 0 refills | Status: DC | PRN

## 2024-01-20 MED ORDER — BUPIVACAINE HCL 0.5 % IJ SOLN: 50.0000 mL | Freq: Once | INTRAMUSCULAR | Status: DC

## 2024-01-20 MED ORDER — FENTANYL CITRATE (PF) 100 MCG/2ML IJ SOLN
INTRAMUSCULAR | Status: AC | PRN
  Administered 2024-01-20: 25 ug via INTRAVENOUS
  Administered 2024-01-20: 50 ug via INTRAVENOUS
  Administered 2024-01-20 (×3): 25 ug via INTRAVENOUS

## 2024-01-20 MED ORDER — TRIAMCINOLONE ACETONIDE 40 MG/ML IJ SUSP
40.0000 mg | INTRAMUSCULAR | Status: AC
  Administered 2024-01-20: 40 mg
  Filled 2024-01-20: qty 1

## 2024-01-20 MED ORDER — BUPIVACAINE HCL (PF) 0.5 % IJ SOLN: 20.0000 mL | INTRAMUSCULAR | Status: DC

## 2024-01-20 MED ORDER — FENTANYL CITRATE (PF) 100 MCG/2ML IJ SOLN
INTRAMUSCULAR | Status: AC
  Filled 2024-01-20: qty 2

## 2024-01-20 MED ORDER — MIDAZOLAM HCL 2 MG/2ML IJ SOLN
INTRAMUSCULAR | Status: AC
  Filled 2024-01-20: qty 2

## 2024-01-20 MED ORDER — MIDAZOLAM HCL 2 MG/2ML IJ SOLN
INTRAMUSCULAR | Status: AC | PRN
  Administered 2024-01-20: 1 mg via INTRAVENOUS
  Administered 2024-01-20 (×2): .5 mg via INTRAVENOUS

## 2024-01-20 MED ORDER — BUPIVACAINE HCL 0.5 % IJ SOLN
50.0000 mL | INTRAMUSCULAR | Status: AC
  Administered 2024-01-20: 30 mL
  Filled 2024-01-20: qty 50

## 2024-01-20 MED ORDER — FENTANYL CITRATE (PF) 100 MCG/2ML IJ SOLN
INTRAMUSCULAR | Status: AC
Start: 2024-01-20 — End: 2024-01-20
  Filled 2024-01-20: qty 2

## 2024-01-20 NOTE — Procedures (Signed)
 Interventional Radiology Procedure Note  Procedure: Paravertebral nerve block T8,9,10  Complications: None  Estimated Blood Loss: < 10 mL  Findings: Bupivicaine and triamcinolone  injection for three level paravertebral nerve block.  Cordella DELENA Banner, MD

## 2024-01-21 DIAGNOSIS — H5440 Blindness, one eye, unspecified eye: Secondary | ICD-10-CM | POA: Diagnosis not present

## 2024-01-21 DIAGNOSIS — R600 Localized edema: Secondary | ICD-10-CM | POA: Diagnosis not present

## 2024-01-21 DIAGNOSIS — F4329 Adjustment disorder with other symptoms: Secondary | ICD-10-CM | POA: Diagnosis not present

## 2024-01-21 DIAGNOSIS — I10 Essential (primary) hypertension: Secondary | ICD-10-CM | POA: Diagnosis not present

## 2024-01-21 DIAGNOSIS — E669 Obesity, unspecified: Secondary | ICD-10-CM | POA: Diagnosis not present

## 2024-01-21 DIAGNOSIS — D649 Anemia, unspecified: Secondary | ICD-10-CM | POA: Diagnosis not present

## 2024-01-21 DIAGNOSIS — C61 Malignant neoplasm of prostate: Secondary | ICD-10-CM | POA: Diagnosis not present

## 2024-01-21 DIAGNOSIS — C3490 Malignant neoplasm of unspecified part of unspecified bronchus or lung: Secondary | ICD-10-CM | POA: Diagnosis not present

## 2024-01-21 DIAGNOSIS — E785 Hyperlipidemia, unspecified: Secondary | ICD-10-CM | POA: Diagnosis not present

## 2024-01-21 DIAGNOSIS — R490 Dysphonia: Secondary | ICD-10-CM | POA: Diagnosis not present

## 2024-01-21 DIAGNOSIS — M1990 Unspecified osteoarthritis, unspecified site: Secondary | ICD-10-CM | POA: Diagnosis not present

## 2024-01-21 DIAGNOSIS — K56609 Unspecified intestinal obstruction, unspecified as to partial versus complete obstruction: Secondary | ICD-10-CM | POA: Diagnosis not present

## 2024-01-22 ENCOUNTER — Emergency Department (HOSPITAL_COMMUNITY)

## 2024-01-22 ENCOUNTER — Inpatient Hospital Stay (HOSPITAL_COMMUNITY)
Admission: EM | Admit: 2024-01-22 | Discharge: 2024-02-01 | DRG: 291 | Disposition: A | Discharge status: Home Health | Attending: Internal Medicine | Admitting: Internal Medicine

## 2024-01-22 ENCOUNTER — Other Ambulatory Visit: Payer: Self-pay

## 2024-01-22 ENCOUNTER — Encounter (HOSPITAL_COMMUNITY): Payer: Self-pay | Admitting: Emergency Medicine

## 2024-01-22 DIAGNOSIS — R6510 Systemic inflammatory response syndrome (SIRS) of non-infectious origin without acute organ dysfunction: Secondary | ICD-10-CM | POA: Diagnosis present

## 2024-01-22 DIAGNOSIS — I2489 Other forms of acute ischemic heart disease: Secondary | ICD-10-CM | POA: Diagnosis present

## 2024-01-22 DIAGNOSIS — R7881 Bacteremia: Secondary | ICD-10-CM | POA: Diagnosis present

## 2024-01-22 DIAGNOSIS — R7989 Other specified abnormal findings of blood chemistry: Secondary | ICD-10-CM

## 2024-01-22 DIAGNOSIS — G8929 Other chronic pain: Secondary | ICD-10-CM | POA: Diagnosis present

## 2024-01-22 DIAGNOSIS — C3490 Malignant neoplasm of unspecified part of unspecified bronchus or lung: Secondary | ICD-10-CM | POA: Diagnosis not present

## 2024-01-22 DIAGNOSIS — R918 Other nonspecific abnormal finding of lung field: Secondary | ICD-10-CM | POA: Diagnosis not present

## 2024-01-22 DIAGNOSIS — Z515 Encounter for palliative care: Secondary | ICD-10-CM

## 2024-01-22 DIAGNOSIS — Z79899 Other long term (current) drug therapy: Secondary | ICD-10-CM

## 2024-01-22 DIAGNOSIS — I700 Atherosclerosis of aorta: Secondary | ICD-10-CM | POA: Diagnosis not present

## 2024-01-22 DIAGNOSIS — I2510 Atherosclerotic heart disease of native coronary artery without angina pectoris: Secondary | ICD-10-CM | POA: Diagnosis present

## 2024-01-22 DIAGNOSIS — D630 Anemia in neoplastic disease: Secondary | ICD-10-CM | POA: Diagnosis present

## 2024-01-22 DIAGNOSIS — Z882 Allergy status to sulfonamides status: Secondary | ICD-10-CM

## 2024-01-22 DIAGNOSIS — J984 Other disorders of lung: Secondary | ICD-10-CM | POA: Diagnosis not present

## 2024-01-22 DIAGNOSIS — Z87891 Personal history of nicotine dependence: Secondary | ICD-10-CM

## 2024-01-22 DIAGNOSIS — R350 Frequency of micturition: Secondary | ICD-10-CM | POA: Diagnosis present

## 2024-01-22 DIAGNOSIS — D509 Iron deficiency anemia, unspecified: Secondary | ICD-10-CM | POA: Diagnosis present

## 2024-01-22 DIAGNOSIS — I517 Cardiomegaly: Secondary | ICD-10-CM | POA: Diagnosis not present

## 2024-01-22 DIAGNOSIS — E876 Hypokalemia: Secondary | ICD-10-CM | POA: Diagnosis present

## 2024-01-22 DIAGNOSIS — Z66 Do not resuscitate: Secondary | ICD-10-CM | POA: Diagnosis present

## 2024-01-22 DIAGNOSIS — J189 Pneumonia, unspecified organism: Principal | ICD-10-CM

## 2024-01-22 DIAGNOSIS — R0600 Dyspnea, unspecified: Secondary | ICD-10-CM | POA: Diagnosis not present

## 2024-01-22 DIAGNOSIS — L8990 Pressure ulcer of unspecified site, unspecified stage: Secondary | ICD-10-CM | POA: Insufficient documentation

## 2024-01-22 DIAGNOSIS — D696 Thrombocytopenia, unspecified: Secondary | ICD-10-CM | POA: Diagnosis present

## 2024-01-22 DIAGNOSIS — J90 Pleural effusion, not elsewhere classified: Secondary | ICD-10-CM | POA: Diagnosis not present

## 2024-01-22 DIAGNOSIS — I110 Hypertensive heart disease with heart failure: Principal | ICD-10-CM | POA: Diagnosis present

## 2024-01-22 DIAGNOSIS — M109 Gout, unspecified: Secondary | ICD-10-CM | POA: Diagnosis present

## 2024-01-22 DIAGNOSIS — I429 Cardiomyopathy, unspecified: Secondary | ICD-10-CM | POA: Diagnosis present

## 2024-01-22 DIAGNOSIS — C7951 Secondary malignant neoplasm of bone: Secondary | ICD-10-CM | POA: Diagnosis present

## 2024-01-22 DIAGNOSIS — I951 Orthostatic hypotension: Secondary | ICD-10-CM | POA: Diagnosis present

## 2024-01-22 DIAGNOSIS — R300 Dysuria: Secondary | ICD-10-CM | POA: Diagnosis present

## 2024-01-22 DIAGNOSIS — B9620 Unspecified Escherichia coli [E. coli] as the cause of diseases classified elsewhere: Secondary | ICD-10-CM | POA: Diagnosis present

## 2024-01-22 DIAGNOSIS — N401 Enlarged prostate with lower urinary tract symptoms: Secondary | ICD-10-CM | POA: Diagnosis present

## 2024-01-22 DIAGNOSIS — I5021 Acute systolic (congestive) heart failure: Secondary | ICD-10-CM | POA: Diagnosis present

## 2024-01-22 DIAGNOSIS — D6481 Anemia due to antineoplastic chemotherapy: Secondary | ICD-10-CM

## 2024-01-22 DIAGNOSIS — Z8042 Family history of malignant neoplasm of prostate: Secondary | ICD-10-CM

## 2024-01-22 DIAGNOSIS — J9601 Acute respiratory failure with hypoxia: Secondary | ICD-10-CM | POA: Diagnosis present

## 2024-01-22 DIAGNOSIS — R0603 Acute respiratory distress: Secondary | ICD-10-CM | POA: Diagnosis not present

## 2024-01-22 DIAGNOSIS — K219 Gastro-esophageal reflux disease without esophagitis: Secondary | ICD-10-CM | POA: Diagnosis present

## 2024-01-22 DIAGNOSIS — C3492 Malignant neoplasm of unspecified part of left bronchus or lung: Secondary | ICD-10-CM | POA: Diagnosis present

## 2024-01-22 DIAGNOSIS — K529 Noninfective gastroenteritis and colitis, unspecified: Secondary | ICD-10-CM | POA: Diagnosis not present

## 2024-01-22 DIAGNOSIS — L89320 Pressure ulcer of left buttock, unstageable: Secondary | ICD-10-CM | POA: Diagnosis present

## 2024-01-22 DIAGNOSIS — E785 Hyperlipidemia, unspecified: Secondary | ICD-10-CM | POA: Diagnosis present

## 2024-01-22 DIAGNOSIS — L89310 Pressure ulcer of right buttock, unstageable: Secondary | ICD-10-CM | POA: Diagnosis present

## 2024-01-22 DIAGNOSIS — I509 Heart failure, unspecified: Principal | ICD-10-CM

## 2024-01-22 LAB — CBC WITH DIFFERENTIAL/PLATELET
Abs Immature Granulocytes: 0.15 K/uL — ABNORMAL HIGH (ref 0.00–0.07)
Basophils Absolute: 0 K/uL (ref 0.0–0.1)
Basophils Relative: 0 %
Eosinophils Absolute: 0 K/uL (ref 0.0–0.5)
Eosinophils Relative: 0 %
HCT: 33.2 % — ABNORMAL LOW (ref 39.0–52.0)
Hemoglobin: 10.5 g/dL — ABNORMAL LOW (ref 13.0–17.0)
Immature Granulocytes: 2 %
Lymphocytes Relative: 20 %
Lymphs Abs: 1.5 K/uL (ref 0.7–4.0)
MCH: 28.4 pg (ref 26.0–34.0)
MCHC: 31.6 g/dL (ref 30.0–36.0)
MCV: 89.7 fL (ref 80.0–100.0)
Monocytes Absolute: 0.6 K/uL (ref 0.1–1.0)
Monocytes Relative: 8 %
Neutro Abs: 5.5 K/uL (ref 1.7–7.7)
Neutrophils Relative %: 70 %
Platelets: 165 K/uL (ref 150–400)
RBC: 3.7 MIL/uL — ABNORMAL LOW (ref 4.22–5.81)
RDW: 16.1 % — ABNORMAL HIGH (ref 11.5–15.5)
WBC: 7.8 K/uL (ref 4.0–10.5)
nRBC: 0 % (ref 0.0–0.2)

## 2024-01-22 LAB — COMPREHENSIVE METABOLIC PANEL WITH GFR
ALT: 30 U/L (ref 0–44)
AST: 39 U/L (ref 15–41)
Albumin: 2.9 g/dL — ABNORMAL LOW (ref 3.5–5.0)
Alkaline Phosphatase: 60 U/L (ref 38–126)
Anion gap: 12 (ref 5–15)
BUN: 15 mg/dL (ref 8–23)
CO2: 22 mmol/L (ref 22–32)
Calcium: 8.4 mg/dL — ABNORMAL LOW (ref 8.9–10.3)
Chloride: 107 mmol/L (ref 98–111)
Creatinine, Ser: 0.81 mg/dL (ref 0.61–1.24)
GFR, Estimated: >60 mL/min (ref >60)
Glucose, Bld: 129 mg/dL — ABNORMAL HIGH (ref 70–99)
Potassium: 3.7 mmol/L (ref 3.5–5.1)
Sodium: 141 mmol/L (ref 135–145)
Total Bilirubin: 1 mg/dL (ref 0.0–1.2)
Total Protein: 6.1 g/dL — ABNORMAL LOW (ref 6.5–8.1)

## 2024-01-22 LAB — BLOOD GAS, VENOUS
Acid-Base Excess: 2.9 mmol/L — ABNORMAL HIGH (ref 0.0–2.0)
Bicarbonate: 25.7 mmol/L (ref 20.0–28.0)
O2 Saturation: 95.6 %
Patient temperature: 36.1
pCO2, Ven: 32 mmHg — ABNORMAL LOW (ref 44–60)
pH, Ven: 7.51 — ABNORMAL HIGH (ref 7.25–7.43)
pO2, Ven: 63 mmHg — ABNORMAL HIGH (ref 32–45)

## 2024-01-22 LAB — RESP PANEL BY RT-PCR (RSV, FLU A&B, COVID)  RVPGX2
Influenza A by PCR: NEGATIVE
Influenza B by PCR: NEGATIVE
Resp Syncytial Virus by PCR: NEGATIVE
SARS Coronavirus 2 by RT PCR: NEGATIVE

## 2024-01-22 LAB — BRAIN NATRIURETIC PEPTIDE: B Natriuretic Peptide: 2201.2 pg/mL — ABNORMAL HIGH (ref 0.0–100.0)

## 2024-01-22 LAB — TROPONIN I (HIGH SENSITIVITY): Troponin I (High Sensitivity): 127 ng/L (ref <18)

## 2024-01-22 MED ORDER — LORAZEPAM 2 MG/ML IJ SOLN
0.5000 mg | Freq: Once | INTRAMUSCULAR | Status: AC
  Administered 2024-01-22: 0.5 mg via INTRAVENOUS
  Filled 2024-01-22: qty 1

## 2024-01-22 MED ORDER — IOHEXOL 350 MG/ML SOLN
75.0000 mL | Freq: Once | INTRAVENOUS | Status: AC | PRN
  Administered 2024-01-22: 75 mL via INTRAVENOUS

## 2024-01-22 MED ORDER — ALBUTEROL SULFATE (2.5 MG/3ML) 0.083% IN NEBU
10.0000 mg/h | INHALATION_SOLUTION | Freq: Once | RESPIRATORY_TRACT | Status: AC
  Administered 2024-01-22: 10 mg/h via RESPIRATORY_TRACT
  Filled 2024-01-22: qty 12

## 2024-01-22 MED ORDER — IPRATROPIUM BROMIDE 0.02 % IN SOLN
1.0000 mg | Freq: Once | RESPIRATORY_TRACT | Status: AC
  Administered 2024-01-22: 1 mg via RESPIRATORY_TRACT
  Filled 2024-01-22: qty 5

## 2024-01-22 NOTE — ED Provider Notes (Signed)
 [facility] Provider Note  CSN: [id number] Arrival date & time: 01/22/24 2118  Chief Complaint(s) Respiratory Distress  HPI Chad Moreno is a 83 y.o. male with PMH metastatic small cell lung cancer currently on immunotherapy, histoplasmosis who presents emerged part for evaluation of respiratory distress.  Patient states that approximately 3 hours prior to arrival he had sudden onset severe shortness of breath.  Patient arrives with a DuoNeb running by EMS but is tachypneic with accessory muscle use.  No previous history of COPD or need for inhaler use.  Denies abdominal pain, nausea, vomiting, headache, fever or other systemic symptoms.   Past Medical History Past Medical History:  Diagnosis Date   Age-related cataract of left eye    Amblyopia, right eye    Arthritis    Cancer (HCC)    Dysuria    ED (erectile dysfunction)    Elevated PSA    Epidermal cyst    Family history of prostate cancer    Gout    Hip pain    Histoplasmosis    Hyperlipidemia    Hypermetropia, left eye    Hypertension    Impaired glucose tolerance    Joint pain    Left shoulder pain    Lower back pain    Melanocytic nevi, unspecified    Nummular dermatitis    Ocular hypertension    Palmar fascial fibromatosis    Prostate cancer screening    Regular astigmatism, right eye    Senile nuclear sclerosis    Patient Active Problem List   Diagnosis Date Noted   Iron  deficiency anemia, unspecified 01/06/2024   SBO (small bowel obstruction) (HCC) 12/30/2023   Metastasis to bone (HCC) 09/19/2023   Pleural effusion 06/13/2023   Non-small cell cancer of left lung (HCC) 09/05/2022   Pulmonary nodules 08/23/2022   Malignant neoplasm of prostate (HCC) 07/31/2022   Home Medication(s) Prior to Admission medications   Medication Sig Start Date End Date Taking? Authorizing Provider  Ascorbic Acid (VITAMIN C PO) Take 1 tablet by mouth daily with breakfast.     [provider]  aspirin EC 81 MG tablet Take 81 mg by mouth in the morning.    [provider]  B Complex Vitamins (VITAMIN-B COMPLEX) TABS Take 1 tablet by mouth daily with breakfast.    [provider]  binimetinib  (MEKTOVI ) 15 MG tablet Take 3 tablets (45 mg total) by mouth 2 (two) times daily. 01/10/24   [name] [name], MD  calcium carbonate (OSCAL) 1500 (600 Ca) MG TABS tablet Take 1,200 mg by mouth 2 (two) times daily with a meal.    [provider]  celecoxib  (CELEBREX ) 200 MG capsule Take 1 capsule (200 mg total) by mouth 2 (two) times daily. 12/03/23   Pickenpack-Cousar, Athena N, NP  Cholecalciferol (VITAMIN D3) 50 MCG (2000 UT) TABS Take 2,000 Units by mouth daily with breakfast.    [provider]  cyanocobalamin (VITAMIN B12) 1000 MCG tablet Take 1,000 mcg by mouth daily.    [provider]  encorafenib  (BRAFTOVI ) 75 MG capsule Take 6 capsules (450 mg total) by mouth daily. 01/10/24   [name] [name], MD  feeding supplement, GLUCERNA SHAKE, (GLUCERNA SHAKE) LIQD Take 237 mLs by mouth 2 (two) times daily between meals. Patient taking differently: Take 237 mLs by mouth 2 (two) times daily between meals. Taking premium shake 01/08/24   Cherlyn Labella, MD  ferrous sulfate  325 (65 FE) MG EC tablet Take 1 tablet (  325 mg total) by mouth daily as needed. Patient taking differently: Take 325 mg by mouth every other day. 12/03/23   Pickenpack-Cousar, Athena N, NP  loperamide  (IMODIUM  A-D) 2 MG tablet Take 1 tablet (2 mg total) by mouth 4 (four) times daily as needed for diarrhea or loose stools. 01/17/24   Mannie Fairy DASEN, DO  Nystatin (GERHARDT'S BUTT CREAM) CREA Apply 1 Application topically 4 (four) times daily. 01/08/24   Akula, Vijaya, MD  Omega-3 Fatty Acids (FISH OIL PO) Take 1 capsule by mouth daily with breakfast.    [provider]  ondansetron  (ZOFRAN -ODT) 4 MG disintegrating tablet Take 1 tablet (4 mg total) by mouth every 8  (eight) hours as needed for nausea or vomiting. 01/17/24   Mannie Fairy T, DO  oxyCODONE -acetaminophen  (PERCOCET) 5-325 MG tablet Take 1 tablet by mouth every 6 (six) hours as needed for up to 2 days for severe pain (pain score 7-10). 01/20/24 01/22/24  Pommier, Kimble DEL, PA-C  pantoprazole  (PROTONIX ) 40 MG tablet Take 1 tablet (40 mg total) by mouth 2 (two) times daily. 01/08/24 02/07/24  Akula, Vijaya, MD  POTASSIUM PO Take 1 tablet by mouth daily with breakfast.    [provider]  Probiotic Product (PROBIOTIC PEARLS ADVANTAGE PO) Take 1 capsule by mouth in the morning.    [provider]  silodosin  (RAPAFLO ) 8 MG CAPS capsule Take 8 mg by mouth daily. 07/04/23   [provider]                                                                                                                                    Past Surgical History Past Surgical History:  Procedure Laterality Date   BRONCHIAL BIOPSY  08/27/2022   Procedure: BRONCHIAL BIOPSIES;  Surgeon: Shelah Lamar RAMAN, MD;  Location[facility] ENDOSCOPY;  Service: Pulmonary;;   BRONCHIAL BRUSHINGS  08/27/2022   Procedure: BRONCHIAL BRUSHINGS;  Surgeon: Shelah Lamar RAMAN, MD;  Location: [facility] ENDOSCOPY;  Service: Pulmonary;;   BRONCHIAL NEEDLE ASPIRATION BIOPSY  08/27/2022   Procedure: BRONCHIAL NEEDLE ASPIRATION BIOPSIES;  Surgeon: Shelah Lamar RAMAN, MD;  Location: [facility] ENDOSCOPY;  Service: Pulmonary;;   CONSTRICTING FINGER RING EXCISION Left    CYST REMOVAL TRUNK Left    left posterior shoulder   FIDUCIAL MARKER PLACEMENT  08/27/2022   Procedure: FIDUCIAL MARKER PLACEMENT;  Surgeon: Shelah Lamar RAMAN, MD;  Location: [facility] ENDOSCOPY;  Service: Pulmonary;;   GOLD SEED IMPLANT N/A 10/02/2022   Procedure: GOLD SEED IMPLANT;  Surgeon: Lovie Arlyss CROME, MD;  Location: [facility];  Service: Urology;  Laterality: N/A;   index finger Left    IR FACET JT INJ C/T  2ND LEVEL RIGHT W/FL/CT  01/20/2024   IR FACET JT INJ C/T  SINGLE LEVEL RIGHT  W/FL/CT  01/20/2024   IR FACET JT INJ C/T 3RD PLUS LEVEL RIGHT W/FL/CT  01/20/2024   IR THORACENTESIS ASP PLEURAL SPACE W/IMG  GUIDE  06/14/2023   Prostate needle biopsy     SPACE OAR INSTILLATION N/A 10/02/2022   Procedure: SPACE OAR INSTILLATION;  Surgeon: Lovie Arlyss CROME, MD;  Location: [facility];  Service: Urology;  Laterality: N/A;   Family History Family History  Problem Relation Age of Onset   Prostate cancer Father     Social History Social History   Tobacco Use   Smoking status: Former    Current packs/day: 0.00    Average packs/day: 0.5 packs/day for 10.0 years (5.0 ttl pk-yrs)    Types: Cigarettes    Start date: 61    Quit date: 5    Years since quitting: 44.6   Smokeless tobacco: Never  Vaping Use   Vaping status: Never Used  Substance Use Topics   Alcohol  use: Yes    Comment: rare   Drug use: Never   Allergies Sulfa antibiotics  Review of Systems Review of Systems  Respiratory:  Positive for shortness of breath and wheezing.     Physical Exam Vital Signs  I have reviewed the triage vital signs BP (!) 146/77   Pulse (!) 101   Temp (!) 97 F (36.1 C) (Temporal)   Resp (!) 23   SpO2 100%   Physical Exam Constitutional:      General: He is in acute distress.     Appearance: Normal appearance. He is ill-appearing.  HENT:     Head: Normocephalic and atraumatic.     Nose: No congestion or rhinorrhea.  Eyes:     General:        Right eye: No discharge.        Left eye: No discharge.     Extraocular Movements: Extraocular movements intact.     Pupils: Pupils are equal, round, and reactive to light.  Cardiovascular:     Rate and Rhythm: Regular rhythm. Tachycardia present.     Heart sounds: No murmur heard. Pulmonary:     Effort: No respiratory distress.     Breath sounds: Wheezing and rales present.  Abdominal:     General: There is no distension.     Tenderness: There is no abdominal tenderness.  Musculoskeletal:         General: Normal range of motion.     Cervical back: Normal range of motion.     Right lower leg: Edema present.     Left lower leg: Edema present.  Skin:    General: Skin is warm and dry.  Neurological:     General: No focal deficit present.     Mental Status: He is alert.     ED Results and Treatments Labs (all labs ordered are listed, but only abnormal results are displayed) Labs Reviewed  COMPREHENSIVE METABOLIC PANEL WITH GFR - Abnormal; Notable for the following components:      Result Value   Glucose, Bld 129 (*)    Calcium 8.4 (*)    Total Protein 6.1 (*)    Albumin 2.9 (*)    All other components within normal limits  CBC WITH DIFFERENTIAL/PLATELET - Abnormal; Notable for the following components:   RBC 3.70 (*)    Hemoglobin 10.5 (*)    HCT 33.2 (*)    RDW 16.1 (*)    Abs Immature Granulocytes 0.15 (*)    All other components within normal limits  BLOOD GAS, VENOUS - Abnormal; Notable for the following components:   pH, Ven 7.51 (*)    pCO2, Ven 32 (*)  pO2, Ven 63 (*)    Acid-Base Excess 2.9 (*)    All other components within normal limits  BRAIN NATRIURETIC PEPTIDE - Abnormal; Notable for the following components:   B Natriuretic Peptide 2,201.2 (*)    All other components within normal limits  TROPONIN I (HIGH SENSITIVITY) - Abnormal; Notable for the following components:   Troponin I (High Sensitivity) 127 (*)    All other components within normal limits  RESP PANEL BY RT-PCR (RSV, FLU A&B, COVID)  RVPGX2  TROPONIN I (HIGH SENSITIVITY)                                                                                                                          Radiology DG Chest Portable 1 View Result Date: 01/22/2024 CLINICAL DATA:  Dyspnea. EXAM: PORTABLE CHEST 1 VIEW COMPARISON:  01/17/2024 FINDINGS: Cardiac enlargement. Increasing coarse perihilar infiltrates in the lungs since prior study. This may represent developing edema or pneumonia. The known left  upper lobe mass is obscured by the parenchymal process. No pleural effusion. No pneumothorax. Surgical clips projected over the hila. Calcification of the aorta. Degenerative changes in the spine and shoulders. IMPRESSION: Developing coarse perihilar infiltrates may indicate pneumonia or edema. Electronically Signed   By: Elsie Gravely M.D.   On: 01/22/2024 21:33    Pertinent labs & imaging results that were available during my care of the patient were reviewed by me and considered in my medical decision making (see MDM for details).  Medications Ordered in ED Medications  iohexol  (OMNIPAQUE ) 350 MG/ML injection 75 mL (has no administration in time range)  LORazepam  (ATIVAN ) injection 0.5 mg (0.5 mg Intravenous Given 01/22/24 2133)  albuterol  (PROVENTIL ) (2.5 MG/3ML) 0.083% nebulizer solution (10 mg/hr Nebulization Given 01/22/24 2149)  ipratropium (ATROVENT ) nebulizer solution 1 mg (1 mg Nebulization Given 01/22/24 2137)                                                                                                                                     Procedures .Critical Care  Performed by: Albertina Dixon, MD Authorized by: Albertina Dixon, MD   Critical care provider statement:    Critical care time (minutes):  30   Critical care was necessary to treat or prevent imminent or life-threatening deterioration of the following conditions:  Respiratory failure   Critical care was time spent personally by me on the following activities:  Development of treatment plan with patient or surrogate, discussions with consultants, evaluation of patient's response to treatment, examination of patient, ordering and review of laboratory studies, ordering and review of radiographic studies, ordering and performing treatments and interventions, pulse oximetry, re-evaluation of patient's condition and review of old charts   (including critical care time)  Medical Decision Making / ED Course   This patient  presents to the ED for concern of dyspnea, this involves an extensive number of treatment options, and is a complaint that carries with it a high risk of complications and morbidity.  The differential diagnosis includes Pe, PTX, Pulmonary Edema, COPD/Asthma, ACS, CHF exacerbation, Arrhythmia,  Anemia, Sepsis, Anxiety, Viral UR  MDM: Patient seen emerged part for evaluation of shortness of breath.  Physical exam reveals an ill-appearing tachypneic patient with both wheezing and rales heard bilaterally.  1+ bilateral pitting edema.  Patient transition to BiPAP with improvement of respiratory status.  Laboratory evaluation with a hemoglobin of 10.5, pH 7.51 without significant hypercarbia, BNP is significant elevated at 2201, high sensitive troponin elevated to 127 and suspect likely demand ischemia.  ECG without evidence of gross ischemia.  Chest x-ray with perihilar infiltrates concerning for edema versus infection.  Patient received continuous albuterol  10 mg treatment with 1 mg of Atrovent .  We were able to successfully trial him off of BiPAP.  On reevaluation, patient continues to improve he is pending PE study at time of signout.  Please see provider signout for continuation of workup.  Anticipate admission.   Additional history obtained: -Additional history obtained from wife -External records from outside source obtained and reviewed including: Chart review including previous notes, labs, imaging, consultation notes   Lab Tests: -I ordered, reviewed, and interpreted labs.   The pertinent results include:   Labs Reviewed  COMPREHENSIVE METABOLIC PANEL WITH GFR - Abnormal; Notable for the following components:      Result Value   Glucose, Bld 129 (*)    Calcium 8.4 (*)    Total Protein 6.1 (*)    Albumin 2.9 (*)    All other components within normal limits  CBC WITH DIFFERENTIAL/PLATELET - Abnormal; Notable for the following components:   RBC 3.70 (*)    Hemoglobin 10.5 (*)    HCT 33.2  (*)    RDW 16.1 (*)    Abs Immature Granulocytes 0.15 (*)    All other components within normal limits  BLOOD GAS, VENOUS - Abnormal; Notable for the following components:   pH, Ven 7.51 (*)    pCO2, Ven 32 (*)    pO2, Ven 63 (*)    Acid-Base Excess 2.9 (*)    All other components within normal limits  BRAIN NATRIURETIC PEPTIDE - Abnormal; Notable for the following components:   B Natriuretic Peptide 2,201.2 (*)    All other components within normal limits  TROPONIN I (HIGH SENSITIVITY) - Abnormal; Notable for the following components:   Troponin I (High Sensitivity) 127 (*)    All other components within normal limits  RESP PANEL BY RT-PCR (RSV, FLU A&B, COVID)  RVPGX2  TROPONIN I (HIGH SENSITIVITY)      EKG   EKG Interpretation Date/Time:  Wednesday January 22 2024 21:23:21 EDT Ventricular Rate:  126 PR Interval:  154 QRS Duration:  77 QT Interval:  335 QTC Calculation: 485 R Axis:   43  Text Interpretation: Sinus tachycardia Probable left atrial enlargement Confirmed by Ambri Miltner ([id number]) on 01/22/2024 11:01:58 PM  Imaging Studies ordered: I ordered imaging studies including chest x-ray I independently visualized and interpreted imaging. I agree with the radiologist interpretation  CT PE pending   Medicines ordered and prescription drug management: Meds ordered this encounter  Medications   LORazepam  (ATIVAN ) injection 0.5 mg   albuterol  (PROVENTIL ) (2.5 MG/3ML) 0.083% nebulizer solution   ipratropium (ATROVENT ) nebulizer solution 1 mg   iohexol  (OMNIPAQUE ) 350 MG/ML injection 75 mL    -I have reviewed the patients home medicines and have made adjustments as needed  Critical interventions BiPAP, continuous albuterol , Lasix     Cardiac Monitoring: The patient was maintained on a cardiac monitor.  I personally viewed and interpreted the cardiac monitored which showed an underlying rhythm of: Sinus tachycardia  Social Determinants of Health:   Factors impacting patients care include: none   Reevaluation: After the interventions noted above, I reevaluated the patient and found that they have :improved  Co morbidities that complicate the patient evaluation  Past Medical History:  Diagnosis Date   Age-related cataract of left eye    Amblyopia, right eye    Arthritis    Cancer (HCC)    Dysuria    ED (erectile dysfunction)    Elevated PSA    Epidermal cyst    Family history of prostate cancer    Gout    Hip pain    Histoplasmosis    Hyperlipidemia    Hypermetropia, left eye    Hypertension    Impaired glucose tolerance    Joint pain    Left shoulder pain    Lower back pain    Melanocytic nevi, unspecified    Nummular dermatitis    Ocular hypertension    Palmar fascial fibromatosis    Prostate cancer screening    Regular astigmatism, right eye    Senile nuclear sclerosis       Dispostion: I considered admission for this patient, and disposition pending completion of imaging studies but do anticipate admission.     Final Clinical Impression(s) / ED Diagnoses Final diagnoses:  None     @PCDICTATION @    Albertina Dixon, MD 01/22/24 [phone]

## 2024-01-22 NOTE — Progress Notes (Signed)
 Patient weaned off BiPAP to 3L [place].  Patient tolerating well.

## 2024-01-22 NOTE — ED Triage Notes (Signed)
 PT bib ems from home c/o SOB hx of lung cancer. Per ems pt could not tolerate cpap or nonrebreather. Pt arrivewd while receiving duoneb sats 99%  Ems vitals 180/84 112 24 RR 95% ra   2g mag 125 solumedrol Duoneb .15 epi 20g Left wrist

## 2024-01-22 NOTE — ED Provider Notes (Signed)
 Acute onset of resp distress. Here with wheezing, DOE, edema, edema on xr. Trop/BNP elevated. Likely admit. Off BiPap. Also has unilateral vocal cord paralysis. Pendign CTA and admission.   Physical Exam  BP (!) 146/77   Pulse (!) 101   Temp (!) 97 F (36.1 C) (Temporal)   Resp (!) 23   SpO2 100%   Physical Exam  Procedures  Procedures  ED Course / MDM    Medical Decision Making Amount and/or Complexity of Data Reviewed Labs: ordered. Radiology: ordered.  Risk Prescription drug management. Decision regarding hospitalization.   Persistently off BiPaP. CTA negative. Appropriate for tele-admit, TRH consulted. Lasix  ordered.        Meranda Dechaine, Selinda, MD 01/24/24 [phone]

## 2024-01-23 ENCOUNTER — Other Ambulatory Visit (HOSPITAL_COMMUNITY)

## 2024-01-23 DIAGNOSIS — J9601 Acute respiratory failure with hypoxia: Secondary | ICD-10-CM | POA: Diagnosis present

## 2024-01-23 DIAGNOSIS — C7951 Secondary malignant neoplasm of bone: Secondary | ICD-10-CM | POA: Diagnosis present

## 2024-01-23 DIAGNOSIS — I2489 Other forms of acute ischemic heart disease: Secondary | ICD-10-CM | POA: Diagnosis present

## 2024-01-23 DIAGNOSIS — I509 Heart failure, unspecified: Principal | ICD-10-CM | POA: Insufficient documentation

## 2024-01-23 DIAGNOSIS — I429 Cardiomyopathy, unspecified: Secondary | ICD-10-CM | POA: Diagnosis present

## 2024-01-23 DIAGNOSIS — J189 Pneumonia, unspecified organism: Principal | ICD-10-CM

## 2024-01-23 DIAGNOSIS — R6510 Systemic inflammatory response syndrome (SIRS) of non-infectious origin without acute organ dysfunction: Secondary | ICD-10-CM | POA: Diagnosis present

## 2024-01-23 DIAGNOSIS — K219 Gastro-esophageal reflux disease without esophagitis: Secondary | ICD-10-CM | POA: Diagnosis present

## 2024-01-23 DIAGNOSIS — R7989 Other specified abnormal findings of blood chemistry: Secondary | ICD-10-CM

## 2024-01-23 DIAGNOSIS — B9620 Unspecified Escherichia coli [E. coli] as the cause of diseases classified elsewhere: Secondary | ICD-10-CM | POA: Diagnosis present

## 2024-01-23 DIAGNOSIS — Z515 Encounter for palliative care: Secondary | ICD-10-CM | POA: Diagnosis not present

## 2024-01-23 DIAGNOSIS — E785 Hyperlipidemia, unspecified: Secondary | ICD-10-CM | POA: Diagnosis present

## 2024-01-23 DIAGNOSIS — L89320 Pressure ulcer of left buttock, unstageable: Secondary | ICD-10-CM | POA: Diagnosis present

## 2024-01-23 DIAGNOSIS — I5021 Acute systolic (congestive) heart failure: Secondary | ICD-10-CM | POA: Diagnosis present

## 2024-01-23 DIAGNOSIS — I2510 Atherosclerotic heart disease of native coronary artery without angina pectoris: Secondary | ICD-10-CM | POA: Diagnosis present

## 2024-01-23 DIAGNOSIS — R42 Dizziness and giddiness: Secondary | ICD-10-CM | POA: Diagnosis not present

## 2024-01-23 DIAGNOSIS — D696 Thrombocytopenia, unspecified: Secondary | ICD-10-CM | POA: Diagnosis present

## 2024-01-23 DIAGNOSIS — Z7189 Other specified counseling: Secondary | ICD-10-CM | POA: Diagnosis not present

## 2024-01-23 DIAGNOSIS — Z79899 Other long term (current) drug therapy: Secondary | ICD-10-CM | POA: Diagnosis not present

## 2024-01-23 DIAGNOSIS — M109 Gout, unspecified: Secondary | ICD-10-CM | POA: Diagnosis present

## 2024-01-23 DIAGNOSIS — D509 Iron deficiency anemia, unspecified: Secondary | ICD-10-CM | POA: Diagnosis present

## 2024-01-23 DIAGNOSIS — Z66 Do not resuscitate: Secondary | ICD-10-CM | POA: Diagnosis present

## 2024-01-23 DIAGNOSIS — Z87891 Personal history of nicotine dependence: Secondary | ICD-10-CM | POA: Diagnosis not present

## 2024-01-23 DIAGNOSIS — L89310 Pressure ulcer of right buttock, unstageable: Secondary | ICD-10-CM | POA: Diagnosis present

## 2024-01-23 DIAGNOSIS — D6481 Anemia due to antineoplastic chemotherapy: Secondary | ICD-10-CM | POA: Diagnosis not present

## 2024-01-23 DIAGNOSIS — C3492 Malignant neoplasm of unspecified part of left bronchus or lung: Secondary | ICD-10-CM | POA: Diagnosis present

## 2024-01-23 DIAGNOSIS — T451X5A Adverse effect of antineoplastic and immunosuppressive drugs, initial encounter: Secondary | ICD-10-CM | POA: Diagnosis not present

## 2024-01-23 DIAGNOSIS — I951 Orthostatic hypotension: Secondary | ICD-10-CM | POA: Diagnosis present

## 2024-01-23 DIAGNOSIS — E876 Hypokalemia: Secondary | ICD-10-CM | POA: Diagnosis present

## 2024-01-23 DIAGNOSIS — R7881 Bacteremia: Secondary | ICD-10-CM | POA: Diagnosis present

## 2024-01-23 DIAGNOSIS — I110 Hypertensive heart disease with heart failure: Secondary | ICD-10-CM | POA: Diagnosis present

## 2024-01-23 LAB — RESPIRATORY PANEL BY PCR

## 2024-01-23 LAB — BLOOD CULTURE ID PANEL (REFLEXED) - BCID2

## 2024-01-23 LAB — PROCALCITONIN: Procalcitonin: 0.47 ng/mL

## 2024-01-23 LAB — CBC
HCT: 32.3 % — ABNORMAL LOW (ref 39.0–52.0)
Hemoglobin: 10.3 g/dL — ABNORMAL LOW (ref 13.0–17.0)
MCH: 28.4 pg (ref 26.0–34.0)
MCHC: 31.9 g/dL (ref 30.0–36.0)
MCV: 89 fL (ref 80.0–100.0)
Platelets: 140 K/uL — ABNORMAL LOW (ref 150–400)
RBC: 3.63 MIL/uL — ABNORMAL LOW (ref 4.22–5.81)
RDW: 15.9 % — ABNORMAL HIGH (ref 11.5–15.5)
WBC: 7.9 K/uL (ref 4.0–10.5)
nRBC: 0 % (ref 0.0–0.2)

## 2024-01-23 LAB — TROPONIN I (HIGH SENSITIVITY): Troponin I (High Sensitivity): 124 ng/L (ref <18)

## 2024-01-23 LAB — BASIC METABOLIC PANEL WITH GFR
Anion gap: 13 (ref 5–15)
BUN: 12 mg/dL (ref 8–23)
CO2: 24 mmol/L (ref 22–32)
Calcium: 8.5 mg/dL — ABNORMAL LOW (ref 8.9–10.3)
Chloride: 104 mmol/L (ref 98–111)
Creatinine, Ser: 0.89 mg/dL (ref 0.61–1.24)
GFR, Estimated: >60 mL/min (ref >60)
Glucose, Bld: 211 mg/dL — ABNORMAL HIGH (ref 70–99)
Potassium: 3.1 mmol/L — ABNORMAL LOW (ref 3.5–5.1)
Sodium: 141 mmol/L (ref 135–145)

## 2024-01-23 LAB — LACTIC ACID, PLASMA
Lactic Acid, Venous: 2.7 mmol/L (ref 0.5–1.9)
Lactic Acid, Venous: 2 mmol/L (ref 0.5–1.9)

## 2024-01-23 LAB — STREP PNEUMONIAE URINARY ANTIGEN: Strep Pneumo Urinary Antigen: NEGATIVE

## 2024-01-23 MED ORDER — ENOXAPARIN SODIUM 40 MG/0.4ML IJ SOSY
40.0000 mg | PREFILLED_SYRINGE | INTRAMUSCULAR | Status: DC
  Administered 2024-01-23 – 2024-01-29 (×6): 40 mg via SUBCUTANEOUS
  Filled 2024-01-23 (×7): qty 0.4

## 2024-01-23 MED ORDER — FUROSEMIDE 10 MG/ML IJ SOLN
40.0000 mg | Freq: Once | INTRAMUSCULAR | Status: AC
  Administered 2024-01-23: 40 mg via INTRAVENOUS
  Filled 2024-01-23: qty 4

## 2024-01-23 MED ORDER — IPRATROPIUM-ALBUTEROL 0.5-2.5 (3) MG/3ML IN SOLN
3.0000 mL | Freq: Four times a day (QID) | RESPIRATORY_TRACT | Status: DC | PRN
  Filled 2024-01-23 (×4): qty 3

## 2024-01-23 MED ORDER — ACETAMINOPHEN 650 MG RE SUPP: 650.0000 mg | Freq: Four times a day (QID) | RECTAL | Status: DC | PRN

## 2024-01-23 MED ORDER — SODIUM CHLORIDE 0.9 % IV SOLN
1.0000 g | INTRAVENOUS | Status: DC
  Administered 2024-01-23: 1 g via INTRAVENOUS
  Filled 2024-01-23: qty 10

## 2024-01-23 MED ORDER — GERHARDT'S BUTT CREAM
TOPICAL_CREAM | Freq: Two times a day (BID) | CUTANEOUS | Status: DC
  Filled 2024-01-23: qty 20
  Filled 2024-01-23: qty 60
  Filled 2024-01-23: qty 20

## 2024-01-23 MED ORDER — ACETAMINOPHEN 325 MG PO TABS
650.0000 mg | ORAL_TABLET | Freq: Four times a day (QID) | ORAL | Status: DC | PRN
  Administered 2024-01-29 – 2024-01-30 (×3): 650 mg via ORAL
  Filled 2024-01-23 (×8): qty 2

## 2024-01-23 MED ORDER — SODIUM CHLORIDE 0.9 % IV SOLN: 2.0000 g | INTRAVENOUS | Status: DC

## 2024-01-23 MED ORDER — FUROSEMIDE 10 MG/ML IJ SOLN
40.0000 mg | Freq: Two times a day (BID) | INTRAMUSCULAR | Status: DC
  Administered 2024-01-23 – 2024-01-24 (×3): 40 mg via INTRAVENOUS
  Filled 2024-01-23 (×3): qty 4

## 2024-01-23 MED ORDER — SODIUM CHLORIDE 0.9 % IV SOLN
100.0000 mg | Freq: Two times a day (BID) | INTRAVENOUS | Status: DC
  Administered 2024-01-23 – 2024-01-24 (×3): 100 mg via INTRAVENOUS
  Filled 2024-01-23 (×4): qty 100

## 2024-01-23 MED ORDER — SODIUM CHLORIDE 0.9 % IV SOLN
1.0000 g | Freq: Three times a day (TID) | INTRAVENOUS | Status: DC
  Administered 2024-01-23 – 2024-01-24 (×2): 1 g via INTRAVENOUS
  Filled 2024-01-23 (×3): qty 20

## 2024-01-23 NOTE — Evaluation (Signed)
 Physical Therapy Evaluation Patient Details Name: Chad Moreno MRN: [id number] DOB: [birth year] Today's Date: 01/23/2024  History of Present Illness  Pt is a 83 y.o. M who presents 01/22/2024 for evaluation of acute onset shortness of breath/respiratory distress. Imaging was concerning for potential patchy groundglass and airspace opacities worrisome for infection versus inflammation, also evidence of fluid overload.  He was placed on antibiotics, diuretics, and admitted to the hospital. Significant PMH: metastatic lung cancer currently on immunotherapy, chronic thrombocytopenia, HTN, HLD, recent bleed in July 2025 for small bowel obstruction, GI bleed from cecal colitis/diverticulosis, ESBL UTI and AKI.  Clinical Impression  Pt admitted with above. Pt appears to be close to his functional baseline. Pt with acute diarrhea and assisted afterwards with posterior peri care per request. Pt ambulating room distances with no assistive device and supervision; SpO2 97% on RA. Pt declining hallway ambulation at this time. Pt reports he performs generalized walking program at home in addition to exercise routine 4x/day.  Provided with written HEP for strengthening and balance. No follow up PT anticipated. Will continue to follow acutely.      If plan is discharge home, recommend the following: A little help with bathing/dressing/bathroom;Assistance with cooking/housework;Assist for transportation;Help with stairs or ramp for entrance   Can travel by private vehicle        Equipment Recommendations None recommended by PT  Recommendations for Other Services       Functional Status Assessment Patient has had a recent decline in their functional status and demonstrates the ability to make significant improvements in function in a reasonable and predictable amount of time.     Precautions / Restrictions Precautions Precautions: Fall;Other (comment) Precaution/Restrictions Comments: Pt declines gait  belt Restrictions Weight Bearing Restrictions Per Provider Order: No      Mobility  Bed Mobility Overal bed mobility: Needs Assistance Bed Mobility: Supine to Sit, Sit to Supine     Supine to sit: Min assist Sit to supine: Min assist   General bed mobility comments: Pt reports difficulty with bed mobility due to telemetry box and different set up from home, requesting assist to initiate BLE negotiation to edge of bed, trunk to upright    Transfers Overall transfer level: Needs assistance Equipment used: None Transfers: Sit to/from Stand Sit to Stand: Contact guard assist                Ambulation/Gait Ambulation/Gait assistance: Supervision Gait Distance (Feet): 40 Feet Assistive device: None Gait Pattern/deviations: Step-through pattern, Decreased stride length       General Gait Details: Supervision for safety, ambulating room distances with no gross imbalance  Stairs            Wheelchair Mobility     Tilt Bed    Modified Rankin (Stroke Patients Only)       Balance Overall balance assessment: Needs assistance Sitting-balance support: Feet supported Sitting balance-Leahy Scale: Good     Standing balance support: No upper extremity supported, During functional activity Standing balance-Leahy Scale: Fair                               Pertinent Vitals/Pain Pain Assessment Pain Assessment: No/denies pain    Home Living Family/patient expects to be discharged to:: Private residence Living Arrangements: Spouse/significant other Available Help at Discharge: Family Type of Home: House Home Access: Stairs to enter Entrance Stairs-Rails: Right Entrance Stairs-Number of Steps: 5 Alternate Level Stairs-Number of Steps: flight Home  Layout: Two level Home Equipment: Agricultural consultant (2 wheels)      Prior Function Prior Level of Function : Independent/Modified Independent                     Extremity/Trunk Assessment    Upper Extremity Assessment Upper Extremity Assessment: Overall WFL for tasks assessed    Lower Extremity Assessment Lower Extremity Assessment: Overall WFL for tasks assessed    Cervical / Trunk Assessment Cervical / Trunk Assessment: Normal  Communication   Communication Communication: Impaired Factors Affecting Communication: Other (comment) (reduced vocal intensity)    Cognition Arousal: Alert Behavior During Therapy: WFL for tasks assessed/performed   PT - Cognitive impairments: No apparent impairments                         Following commands: Intact       Cueing Cueing Techniques: Verbal cues     General Comments      Exercises     Assessment/Plan    PT Assessment Patient needs continued PT services  PT Problem List Decreased activity tolerance;Decreased balance;Decreased mobility       PT Treatment Interventions Gait training;DME instruction;Balance training;Functional mobility training;Therapeutic activities;Therapeutic exercise;Patient/family education;Stair training    PT Goals (Current goals can be found in the Care Plan section)  Acute Rehab PT Goals Patient Stated Goal: to return home on Friday or Saturday PT Goal Formulation: With patient Time For Goal Achievement: 02/06/24 Potential to Achieve Goals: Good    Frequency Min 1X/week     Co-evaluation               AM-PAC PT 6 Clicks Mobility  Outcome Measure Help needed turning from your back to your side while in a flat bed without using bedrails?: None Help needed moving from lying on your back to sitting on the side of a flat bed without using bedrails?: A Little Help needed moving to and from a bed to a chair (including a wheelchair)?: A Little Help needed standing up from a chair using your arms (e.g., wheelchair or bedside chair)?: A Little Help needed to walk in hospital room?: A Little Help needed climbing 3-5 steps with a railing? : A Little 6 Click Score: 19     End of Session   Activity Tolerance: Patient tolerated treatment well Patient left: in bed;with call bell/phone within reach;with bed alarm set   PT Visit Diagnosis: Difficulty in walking, not elsewhere classified (R26.2);Unsteadiness on feet (R26.81)    Time: 8681-8652 PT Time Calculation (min) (ACUTE ONLY): 29 min   Charges:   PT Evaluation $PT Eval Low Complexity: 1 Low PT Treatments $Therapeutic Activity: 8-22 mins PT General Charges $$ ACUTE PT VISIT: 1 Visit         Aleck Daring, PT, DPT Acute Rehabilitation Services Office [phone]   Alayne ONEIDA Daring 01/23/2024, 2:39 PM

## 2024-01-23 NOTE — Plan of Care (Signed)
  Problem: Activity: Goal: Risk for activity intolerance will decrease Outcome: Progressing   Problem: Nutrition: Goal: Adequate nutrition will be maintained Outcome: Progressing   Problem: Coping: Goal: Level of anxiety will decrease Outcome: Progressing   Problem: Coping: Goal: Level of anxiety will decrease Outcome: Progressing   Problem: Pain Managment: Goal: General experience of comfort will improve and/or be controlled Outcome: Progressing   Problem: Safety: Goal: Ability to remain free from injury will improve Outcome: Progressing   Problem: Skin Integrity: Goal: Risk for impaired skin integrity will decrease Outcome: Progressing   Problem: Activity: Goal: Ability to tolerate increased activity will improve Outcome: Progressing   Problem: Clinical Measurements: Goal: Ability to maintain a body temperature in the normal range will improve Outcome: Progressing   Problem: Respiratory: Goal: Ability to maintain adequate ventilation will improve Outcome: Progressing Goal: Ability to maintain a clear airway will improve Outcome: Progressing

## 2024-01-23 NOTE — Plan of Care (Signed)

## 2024-01-23 NOTE — ED Notes (Signed)
 RN informed 3E IP NS pt is on his way up

## 2024-01-23 NOTE — Progress Notes (Signed)
 No charge no  Patient seen and examined this morning, admitted overnight, H&P reviewed and I agree with the assessment and plan  83 year old male with metastatic lung cancer currently on immunotherapy, chronic thrombocytopenia, HTN, HLD, recent bleed in July 2025 for small bowel obstruction, GI bleed from cecal colitis/diverticulosis, ESBL UTI and AKI comes into the hospital with acute onset of shortness of breath for the day prior to admission.  He was wheezing, was tachycardic, tachypneic, and hypoxic requiring BiPAP.  Imaging was concerning for potential patchy groundglass and airspace opacities worrisome for infection versus inflammation, also evidence of fluid overload.  He was placed on antibiotics, diuretics, and admitted to the hospital.  When I evaluated the patient this morning he was already feeling better, stable on 3 L nasal cannula and feeling much better.  He is asking about when he can go home.  For now I would continue current treatment plan with diuresis, antibiotics, monitor cultures.  He was afebrile, has no leukocytosis and no productive cough and will consider discontinuation of antibiotics at 48 hours if his cultures remain negative  Ila Landowski M. Trixie, MD, PhD [facility]  Between 7 am - 7 pm you can contact me via Amion (for emergencies) or Securechat (non urgent matters).  I am not available 7 pm - 7 am, please contact night coverage MD/APP via Amion

## 2024-01-23 NOTE — Progress Notes (Signed)
 Heart Failure Navigator Progress Note  Assessed for Heart & Vascular TOC clinic readiness.  Patient does not meet criteria due to per MD note patient with Metastatic lung Cancer. No HF TOC. .   Navigator will sign off at this time.   Stephane Haddock, BSN, Scientist, clinical (histocompatibility and immunogenetics) Only

## 2024-01-23 NOTE — ED Notes (Signed)
 Pt given cup of ice water; reminded that we are trying to limit his fluid intake in order to help his breathing.

## 2024-01-23 NOTE — ED Notes (Signed)
 RN secured message Dr. Trixie to place an order for  Nystatin (GERHARDT'S BUTT CREAM) CREAM per pt request

## 2024-01-23 NOTE — H&P (Signed)
 History and Physical    Chad Moreno FMW:[id number] DOB: 1940-10-11 DOA: 01/22/2024  PCP: [name], MD  Patient coming from: Home  Chief Complaint: SOB  HPI: Chad Moreno is a 83 y.o. male with medical history significant of metastatic small cell lung cancer status post SBRT and currently on immunotherapy, chronic thrombocytopenia, hypertension, hyperlipidemia, gout, GERD. Recent hospital admission 7/7-7/16 for SBO, GI bleed from cecal colitis/diverticulosis, ESBL UTI, and AKI. Patient presents to the ED today via EMS for evaluation of acute onset shortness of breath/respiratory distress.  He was given IV mag 2 g, Solu-Medrol  125 mg, DuoNeb, and epinephrine by EMS.  Upon arrival, patient noted to be tachycardic, tachypneic with accessory muscle use.  He had wheezing and rales on exam.  Initially required BiPAP but later weaned down to 3 L [place].  Labs showing no leukocytosis, hemoglobin 10.5 (stable), platelet count normal, troponin 127> 124, BNP 2201, COVID/influenza/RSV PCR negative, VBG with pH 7.51 and pCO2 32.  CTA chest showing no PE but small bilateral pleural effusions.  In addition, there are patchy groundglass and airspace opacities centrally in the right upper lobe and left upper lobe, new from prior, worrisome for infection or inflammation.  Showing central peribronchial wall thickening diffusely.  Patient was given albuterol , ipratropium, Ativan , and IV Lasix  40 mg in the ED.  [facility] called to admit.  Patient states he has been coughing for the past few days and yesterday afternoon started feeling short of breath.  He reports improvement of his dyspnea after receiving treatments in the ED.  Denies fevers or chest pain.  Reports history of cigarette smoking many years ago and quit in 1982.  He is not aware of having history of asthma or COPD and does not use any inhalers at home.  He is not on home oxygen.  He reports having diarrhea a week ago which has since resolved and last bowel  movement was yesterday afternoon with well-formed stool.  Denies nausea, vomiting, or abdominal pain.  No other complaints.  Review of Systems:  Review of Systems  All other systems reviewed and are negative.   Past Medical History:  Diagnosis Date   Age-related cataract of left eye    Amblyopia, right eye    Arthritis    Cancer (HCC)    Dysuria    ED (erectile dysfunction)    Elevated PSA    Epidermal cyst    Family history of prostate cancer    Gout    Hip pain    Histoplasmosis    Hyperlipidemia    Hypermetropia, left eye    Hypertension    Impaired glucose tolerance    Joint pain    Left shoulder pain    Lower back pain    Melanocytic nevi, unspecified    Nummular dermatitis    Ocular hypertension    Palmar fascial fibromatosis    Prostate cancer screening    Regular astigmatism, right eye    Senile nuclear sclerosis     Past Surgical History:  Procedure Laterality Date   BRONCHIAL BIOPSY  08/27/2022   Procedure: BRONCHIAL BIOPSIES;  Surgeon: Shelah Lamar RAMAN, MD;  Location: [facility] ENDOSCOPY;  Service: Pulmonary;;   BRONCHIAL BRUSHINGS  08/27/2022   Procedure: BRONCHIAL BRUSHINGS;  Surgeon: Shelah Lamar RAMAN, MD;  Location: [facility] ENDOSCOPY;  Service: Pulmonary;;   BRONCHIAL NEEDLE ASPIRATION BIOPSY  08/27/2022   Procedure: BRONCHIAL NEEDLE ASPIRATION BIOPSIES;  Surgeon: Shelah Lamar RAMAN, MD;  Location: [facility] ENDOSCOPY;  Service: Pulmonary;;  CONSTRICTING FINGER RING EXCISION Left    CYST REMOVAL TRUNK Left    left posterior shoulder   FIDUCIAL MARKER PLACEMENT  08/27/2022   Procedure: FIDUCIAL MARKER PLACEMENT;  Surgeon: Shelah Lamar RAMAN, MD;  Location: [facility] ENDOSCOPY;  Service: Pulmonary;;   GOLD SEED IMPLANT N/A 10/02/2022   Procedure: GOLD SEED IMPLANT;  Surgeon: Lovie Arlyss CROME, MD;  Location: [facility];  Service: Urology;  Laterality: N/A;   index finger Left    IR FACET JT INJ C/T  2ND LEVEL RIGHT W/FL/CT  01/20/2024   IR FACET JT INJ C/T  SINGLE LEVEL RIGHT  W/FL/CT  01/20/2024   IR FACET JT INJ C/T 3RD PLUS LEVEL RIGHT W/FL/CT  01/20/2024   IR THORACENTESIS ASP PLEURAL SPACE W/IMG GUIDE  06/14/2023   Prostate needle biopsy     SPACE OAR INSTILLATION N/A 10/02/2022   Procedure: SPACE OAR INSTILLATION;  Surgeon: Lovie Arlyss CROME, MD;  Location: [facility];  Service: Urology;  Laterality: N/A;     reports that he quit smoking about 44 years ago. His smoking use included cigarettes. He started smoking about 54 years ago. He has a 5 pack-year smoking history. He has never used smokeless tobacco. He reports current alcohol  use. He reports that he does not use drugs.  Allergies  Allergen Reactions   Sulfa Antibiotics Anaphylaxis    Family History  Problem Relation Age of Onset   Prostate cancer Father     Prior to Admission medications   Medication Sig Start Date End Date Taking? Authorizing Provider  Ascorbic Acid (VITAMIN C PO) Take 1 tablet by mouth daily with breakfast.    [provider]  aspirin EC 81 MG tablet Take 81 mg by mouth in the morning.    [provider]  B Complex Vitamins (VITAMIN-B COMPLEX) TABS Take 1 tablet by mouth daily with breakfast.    [provider]  binimetinib  (MEKTOVI ) 15 MG tablet Take 3 tablets (45 mg total) by mouth 2 (two) times daily. 01/10/24   [name] [name], MD  calcium carbonate (OSCAL) 1500 (600 Ca) MG TABS tablet Take 1,200 mg by mouth 2 (two) times daily with a meal.    [provider]  celecoxib  (CELEBREX ) 200 MG capsule Take 1 capsule (200 mg total) by mouth 2 (two) times daily. 12/03/23   Pickenpack-Cousar, Athena N, NP  Cholecalciferol (VITAMIN D3) 50 MCG (2000 UT) TABS Take 2,000 Units by mouth daily with breakfast.    [provider]  cyanocobalamin (VITAMIN B12) 1000 MCG tablet Take 1,000 mcg by mouth daily.    [provider]  encorafenib  (BRAFTOVI ) 75 MG capsule Take 6 capsules (450 mg total) by mouth daily. 01/10/24   [name] [name], MD  feeding supplement, GLUCERNA SHAKE, (GLUCERNA SHAKE) LIQD Take 237 mLs by mouth 2 (two) times daily between meals. Patient taking differently: Take 237 mLs by mouth 2 (two) times daily between meals. Taking premium shake 01/08/24   Cherlyn Labella, MD  ferrous sulfate  325 (65 FE) MG EC tablet Take 1 tablet (325 mg total) by mouth daily as needed. Patient taking differently: Take 325 mg by mouth every other day. 12/03/23   Pickenpack-Cousar, Athena N, NP  loperamide  (IMODIUM  A-D) 2 MG tablet Take 1 tablet (2 mg total) by mouth 4 (four) times daily as needed for diarrhea or loose stools. 01/17/24   Mannie Fairy DASEN, DO  Nystatin (GERHARDT'S BUTT CREAM) CREA Apply 1 Application topically 4 (four) times daily. 01/08/24  Cherlyn Labella, MD  Omega-3 Fatty Acids (FISH OIL PO) Take 1 capsule by mouth daily with breakfast.    [provider]  ondansetron  (ZOFRAN -ODT) 4 MG disintegrating tablet Take 1 tablet (4 mg total) by mouth every 8 (eight) hours as needed for nausea or vomiting. 01/17/24   Mannie Pac T, DO  pantoprazole  (PROTONIX ) 40 MG tablet Take 1 tablet (40 mg total) by mouth 2 (two) times daily. 01/08/24 02/07/24  Akula, Vijaya, MD  POTASSIUM PO Take 1 tablet by mouth daily with breakfast.    [provider]  Probiotic Product (PROBIOTIC PEARLS ADVANTAGE PO) Take 1 capsule by mouth in the morning.    [provider]  silodosin  (RAPAFLO ) 8 MG CAPS capsule Take 8 mg by mouth daily. 07/04/23   [provider]    Physical Exam: Vitals:   01/23/24 0045 01/23/24 0100 01/23/24 0130 01/23/24 0200  BP: 136/88 129/81 133/83 127/75  Pulse: 100 (!) 109 98 92  Resp: (!) 28 20 18  (!) 29  Temp:      TempSrc:      SpO2: 97% 99% 98% 98%    Physical Exam Vitals reviewed.  Constitutional:      General: He is not in acute distress. HENT:     Head: Normocephalic and atraumatic.  Eyes:     Extraocular Movements: Extraocular movements intact.  Cardiovascular:      Rate and Rhythm: Normal rate and regular rhythm.     Pulses: Normal pulses.  Pulmonary:     Breath sounds: No wheezing or rales.     Comments: Mildly tachypneic Abdominal:     General: Bowel sounds are normal. There is no distension.     Palpations: Abdomen is soft.     Tenderness: There is no abdominal tenderness. There is no guarding.  Musculoskeletal:     Cervical back: Normal range of motion.     Right lower leg: Edema present.     Left lower leg: Edema present.     Comments: 2+ pitting edema of bilateral lower extremities  Skin:    General: Skin is warm and dry.  Neurological:     General: No focal deficit present.     Mental Status: He is alert and oriented to person, place, and time.     Labs on Admission: I have personally reviewed following labs and imaging studies  CBC: Recent Labs  Lab 01/17/24 1020 01/20/24 1003 01/22/24 2125  WBC 6.8 5.3 7.8  NEUTROABS  --   --  5.5  HGB 9.8* 9.9* 10.5*  HCT 29.4* 30.3* 33.2*  MCV 86.0 87.8 89.7  PLT 105* 106* 165   Basic Metabolic Panel: Recent Labs  Lab 01/17/24 1020 01/22/24 2125  NA 139 141  K 3.4* 3.7  CL 103 107  CO2 22 22  GLUCOSE 155* 129*  BUN 29* 15  CREATININE 1.21 0.81  CALCIUM 8.8* 8.4*  MG 1.8  --    GFR: Estimated Creatinine Clearance: 79.5 mL/min (by C-G formula based on SCr of 0.81 mg/dL). Liver Function Tests: Recent Labs  Lab 01/17/24 1020 01/22/24 2125  AST 35 39  ALT 29 30  ALKPHOS 74 60  BILITOT 0.6 1.0  PROT 6.3* 6.1*  ALBUMIN 3.3* 2.9*   Recent Labs  Lab 01/17/24 1020  LIPASE 27   No results for input(s): AMMONIA in the last 168 hours. Coagulation Profile: Recent Labs  Lab 01/20/24 1003  INR 1.1   Cardiac Enzymes: No results for input(s): CKTOTAL, CKMB, CKMBINDEX,  TROPONINI in the last 168 hours. BNP (last 3 results) No results for input(s): PROBNP in the last 8760 hours. HbA1C: No results for input(s): HGBA1C in the last 72 hours. CBG: No  results for input(s): GLUCAP in the last 168 hours. Lipid Profile: No results for input(s): CHOL, HDL, LDLCALC, TRIG, CHOLHDL, LDLDIRECT in the last 72 hours. Thyroid  Function Tests: No results for input(s): TSH, T4TOTAL, FREET4, T3FREE, THYROIDAB in the last 72 hours. Anemia Panel: No results for input(s): VITAMINB12, FOLATE, FERRITIN, TIBC, IRON , RETICCTPCT in the last 72 hours. Urine analysis:    Component Value Date/Time   COLORURINE YELLOW 01/03/2024 1621   APPEARANCEUR HAZY (A) 01/03/2024 1621   LABSPEC 1.038 (H) 01/03/2024 1621   PHURINE 5.0 01/03/2024 1621   GLUCOSEU NEGATIVE 01/03/2024 1621   HGBUR SMALL (A) 01/03/2024 1621   BILIRUBINUR NEGATIVE 01/03/2024 1621   KETONESUR 20 (A) 01/03/2024 1621   PROTEINUR 30 (A) 01/03/2024 1621   NITRITE POSITIVE (A) 01/03/2024 1621   LEUKOCYTESUR LARGE (A) 01/03/2024 1621    Radiological Exams on Admission: CT Angio Chest PE W and/or Wo Contrast Result Date: 01/22/2024 CLINICAL DATA:  High probability for PE. History of non-small cell lung cancer. EXAM: CT ANGIOGRAPHY CHEST WITH CONTRAST TECHNIQUE: Multidetector CT imaging of the chest was performed using the standard protocol during bolus administration of intravenous contrast. Multiplanar CT image reconstructions and MIPs were obtained to evaluate the vascular anatomy. RADIATION DOSE REDUCTION: This exam was performed according to the departmental dose-optimization program which includes automated exposure control, adjustment of the mA and/or kV according to patient size and/or use of iterative reconstruction technique. CONTRAST:  75mL OMNIPAQUE  IOHEXOL  350 MG/ML SOLN COMPARISON:  CT angiogram chest 12/30/2023. FINDINGS: Cardiovascular: Satisfactory opacification of the pulmonary arteries to the segmental level. No evidence of pulmonary embolism. Normal heart size. No pericardial effusion. There are atherosclerotic calcifications of the aorta.  Mediastinum/Nodes: No enlarged mediastinal, hilar, or axillary lymph nodes. Thyroid  gland, trachea, and esophagus demonstrate no significant findings. Lungs/Pleura: Small bilateral pleural effusions, left greater than right, has increased from prior. There is atelectasis in the bilateral lower lobes. There is central peribronchial wall thickening diffusely. Additional patchy ground-glass and airspace opacities are seen centrally in the right upper lobe and left upper lobe, new from prior. Focal masslike densities in the left upper lobe and right lower lobe with fiduciary markers appear grossly unchanged. There is no evidence for pneumothorax. Trachea and central airways are patent. Upper Abdomen: No acute abnormality. Musculoskeletal: No chest wall abnormality. No acute or significant osseous findings. Review of the MIP images confirms the above findings. IMPRESSION: 1. No evidence for pulmonary embolism. 2. Small bilateral pleural effusions, left greater than right, have increased from prior. 3. Patchy ground-glass and airspace opacities centrally in the right upper lobe and left upper lobe, new from prior, worrisome for infection or inflammation. 4. Central peribronchial wall thickening diffusely. 5. Stable masslike densities in the left upper lobe and right lower lobe with fiduciary markers. 6. Aortic atherosclerosis. Aortic Atherosclerosis (ICD10-I70.0). Electronically Signed   By: Greig Pique M.D.   On: 01/22/2024 23:19   DG Chest Portable 1 View Result Date: 01/22/2024 CLINICAL DATA:  Dyspnea. EXAM: PORTABLE CHEST 1 VIEW COMPARISON:  01/17/2024 FINDINGS: Cardiac enlargement. Increasing coarse perihilar infiltrates in the lungs since prior study. This may represent developing edema or pneumonia. The known left upper lobe mass is obscured by the parenchymal process. No pleural effusion. No pneumothorax. Surgical clips projected over the hila. Calcification of the aorta.  Degenerative changes in the spine and  shoulders. IMPRESSION: Developing coarse perihilar infiltrates may indicate pneumonia or edema. Electronically Signed   By: Elsie Gravely M.D.   On: 01/22/2024 21:33    EKG: Independently reviewed. Sinus tachycardia, QTc 485, and no STEMI.   Assessment and Plan  Possible CAP CT showing patchy groundglass and airspace opacities centrally in the right upper lobe and left upper lobe, new from prior, worrisome for infection or inflammation.  ?Bacterial pneumonia given no leukocytosis.  COVID/influenza/RSV PCR negative.  Blood cultures ordered and will start antibiotics including ceftriaxone  and doxycycline  (avoiding macrolides due to slight QT prolongation on EKG).  Check procalcitonin level, MRSA PCR screen, strep pneumo/Legionella urinary antigens, sputum Gram stain and culture.  Full RVP ordered.  Acute CHF Patient is not on diuretics.  Appears volume overloaded on exam with peripheral edema.  BNP 2201.  CT showing small bilateral pleural effusions. Last echo done in January 2025 showing EF 55%, grade 1 diastolic dysfunction, trivial aortic regurgitation.  Continue diuresis with IV Lasix  40 mg twice daily.  Monitor intake and output, daily weights, and renal function.  Repeat echocardiogram ordered.  Acute hypoxemic respiratory failure In the setting of problems listed above.  Patient received IV mag, Solu-Medrol , epinephrine, and bronchodilator treatments by EMS and was given additional bronchodilators in the ED.  No longer wheezing at this time.  No documented history of asthma or COPD.  He is a former smoker.  CT showing central peribronchial wall thickening diffusely.  Initially required BiPAP but now weaned down to 3 L [place].  No evidence of hypercarbia on blood gas.  Continue supplemental oxygen, wean as tolerated.  DuoNeb as needed.  SIRS vs possible sepsis Met SIRS criteria at the time of presentation with tachycardia and tachypnea.  Imaging findings concerning for possible pneumonia.   Tachycardia has resolved and tachypnea improved.  Not hypotensive.  Check lactate.  Follow-up blood cultures.  Troponin elevation Likely secondary to demand ischemia rather than ACS.  EKG without STEMI and troponin 127> 124.  Patient is not endorsing chest pain.  Metastatic small cell lung cancer: Status post SBRT and currently on immunotherapy. Hypertension: Currently normotensive. Hyperlipidemia Gout GERD Pharmacy med rec pending.  DVT prophylaxis: Lovenox  Code Status: Full Code (discussed with the patient) Family Communication: Wife at bedside. Level of care: Progressive Care Unit Admission status: It is my clinical opinion that admission to INPATIENT is reasonable and necessary because of the expectation that this patient will require hospital care that crosses at least 2 midnights to treat this condition based on the medical complexity of the problems presented.  Given the aforementioned information, the predictability of an adverse outcome is felt to be significant.  Editha Ram MD [facility]  If 7PM-7AM, please contact night-coverage [email or web address]  01/23/2024, 2:41 AM

## 2024-01-23 NOTE — ED Notes (Signed)
 Pt bedsheets changed and pt changed into a clean gown. 29mm condom cath placed

## 2024-01-23 NOTE — ED Notes (Signed)
 Pt had BM. Pt and spouse stated to discard shorts and underwear in trash d/t being soiled. RN provided pericare, condom cath, and new linens.

## 2024-01-23 NOTE — Progress Notes (Signed)
 PHARMACY - PHYSICIAN COMMUNICATION CRITICAL VALUE ALERT - BLOOD CULTURE IDENTIFICATION (BCID)  Chad Moreno is an 83 y.o. male who presented to [facility] on 01/22/2024 with a chief complaint of SOB  Assessment:  Blood cultures with GNR in 1/4 bottles and BCID with Ecoli w/ CTX-M detected  Name of physician (or Provider) Contacted: Dr. Trixie  Current antibiotics: Rocephin , doxycycline   Changes to prescribed antibiotics recommended: Meropenem  2gm IV q8h Recommendations accepted by provider  Results for orders placed or performed during the hospital encounter of 01/22/24  Blood Culture ID Panel (Reflexed) (Collected: 01/23/2024  3:46 AM)  Result Value Ref Range   Enterococcus faecalis NOT DETECTED NOT DETECTED   Enterococcus Faecium NOT DETECTED NOT DETECTED   Listeria monocytogenes NOT DETECTED NOT DETECTED   Staphylococcus species NOT DETECTED NOT DETECTED   Staphylococcus aureus (BCID) NOT DETECTED NOT DETECTED   Staphylococcus epidermidis NOT DETECTED NOT DETECTED   Staphylococcus lugdunensis NOT DETECTED NOT DETECTED   Streptococcus species NOT DETECTED NOT DETECTED   Streptococcus agalactiae NOT DETECTED NOT DETECTED   Streptococcus pneumoniae NOT DETECTED NOT DETECTED   Streptococcus pyogenes NOT DETECTED NOT DETECTED   A.calcoaceticus-baumannii NOT DETECTED NOT DETECTED   Bacteroides fragilis NOT DETECTED NOT DETECTED   Enterobacterales DETECTED (A) NOT DETECTED   Enterobacter cloacae complex NOT DETECTED NOT DETECTED   Escherichia coli DETECTED (A) NOT DETECTED   Klebsiella aerogenes NOT DETECTED NOT DETECTED   Klebsiella oxytoca NOT DETECTED NOT DETECTED   Klebsiella pneumoniae NOT DETECTED NOT DETECTED   Proteus species NOT DETECTED NOT DETECTED   Salmonella species NOT DETECTED NOT DETECTED   Serratia marcescens NOT DETECTED NOT DETECTED   Haemophilus influenzae NOT DETECTED NOT DETECTED   Neisseria meningitidis NOT DETECTED NOT DETECTED   Pseudomonas aeruginosa  NOT DETECTED NOT DETECTED   Stenotrophomonas maltophilia NOT DETECTED NOT DETECTED   Candida albicans NOT DETECTED NOT DETECTED   Candida auris NOT DETECTED NOT DETECTED   Candida glabrata NOT DETECTED NOT DETECTED   Candida krusei NOT DETECTED NOT DETECTED   Candida parapsilosis NOT DETECTED NOT DETECTED   Candida tropicalis NOT DETECTED NOT DETECTED   Cryptococcus neoformans/gattii NOT DETECTED NOT DETECTED   CTX-M ESBL DETECTED (A) NOT DETECTED   Carbapenem resistance IMP NOT DETECTED NOT DETECTED   Carbapenem resistance KPC NOT DETECTED NOT DETECTED   Carbapenem resistance NDM NOT DETECTED NOT DETECTED   Carbapenem resist OXA 48 LIKE NOT DETECTED NOT DETECTED   Carbapenem resistance VIM NOT DETECTED NOT DETECTED   Prentice Poisson, PharmD Clinical Pharmacist **Pharmacist phone directory can now be found on [email or web address] ([email or web address]).  Listed under [facility].

## 2024-01-24 ENCOUNTER — Inpatient Hospital Stay (HOSPITAL_COMMUNITY)

## 2024-01-24 ENCOUNTER — Other Ambulatory Visit (HOSPITAL_COMMUNITY): Payer: Self-pay

## 2024-01-24 ENCOUNTER — Other Ambulatory Visit: Payer: Self-pay

## 2024-01-24 ENCOUNTER — Telehealth (HOSPITAL_COMMUNITY): Payer: Self-pay | Admitting: Pharmacy Technician

## 2024-01-24 DIAGNOSIS — Z7189 Other specified counseling: Secondary | ICD-10-CM

## 2024-01-24 DIAGNOSIS — L8990 Pressure ulcer of unspecified site, unspecified stage: Secondary | ICD-10-CM | POA: Insufficient documentation

## 2024-01-24 DIAGNOSIS — J9601 Acute respiratory failure with hypoxia: Secondary | ICD-10-CM

## 2024-01-24 DIAGNOSIS — I509 Heart failure, unspecified: Secondary | ICD-10-CM

## 2024-01-24 DIAGNOSIS — I5021 Acute systolic (congestive) heart failure: Secondary | ICD-10-CM | POA: Diagnosis not present

## 2024-01-24 DIAGNOSIS — Z515 Encounter for palliative care: Secondary | ICD-10-CM | POA: Diagnosis not present

## 2024-01-24 DIAGNOSIS — R7881 Bacteremia: Secondary | ICD-10-CM

## 2024-01-24 DIAGNOSIS — R0602 Shortness of breath: Secondary | ICD-10-CM

## 2024-01-24 DIAGNOSIS — J189 Pneumonia, unspecified organism: Secondary | ICD-10-CM | POA: Diagnosis not present

## 2024-01-24 LAB — CBC
HCT: 30.9 % — ABNORMAL LOW (ref 39.0–52.0)
Hemoglobin: 9.9 g/dL — ABNORMAL LOW (ref 13.0–17.0)
MCH: 28.4 pg (ref 26.0–34.0)
MCHC: 32 g/dL (ref 30.0–36.0)
MCV: 88.8 fL (ref 80.0–100.0)
Platelets: 163 K/uL (ref 150–400)
RBC: 3.48 MIL/uL — ABNORMAL LOW (ref 4.22–5.81)
RDW: 16.7 % — ABNORMAL HIGH (ref 11.5–15.5)
WBC: 7.9 K/uL (ref 4.0–10.5)
nRBC: 0 % (ref 0.0–0.2)

## 2024-01-24 LAB — LEGIONELLA PNEUMOPHILA SEROGP 1 UR AG: L. pneumophila Serogp 1 Ur Ag: NEGATIVE

## 2024-01-24 LAB — COMPREHENSIVE METABOLIC PANEL WITH GFR
ALT: 23 U/L (ref 0–44)
AST: 20 U/L (ref 15–41)
Albumin: 2.7 g/dL — ABNORMAL LOW (ref 3.5–5.0)
Alkaline Phosphatase: 56 U/L (ref 38–126)
Anion gap: 11 (ref 5–15)
BUN: 14 mg/dL (ref 8–23)
CO2: 29 mmol/L (ref 22–32)
Calcium: 8.3 mg/dL — ABNORMAL LOW (ref 8.9–10.3)
Chloride: 105 mmol/L (ref 98–111)
Creatinine, Ser: 0.87 mg/dL (ref 0.61–1.24)
GFR, Estimated: >60 mL/min (ref >60)
Glucose, Bld: 117 mg/dL — ABNORMAL HIGH (ref 70–99)
Potassium: 3 mmol/L — ABNORMAL LOW (ref 3.5–5.1)
Sodium: 145 mmol/L (ref 135–145)
Total Bilirubin: 0.9 mg/dL (ref 0.0–1.2)
Total Protein: 5.7 g/dL — ABNORMAL LOW (ref 6.5–8.1)

## 2024-01-24 LAB — MAGNESIUM: Magnesium: 1.9 mg/dL (ref 1.7–2.4)

## 2024-01-24 LAB — ECHOCARDIOGRAM COMPLETE
Calc EF: 50.6 %
Height: 73 in
S' Lateral: 3.9 cm
Single Plane A2C EF: 48.3 %
Single Plane A4C EF: 49.7 %
Weight: 3082.91 [oz_av]

## 2024-01-24 LAB — MRSA NEXT GEN BY PCR, NASAL: MRSA by PCR Next Gen: NOT DETECTED

## 2024-01-24 MED ORDER — POTASSIUM CHLORIDE CRYS ER 20 MEQ PO TBCR
40.0000 meq | EXTENDED_RELEASE_TABLET | ORAL | Status: AC
  Administered 2024-01-24 (×2): 40 meq via ORAL
  Filled 2024-01-24 (×2): qty 2

## 2024-01-24 MED ORDER — CELECOXIB 200 MG PO CAPS
200.0000 mg | ORAL_CAPSULE | Freq: Every day | ORAL | Status: DC
  Filled 2024-01-24 (×2): qty 1

## 2024-01-24 MED ORDER — SODIUM CHLORIDE 0.9% FLUSH
10.0000 mL | Freq: Two times a day (BID) | INTRAVENOUS | Status: DC
  Administered 2024-01-24 – 2024-02-01 (×10): 10 mL

## 2024-01-24 MED ORDER — GLUCERNA SHAKE PO LIQD
237.0000 mL | Freq: Three times a day (TID) | ORAL | Status: DC
  Administered 2024-01-25 – 2024-02-01 (×12): 237 mL via ORAL
  Filled 2024-01-24 (×25): qty 237

## 2024-01-24 MED ORDER — FUROSEMIDE 40 MG PO TABS: 40.0000 mg | ORAL_TABLET | Freq: Every day | ORAL | Status: DC

## 2024-01-24 MED ORDER — SACUBITRIL-VALSARTAN 24-26 MG PO TABS
1.0000 | ORAL_TABLET | Freq: Two times a day (BID) | ORAL | Status: DC
  Administered 2024-01-24 (×2): 1 via ORAL
  Filled 2024-01-24 (×5): qty 1

## 2024-01-24 MED ORDER — LOPERAMIDE HCL 2 MG PO CAPS
2.0000 mg | ORAL_CAPSULE | ORAL | Status: DC | PRN
  Administered 2024-01-24 – 2024-01-28 (×3): 2 mg via ORAL
  Filled 2024-01-24 (×23): qty 1

## 2024-01-24 MED ORDER — SODIUM CHLORIDE 0.9 % IV SOLN
1.0000 g | INTRAVENOUS | Status: DC
  Administered 2024-01-24 – 2024-01-29 (×5): 1 g via INTRAVENOUS
  Filled 2024-01-24 (×9): qty 1000

## 2024-01-24 MED ORDER — FUROSEMIDE 40 MG PO TABS
40.0000 mg | ORAL_TABLET | Freq: Every day | ORAL | Status: DC
  Administered 2024-01-25: 40 mg via ORAL
  Filled 2024-01-24 (×3): qty 1

## 2024-01-24 MED ORDER — MEDIHONEY WOUND/BURN DRESSING EX PSTE
1.0000 | PASTE | Freq: Every day | CUTANEOUS | Status: DC
  Administered 2024-01-24 – 2024-01-25 (×2): 1 via TOPICAL
  Filled 2024-01-24 (×2): qty 44

## 2024-01-24 MED ORDER — SODIUM CHLORIDE 0.9% FLUSH
10.0000 mL | INTRAVENOUS | Status: DC | PRN
  Administered 2024-01-24: 10 mL

## 2024-01-24 MED ORDER — PERFLUTREN LIPID MICROSPHERE
1.0000 mL | INTRAVENOUS | Status: AC | PRN
  Administered 2024-01-24: 3 mL via INTRAVENOUS

## 2024-01-24 MED ORDER — SPIRONOLACTONE 12.5 MG HALF TABLET
12.5000 mg | ORAL_TABLET | Freq: Every day | ORAL | Status: DC
  Administered 2024-01-24: 12.5 mg via ORAL
  Filled 2024-01-24 (×4): qty 1

## 2024-01-24 MED ORDER — ONDANSETRON HCL 4 MG PO TABS
4.0000 mg | ORAL_TABLET | Freq: Four times a day (QID) | ORAL | Status: DC | PRN
  Administered 2024-01-24 – 2024-01-26 (×2): 4 mg via ORAL
  Filled 2024-01-24 (×7): qty 1

## 2024-01-24 MED ORDER — SENNOSIDES-DOCUSATE SODIUM 8.6-50 MG PO TABS
1.0000 | ORAL_TABLET | Freq: Every evening | ORAL | Status: DC | PRN
  Filled 2024-01-24: qty 1

## 2024-01-24 NOTE — Telephone Encounter (Signed)
 Patient Product/process development scientist completed.    The patient is insured through [employer]. Patient has Medicare and is not eligible for a copay card, but may be able to apply for patient assistance or Medicare RX Payment Plan (Patient Must reach out to their plan, if eligible for payment plan), if available.    Ran test claim for Entresto 24-26 mg and the current 30 day co-pay is $0.00.   This test claim was processed through [facility]- copay amounts may vary at other pharmacies due to pharmacy/plan contracts, or as the patient moves through the different stages of their insurance plan.     Morgan Arab, CPHT Pharmacy Technician III Certified Patient Advocate [facility] Patient Advocate Team Direct Number: [phone]  Fax: [phone]

## 2024-01-24 NOTE — Progress Notes (Signed)
 [facility] to [facility]. Communicated with patient via video tablet. AAOx4. No reports of pain. SOB on exertion noted. Room air. Plan of care reviewed with patient. Patient and wife was told the the virtual nurse is available 24/7. [facility] phone number given to patient. Tablet usage explained. Medication reconciliation and skin check completed with Lauraine, Paramedic. Patient/spouse agreeable to plan of care.

## 2024-01-24 NOTE — Progress Notes (Signed)

## 2024-01-24 NOTE — Progress Notes (Signed)
 PT admitted to [facility] program. Safety assessment performed. [name] home has a fairly steep driveway, however pt does not usually walk out there and will not be while on program. [name] home appears clear and free of clutter. All walkways are clear. Pt spends majority of time in recliner chair in family room. Pt has close access, approximately  12 feet from chair to bathroom. I also provided 2 urinals for pt and a bedside commode. Pt states he does not want to use bedside commode at this time and same was placed in an adjacent room in case he changes his mind. Pt is on contact precautions at this time and a PPE box was set up right outside of his front door and all [facility] staff made aware of same. Pt usually ambulates with a cane and minimal assistance. [name] bedroom is upstairs and has an adjacent bathroom with a walk in shower and shower chair. Pt became dizzy multiple times during visit while standing and was encouraged to sleep in recliner with padding and pillows for tonight to avoid any falls trying to get upstairs. [name] lives with pt and encouraged the same. Pt discouraged from taking regular showers/ baths at this time due to his bathroom being upstairs and feeling dizzy. Pt encouraged to take baths with washcloth and wife's assistance and also given large bath wipes to use as well if he would like.   Pt has 3 wounds on sacral area/ buttocks. Same assessed with virtual RN and photos uploaded to media tab. I applied Gerhardt's cream and redressed using a sacral Mepilex. Pt also encouraged to try to rotate slightly onto his side while in chair, switching sides every 2 hours at least. Pressure redistribution cushion also placed in [name] recliner.   Meds sequestered into bag and placed on dining room table. Med rec performed with virtual RN.  I informed the night virtual RN as well, the pt usually takes Celebrex  at bedtime and same was not ordered. Pt also states the cardiologist told him not to take Lasix   anymore but same was ordered for tomorrow. Pt educated on benefits of nutrtion while healing wounds and would like either caramel or vanilla flavored ensure drinks as well, as long as they are the zero or low sugar version.   Pt encouraged to call virtual hub with any questions. Reinforced how to make virtual calls on the tablet and how to call using a phone as well.  Pt usually gets up around 9 or 9:30 am and would appreciate a visit around 10:00am if possible.

## 2024-01-24 NOTE — Evaluation (Signed)
 Occupational Therapy Evaluation Patient Details Name: Chad Moreno MRN: [id number] DOB: September 20, 1940 Today's Date: 01/24/2024   History of Present Illness   Pt is a 83 y.o. M who presents 01/22/2024 for evaluation of acute onset shortness of breath/respiratory distress. Imaging was concerning for potential patchy groundglass and airspace opacities worrisome for infection versus inflammation, also evidence of fluid overload.  He was placed on antibiotics, diuretics, and admitted to the hospital. Significant PMH: metastatic lung cancer currently on immunotherapy, chronic thrombocytopenia, HTN, HLD, recent bleed in July 2025 for small bowel obstruction, GI bleed from cecal colitis/diverticulosis, ESBL UTI and AKI.     Clinical Impressions Pt admitted for above, PTA pt reports his spouse assists with LBD but overall mod I for the rest of ADLs and ambulates no AD in home. Pt currently ambulating with CGA no AD, c/o dizziness with fast pace so instructed pt to rest and wait when initially changing positions & slow pace. Pt needing min A to setup A for ADLs, him and his spouse were educated on CHF precaution and given handout of zones to promote carryover. OT to continue follow pt while in acute setting to progress as able. Patient would benefit from post acute Home OT services to help maximize functional independence in natural environment      If plan is discharge home, recommend the following:   A little help with bathing/dressing/bathroom;Assistance with cooking/housework     Functional Status Assessment   Patient has had a recent decline in their functional status and demonstrates the ability to make significant improvements in function in a reasonable and predictable amount of time.       Precautions/Restrictions   Precautions Precautions: Fall;Other (comment) Precaution/Restrictions Comments: dizziness with postural changes Restrictions Weight Bearing Restrictions Per Provider  Order: No     Mobility Bed Mobility Overal bed mobility: Needs Assistance Bed Mobility: Supine to Sit, Sit to Supine     Supine to sit: Min assist Sit to supine: Contact guard assist   General bed mobility comments: Dizziness with postural changes    Transfers Overall transfer level: Needs assistance Equipment used: None Transfers: Sit to/from Stand Sit to Stand: Contact guard assist           General transfer comment: CGA for STS from toilet, dizziness upon standing. Instructed pt on slow pace to allow time for BP adjustment      Balance Overall balance assessment: Needs assistance Sitting-balance support: Feet supported Sitting balance-Leahy Scale: Good     Standing balance support: No upper extremity supported, During functional activity Standing balance-Leahy Scale: Fair                             ADL either performed or assessed with clinical judgement   ADL Overall ADL's : Needs assistance/impaired Eating/Feeding: Independent;Sitting   Grooming: Standing;Contact guard assist   Upper Body Bathing: Sitting;Set up   Lower Body Bathing: Sitting/lateral leans;Set up   Upper Body Dressing : Sitting;Set up   Lower Body Dressing: Sit to/from stand;Minimal assistance Lower Body Dressing Details (indicate cue type and reason): don pants Toilet Transfer: Contact guard assist;Ambulation   Toileting- Clothing Manipulation and Hygiene: Sitting/lateral lean;Set up       Functional mobility during ADLs: Contact guard assist General ADL Comments: Educated pt on CHF signs/symptoms and diet restrictions.     Vision Baseline Vision/History: 1 Wears glasses Vision Assessment?: No apparent visual deficits     Perception  Praxis         Pertinent Vitals/Pain Pain Assessment Pain Assessment: No/denies pain     Extremity/Trunk Assessment Upper Extremity Assessment Upper Extremity Assessment: Overall WFL for tasks assessed   Lower  Extremity Assessment Lower Extremity Assessment: Overall WFL for tasks assessed   Cervical / Trunk Assessment Cervical / Trunk Assessment: Normal   Communication Communication Communication: Impaired Factors Affecting Communication: Hearing impaired   Cognition   Behavior During Therapy: WFL for tasks assessed/performed               OT - Cognition Comments: no cognitive concerns from family                 Following commands: Intact       Cueing  General Comments   Cueing Techniques: Verbal cues  Pt and spouse with good understanding of education.   Exercises     Shoulder Instructions      Home Living Family/patient expects to be discharged to:: Private residence Living Arrangements: Spouse/significant other Available Help at Discharge: Family Type of Home: House Home Access: Stairs to enter Secretary/administrator of Steps: 5 Entrance Stairs-Rails: Right Home Layout: Two level Alternate Level Stairs-Number of Steps: flight   Bathroom Shower/Tub: Chief Strategy Officer: Standard     Home Equipment: Agricultural consultant (2 wheels);Shower seat;Hand held shower head;Adaptive equipment Adaptive Equipment: Reacher;Long-handled shoe horn Additional Comments: denies falls  Lives With: Spouse    Prior Functioning/Environment Prior Level of Function : Independent/Modified Independent;Driving             Mobility Comments: no AD ADLs Comments: ind    OT Problem List: Decreased strength;Decreased safety awareness;Impaired balance (sitting and/or standing)   OT Treatment/Interventions:        OT Goals(Current goals can be found in the care plan section)   Acute Rehab OT Goals Patient Stated Goal: Go home OT Goal Formulation: With patient/family Time For Goal Achievement: 02/07/24 Potential to Achieve Goals: Good ADL Goals Pt Will Perform Grooming: with modified independence;standing Pt Will Perform Lower Body Bathing: with modified  independence;sit to/from stand Pt Will Perform Lower Body Dressing: with modified independence;sit to/from stand Pt Will Transfer to Toilet: with modified independence;ambulating   OT Frequency:  Min 1X/week    Co-evaluation              AM-PAC OT 6 Clicks Daily Activity     Outcome Measure Help from another person eating meals?: None Help from another person taking care of personal grooming?: A Little Help from another person toileting, which includes using toliet, bedpan, or urinal?: A Little Help from another person bathing (including washing, rinsing, drying)?: A Little Help from another person to put on and taking off regular upper body clothing?: A Little Help from another person to put on and taking off regular lower body clothing?: A Little 6 Click Score: 19   End of Session Nurse Communication: Mobility status  Activity Tolerance: Patient tolerated treatment well Patient left: in bed;with call bell/phone within reach;with family/visitor present  OT Visit Diagnosis: Unsteadiness on feet (R26.81);Muscle weakness (generalized) (M62.81);Dizziness and giddiness (R42)                Time: 8386-8359 OT Time Calculation (min): 27 min Charges:  OT General Charges $OT Visit: 1 Visit OT Evaluation $OT Eval Low Complexity: 1 Low OT Treatments $Self Care/Home Management : 8-22 mins  01/24/2024  AB, OTR/L  Acute Rehabilitation Services  Office: [phone]   Curtistine BIRCH  Kla Bily 01/24/2024, 4:56 PM

## 2024-01-24 NOTE — Progress Notes (Addendum)
 2- RN introduction, partial assessment, and self-administration medication plan, and [facility] contact information reviewed. Patient preferred and agreed to a video call for medication closer to 2200. Patient identifiers completed, patient alert and oriented, denied pain, respiratory, and cardiac concerns at time of call. Current health data is being captured. Patient and spouse encouraged to call RN if needed before upcoming video call; patient will continue to be monitored. Spouse confirmed best contact is home number. Patient's cell currently going to voicemail only.     2158- Patient /caregiver contact via Video call. Patient identifiers review  patient sitting up in chair with spouse at side.  Self-administration of medications completed with assistance of spouse as needed.  Patient A&O x4. Patient denies the need for additional support at this time.  Patient reported mild pain in right side, request Tylenol  to resolve. Confirmed dizziness has resolved. Education on safety, changing positions, and Entresto completed. Patient and spouse informed of provider updates related to Lasix  dose, Celebrex , and Glucerna Shakes for 8/2.  Overnight monitoring plan and [facility] contact information, when to call RN vs 911 for emergency needs and s/sx of change in condition, plan for AM medications reviewed. Per Patient and Spouse, no other questions or concerns at the time of video call. Patient will continue to be monitored.

## 2024-01-24 NOTE — Consult Note (Signed)
 [facility]    Date of Admission:  01/22/2024     Reason for Consult: severe sepsis    Referring Provider: Trixie Hilda     Lines:  Peripheral iv's  Abx: 8/1-c erta  7/31-8/1 meropenem  7/31-8/1 doxy        Assessment: 83 y.o. male  with past medical history of metastatic small cell lung cancer status post SBRT and currently on immunotherapy, chronic thrombocytopenia, hypertension, hyperlipidemia, gout, GERD, recent hospital admission 7/7-7/16 for SBO, GI bleed from cecal colitis/diverticulosis, ESBL UTI, and AKI admitted on 01/22/2024 with acute onset of shortness of breath/respiratory distress, found to have what appears to be volume overload and esbl ecoli bacteremia   Patient rapidly improved on meropenem  Getting diuresed  No fever/leukocytosis here   Suspect gi/gu translocation esbl ecoli. Resp distress rapidly resolved suspect with diuresis and lower suspicion for pna.   Plan 7 day abx tx for bacteremia  Patient ok from my standpoint to discharge with hospital at home program  Plan: Change abx to ertapenem for ease of discharge/hospital at home Can stop doxycycline  Bcx here can be f/u'ed and change to oral abx as needed if suspceptibility found Will need a midline -- ordered; can be removed if oral abx option found Maintain contact isolation precaution No need for id clinic f/u  Discussed with primary team     ------------------------------------------------ Principal Problem:   Acute CHF (congestive heart failure) (HCC) Active Problems:   Acute hypoxemic respiratory failure (HCC)   CAP (community acquired pneumonia)   SIRS (systemic inflammatory response syndrome) (HCC)   Elevated troponin   CHF exacerbation (HCC)   Pressure injury of skin    HPI: Chad Moreno is a 83 y.o. male   with past medical history of metastatic small cell lung cancer status post SBRT and currently on immunotherapy, chronic  thrombocytopenia, hypertension, hyperlipidemia, gout, GERD, recent hospital admission 7/7-7/16 for SBO, GI bleed from cecal colitis/diverticulosis, ESBL UTI, and AKI admitted on 01/22/2024 with acute onset of shortness of breath/respiratory distress, found to have what appears to be volume overload and esbl ecoli bacteremia   Hx discussed with patient and his wife and chart reviewed  Patient acutely felt sob at home of less than 24 hrs  Need bipap but quickly weaned down to 3 liters o2 and off today  Afebrile No leukocytosis  Bcx returned with esbl ecoli; pending susceptibility; previous esbl ecoli in urine  Better on meropenem  Getting diuresed as well Bnp 2000s  No current sx at this time in terms of uti, joint/back pain, n/v/d/abd pain, cough, chest pain  Chest cta as below   Family History  Problem Relation Age of Onset   Prostate cancer Father     Social History   Tobacco Use   Smoking status: Former    Current packs/day: 0.00    Average packs/day: 0.5 packs/day for 10.0 years (5.0 ttl pk-yrs)    Types: Cigarettes    Start date: 42    Quit date: 32    Years since quitting: 44.6   Smokeless tobacco: Never  Vaping Use   Vaping status: Never Used  Substance Use Topics   Alcohol  use: Yes    Comment: rare   Drug use: Never    Allergies  Allergen Reactions   Sulfa Antibiotics Anaphylaxis    Review of Systems: ROS All Other ROS was negative, except mentioned above   Past Medical History:  Diagnosis Date  Age-related cataract of left eye    Amblyopia, right eye    Arthritis    Cancer (HCC)    Dysuria    ED (erectile dysfunction)    Elevated PSA    Epidermal cyst    Family history of prostate cancer    Gout    Hip pain    Histoplasmosis    Hyperlipidemia    Hypermetropia, left eye    Hypertension    Impaired glucose tolerance    Joint pain    Left shoulder pain    Lower back pain    Melanocytic nevi, unspecified    Nummular dermatitis     Ocular hypertension    Palmar fascial fibromatosis    Prostate cancer screening    Regular astigmatism, right eye    Senile nuclear sclerosis        Scheduled Meds:  enoxaparin  (LOVENOX ) injection  40 mg Subcutaneous Q24H   furosemide   40 mg Intravenous BID   Gerhardt's butt cream   Topical BID   leptospermum manuka honey  1 Application Topical Daily   sodium chloride  flush  10-40 mL Intracatheter Q12H   Continuous Infusions:  ertapenem 1 g (01/24/24 1311)   PRN Meds:.acetaminophen  **OR** acetaminophen , ipratropium-albuterol , sodium chloride  flush   OBJECTIVE: Blood pressure 131/78, pulse 87, temperature 97.8 F (36.6 C), temperature source Oral, resp. rate 19, height 6' 1 (1.854 m), weight 87.4 kg, SpO2 97%.  Physical Exam  General/constitutional: no distress, pleasant, conversant HEENT: Normocephalic, PER, Conj Clear, EOMI, Oropharynx clear Neck supple CV: rrr no mrg Lungs: clear to auscultation, normal respiratory effort Abd: Soft, Nontender Ext: no edema Skin: No Rash Neuro: nonfocal MSK: no peripheral joint swelling/tenderness/warmth; back spines nontender    Lab Results Lab Results  Component Value Date   WBC 7.9 01/24/2024   HGB 9.9 (L) 01/24/2024   HCT 30.9 (L) 01/24/2024   MCV 88.8 01/24/2024   PLT 163 01/24/2024    Lab Results  Component Value Date   CREATININE 0.87 01/24/2024   BUN 14 01/24/2024   NA 145 01/24/2024   K 3.0 (L) 01/24/2024   CL 105 01/24/2024   CO2 29 01/24/2024    Lab Results  Component Value Date   ALT 23 01/24/2024   AST 20 01/24/2024   ALKPHOS 56 01/24/2024   BILITOT 0.9 01/24/2024      Microbiology: Recent Results (from the past 240 hours)  Resp panel by RT-PCR (RSV, Flu A&B, Covid) Anterior Nasal Swab     Status: None   Collection Time: 01/22/24  9:26 PM   Specimen: Anterior Nasal Swab  Result Value Ref Range Status   SARS Coronavirus 2 by RT PCR NEGATIVE NEGATIVE Final   Influenza A by PCR NEGATIVE NEGATIVE  Final   Influenza B by PCR NEGATIVE NEGATIVE Final    Comment: (NOTE) The Xpert Xpress SARS-CoV-2/FLU/RSV plus assay is intended as an aid in the diagnosis of influenza from Nasopharyngeal swab specimens and should not be used as a sole basis for treatment. Nasal washings and aspirates are unacceptable for Xpert Xpress SARS-CoV-2/FLU/RSV testing.  Fact Sheet for Patients: BloggerCourse.com  Fact Sheet for Healthcare Providers: SeriousBroker.it  This test is not yet approved or cleared by the [place]  FDA and has been authorized for detection and/or diagnosis of SARS-CoV-2 by FDA under an Emergency Use Authorization (EUA). This EUA will remain in effect (meaning this test can be used) for the duration of the COVID-19 declaration under Section 564(b)(1) of the Act, 21 U.S.C.  section 360bbb-3(b)(1), unless the authorization is terminated or revoked.     Resp Syncytial Virus by PCR NEGATIVE NEGATIVE Final    Comment: (NOTE) Fact Sheet for Patients: BloggerCourse.com  Fact Sheet for Healthcare Providers: SeriousBroker.it  This test is not yet approved or cleared by the [place]  FDA and has been authorized for detection and/or diagnosis of SARS-CoV-2 by FDA under an Emergency Use Authorization (EUA). This EUA will remain in effect (meaning this test can be used) for the duration of the COVID-19 declaration under Section 564(b)(1) of the Act, 21 U.S.C. section 360bbb-3(b)(1), unless the authorization is terminated or revoked.  Performed at [facility], [address], [place], [place] [address]   Culture, blood (Routine X 2) w Reflex to ID Panel     Status: Abnormal (Preliminary result)   Collection Time: 01/23/24  3:46 AM   Specimen: BLOOD RIGHT FOREARM  Result Value Ref Range Status   Specimen Description BLOOD RIGHT FOREARM  Final   Special Requests   Final     BOTTLES DRAWN AEROBIC AND ANAEROBIC Blood Culture adequate volume   Culture  Setup Time   Final    GRAM NEGATIVE RODS AEROBIC BOTTLE ONLY CRITICAL RESULT CALLED TO, READ BACK BY AND VERIFIED WITH: PHARMD AMDREW GRETEL [phone] AT 1748 BY EC    Culture (A)  Final    ESCHERICHIA COLI SUSCEPTIBILITIES TO FOLLOW Performed at [facility], [address], [place], [place] [address]    Report Status PENDING  Incomplete  Culture, blood (Routine X 2) w Reflex to ID Panel     Status: None (Preliminary result)   Collection Time: 01/23/24  3:46 AM   Specimen: BLOOD LEFT HAND  Result Value Ref Range Status   Specimen Description BLOOD LEFT HAND  Final   Special Requests   Final    BOTTLES DRAWN AEROBIC AND ANAEROBIC Blood Culture adequate volume   Culture   Final    NO GROWTH 1 DAY Performed at [facility], [address], [place], [place] [address]    Report Status PENDING  Incomplete  Blood Culture ID Panel (Reflexed)     Status: Abnormal   Collection Time: 01/23/24  3:46 AM  Result Value Ref Range Status   Enterococcus faecalis NOT DETECTED NOT DETECTED Final   Enterococcus Faecium NOT DETECTED NOT DETECTED Final   Listeria monocytogenes NOT DETECTED NOT DETECTED Final   Staphylococcus species NOT DETECTED NOT DETECTED Final   Staphylococcus aureus (BCID) NOT DETECTED NOT DETECTED Final   Staphylococcus epidermidis NOT DETECTED NOT DETECTED Final   Staphylococcus lugdunensis NOT DETECTED NOT DETECTED Final   Streptococcus species NOT DETECTED NOT DETECTED Final   Streptococcus agalactiae NOT DETECTED NOT DETECTED Final   Streptococcus pneumoniae NOT DETECTED NOT DETECTED Final   Streptococcus pyogenes NOT DETECTED NOT DETECTED Final   A.calcoaceticus-baumannii NOT DETECTED NOT DETECTED Final   Bacteroides fragilis NOT DETECTED NOT DETECTED Final   Enterobacterales DETECTED (A) NOT DETECTED Final    Comment: Enterobacterales represent a large order of gram negative  bacteria, not a single organism. CRITICAL RESULT CALLED TO, READ BACK BY AND VERIFIED WITH: PHARMD ANDREW MEYER [phone] AT 1748 BY EC    Enterobacter cloacae complex NOT DETECTED NOT DETECTED Final   Escherichia coli DETECTED (A) NOT DETECTED Final    Comment: CRITICAL RESULT CALLED TO, READ BACK BY AND VERIFIED WITH: MAYA PRENTICE GRETEL [phone] AT 1748 BY EC    Klebsiella aerogenes NOT DETECTED NOT DETECTED  Final   Klebsiella oxytoca NOT DETECTED NOT DETECTED Final   Klebsiella pneumoniae NOT DETECTED NOT DETECTED Final   Proteus species NOT DETECTED NOT DETECTED Final   Salmonella species NOT DETECTED NOT DETECTED Final   Serratia marcescens NOT DETECTED NOT DETECTED Final   Haemophilus influenzae NOT DETECTED NOT DETECTED Final   Neisseria meningitidis NOT DETECTED NOT DETECTED Final   Pseudomonas aeruginosa NOT DETECTED NOT DETECTED Final   Stenotrophomonas maltophilia NOT DETECTED NOT DETECTED Final   Candida albicans NOT DETECTED NOT DETECTED Final   Candida auris NOT DETECTED NOT DETECTED Final   Candida glabrata NOT DETECTED NOT DETECTED Final   Candida krusei NOT DETECTED NOT DETECTED Final   Candida parapsilosis NOT DETECTED NOT DETECTED Final   Candida tropicalis NOT DETECTED NOT DETECTED Final   Cryptococcus neoformans/gattii NOT DETECTED NOT DETECTED Final   CTX-M ESBL DETECTED (A) NOT DETECTED Final    Comment: CRITICAL RESULT CALLED TO, READ BACK BY AND VERIFIED WITH: PHARMD PRENTICE POISSON [id number] AT 1748 BY EC (NOTE) Extended spectrum beta-lactamase detected. Recommend a carbapenem as initial therapy.      Carbapenem resistance IMP NOT DETECTED NOT DETECTED Final   Carbapenem resistance KPC NOT DETECTED NOT DETECTED Final   Carbapenem resistance NDM NOT DETECTED NOT DETECTED Final   Carbapenem resist OXA 48 LIKE NOT DETECTED NOT DETECTED Final   Carbapenem resistance VIM NOT DETECTED NOT DETECTED Final    Comment: Performed at [facility], [address], [place], [place] [address]  Respiratory (~20 pathogens) panel by PCR     Status: None   Collection Time: 01/23/24  4:15 AM   Specimen: Nasopharyngeal Swab; Respiratory  Result Value Ref Range Status   Adenovirus NOT DETECTED NOT DETECTED Final   Coronavirus 229E NOT DETECTED NOT DETECTED Final    Comment: (NOTE) The Coronavirus on the Respiratory Panel, DOES NOT test for the novel  Coronavirus (2019 nCoV)    Coronavirus HKU1 NOT DETECTED NOT DETECTED Final   Coronavirus NL63 NOT DETECTED NOT DETECTED Final   Coronavirus OC43 NOT DETECTED NOT DETECTED Final   Metapneumovirus NOT DETECTED NOT DETECTED Final   Rhinovirus / Enterovirus NOT DETECTED NOT DETECTED Final   Influenza A NOT DETECTED NOT DETECTED Final   Influenza B NOT DETECTED NOT DETECTED Final   Parainfluenza Virus 1 NOT DETECTED NOT DETECTED Final   Parainfluenza Virus 2 NOT DETECTED NOT DETECTED Final   Parainfluenza Virus 3 NOT DETECTED NOT DETECTED Final   Parainfluenza Virus 4 NOT DETECTED NOT DETECTED Final   Respiratory Syncytial Virus NOT DETECTED NOT DETECTED Final   Bordetella pertussis NOT DETECTED NOT DETECTED Final   Bordetella Parapertussis NOT DETECTED NOT DETECTED Final   Chlamydophila pneumoniae NOT DETECTED NOT DETECTED Final   Mycoplasma pneumoniae NOT DETECTED NOT DETECTED Final    Comment: Performed at [facility], [address], [place], [place] [address]  Culture, blood (Routine X 2) w Reflex to ID Panel     Status: None (Preliminary result)   Collection Time: 01/23/24  6:50 PM   Specimen: BLOOD RIGHT HAND  Result Value Ref Range Status   Specimen Description BLOOD RIGHT HAND  Final   Special Requests   Final    BOTTLES DRAWN AEROBIC ONLY Blood Culture results may not be optimal due to an inadequate volume of blood received in culture bottles   Culture   Final    NO GROWTH < 12 HOURS Performed at [facility], [address] [place], [place] [address]    Report Status PENDING   Incomplete  Culture, blood (Routine X 2) w Reflex to ID Panel     Status: None (Preliminary result)   Collection Time: 01/23/24  6:55 PM   Specimen: BLOOD LEFT HAND  Result Value Ref Range Status   Specimen Description BLOOD LEFT HAND  Final   Special Requests   Final    BOTTLES DRAWN AEROBIC ONLY Blood Culture results may not be optimal due to an inadequate volume of blood received in culture bottles   Culture   Final    NO GROWTH < 12 HOURS Performed at [facility], [address], [place], [place] [address]    Report Status PENDING  Incomplete     Serology:    Imaging: If present, new imagings (plain films, ct scans, and mri) have been personally visualized and interpreted; radiology reports have been reviewed. Decision making incorporated into the Impression / Recommendations.  7/30 ct chest angio 1. No evidence for pulmonary embolism. 2. Small bilateral pleural effusions, left greater than right, have increased from prior. 3. Patchy ground-glass and airspace opacities centrally in the right upper lobe and left upper lobe, new from prior, worrisome for infection or inflammation. 4. Central peribronchial wall thickening diffusely. 5. Stable masslike densities in the left upper lobe and right lower lobe with fiduciary marker     Constance ONEIDA Passer, MD [facility] [phone] pager    01/24/2024, 1:51 PM

## 2024-01-24 NOTE — Consult Note (Signed)
 Cardiology Consultation   Patient ID: RADIN RAPTIS MRN: [id number]; DOB: Feb 25, 1941  Admit date: 01/22/2024 Date of Consult: 01/24/2024  PCP:  [name], MD   [facility] Providers Cardiologist:  New  Patient Profile: DONIELLE RADZIEWICZ is a 83 y.o. male with a hx of metastatic SCLC currently on immunotherapy, chronic thrombocytopenia, histoplasmosis, hypertension, hyperlipidemia, GERD, gout, recently admitted for SBO/GI bleed, who is being seen 01/24/2024 for the evaluation of acute HFrEF at the request of Dr. Trixie.  History of Present Illness: Mr. Degregory has past medical history stated above.  He presented to the [facility] emergency department on 01/22/2024 complaining of shortness of breath.  He had initially required BiPAP but then was weaned down to 3 L via nasal cannula.    Relevant workup in the ED/since admission includes: CBC showed stable anemia without leukocytosis, CMP showed hypokalemia at 3, lactic acid 2.7 > 2.0, CXR showed decreasing pleural effusions, persistent retrocardiac airspace consolidation, BNP 2,201, troponin levels 127 > 124. CTPA showed no PE, evidence of small bilateral pleural effusions, aortic atherosclerosis.   Echocardiogram this admission showed: EF 30 to 35%, LV R WMA, mild LVH, mildly reduced RV function, trivial MR, normal IVC.  Could represent Takotsubo cardiomyopathy vs LAD infarct.His last echocardiogram was from January 2025 where his EF was 55% with no regional wall motion abnormalities.  He was admitted to the hospitalist service. He was placed on antibiotics, diuretics and respiratory status improved. His blood cultures were positive for ESBL E. coli, infectious disease was consulted to help with treatment of acute HFrEF.  After speaking with the patient, he agrees with the history above. He tells me that he was becoming short of breath and that prompted him to call EMS. He reports feeling much better since being admitted to the  hospital. He has had good urinary response to IV Lasix . Considering all of his co-morbidities he is doing fairly well and has a good attitude. He wants to proceed with medical management to treat his new cardiomyopathy.   Past Medical History:  Diagnosis Date   Age-related cataract of left eye    Amblyopia, right eye    Arthritis    Cancer (HCC)    Dysuria    ED (erectile dysfunction)    Elevated PSA    Epidermal cyst    Family history of prostate cancer    Gout    Hip pain    Histoplasmosis    Hyperlipidemia    Hypermetropia, left eye    Hypertension    Impaired glucose tolerance    Joint pain    Left shoulder pain    Lower back pain    Melanocytic nevi, unspecified    Nummular dermatitis    Ocular hypertension    Palmar fascial fibromatosis    Prostate cancer screening    Regular astigmatism, right eye    Senile nuclear sclerosis    Past Surgical History:  Procedure Laterality Date   BRONCHIAL BIOPSY  08/27/2022   Procedure: BRONCHIAL BIOPSIES;  Surgeon: Shelah Lamar RAMAN, MD;  Location: [facility] ENDOSCOPY;  Service: Pulmonary;;   BRONCHIAL BRUSHINGS  08/27/2022   Procedure: BRONCHIAL BRUSHINGS;  Surgeon: Shelah Lamar RAMAN, MD;  Location: [facility] ENDOSCOPY;  Service: Pulmonary;;   BRONCHIAL NEEDLE ASPIRATION BIOPSY  08/27/2022   Procedure: BRONCHIAL NEEDLE ASPIRATION BIOPSIES;  Surgeon: Shelah Lamar RAMAN, MD;  Location: [facility] ENDOSCOPY;  Service: Pulmonary;;   CONSTRICTING FINGER RING EXCISION Left    CYST REMOVAL TRUNK Left  left posterior shoulder   FIDUCIAL MARKER PLACEMENT  08/27/2022   Procedure: FIDUCIAL MARKER PLACEMENT;  Surgeon: Shelah Lamar RAMAN, MD;  Location: [facility] ENDOSCOPY;  Service: Pulmonary;;   GOLD SEED IMPLANT N/A 10/02/2022   Procedure: GOLD SEED IMPLANT;  Surgeon: Lovie Arlyss CROME, MD;  Location: [facility];  Service: Urology;  Laterality: N/A;   index finger Left    IR FACET JT INJ C/T  2ND LEVEL RIGHT W/FL/CT  01/20/2024   IR FACET JT INJ C/T  SINGLE LEVEL RIGHT  W/FL/CT  01/20/2024   IR FACET JT INJ C/T 3RD PLUS LEVEL RIGHT W/FL/CT  01/20/2024   IR THORACENTESIS ASP PLEURAL SPACE W/IMG GUIDE  06/14/2023   Prostate needle biopsy     SPACE OAR INSTILLATION N/A 10/02/2022   Procedure: SPACE OAR INSTILLATION;  Surgeon: Lovie Arlyss CROME, MD;  Location: [facility];  Service: Urology;  Laterality: N/A;    Home Medications:  Prior to Admission medications   Medication Sig Start Date End Date Taking? Authorizing Provider  B Complex Vitamins (VITAMIN-B COMPLEX) TABS Take 1 tablet by mouth daily with breakfast.   Yes [provider]  calcium carbonate (OSCAL) 1500 (600 Ca) MG TABS tablet Take 1,200 mg by mouth 2 (two) times daily with a meal.   Yes [provider]  celecoxib  (CELEBREX ) 200 MG capsule Take 1 capsule (200 mg total) by mouth 2 (two) times daily. 12/03/23  Yes Pickenpack-Cousar, Fannie SAILOR, NP  cyanocobalamin (VITAMIN B12) 1000 MCG tablet Take 1,000 mcg by mouth daily.   Yes [provider]  loperamide  (IMODIUM  A-D) 2 MG tablet Take 1 tablet (2 mg total) by mouth 4 (four) times daily as needed for diarrhea or loose stools. 01/17/24  Yes Mannie Pac T, DO  Nystatin (GERHARDT'S BUTT CREAM) CREA Apply 1 Application topically 4 (four) times daily. 01/08/24  Yes Akula, Vijaya, MD  ondansetron  (ZOFRAN -ODT) 4 MG disintegrating tablet Take 1 tablet (4 mg total) by mouth every 8 (eight) hours as needed for nausea or vomiting. 01/17/24  Yes Mannie Pac T, DO  pantoprazole  (PROTONIX ) 40 MG tablet Take 1 tablet (40 mg total) by mouth 2 (two) times daily. 01/08/24 02/07/24 Yes Akula, Vijaya, MD  POTASSIUM PO Take 1 tablet by mouth daily with breakfast.   Yes [provider]  Probiotic Product (PROBIOTIC PEARLS ADVANTAGE PO) Take 1 capsule by mouth in the morning.   Yes [provider]  protein supplement shake (PREMIER PROTEIN) LIQD Take 11 oz by mouth See admin instructions. Drink one to two cartons daily    Yes [provider]  silodosin  (RAPAFLO ) 8 MG CAPS capsule Take 8 mg by mouth daily. 07/04/23  Yes [provider]  binimetinib  (MEKTOVI ) 15 MG tablet Take 3 tablets (45 mg total) by mouth 2 (two) times daily. Patient not taking: Reported on 01/23/2024 01/10/24   [name] [name], MD  encorafenib  (BRAFTOVI ) 75 MG capsule Take 6 capsules (450 mg total) by mouth daily. Patient not taking: Reported on 01/23/2024 01/10/24   [name] [name], MD  ferrous sulfate  325 (65 FE) MG EC tablet Take 1 tablet (325 mg total) by mouth daily as needed. Patient taking differently: Take 325 mg by mouth every other day. 12/03/23   Pickenpack-Cousar, Athena N, NP   Scheduled Meds:  enoxaparin  (LOVENOX ) injection  40 mg Subcutaneous Q24H   furosemide   40 mg Oral Daily   Gerhardt's butt cream   Topical BID   leptospermum manuka honey  1 Application Topical  Daily   sacubitril-valsartan  1 tablet Oral BID   sodium chloride  flush  10-40 mL Intracatheter Q12H   spironolactone  12.5 mg Oral Daily   Continuous Infusions:  ertapenem 1 g (01/24/24 1311)   PRN Meds: acetaminophen  **OR** acetaminophen , ipratropium-albuterol , sodium chloride  flush  Allergies:    Allergies  Allergen Reactions   Sulfa Antibiotics Anaphylaxis   Social History:   Social History   Socioeconomic History   Marital status: Married    Spouse name: Barnie   Number of children: Not on file   Years of education: Not on file   Highest education level: Not on file  Occupational History   Not on file  Tobacco Use   Smoking status: Former    Current packs/day: 0.00    Average packs/day: 0.5 packs/day for 10.0 years (5.0 ttl pk-yrs)    Types: Cigarettes    Start date: 23    Quit date: 75    Years since quitting: 44.6   Smokeless tobacco: Never  Vaping Use   Vaping status: Never Used  Substance and Sexual Activity   Alcohol  use: Yes    Comment: rare   Drug use: Never   Sexual activity: Not on file  Other Topics  Concern   Not on file  Social History Narrative   Not on file   Social Drivers of Health   Financial Resource Strain: Not on file  Food Insecurity: No Food Insecurity (01/23/2024)   Hunger Vital Sign    Worried About Running Out of Food in the Last Year: Never true    Ran Out of Food in the Last Year: Never true  Transportation Needs: No Transportation Needs (01/23/2024)   PRAPARE - Administrator, Civil Service (Medical): No    Lack of Transportation (Non-Medical): No  Physical Activity: Not on file  Stress: Not on file  Social Connections: Socially Integrated (01/23/2024)   Social Connection and Isolation Panel    Frequency of Communication with Friends and Family: More than three times a week    Frequency of Social Gatherings with Friends and Family: Once a week    Attends Religious Services: More than 4 times per year    Active Member of [employer] or Organizations: Yes    Attends Engineer, structural: More than 4 times per year    Marital Status: Married  Catering manager Violence: Not At Risk (01/23/2024)   Humiliation, Afraid, Rape, and Kick questionnaire    Fear of Current or Ex-Partner: No    Emotionally Abused: No    Physically Abused: No    Sexually Abused: No    Family History:   Family History  Problem Relation Age of Onset   Prostate cancer Father     ROS:  Please see the history of present illness.  All other ROS reviewed and negative.     Physical Exam/Data: Vitals:   01/24/24 0017 01/24/24 0454 01/24/24 0455 01/24/24 0745  BP: 135/76  137/77 131/78  Pulse: 82  85 87  Resp: 19  17 19   Temp: 98.8 F (37.1 C)  98 F (36.7 C) 97.8 F (36.6 C)  TempSrc: Oral  Oral Oral  SpO2: 94%  91% 97%  Weight:  87.4 kg    Height:        Intake/Output Summary (Last 24 hours) at 01/24/2024 1401 Last data filed at 01/24/2024 0818 Gross per 24 hour  Intake 729.75 ml  Output 2925 ml  Net -2195.25 ml  01/24/2024    4:54 AM 01/23/2024   11:17 AM  01/20/2024   10:13 AM  Last 3 Weights  Weight (lbs) 192 lb 10.9 oz 194 lb 14.2 oz 200 lb  Weight (kg) 87.4 kg 88.4 kg 90.719 kg     Body mass index is 25.42 kg/m.   General:  chronically ill-appearing, in no acute distress, on room air  HEENT: normal Neck: no JVD Vascular: Distal pulses 2+ bilaterally Cardiac:  normal S1, S2; RRR; no murmur  Lungs:  decreased sounds at bases bilaterally Abd: soft, nontender, no hepatomegaly  Ext: no edema Musculoskeletal:  No deformities, BUE and BLE strength normal and equal Skin: warm and dry  Neuro:  no focal abnormalities noted Psych:  Normal affect   EKG:  The EKG was personally reviewed and demonstrates: Sinus tachycardia, HR 126, no acute ischemic changes  Relevant CV Studies:  Echocardiogram, 01/24/2024 Left ventricular ejection fraction, by estimation, is 30 to 35% . The left ventricle has moderately decreased function. The left ventricle demonstrates regional wall motion abnormalities ( see scoring diagram/ findings for description) . There is mild left ventricular hypertrophy. Left ventricular diastolic parameters are indeterminate.  Right ventricular systolic function is mildly reduced. The right ventricular size is normal. Normal RV basal function with apical hypokinesis  Moderate pleural effusion in the left lateral region.  The mitral valve was not well visualized. Trivial mitral valve regurgitation.  The aortic valve was not well visualized. Aortic valve regurgitation is not visualized. No aortic stenosis is present.  The inferior vena cava is normal in size with greater than 50% respiratory variability, suggesting right atrial pressure of 3 mmHg.  Conclusion(s) / Recommendation( s) : Normal LV basal function with apical akinesis. Could represent Takotsubo cardiomyopathy vs LAD territory infarct.  Laboratory Data: High Sensitivity Troponin:   Recent Labs  Lab 12/30/23 0402 12/30/23 0540 01/22/24 2125 01/22/24 2349  TROPONINIHS  5 6 127* 124*     Chemistry Recent Labs  Lab 01/22/24 2125 01/23/24 0346 01/24/24 0218  NA 141 141 145  K 3.7 3.1* 3.0*  CL 107 104 105  CO2 22 24 29   GLUCOSE 129* 211* 117*  BUN 15 12 14   CREATININE 0.81 0.89 0.87  CALCIUM 8.4* 8.5* 8.3*  MG  --   --  1.9  GFRNONAA >60 >60 >60  ANIONGAP 12 13 11     Recent Labs  Lab 01/22/24 2125 01/24/24 0218  PROT 6.1* 5.7*  ALBUMIN 2.9* 2.7*  AST 39 20  ALT 30 23  ALKPHOS 60 56  BILITOT 1.0 0.9   Lipids No results for input(s): CHOL, TRIG, HDL, LABVLDL, LDLCALC, CHOLHDL in the last 168 hours.  Hematology Recent Labs  Lab 01/22/24 2125 01/23/24 0346 01/24/24 0218  WBC 7.8 7.9 7.9  RBC 3.70* 3.63* 3.48*  HGB 10.5* 10.3* 9.9*  HCT 33.2* 32.3* 30.9*  MCV 89.7 89.0 88.8  MCH 28.4 28.4 28.4  MCHC 31.6 31.9 32.0  RDW 16.1* 15.9* 16.7*  PLT 165 140* 163   Thyroid  No results for input(s): TSH, FREET4 in the last 168 hours.  BNP Recent Labs  Lab 01/22/24 2125  BNP 2,201.2*    DDimer No results for input(s): DDIMER in the last 168 hours.  Radiology/Studies:  US  EKG SITE RITE Result Date: 01/24/2024 If Site Rite image not attached, placement could not be confirmed due to current cardiac rhythm.  ECHOCARDIOGRAM COMPLETE Result Date: 01/24/2024    ECHOCARDIOGRAM REPORT   Patient Name:   HOSEY BURMESTER  Date of Exam: 01/24/2024 Medical Rec #:  [id number]       Height:       73.0 in Accession #:    [id number]      Weight:       192.7 lb Date of Birth:  07-02-1940       BSA:          2.118 m Patient Age:    82 years        BP:           131/78 mmHg Patient Gender: M               HR:           74 bpm. Exam Location:  Inpatient Procedure: 2D Echo, Cardiac Doppler, Color Doppler and Intracardiac            Opacification Agent (Both Spectral and Color Flow Doppler were            utilized during procedure). Indications:    CHF  History:        Patient has prior history of Echocardiogram examinations, most                  recent 07/19/2023. Risk Factors:Hypertension.  Sonographer:    Philomena Daring Referring Phys: [id number] VASUNDHRA RATHORE IMPRESSIONS  1. Left ventricular ejection fraction, by estimation, is 30 to 35%. The left ventricle has moderately decreased function. The left ventricle demonstrates regional wall motion abnormalities (see scoring diagram/findings for description). There is mild left ventricular hypertrophy. Left ventricular diastolic parameters are indeterminate.  2. Right ventricular systolic function is mildly reduced. The right ventricular size is normal. Normal RV basal function with apical hypokinesis  3. Moderate pleural effusion in the left lateral region.  4. The mitral valve was not well visualized. Trivial mitral valve regurgitation.  5. The aortic valve was not well visualized. Aortic valve regurgitation is not visualized. No aortic stenosis is present.  6. The inferior vena cava is normal in size with greater than 50% respiratory variability, suggesting right atrial pressure of 3 mmHg. Conclusion(s)/Recommendation(s): Normal LV basal function with apical akinesis. Could represent Takotsubo cardiomyopathy vs LAD territory infarct. FINDINGS  Left Ventricle: Left ventricular ejection fraction, by estimation, is 30 to 35%. The left ventricle has moderately decreased function. The left ventricle demonstrates regional wall motion abnormalities. Definity contrast agent was given IV to delineate the left ventricular endocardial borders. The left ventricular internal cavity size was normal in size. There is mild left ventricular hypertrophy. Left ventricular diastolic parameters are indeterminate.  LV Wall Scoring: The entire apex is akinetic. The mid anteroseptal segment, mid inferolateral segment, mid anterolateral segment, mid inferoseptal segment, mid anterior segment, and mid inferior segment are hypokinetic. The basal anteroseptal segment, basal inferolateral segment, basal anterolateral segment, basal  anterior segment, basal inferior segment, and basal inferoseptal segment are normal. Right Ventricle: The right ventricular size is normal. No increase in right ventricular wall thickness. Right ventricular systolic function is mildly reduced. Left Atrium: Left atrial size was normal in size. Right Atrium: Right atrial size was normal in size. Pericardium: There is no evidence of pericardial effusion. Mitral Valve: The mitral valve was not well visualized. Trivial mitral valve regurgitation. Tricuspid Valve: The tricuspid valve is normal in structure. Tricuspid valve regurgitation is trivial. Aortic Valve: The aortic valve was not well visualized. Aortic valve regurgitation is not visualized. No aortic stenosis is present. Pulmonic Valve: The pulmonic valve was not well visualized. Pulmonic valve regurgitation  is not visualized. Aorta: The aortic root is normal in size and structure. Venous: The inferior vena cava is normal in size with greater than 50% respiratory variability, suggesting right atrial pressure of 3 mmHg. IAS/Shunts: The interatrial septum was not well visualized. Additional Comments: There is a moderate pleural effusion in the left lateral region.  LEFT VENTRICLE PLAX 2D LVIDd:         4.90 cm LVIDs:         3.90 cm LV PW:         1.00 cm LV IVS:        1.10 cm LVOT diam:     2.00 cm LV SV:         45 LV SV Index:   21 LVOT Area:     3.14 cm  LV Volumes (MOD) LV vol d, MOD A2C: 84.1 ml LV vol d, MOD A4C: 147.0 ml LV vol s, MOD A2C: 43.5 ml LV vol s, MOD A4C: 73.9 ml LV SV MOD A2C:     40.6 ml LV SV MOD A4C:     147.0 ml LV SV MOD BP:      59.8 ml RIGHT VENTRICLE             IVC RV S prime:     10.90 cm/s  IVC diam: 1.30 cm TAPSE (M-mode): 2.1 cm LEFT ATRIUM           Index        RIGHT ATRIUM           Index LA diam:      3.20 cm 1.51 cm/m   RA Area:     13.50 cm LA Vol (A4C): 35.5 ml 16.76 ml/m  RA Volume:   28.00 ml  13.22 ml/m  AORTIC VALVE LVOT Vmax:   85.40 cm/s LVOT Vmean:  54.000 cm/s  LVOT VTI:    0.144 m  AORTA Ao Root diam: 3.10 cm  SHUNTS Systemic VTI:  0.14 m Systemic Diam: 2.00 cm Lonni Nanas MD Electronically signed by Lonni Nanas MD Signature Date/Time: 01/24/2024/12:28:37 PM    Final    DG Chest 2 View Result Date: 01/24/2024 CLINICAL DATA:  [id number] CAP (community acquired pneumonia) [id number] EXAM: CHEST - 2 VIEW COMPARISON:  January 22, 2024 FINDINGS: Similar appearance of the left mid lung nodular opacity and masslike consolidation of the right perihilar region with fiducial markers present. Significantly improved aeration of the lungs. Small layering bilateral pleural effusions with persistent left basilar consolidation. No pneumothorax. Mild cardiomegaly. Tortuous aorta with aortic atherosclerosis. No acute fracture or destructive lesions. Multilevel thoracic osteophytosis. IMPRESSION: Significantly improved aeration of the lungs with decreasing pleural effusions, small on the left and trace on the right. Persistent retrocardiac airspace consolidation again noted. Electronically Signed   By: Rogelia Myers M.D.   On: 01/24/2024 12:06   CT Angio Chest PE W and/or Wo Contrast Result Date: 01/22/2024 CLINICAL DATA:  High probability for PE. History of non-small cell lung cancer. EXAM: CT ANGIOGRAPHY CHEST WITH CONTRAST TECHNIQUE: Multidetector CT imaging of the chest was performed using the standard protocol during bolus administration of intravenous contrast. Multiplanar CT image reconstructions and MIPs were obtained to evaluate the vascular anatomy. RADIATION DOSE REDUCTION: This exam was performed according to the departmental dose-optimization program which includes automated exposure control, adjustment of the mA and/or kV according to patient size and/or use of iterative reconstruction technique. CONTRAST:  75mL OMNIPAQUE  IOHEXOL  350 MG/ML SOLN COMPARISON:  CT angiogram chest 12/30/2023.  FINDINGS: Cardiovascular: Satisfactory opacification of the pulmonary arteries  to the segmental level. No evidence of pulmonary embolism. Normal heart size. No pericardial effusion. There are atherosclerotic calcifications of the aorta. Mediastinum/Nodes: No enlarged mediastinal, hilar, or axillary lymph nodes. Thyroid  gland, trachea, and esophagus demonstrate no significant findings. Lungs/Pleura: Small bilateral pleural effusions, left greater than right, has increased from prior. There is atelectasis in the bilateral lower lobes. There is central peribronchial wall thickening diffusely. Additional patchy ground-glass and airspace opacities are seen centrally in the right upper lobe and left upper lobe, new from prior. Focal masslike densities in the left upper lobe and right lower lobe with fiduciary markers appear grossly unchanged. There is no evidence for pneumothorax. Trachea and central airways are patent. Upper Abdomen: No acute abnormality. Musculoskeletal: No chest wall abnormality. No acute or significant osseous findings. Review of the MIP images confirms the above findings. IMPRESSION: 1. No evidence for pulmonary embolism. 2. Small bilateral pleural effusions, left greater than right, have increased from prior. 3. Patchy ground-glass and airspace opacities centrally in the right upper lobe and left upper lobe, new from prior, worrisome for infection or inflammation. 4. Central peribronchial wall thickening diffusely. 5. Stable masslike densities in the left upper lobe and right lower lobe with fiduciary markers. 6. Aortic atherosclerosis. Aortic Atherosclerosis (ICD10-I70.0). Electronically Signed   By: Greig Pique M.D.   On: 01/22/2024 23:19   DG Chest Portable 1 View Result Date: 01/22/2024 CLINICAL DATA:  Dyspnea. EXAM: PORTABLE CHEST 1 VIEW COMPARISON:  01/17/2024 FINDINGS: Cardiac enlargement. Increasing coarse perihilar infiltrates in the lungs since prior study. This may represent developing edema or pneumonia. The known left upper lobe mass is obscured by the  parenchymal process. No pleural effusion. No pneumothorax. Surgical clips projected over the hila. Calcification of the aorta. Degenerative changes in the spine and shoulders. IMPRESSION: Developing coarse perihilar infiltrates may indicate pneumonia or edema. Electronically Signed   By: Elsie Gravely M.D.   On: 01/22/2024 21:33   IR Facet JT Inj C/T  Single Level Right W/FL/CT Result Date: 01/21/2024 CLINICAL DATA:  Patient with metastatic non small cell lung cancer with focal pain at the T9 costovertebral junction with metastatic disease involving the T9 vertebrae and associated transverse process. Planned perivertebral block on the right to involve the T8, T9, and T10 ANESTHESIA/SEDATION: Moderate (conscious) sedation was employed during this procedure. A total of Versed  2 mg and Fentanyl  150 mcg was administered intravenously by the radiology nurse. Total intra-service moderate Sedation Time: 27 minutes. The patient's level of consciousness and vital signs were monitored continuously by radiology nursing throughout the procedure under my direct supervision. EXAM: Paravertebral block 3 levels right-sided With the patient in a prone position a a posterior approach was taken to the paravertebral space at 3 separate levels on the patient. Initially fluoroscopy was used to identify the T9 sclerotic rib and marked the ribs and transverse processes associated with T8, T9, and T10 on the right side. After the patient's skin was marked for the appropriate levels, ultrasound was used to evaluate the paravertebral space. The ultrasound probe was then oriented to place the transverse process and the associated rib in in orientation to highlight the superior costo transverse ligament and internal intercostal membrane. Beginning at the T8 level, all 3 levels were anesthetized with 1% lidocaine  by advancing the needle in the expected trajectory under ultrasound guidance. After infiltrating the regions with 1% lidocaine  a  mixture of bupivacaine  50% and triamcinolone  1 milligram/milliliter (total 40 mg)  was mixed at the table and separated into 3 syringes. Each syringe was then used to infiltrate the paravertebral space by advancing the needle from the skin through the superior costo transverse ligament to penetrate the region of interest. Once the needle identified in the correct space, each space was aspirated and when no blood was returned, the mixture was then infiltrated into the paravertebral space and watching as the parietal pleura was deflected down words demonstrating satisfactory position. After all 3 levels (T8-T9 and T10 were infiltrated) were satisfactory filled with bupivacaine  and triamcinolone  all needles were removed and sterile dressing was applied. FLUOROSCOPY: Radiation Exposure Index (as provided by the fluoroscopic device): 0.3 mGy Kerma IMPRESSION: Successful right-sided paravertebral block for T8, T9, and T10. The patient will be followed clinically. Electronically Signed   By: Cordella Banner   On: 01/21/2024 08:46   IR Facet JT Inj C/T  2nd Level Right W/FL/CT Result Date: 01/21/2024 CLINICAL DATA:  Patient with metastatic non small cell lung cancer with focal pain at the T9 costovertebral junction with metastatic disease involving the T9 vertebrae and associated transverse process. Planned perivertebral block on the right to involve the T8, T9, and T10 ANESTHESIA/SEDATION: Moderate (conscious) sedation was employed during this procedure. A total of Versed  2 mg and Fentanyl  150 mcg was administered intravenously by the radiology nurse. Total intra-service moderate Sedation Time: 27 minutes. The patient's level of consciousness and vital signs were monitored continuously by radiology nursing throughout the procedure under my direct supervision. EXAM: Paravertebral block 3 levels right-sided With the patient in a prone position a a posterior approach was taken to the paravertebral space at 3 separate  levels on the patient. Initially fluoroscopy was used to identify the T9 sclerotic rib and marked the ribs and transverse processes associated with T8, T9, and T10 on the right side. After the patient's skin was marked for the appropriate levels, ultrasound was used to evaluate the paravertebral space. The ultrasound probe was then oriented to place the transverse process and the associated rib in in orientation to highlight the superior costo transverse ligament and internal intercostal membrane. Beginning at the T8 level, all 3 levels were anesthetized with 1% lidocaine  by advancing the needle in the expected trajectory under ultrasound guidance. After infiltrating the regions with 1% lidocaine  a mixture of bupivacaine  50% and triamcinolone  1 milligram/milliliter (total 40 mg) was mixed at the table and separated into 3 syringes. Each syringe was then used to infiltrate the paravertebral space by advancing the needle from the skin through the superior costo transverse ligament to penetrate the region of interest. Once the needle identified in the correct space, each space was aspirated and when no blood was returned, the mixture was then infiltrated into the paravertebral space and watching as the parietal pleura was deflected down words demonstrating satisfactory position. After all 3 levels (T8-T9 and T10 were infiltrated) were satisfactory filled with bupivacaine  and triamcinolone  all needles were removed and sterile dressing was applied. FLUOROSCOPY: Radiation Exposure Index (as provided by the fluoroscopic device): 0.3 mGy Kerma IMPRESSION: Successful right-sided paravertebral block for T8, T9, and T10. The patient will be followed clinically. Electronically Signed   By: Cordella Banner   On: 01/21/2024 08:46   IR Facet JT Inj C/T 3rd Plus Level Right W/Fl/CT Result Date: 01/21/2024 CLINICAL DATA:  Patient with metastatic non small cell lung cancer with focal pain at the T9 costovertebral junction with  metastatic disease involving the T9 vertebrae and associated transverse process. Planned perivertebral  block on the right to involve the T8, T9, and T10 ANESTHESIA/SEDATION: Moderate (conscious) sedation was employed during this procedure. A total of Versed  2 mg and Fentanyl  150 mcg was administered intravenously by the radiology nurse. Total intra-service moderate Sedation Time: 27 minutes. The patient's level of consciousness and vital signs were monitored continuously by radiology nursing throughout the procedure under my direct supervision. EXAM: Paravertebral block 3 levels right-sided With the patient in a prone position a a posterior approach was taken to the paravertebral space at 3 separate levels on the patient. Initially fluoroscopy was used to identify the T9 sclerotic rib and marked the ribs and transverse processes associated with T8, T9, and T10 on the right side. After the patient's skin was marked for the appropriate levels, ultrasound was used to evaluate the paravertebral space. The ultrasound probe was then oriented to place the transverse process and the associated rib in in orientation to highlight the superior costo transverse ligament and internal intercostal membrane. Beginning at the T8 level, all 3 levels were anesthetized with 1% lidocaine  by advancing the needle in the expected trajectory under ultrasound guidance. After infiltrating the regions with 1% lidocaine  a mixture of bupivacaine  50% and triamcinolone  1 milligram/milliliter (total 40 mg) was mixed at the table and separated into 3 syringes. Each syringe was then used to infiltrate the paravertebral space by advancing the needle from the skin through the superior costo transverse ligament to penetrate the region of interest. Once the needle identified in the correct space, each space was aspirated and when no blood was returned, the mixture was then infiltrated into the paravertebral space and watching as the parietal pleura was  deflected down words demonstrating satisfactory position. After all 3 levels (T8-T9 and T10 were infiltrated) were satisfactory filled with bupivacaine  and triamcinolone  all needles were removed and sterile dressing was applied. FLUOROSCOPY: Radiation Exposure Index (as provided by the fluoroscopic device): 0.3 mGy Kerma IMPRESSION: Successful right-sided paravertebral block for T8, T9, and T10. The patient will be followed clinically. Electronically Signed   By: Cordella Banner   On: 01/21/2024 08:46   Assessment and Plan:  Acute HFrEF Patient presented with acute hypoxic respiratory failure Initially required BiPAP, weaned to baseline CXR concerning for pneumonia Echo this admission showed: EF 30 to 35%, LV RWMA, mild LVH, mildly reduced RV function, normal IVC Patient has been receiving IV diuresis Creatinine stable this admission Hypokalemia, most recent potassium 3.0 Magnesium 1.9 Net - 5.6 L this admission Cardiomyopathy could be secondary to immunotherapy, stress CM, or coronary artery disease Discussed all the options with the patient and he prefers to proceed with more conservative, medical management  Plans for patient to go through hospital at home program Discussed options with patient, he would like to proceed with medical management and follow up echocardiogram in ~3 months  Patient deferred any invasive ischemic evaluations at this time  Stop IV Lasix   Start Entresto 24-26 mg BID  Start spironolactone 12.5 mg daily Start PO Lasix  40 mg daily Would defer SGLT2i with frequent UTIs Will get follow up with advanced heart failure clinic  Check BMP in ~5 days, can do at future follow ups  Per primary Metastatic SCLC Pneumonia SIRS Concern for bacteremia  Risk Assessment/Risk Scores:     [place]  [employer] (NYHA) Functional Class NYHA Class II    For questions or updates, please contact [facility] Please consult [email or web address] for contact info under     Signed, Waddell DELENA Donath, PA-C  01/24/2024 2:01 PM

## 2024-01-24 NOTE — Plan of Care (Addendum)
 Hospital At Home Progress Note    Chad Moreno   FMW:[id number]  DOB: 1940/12/08  DOA: 01/22/2024     1 Date of Service: 01/24/2024   Brief Narrative / Interim history: 83 y.o. male with medical history significant of metastatic small cell lung cancer status post SBRT and currently on immunotherapy, chronic thrombocytopenia, hypertension, hyperlipidemia, gout, GERD. Recent hospital admission 7/7-7/16 for SBO, GI bleed from cecal colitis/diverticulosis, ESBL UTI, and AKI. Patient presents to the ED via EMS for evaluation of acute onset shortness of breath/respiratory distress. He was found to have findings concerning for pneumonia and fluid overload, was placed on antibiotics and diuretics and admitted to the hospital. His blood cultures were positive for ESBL E. Coli, ID were consulted and recommended 7 days of antibiotics and follow-up. TTE showed newly reduced EF, cardiology was consulted and recommended medical therapy. Admitted to [facility] program on 8/1.   Subjective:  MD and two hospital at home team members met with patient and spouse at the bedside. Patient laying comfortably in bed on room air.  Hospital at home program was discussed with patient and spouse in depth and all questions were answered. Patient agreed to transfer to hospital at home service later today and spouse signed the consent form.  Hospital Problems Assessment and Plan:  1) ESBL E. coli bacteremia - Bcx obtained and speciated on morning of 7/31, 1/4 bottles of ESBL E. coli. Started on meropenem . Last time he was hospitalized he was found to have 10^5 CFU's ESBL E. coli in the urine culture. He reported dysuria at that time and burning with urination. He has been afebrile with normal white count. Infectious disease was consulted and they recommend 7 total days of antibiotic (end date 8/6). Abx changed to ertapenem in anticipation to transfer to [facility] program. Repeat bcx obtained on 7/31 @ 6:50 PM and  6:55 PM.   2) Acute systolic heart failure- Presented with hypoxia. He was placed on IV Lasix  40 mg BID, diuresed well and is net -5 L, down 2 lbs. TTE on 8/1 showed EF 30-35%, LV RWMA, mild LVH, mildly reduced RV function, normal IVC. Cardiology was consulted. Cardiomyopathy thought to be likely secondary to his immunotherapy, stress CM, or ischemia. Patient deferred any invasive ischemic evaluation but was agreeable to medical management. Patient started on Lasix  40 mg daily, Entresto 24-26 mg twice daily and spironolactone 12.5 mg daily with plan for repeat TTE in 3 months.  3) Acute hypoxic respiratory failure, resolved- Initially thought to be due to pneumonia and fluid overload. initially required BiPAP, he was placed on IV doxycycline  and IV lasix , and weaned off to room air on 8/1, respiratory status much improved and he is back to baseline. Repeat CXR shows improvement in aeration, doxycycline  discontinued on 8/1.   4 ) Metastatic small cell lung cancer- Metastatic non-small cell lung cancer, adenocarcinoma, with BRAF V600E mutation and PD-L1 expression of 1%. Diagnosed in May 2024 with malignant pleural effusion noted in December 2024. Currently on encorafenib  and binimetinib  since July 22, 2023. He reports recent diarrhea for current immunotherapy and new onset systolic heart failure. Would like to discuss with his Oncologist, Dr. [name], about stopping or starting an alternative treatment. Has follow up w/ oncology on Tuesday, 8/5 that he DOES NOT want to miss. Discussed with Dr. Jeannett team and they have changed his appt to a virtual visit on Tuesday at 10:00 am. Also needs the follow labs Monday: CBC w/ diff, CMP,  iron  and TIBC, ferritin, folate and vitamin B12  5) Anemia of chronic disease - Anemia likely from malignancy. Hgb stable at 10.3. Trending CBC  6) Troponin elevation- Troponin 27->124. No chest pain or ischemic changes on EKG. Secondary to demand ischemia.   7)  Hypokalemia- Potassium down to 3.1 today, repleted with KCl 40 mEq x1. Trend BMP and Mag  8) Unstageable pressure injury to R/L buttocks, POA- Wound care consulted, appreciate recs.   Objective Vital signs were reviewed and unremarkable.  Exam Constitutional: NAD Eyes: no scleral icterus ENMT: Mucous membranes are moist.  Neck: normal, supple Respiratory: clear to auscultation bilaterally, no wheezing, no crackles. Cardiovascular: Regular rate and rhythm, no murmurs / rubs / gallops. No LE edema.  Abdomen: non distended, no tenderness. Bowel sounds positive.  Musculoskeletal: no clubbing / cyanosis.   Labs / Other Information    Latest Ref Rng & Units 01/24/2024    2:18 AM 01/23/2024    3:46 AM 01/22/2024    9:25 PM  CBC  WBC 4.0 - 10.5 K/uL 7.9  7.9  7.8   Hemoglobin 13.0 - 17.0 g/dL 9.9  89.6  89.4   Hematocrit 39.0 - 52.0 % 30.9  32.3  33.2   Platelets 150 - 400 K/uL 163  140  165       Latest Ref Rng & Units 01/24/2024    2:18 AM 01/23/2024    3:46 AM 01/22/2024    9:25 PM  CMP  Glucose 70 - 99 mg/dL 882  788  870   BUN 8 - 23 mg/dL 14  12  15    Creatinine 0.61 - 1.24 mg/dL 9.12  9.10  9.18   Sodium 135 - 145 mmol/L 145  141  141   Potassium 3.5 - 5.1 mmol/L 3.0  3.1  3.7   Chloride 98 - 111 mmol/L 105  104  107   CO2 22 - 32 mmol/L 29  24  22    Calcium 8.9 - 10.3 mg/dL 8.3  8.5  8.4   Total Protein 6.5 - 8.1 g/dL 5.7   6.1   Total Bilirubin 0.0 - 1.2 mg/dL 0.9   1.0   Alkaline Phos 38 - 126 U/L 56   60   AST 15 - 41 U/L 20   39   ALT 0 - 44 U/L 23   30     CT Angio Chest PE W and/or Wo Contrast Result Date: 01/22/2024 CLINICAL DATA:  High probability for PE. History of non-small cell lung cancer. EXAM: CT ANGIOGRAPHY CHEST WITH CONTRAST TECHNIQUE: Multidetector CT imaging of the chest was performed using the standard protocol during bolus administration of intravenous contrast. Multiplanar CT image reconstructions and MIPs were obtained to evaluate the vascular  anatomy. RADIATION DOSE REDUCTION: This exam was performed according to the departmental dose-optimization program which includes automated exposure control, adjustment of the mA and/or kV according to patient size and/or use of iterative reconstruction technique. CONTRAST:  75mL OMNIPAQUE  IOHEXOL  350 MG/ML SOLN COMPARISON:  CT angiogram chest 12/30/2023. FINDINGS: Cardiovascular: Satisfactory opacification of the pulmonary arteries to the segmental level. No evidence of pulmonary embolism. Normal heart size. No pericardial effusion. There are atherosclerotic calcifications of the aorta. Mediastinum/Nodes: No enlarged mediastinal, hilar, or axillary lymph nodes. Thyroid  gland, trachea, and esophagus demonstrate no significant findings. Lungs/Pleura: Small bilateral pleural effusions, left greater than right, has increased from prior. There is atelectasis in the bilateral lower lobes. There is central peribronchial wall thickening diffusely. Additional patchy ground-glass  and airspace opacities are seen centrally in the right upper lobe and left upper lobe, new from prior. Focal masslike densities in the left upper lobe and right lower lobe with fiduciary markers appear grossly unchanged. There is no evidence for pneumothorax. Trachea and central airways are patent. Upper Abdomen: No acute abnormality. Musculoskeletal: No chest wall abnormality. No acute or significant osseous findings. Review of the MIP images confirms the above findings. IMPRESSION: 1. No evidence for pulmonary embolism. 2. Small bilateral pleural effusions, left greater than right, have increased from prior. 3. Patchy ground-glass and airspace opacities centrally in the right upper lobe and left upper lobe, new from prior, worrisome for infection or inflammation. 4. Central peribronchial wall thickening diffusely. 5. Stable masslike densities in the left upper lobe and right lower lobe with fiduciary markers. 6. Aortic atherosclerosis. Aortic  Atherosclerosis (ICD10-I70.0). Electronically Signed   By: Greig Pique M.D.   On: 01/22/2024 23:19   DG Chest Portable 1 View Result Date: 01/22/2024 CLINICAL DATA:  Dyspnea. EXAM: PORTABLE CHEST 1 VIEW COMPARISON:  01/17/2024 FINDINGS: Cardiac enlargement. Increasing coarse perihilar infiltrates in the lungs since prior study. This may represent developing edema or pneumonia. The known left upper lobe mass is obscured by the parenchymal process. No pleural effusion. No pneumothorax. Surgical clips projected over the hila. Calcification of the aorta. Degenerative changes in the spine and shoulders. IMPRESSION: Developing coarse perihilar infiltrates may indicate pneumonia or edema. Electronically Signed   By: Elsie Gravely M.D.   On: 01/22/2024 21:33       Time spent: 1 [facility] 01/24/2024, 2:54 PM  Hospital at Home Admission Criteria Checklist:  Formal consent explained in detail and signed at the bedside: YES Patient meets inpatient admission criteria (see below for further details)  Is pt medicare FFS( required for initial launch with plan to expand)? YES Lives within 25 mil/ 30 min from [facility] within [place](pt may stay with family member during admission who lives within 25 miles or 30 min from [facility] w[facility]in [facility]) ? YES  Hemodynamically stable with relatively low risk of clinical deterioration-not requiring ICU? YES Age >55?YES Does not require frequent touch-points or complex interventions/medications (ie Titrated Infusions (IV insulin, heparin  drips, vasoactive drips, use of infused or injectable controlled substances, patients on insulin)? NO Any Behavioral Health comorbidities likely to increase risk for in-home care (ie Acute delirium or experiencing a marked altered mental status and cause is not a treatable condition in the home)? NO  Has the patient been on BIPAP during course of ED evaluation or hospitalization?YES IF YES, Has the patient been off of  BIPAP for >24 hours(If NO-THEN PATIENT DOES MEET INCLUSION CRITERIA)?YES Needs oxygen at home (<6L)? NO Active safety concerns (ie Unable to use bedside commode independently and lacks caregiver support for safety- needs SNF placement, unable to obtain IV access)? NO Has skin check been performed?YES Has Physical Therapy screened the patient?YES Common admission diagnoses including: CAP, COPD Exacerbation, Acute on chronic heart failure, Cellulitis, UTI , dehydration, acute resp failure with hypoxia (requiring <5L)   Social Screening: Denies significant ETOH intake? YES Does not smoke and understands may not smoke in the presence of oxygen? YES  Patient states able to use iPad/phone for communication/has family who is able to use? YES  Patient has agreed to be compliant with medication and treatment regimen of the program?YES Any active drug use in patient or primary caregiver including daily dosing of methadone?  NO Stable home environment ( access to appropriate  heating in cold conditions and/or appropriate air conditioning in hot conditions and/or no running water/electricity)? YES  No aggressive pets at home? NO Firearm present  with ability or willingness to store them unloaded in a locked case for duration of hospitalization? N/A  Ambulatory? YES (independently cane, walker  wheelchair bound, bed bound) Bed bugs present on home evaluation?NO  Family support system in place? YES Patient feels safe at home does not endorse any violence? YES Any actively decompensated behavioral healthy issues including agitation/aggressive behavior? NO   Patient requests food to be provided by hospital home program?NO PT/OT eval completed and not requiring SNF, ALF, inpatient rehab? YES   To be admitted to the [facility] at [facility] program, a patient generally must meet the following: 1. Requirement for Inpatient Level of Care: The patient's condition must necessitate an inpatient level of care. This is  typically indicated by one or more of the following, depending on their specific diagnosis: -Persistent tachycardia despite appropriate treatment (e.g., for Heart Failure, UTI). - Persistent tachypnea (rapid breathing) or dyspnea (shortness of breath) that hasn't improved sufficiently with observation care (e.g., for Heart Failure, Pneumonia, Viral Illness, COVID). - Hypoxemia (low oxygen levels), such as a new need for oxygen, an increased need from baseline, or specific oxygen saturation levels (e.g., SpO2 <90-94% depending on the condition) that persist despite observation (e.g., for Heart Failure, COPD, Pneumonia, Viral Illness, COVID). - Need for Intravenous (IV) hydration due to an inability to maintain oral hydration, which persists despite observation care (e.g., for Cellulitis, UTI, Viral Illness, COVID). - Specific to Heart Failure: Persistent pulmonary edema, indicated by a new oxygen need, lack of improvement with IV diuretics, and ongoing tachypnea/dyspnea. - Specific to COPD: A decrease in known baseline resting oxygen saturation (SpO2) by 4% or more, or an increase in pre-existing supplemental oxygen requirements, which persists despite observation and requires continued close monitoring. - Specific to Pneumonia: A Pneumonia Severity Index (PSI) class IV (moderate risk). - Specific to Cellulitis: Failure of outpatient antibiotic therapy (indicated by progression or no improvement after a minimum of 48 hours on an adequate regimen) or a clinical presentation (like acuity or rapidity of progression) that requires the intensity of monitoring found in an inpatient setting. - Specific to UTI: Persistence or worsening of clinical findings like fever, pain, or dehydration despite observation care; presence of significant uropathy; suspected infection of an indwelling prosthetic device, stent, implant, or graft; or pregnancy with suspected pyelonephritis. 2. Appropriateness for  Hospital at Home Setting: The patient's overall clinical picture, including the severity of their illness, their care needs, and their medical history and comorbidities, must be suitable for management in the Hospital at Home environment. This essentially means that none of the exclusion criteria (listed below) are met.  Unified Exclusion Criteria for Hospital at Home Admission: A patient would not be eligible for Hospital at Home if any of the following are present: - Hemodynamic Instability: - Hypotension (low blood pressure) is present. - Respiratory Instability or Needs Beyond Program Capability: - There is a new need for invasive or noninvasive ventilatory assistance (like BiPAP or a ventilator). - Oxygenation is not sufficient, generally indicated if an FiO2 (fraction of inspired oxygen) of 45% (which is about 6 Liters/minute via nasal cannula) or more is required to keep oxygen saturation (SpO2) at 90% or greater. - Monitoring or Procedural Needs Beyond Program Capability: - There is a need for invasive monitoring, such as a pulmonary artery catheter or an arterial line. - There is a  need for immediate-response telemetry monitoring (for dangerous arrhythmia detection and subsequent immediate intervention). - The required medication regimen is beyond the capabilities of Hospital at Home (e.g., dosing intervals are too frequent for home administration). - There is a need for a procedure that cannot be performed by the [facility] team (e.g., significant wound debridement or abscess drainage for cellulitis, or percutaneous nephrostomy for a complicated UTI). - Significant Organ Dysfunction or Markers of Severe Illness: - Mental status is not at baseline, or there is altered mental status suggestive of inadequate perfusion. - Renal (kidney) function is unstable or showing an ongoing decline. - There is evidence of inadequate perfusion, such as metabolic acidosis or myocardial  ischemia. - Uncompensated acidosis is present. - Condition-Specific Severity or Complications Making Home Care Unsuitable: -For Heart Failure: Known severe cardiac valvular disease (e.g., aortic stenosis, mitral regurgitation); or severe peripheral edema that impairs the ability to urinate or ambulate. -For COPD: Known concurrent comorbidity or finding that indicates a higher-risk COPD exacerbation (e.g., pulmonary fibrosis, cavitation, pleural effusion, pneumothorax, rib fracture). -For Pneumonia: Pneumonia Severity Index (PSI) class V (indicating high risk for inpatient mortality); known concurrent comorbidity or finding that indicates higher-risk pneumonia (e.g., pulmonary fibrosis, cavitation, large or loculated pleural effusion); or a concomitant serious infectious process like endocarditis or empyema. -For Cellulitis: Orbital, periorbital, or necrotizing infection is suspected; or a concomitant serious infectious process like endocarditis, septic emboli, or septic joint space infection. -For UTI: Urinary tract obstruction (e.g., kidney stone, bladder outlet obstruction); or a concomitant serious infectious process like endocarditis or septic emboli. -For Viral Illness & COVID-19: A concomitant serious infectious process like endocarditis or empyema. General Comorbidities or Status: -The patient is significantly immunosuppressed (this applies to Pneumonia, Cellulitis, UTI, Viral Illness, and COVID-19). -The patient meets inpatient admission criteria for a second diagnosis, or has care needs beyond the capabilities of Hospital at Home due to an active clinically significant comorbidity. (This is a general exclusion across all listed conditions)

## 2024-01-24 NOTE — Progress Notes (Signed)
 PROGRESS NOTE  Chad Moreno FMW:[id number] DOB: 12/26/1940 DOA: 01/22/2024 PCP: [name], MD   LOS: 1 day   Brief Narrative / Interim history: 83 y.o. male with medical history significant of metastatic small cell lung cancer status post SBRT and currently on immunotherapy, chronic thrombocytopenia, hypertension, hyperlipidemia, gout, GERD. Recent hospital admission 7/7-7/16 for SBO, GI bleed from cecal colitis/diverticulosis, ESBL UTI, and AKI. Patient presents to the ED via EMS for evaluation of acute onset shortness of breath/respiratory distress.  He was found to have findings concerning for pneumonia and fluid overload, was placed on antibiotics and diuretics and admitted to the hospital  Subjective / 24h Interval events: He is doing well this morning, feeling a whole lot better.  He is on room air  Assesement and Plan: Principal Problem:   CAP (community acquired pneumonia) Active Problems:   Acute hypoxemic respiratory failure (HCC)   Acute CHF (congestive heart failure) (HCC)   SIRS (systemic inflammatory response syndrome) (HCC)   Elevated troponin   CHF exacerbation (HCC)  Principal problem Acute hypoxic respiratory failure in the setting of possible pneumonia and fluid overload -initially required BiPAP, he was placed on antibiotics and diuretics, and has been weaned off to room air today, respiratory status much improved and he is back to baseline - Repeat two-view chest x-ray, if his pneumonia is resolved with diuretics, does not need doxycycline   Active problems Concern for ESBL E. coli bacteremia -speciated last night 1/4 bottles of ESBL E. coli.  Last time he was hospitalized he was found to have 10^5 CFU's ESBL E. coli in the urine culture.  He reported dysuria at that time and burning with urination, and still has some symptoms today.  He initially did not report this during this admission and urinalysis was not obtained in the ER - ID consulted, appreciate  input  Acute on chronic diastolic CHF-2D echo still pending today.  He was placed on IV Lasix , diuresed well and is net -5 L.  His weight is down about 2 pounds  Troponin elevation-due to demand ischemia  Metastatic small cell lung cancer-outpatient follow-up, currently on immunotherapy  Scheduled Meds:  enoxaparin  (LOVENOX ) injection  40 mg Subcutaneous Q24H   furosemide   40 mg Intravenous BID   Gerhardt's butt cream   Topical BID   Continuous Infusions:  doxycycline  (VIBRAMYCIN ) IV 125 mL/hr at 01/24/24 9487   meropenem  (MERREM ) IV Stopped (01/24/24 0424)   PRN Meds:.acetaminophen  **OR** acetaminophen , ipratropium-albuterol   Current Outpatient Medications  Medication Instructions   B Complex Vitamins (VITAMIN-B COMPLEX) TABS 1 tablet, Daily with breakfast   Braftovi  450 mg, Oral, Daily   calcium carbonate (OSCAL) 1,200 mg, 2 times daily with meals   celecoxib  (CELEBREX ) 200 mg, Oral, 2 times daily   cyanocobalamin (VITAMIN B12) 1,000 mcg, Daily   ferrous sulfate  325 mg, Oral, Daily PRN   loperamide  (IMODIUM  A-D) 2 mg, Oral, 4 times daily PRN   Mektovi  45 mg, Oral, 2 times daily   Nystatin (GERHARDT'S BUTT CREAM) CREA 1 Application, Topical, 4 times daily   ondansetron  (ZOFRAN -ODT) 4 mg, Oral, Every 8 hours PRN   pantoprazole  (PROTONIX ) 40 mg, Oral, 2 times daily   POTASSIUM PO 1 tablet, Daily with breakfast   Probiotic Product (PROBIOTIC PEARLS ADVANTAGE PO) 1 capsule, Every morning   protein supplement shake (PREMIER PROTEIN) LIQD 11 oz, See admin instructions   silodosin  (RAPAFLO ) 8 mg, Daily    Diet Orders (From admission, onward)     Start  Ordered   01/23/24 0259  Diet Heart Room service appropriate? Yes; Fluid consistency: Thin  Diet effective now       Question Answer Comment  Room service appropriate? Yes   Fluid consistency: Thin      01/23/24 0300            DVT prophylaxis: enoxaparin  (LOVENOX ) injection 40 mg Start: 01/23/24 1400   Lab Results   Component Value Date   PLT 163 01/24/2024      Code Status: Full Code  Family Communication: wife at bedside   Status is: Inpatient Remains inpatient appropriate because: severity of illness  Level of care: Telemetry Medical  Consultants:  ID  Objective: Vitals:   01/24/24 0017 01/24/24 0454 01/24/24 0455 01/24/24 0745  BP: 135/76  137/77 131/78  Pulse: 82  85 87  Resp: 19  17 19   Temp: 98.8 F (37.1 C)  98 F (36.7 C) 97.8 F (36.6 C)  TempSrc: Oral  Oral Oral  SpO2: 94%  91% 97%  Weight:  87.4 kg    Height:        Intake/Output Summary (Last 24 hours) at 01/24/2024 0906 Last data filed at 01/24/2024 0818 Gross per 24 hour  Intake 729.75 ml  Output 3300 ml  Net -2570.25 ml   Wt Readings from Last 3 Encounters:  01/24/24 87.4 kg  01/20/24 90.7 kg  01/15/24 91.7 kg    Examination:  Constitutional: NAD Eyes: no scleral icterus ENMT: Mucous membranes are moist.  Neck: normal, supple Respiratory: clear to auscultation bilaterally, no wheezing, no crackles. Cardiovascular: Regular rate and rhythm, no murmurs / rubs / gallops. No LE edema.  Abdomen: non distended, no tenderness. Bowel sounds positive.  Musculoskeletal: no clubbing / cyanosis.   Data Reviewed: I have independently reviewed following labs and imaging studies   CBC Recent Labs  Lab 01/17/24 1020 01/20/24 1003 01/22/24 2125 01/23/24 0346 01/24/24 0218  WBC 6.8 5.3 7.8 7.9 7.9  HGB 9.8* 9.9* 10.5* 10.3* 9.9*  HCT 29.4* 30.3* 33.2* 32.3* 30.9*  PLT 105* 106* 165 140* 163  MCV 86.0 87.8 89.7 89.0 88.8  MCH 28.7 28.7 28.4 28.4 28.4  MCHC 33.3 32.7 31.6 31.9 32.0  RDW 15.5 15.4 16.1* 15.9* 16.7*  LYMPHSABS  --   --  1.5  --   --   MONOABS  --   --  0.6  --   --   EOSABS  --   --  0.0  --   --   BASOSABS  --   --  0.0  --   --     Recent Labs  Lab 01/17/24 1020 01/20/24 1003 01/22/24 2125 01/23/24 0346 01/23/24 0554 01/24/24 0218  NA 139  --  141 141  --  145  K 3.4*  --  3.7  3.1*  --  3.0*  CL 103  --  107 104  --  105  CO2 22  --  22 24  --  29  GLUCOSE 155*  --  129* 211*  --  117*  BUN 29*  --  15 12  --  14  CREATININE 1.21  --  0.81 0.89  --  0.87  CALCIUM 8.8*  --  8.4* 8.5*  --  8.3*  AST 35  --  39  --   --  20  ALT 29  --  30  --   --  23  ALKPHOS 74  --  60  --   --  56  BILITOT 0.6  --  1.0  --   --  0.9  ALBUMIN 3.3*  --  2.9*  --   --  2.7*  MG 1.8  --   --   --   --  1.9  PROCALCITON  --   --   --  0.47  --   --   LATICACIDVEN  --   --   --  2.7* 2.0*  --   INR  --  1.1  --   --   --   --   BNP  --   --  2,201.2*  --   --   --     ------------------------------------------------------------------------------------------------------------------ No results for input(s): CHOL, HDL, LDLCALC, TRIG, CHOLHDL, LDLDIRECT in the last 72 hours.  No results found for: HGBA1C ------------------------------------------------------------------------------------------------------------------ No results for input(s): TSH, T4TOTAL, T3FREE, THYROIDAB in the last 72 hours.  Invalid input(s): FREET3  Cardiac Enzymes No results for input(s): CKMB, TROPONINI, MYOGLOBIN in the last 168 hours.  Invalid input(s): CK ------------------------------------------------------------------------------------------------------------------    Component Value Date/Time   BNP 2,201.2 (H) 01/22/2024 2125    CBG: No results for input(s): GLUCAP in the last 168 hours.  Recent Results (from the past 240 hours)  Resp panel by RT-PCR (RSV, Flu A&B, Covid) Anterior Nasal Swab     Status: None   Collection Time: 01/22/24  9:26 PM   Specimen: Anterior Nasal Swab  Result Value Ref Range Status   SARS Coronavirus 2 by RT PCR NEGATIVE NEGATIVE Final   Influenza A by PCR NEGATIVE NEGATIVE Final   Influenza B by PCR NEGATIVE NEGATIVE Final    Comment: (NOTE) The Xpert Xpress SARS-CoV-2/FLU/RSV plus assay is intended as an aid in the  diagnosis of influenza from Nasopharyngeal swab specimens and should not be used as a sole basis for treatment. Nasal washings and aspirates are unacceptable for Xpert Xpress SARS-CoV-2/FLU/RSV testing.  Fact Sheet for Patients: BloggerCourse.com  Fact Sheet for Healthcare Providers: SeriousBroker.it  This test is not yet approved or cleared by the [place]  FDA and has been authorized for detection and/or diagnosis of SARS-CoV-2 by FDA under an Emergency Use Authorization (EUA). This EUA will remain in effect (meaning this test can be used) for the duration of the COVID-19 declaration under Section 564(b)(1) of the Act, 21 U.S.C. section 360bbb-3(b)(1), unless the authorization is terminated or revoked.     Resp Syncytial Virus by PCR NEGATIVE NEGATIVE Final    Comment: (NOTE) Fact Sheet for Patients: BloggerCourse.com  Fact Sheet for Healthcare Providers: SeriousBroker.it  This test is not yet approved or cleared by the [place]  FDA and has been authorized for detection and/or diagnosis of SARS-CoV-2 by FDA under an Emergency Use Authorization (EUA). This EUA will remain in effect (meaning this test can be used) for the duration of the COVID-19 declaration under Section 564(b)(1) of the Act, 21 U.S.C. section 360bbb-3(b)(1), unless the authorization is terminated or revoked.  Performed at [facility], [address], [place], [place] [address]   Culture, blood (Routine X 2) w Reflex to ID Panel     Status: None (Preliminary result)   Collection Time: 01/23/24  3:46 AM   Specimen: BLOOD RIGHT FOREARM  Result Value Ref Range Status   Specimen Description BLOOD RIGHT FOREARM  Final   Special Requests   Final    BOTTLES DRAWN AEROBIC AND ANAEROBIC Blood Culture adequate volume   Culture  Setup Time   Final  GRAM NEGATIVE RODS BOTTLES DRAWN AEROBIC  ONLY CRITICAL RESULT CALLED TO, READ BACK BY AND VERIFIED WITH: [facility] AMDREW GRETEL [id number] AT 1748 BY EC Performed at [facility], [address], [place], [place] [address]    Culture GRAM NEGATIVE RODS  Final   Report Status PENDING  Incomplete  Culture, blood (Routine X 2) w Reflex to ID Panel     Status: None (Preliminary result)   Collection Time: 01/23/24  3:46 AM   Specimen: BLOOD LEFT HAND  Result Value Ref Range Status   Specimen Description BLOOD LEFT HAND  Final   Special Requests   Final    BOTTLES DRAWN AEROBIC AND ANAEROBIC Blood Culture adequate volume   Culture   Final    NO GROWTH 1 DAY Performed at [facility], [address] [place], [place] [address]    Report Status PENDING  Incomplete  Blood Culture ID Panel (Reflexed)     Status: Abnormal   Collection Time: 01/23/24  3:46 AM  Result Value Ref Range Status   Enterococcus faecalis NOT DETECTED NOT DETECTED Final   Enterococcus Faecium NOT DETECTED NOT DETECTED Final   Listeria monocytogenes NOT DETECTED NOT DETECTED Final   Staphylococcus species NOT DETECTED NOT DETECTED Final   Staphylococcus aureus (BCID) NOT DETECTED NOT DETECTED Final   Staphylococcus epidermidis NOT DETECTED NOT DETECTED Final   Staphylococcus lugdunensis NOT DETECTED NOT DETECTED Final   Streptococcus species NOT DETECTED NOT DETECTED Final   Streptococcus agalactiae NOT DETECTED NOT DETECTED Final   Streptococcus pneumoniae NOT DETECTED NOT DETECTED Final   Streptococcus pyogenes NOT DETECTED NOT DETECTED Final   A.calcoaceticus-baumannii NOT DETECTED NOT DETECTED Final   Bacteroides fragilis NOT DETECTED NOT DETECTED Final   Enterobacterales DETECTED (A) NOT DETECTED Final    Comment: Enterobacterales represent a large order of gram negative bacteria, not a single organism. CRITICAL RESULT CALLED TO, READ BACK BY AND VERIFIED WITH: PHARMD ANDREW MEYER [id number] AT 1748 BY EC    Enterobacter cloacae complex NOT  DETECTED NOT DETECTED Final   Escherichia coli DETECTED (A) NOT DETECTED Final    Comment: CRITICAL RESULT CALLED TO, READ BACK BY AND VERIFIED WITH: PHARMD PRENTICE GRETEL [id number] AT 1748 BY EC    Klebsiella aerogenes NOT DETECTED NOT DETECTED Final   Klebsiella oxytoca NOT DETECTED NOT DETECTED Final   Klebsiella pneumoniae NOT DETECTED NOT DETECTED Final   Proteus species NOT DETECTED NOT DETECTED Final   Salmonella species NOT DETECTED NOT DETECTED Final   Serratia marcescens NOT DETECTED NOT DETECTED Final   Haemophilus influenzae NOT DETECTED NOT DETECTED Final   Neisseria meningitidis NOT DETECTED NOT DETECTED Final   Pseudomonas aeruginosa NOT DETECTED NOT DETECTED Final   Stenotrophomonas maltophilia NOT DETECTED NOT DETECTED Final   Candida albicans NOT DETECTED NOT DETECTED Final   Candida auris NOT DETECTED NOT DETECTED Final   Candida glabrata NOT DETECTED NOT DETECTED Final   Candida krusei NOT DETECTED NOT DETECTED Final   Candida parapsilosis NOT DETECTED NOT DETECTED Final   Candida tropicalis NOT DETECTED NOT DETECTED Final   Cryptococcus neoformans/gattii NOT DETECTED NOT DETECTED Final   CTX-M ESBL DETECTED (A) NOT DETECTED Final    Comment: CRITICAL RESULT CALLED TO, READ BACK BY AND VERIFIED WITH: PHARMD PRENTICE GRETEL [id number] AT 1748 BY EC (NOTE) Extended spectrum beta-lactamase detected. Recommend a carbapenem as initial therapy.      Carbapenem resistance IMP NOT DETECTED NOT DETECTED Final   Carbapenem resistance KPC NOT  DETECTED NOT DETECTED Final   Carbapenem resistance NDM NOT DETECTED NOT DETECTED Final   Carbapenem resist OXA 48 LIKE NOT DETECTED NOT DETECTED Final   Carbapenem resistance VIM NOT DETECTED NOT DETECTED Final    Comment: Performed at [facility], [address] [place], [place] [address]  Respiratory (~20 pathogens) panel by PCR     Status: None   Collection Time: 01/23/24  4:15 AM   Specimen: Nasopharyngeal Swab; Respiratory   Result Value Ref Range Status   Adenovirus NOT DETECTED NOT DETECTED Final   Coronavirus 229E NOT DETECTED NOT DETECTED Final    Comment: (NOTE) The Coronavirus on the Respiratory Panel, DOES NOT test for the novel  Coronavirus (2019 nCoV)    Coronavirus HKU1 NOT DETECTED NOT DETECTED Final   Coronavirus NL63 NOT DETECTED NOT DETECTED Final   Coronavirus OC43 NOT DETECTED NOT DETECTED Final   Metapneumovirus NOT DETECTED NOT DETECTED Final   Rhinovirus / Enterovirus NOT DETECTED NOT DETECTED Final   Influenza A NOT DETECTED NOT DETECTED Final   Influenza B NOT DETECTED NOT DETECTED Final   Parainfluenza Virus 1 NOT DETECTED NOT DETECTED Final   Parainfluenza Virus 2 NOT DETECTED NOT DETECTED Final   Parainfluenza Virus 3 NOT DETECTED NOT DETECTED Final   Parainfluenza Virus 4 NOT DETECTED NOT DETECTED Final   Respiratory Syncytial Virus NOT DETECTED NOT DETECTED Final   Bordetella pertussis NOT DETECTED NOT DETECTED Final   Bordetella Parapertussis NOT DETECTED NOT DETECTED Final   Chlamydophila pneumoniae NOT DETECTED NOT DETECTED Final   Mycoplasma pneumoniae NOT DETECTED NOT DETECTED Final    Comment: Performed at [facility], [address] [place], [place] [address]  Culture, blood (Routine X 2) w Reflex to ID Panel     Status: None (Preliminary result)   Collection Time: 01/23/24  6:50 PM   Specimen: BLOOD RIGHT HAND  Result Value Ref Range Status   Specimen Description BLOOD RIGHT HAND  Final   Special Requests   Final    BOTTLES DRAWN AEROBIC ONLY Blood Culture results may not be optimal due to an inadequate volume of blood received in culture bottles   Culture   Final    NO GROWTH < 12 HOURS Performed at [facility], [address], [place], [place] [address]    Report Status PENDING  Incomplete  Culture, blood (Routine X 2) w Reflex to ID Panel     Status: None (Preliminary result)   Collection Time: 01/23/24  6:55 PM   Specimen: BLOOD LEFT HAND   Result Value Ref Range Status   Specimen Description BLOOD LEFT HAND  Final   Special Requests   Final    BOTTLES DRAWN AEROBIC ONLY Blood Culture results may not be optimal due to an inadequate volume of blood received in culture bottles   Culture   Final    NO GROWTH < 12 HOURS Performed at [facility], [address] [place], [place] [address]    Report Status PENDING  Incomplete     Radiology Studies: No results found.   Nilda Fendt, MD, PhD [facility]  Between 7 am - 7 pm I am available, please contact me via Amion (for emergencies) or Securechat (non urgent messages)  Between 7 pm - 7 am I am not available, please contact night coverage MD/APP via Amion

## 2024-01-24 NOTE — Consult Note (Signed)
 Palliative Care Consult Note                                  Date: 01/24/2024   Patient Name: JENNY OMDAHL  DOB: 02-May-1941  MRN: [id number]  Age / Sex: 83 y.o., male  PCP: [name], MD Referring Physician: Trixie Nilda HERO, MD  Reason for Consultation: Establishing goals of care  HPI/Patient Profile: 83 y.o. male  with past medical history of metastatic small cell lung cancer status post SBRT and currently on immunotherapy, chronic thrombocytopenia, hypertension, hyperlipidemia, gout, GERD. Recent hospital admission 7/7-7/16 for SBO, GI bleed from cecal colitis/diverticulosis, ESBL UTI, and AKI admitted on 01/22/2024 with acute onset of shortness of breath/respiratory distress.  He was found to have findings concerning for pneumonia and fluid overload, was placed on antibiotics and diuretics and admitted to the hospital.  Patient sees outpatient palliative care at the cancer center.  Past Medical History:  Diagnosis Date   Age-related cataract of left eye    Amblyopia, right eye    Arthritis    Cancer (HCC)    Dysuria    ED (erectile dysfunction)    Elevated PSA    Epidermal cyst    Family history of prostate cancer    Gout    Hip pain    Histoplasmosis    Hyperlipidemia    Hypermetropia, left eye    Hypertension    Impaired glucose tolerance    Joint pain    Left shoulder pain    Lower back pain    Melanocytic nevi, unspecified    Nummular dermatitis    Ocular hypertension    Palmar fascial fibromatosis    Prostate cancer screening    Regular astigmatism, right eye    Senile nuclear sclerosis     Subjective:   I have reviewed medical records including EPIC notes, labs and imaging, received update from attending physician, assessed the patient and then met with the patient and his wife Barnie to discuss diagnosis prognosis, GOC, EOL wishes, disposition and options.  I introduced Palliative Medicine as  specialized medical care for people living with serious illness. It focuses on providing relief from symptoms and stress of a serious illness. The goal is to improve quality of life for both the patient and the family.  Today's Discussion: Patient sitting upright in bed.  He looks comfortable and reports no pain.  His wife Barnie is at bedside.  They just finished talking to the hospital at home team.  The patient shared his understanding of his chronic illness including his recurrent non-small cell lung cancer and his acute hospitalization for shortness of breath.  Patient shares the plan to go home with hospital at home where he can continue to receive antibiotics.   Prior to this admission the patient was living with his wife. He was able to complete all ADLs and IADLs independently. He has his pain managed by outpatient palliative care. 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   The patient has been receiving services from outpatient palliative care provider at the cancer center.  In addition to helping him with pain management, they recently discussed goals of care.  The patient shared he would want his wife Hurley Sobel to be his decision-maker if you are unable to make decisions.  They have completed HCPOA documents but keep forgetting to bring  them.  Encouraged them to bring these documents the next time they follow-up at the cancer center.  Discussed CODE STATUS.  Patient would not want an attempted resuscitation or intubation.  Changed to DNR/DNI.  At this time the patient continues to want to treat the treatable. He wants to discuss the diarrhea he had after his last immunotherapy administration and looks forward to meeting with his oncologist soon to discuss his treatment plan moving forward.   Discussed the importance of continued conversation with family and the medical providers regarding overall plan of care and treatment options,  ensuring decisions are within the context of the patient's values and GOCs.  Questions and concerns were addressed. Hard Choices booklet left for review. The family was encouraged to call with questions or concerns. PMT will continue to support holistically.  Review of Systems  Respiratory:  Positive for shortness of breath.     Objective:   Primary Diagnoses: Present on Admission:  Acute hypoxemic respiratory failure [facility])   Physical Exam Vitals reviewed.  Constitutional:      General: He is not in acute distress. HENT:     Head: Normocephalic and atraumatic.  Cardiovascular:     Rate and Rhythm: Normal rate.  Pulmonary:     Effort: Pulmonary effort is normal.  Skin:    General: Skin is warm and dry.  Neurological:     Mental Status: He is alert and oriented to person, place, and time.  Psychiatric:        Mood and Affect: Mood normal.        Behavior: Behavior normal.        Thought Content: Thought content normal.        Judgment: Judgment normal.     Vital Signs:  BP 131/78 (BP Location: Right Arm)   Pulse 87   Temp 97.8 F (36.6 C) (Oral)   Resp 19   Ht 6' 1 (1.854 m)   Wt 87.4 kg   SpO2 97%   BMI 25.42 kg/m     Advanced Care Planning:   Existing Vynca/ACP Documentation: Goals of Care document completed 12/04/2023  Primary Decision Maker: PATIENT  Code Status/Advance Care Planning: DNR  Assessment & Plan:   SUMMARY OF RECOMMENDATIONS   Changed to DNR/DNI Will scan DNR into Vynca Continue to treat the treatable Hospital at home- planned for today Follow up with Outpatient Palliative provider Levon Freud and the cancer center after discharge   Discussed with: attending provider and hospital at home providers  Time Total: 60 minutes    Thank you for allowing us  to participate in the care of MCGUIRE GASPARYAN PMT will continue to support holistically.   Signed by: Stephane Palin, NP Palliative Medicine Team  Team Phone # [phone]  (Nights/Weekends)  01/24/2024, 12:09 PM

## 2024-01-24 NOTE — Consult Note (Addendum)
 WOC Nurse Consult Note:  WOC consult performed remotely utilizing chart review and imaging. Reason for Consult:pressure injury present on admission  Wound type: Unstageable pressure injury to R/L buttocks Pressure Injury POA: Yes Measurement: see nursing flow sheets Wound bed:  R buttocks: 85% covered by yellow slough, 15 % pink tissue L buttocks: 80% covered by yellow slough, 20% pink tissue Drainage (amount, consistency, odor) see nursing flow sheets Periwound: dark purple maroon discoloration surrounding both wounds suggesting a deep tissue pressure injury with possibility to evolve over the next 7-10 days. Dressing procedure/placement/frequency:  Cleanse bilateral buttocks with NS, pat dry. Apply Medihoney to wound bed, cover with dry gauze and silicone foam dressing.  Utilize a chair pressure redistribution pad.  Noted patient will discharge to hospital at home program, as such would not be a candidate for a LALM. Continue frequent turning to off load pressure.     WOC team will not follow patient at this time. Please re-consult if new needs arise.  Thank you,  Doyal Polite, RN, MSN, [facility] WOC Team

## 2024-01-24 NOTE — Progress Notes (Addendum)
 OT Cancellation Note  Patient Details Name: Chad Moreno MRN: [id number] DOB: November 29, 1940   Cancelled Treatment:    Reason Eval/Treat Not Completed: Patient declined, no reason specified (Pt polietly asked for therapist to come back another time. He was overwhelmed with recent x-ray, echo and bathroom use. OT to return for eval as able.)  Second attempt 1334: PA talking with pt on my arrival and RN arriving to get labs as PA was leaving. OT to continue efforts for evaluation when pt is available.   Lucie JONETTA Kendall 01/24/2024, 11:17 AM

## 2024-01-25 DIAGNOSIS — E876 Hypokalemia: Secondary | ICD-10-CM | POA: Diagnosis not present

## 2024-01-25 LAB — BASIC METABOLIC PANEL WITH GFR
Anion gap: 9 (ref 5–15)
BUN: 22 mg/dL (ref 8–23)
CO2: 26 mmol/L (ref 22–32)
Calcium: 7.5 mg/dL — ABNORMAL LOW (ref 8.9–10.3)
Chloride: 107 mmol/L (ref 98–111)
Creatinine, Ser: 1 mg/dL (ref 0.61–1.24)
GFR, Estimated: >60 mL/min (ref >60)
Glucose, Bld: 103 mg/dL — ABNORMAL HIGH (ref 70–99)
Potassium: 3.1 mmol/L — ABNORMAL LOW (ref 3.5–5.1)
Sodium: 142 mmol/L (ref 135–145)

## 2024-01-25 LAB — URINALYSIS, ROUTINE W REFLEX MICROSCOPIC
Bilirubin Urine: NEGATIVE
Glucose, UA: NEGATIVE mg/dL
Hgb urine dipstick: NEGATIVE
Ketones, ur: NEGATIVE mg/dL
Nitrite: NEGATIVE
Protein, ur: 30 mg/dL — AB
Specific Gravity, Urine: 1.015 (ref 1.005–1.030)
WBC, UA: >50 WBC/hpf (ref 0–5)
pH: 6 (ref 5.0–8.0)

## 2024-01-25 LAB — CULTURE, BLOOD (ROUTINE X 2): Special Requests: ADEQUATE

## 2024-01-25 LAB — MAGNESIUM: Magnesium: 1.8 mg/dL (ref 1.7–2.4)

## 2024-01-25 MED ORDER — CELECOXIB 200 MG PO CAPS
200.0000 mg | ORAL_CAPSULE | Freq: Two times a day (BID) | ORAL | Status: DC
  Filled 2024-01-25 (×2): qty 1

## 2024-01-25 MED ORDER — POTASSIUM CHLORIDE CRYS ER 20 MEQ PO TBCR
40.0000 meq | EXTENDED_RELEASE_TABLET | ORAL | Status: AC
  Administered 2024-01-25: 80 meq via ORAL
  Filled 2024-01-25: qty 2
  Filled 2024-01-25: qty 4

## 2024-01-25 MED ORDER — PANTOPRAZOLE SODIUM 40 MG PO TBEC
40.0000 mg | DELAYED_RELEASE_TABLET | Freq: Two times a day (BID) | ORAL | Status: DC
  Administered 2024-01-25 – 2024-02-01 (×13): 40 mg via ORAL
  Filled 2024-01-25 (×16): qty 1

## 2024-01-25 MED ORDER — POTASSIUM CHLORIDE CRYS ER 20 MEQ PO TBCR
40.0000 meq | EXTENDED_RELEASE_TABLET | Freq: Every day | ORAL | Status: DC
  Filled 2024-01-25 (×2): qty 2

## 2024-01-25 MED ORDER — POTASSIUM CHLORIDE CRYS ER 20 MEQ PO TBCR: 40.0000 meq | EXTENDED_RELEASE_TABLET | Freq: Once | ORAL | Status: DC

## 2024-01-25 MED ORDER — FERROUS SULFATE 325 (65 FE) MG PO TBEC
325.0000 mg | DELAYED_RELEASE_TABLET | ORAL | Status: DC
  Administered 2024-01-25: 325 mg via ORAL
  Filled 2024-01-25 (×3): qty 1

## 2024-01-25 MED ORDER — CELECOXIB 200 MG PO CAPS
200.0000 mg | ORAL_CAPSULE | Freq: Two times a day (BID) | ORAL | Status: DC
  Administered 2024-01-25 – 2024-02-01 (×14): 200 mg via ORAL
  Filled 2024-01-25 (×17): qty 1

## 2024-01-25 MED ORDER — PROBIOTIC PEARLS ADVANTAGE PO CAPS: ORAL_CAPSULE | Freq: Every morning | ORAL | Status: DC

## 2024-01-25 MED ORDER — COLLAGENASE 250 UNIT/GM EX OINT: TOPICAL_OINTMENT | Freq: Every day | CUTANEOUS | Status: DC

## 2024-01-25 MED ORDER — RISAQUAD PO CAPS
1.0000 | ORAL_CAPSULE | Freq: Every day | ORAL | Status: DC
  Administered 2024-01-25 – 2024-02-01 (×8): 1 via ORAL
  Filled 2024-01-25 (×9): qty 1

## 2024-01-25 NOTE — Plan of Care (Signed)
  Problem: Education: °Goal: Knowledge of General Education information will improve °Description: Including pain rating scale, medication(s)/side effects and non-pharmacologic comfort measures °Outcome: Progressing °  °Problem: Health Behavior/Discharge Planning: °Goal: Ability to manage health-related needs will improve °Outcome: Progressing °  °Problem: Clinical Measurements: °Goal: Ability to maintain clinical measurements within normal limits will improve °Outcome: Progressing °Goal: Will remain free from infection °Outcome: Progressing °Goal: Diagnostic test results will improve °Outcome: Progressing °Goal: Respiratory complications will improve °Outcome: Progressing °Goal: Cardiovascular complication will be avoided °Outcome: Progressing °  °Problem: Clinical Measurements: °Goal: Diagnostic test results will improve °Outcome: Progressing °  °Problem: Clinical Measurements: °Goal: Respiratory complications will improve °Outcome: Progressing °  °Problem: Clinical Measurements: °Goal: Cardiovascular complication will be avoided °Outcome: Progressing °  °

## 2024-01-25 NOTE — Progress Notes (Signed)
 Arrived to the [name] house to find the PT sitting upright in his recliner. No obvious distress noted, PT is Aox4. PT stated that during the afternoon he had been getting up to use the restroom with minor assistance and denied any dizziness. However, he does state that he is weak in the legs.

## 2024-01-25 NOTE — Progress Notes (Signed)
 Spoke with patients wife on the phone. States pt has ambulated to the bathroom by himself and did  not have any dizziness.Reminded to call nurse if any concerns. Vicky paramedic to have in person visit within the next hour.

## 2024-01-25 NOTE — Progress Notes (Addendum)
 PROGRESS NOTE    ELZA SORTOR  FMW:[id number] DOB: March 02, 1941 DOA: 01/22/2024 PCP: [name], MD  Brief Narrative: 83 y.o. male with medical history significant of metastatic small cell lung cancer status post SBRT and currently on immunotherapy, chronic thrombocytopenia, hypertension, hyperlipidemia, gout, GERD. Recent hospital admission 7/7-7/16 for SBO, GI bleed from cecal colitis/diverticulosis, ESBL UTI, and AKI. Patient presents to the ED via EMS for evaluation of acute onset shortness of breath/respiratory distress. He was found to have findings concerning for pneumonia and fluid overload, was placed on antibiotics and diuretics and admitted to the hospital. His blood cultures were positive for ESBL E. Coli, ID were consulted and recommended 7 days of antibiotics and follow-up. TTE showed newly reduced EF, cardiology was consulted and recommended medical therapy. [facility] on 8/1.   8/2 patient identified as Tushar Enns 07/20/1940  I am located at home in [facility]  Assessment & Plan:   Principal Problem:   Acute CHF (congestive heart failure) (HCC) Active Problems:   Acute hypoxemic respiratory failure (HCC)   CAP (community acquired pneumonia)   SIRS (systemic inflammatory response syndrome) (HCC)   Elevated troponin   CHF exacerbation (HCC)   Pressure injury of skin   Acute systolic CHF (congestive heart failure) (HCC)  1) ESBL E. coli bacteremia - Bcx obtained and speciated on morning of 7/31, 1/4 bottles of ESBL E. coli. Started on meropenem . Last time he was hospitalized he was found to have 10^5 CFU's ESBL E. coli in the urine culture. He reported dysuria at that time and burning with urination. He has been afebrile with normal white count. Infectious disease was consulted and they recommend 7 total days of antibiotic (end date 8/6). Abx were changed to ertapenem  in anticipation to transfer to [facility]  program. Repeat bcx obtained on 7/31 @ 6:50 PM and 6:55 PM Pending.   2) Acute systolic heart failure- Presented with hypoxia. He was placed on IV Lasix  40 mg BID, diuresed well and was net -5 L, down 2 lbs. TTE on 8/1 showed EF 30-35%, LV RWMA, mild LVH, mildly reduced RV function, normal IVC. Cardiology was consulted. Cardiomyopathy thought to be likely secondary to his immunotherapy, stress CM, or ischemia. Patient deferred any invasive ischemic evaluation but was agreeable to medical management. Patient started on Lasix  40 mg daily, Entresto  24-26 mg twice daily and spironolactone  12.5 mg daily with plan for repeat TTE in 3 months. Patient does NOT want to take Entresto  anymore. Holding aldactone  today due to orthostasis positive,already received lasix  40 mg.   3) Acute hypoxic respiratory failure, resolved- Initially thought to be due to pneumonia and fluid overload. initially required BiPAP, he was placed on IV doxycycline  and IV lasix , and weaned off to room air on 8/1, respiratory status much improved and he is back to baseline. Repeat CXR shows improvement in aeration, doxycycline  discontinued on 8/1. Check ambulatory oxygen saturation.   4 ) Metastatic small cell lung cancer- Metastatic non-small cell lung cancer, adenocarcinoma, with BRAF V600E mutation and PD-L1 expression of 1%. Diagnosed in May 2024 with malignant pleural effusion noted in December 2024. Currently on encorafenib  and binimetinib  since July 22, 2023. He reports recent diarrhea from current immunotherapy.Would like to discuss with his Oncologist, Dr. [name], about stopping or starting an alternative treatment. Has follow up w/ oncology on Tuesday, 8/5 that he DOES NOT want to miss. Discussed with Dr. Jeannett team and they have changed his appt  to a virtual visit on Tuesday at 10:00 am. Also needs the follow labs 8/4 prior to visit CBC w/ diff, CMP, iron  and TIBC, ferritin, folate and vitamin B12 ordered.   5) Anemia of  chronic disease - Anemia likely from malignancy. Hgb stable at 10.3. follow labs 8/4. Restart iron    6) Troponin elevation- Troponin 27->124. No chest pain or ischemic changes on EKG. Secondary to demand ischemia.    7) Hypokalemia- Potassium 3.1 still  replete with KCl 40 mEq x 2 and standing dose of K.   8) Unstageable pressure injury to R/L buttocks, POA-  wound care recommends Cleanse bilateral buttocks with NS, pat dry. Apply Medihoney to wound bed, cover with dry gauze and silicone foam dressing. Utilize a chair pressure redistribution pad.  9)Misc restart iron  protonix  probiotics celebrex  bid   Estimated body mass index is 24.01 kg/m as calculated from the following:   Height as of this encounter: 6' 1 (1.854 m).   Weight as of this encounter: 82.6 kg.  DVT prophylaxis:lovenox  Code Status:dnr Family Communication: wife at bedside Disposition Plan:  Status is: Inpatient Remains inpatient appropriate because: acute illness   Consultants:  ID CARDS WOUND CARE  Procedures: MIDLINE Antimicrobials: INVANZ   Subjective: Seen sitting up in chair wife at bed side Orthostatic this am C/o dysuria and urine concentrated  Holding aldactone  He does not want to take entresto  anymore K 3.1 mg 1.8  Objective: Vitals:   01/25/24 0212 01/25/24 0330 01/25/24 0429 01/25/24 0542  BP:      Pulse: 84 73  71  Resp: 17 14  13   Temp:      TempSrc:      SpO2: 94% 94% 93% 96%  Weight:      Height:        Intake/Output Summary (Last 24 hours) at 01/25/2024 0719 Last data filed at 01/24/2024 2028 Gross per 24 hour  Intake 10 ml  Output 325 ml  Net -315 ml   Filed Weights   01/23/24 1117 01/24/24 0454 01/24/24 1800  Weight: 88.4 kg 87.4 kg 82.6 kg    Examination: patient seen with [place] Paramedic at bed side Exam by Turkey Huffman 135/81 0n RA pulse 70 General exam: Appears in nad Respiratory system:. Respiratory effort normal. Cardiovascular system: no  tachycardia  No pedal edema. Gastrointestinal system:soft. Central nervous system: Alert and oriented.  Extremities: no edema   Data Reviewed: I have personally reviewed following labs and imaging studies  CBC: Recent Labs  Lab 01/20/24 1003 01/22/24 2125 01/23/24 0346 01/24/24 0218  WBC 5.3 7.8 7.9 7.9  NEUTROABS  --  5.5  --   --   HGB 9.9* 10.5* 10.3* 9.9*  HCT 30.3* 33.2* 32.3* 30.9*  MCV 87.8 89.7 89.0 88.8  PLT 106* 165 140* 163   Basic Metabolic Panel: Recent Labs  Lab 01/22/24 2125 01/23/24 0346 01/24/24 0218  NA 141 141 145  K 3.7 3.1* 3.0*  CL 107 104 105  CO2 22 24 29   GLUCOSE 129* 211* 117*  BUN 15 12 14   CREATININE 0.81 0.89 0.87  CALCIUM 8.4* 8.5* 8.3*  MG  --   --  1.9   GFR: Estimated Creatinine Clearance: 74 mL/min (by C-G formula based on SCr of 0.87 mg/dL). Liver Function Tests: Recent Labs  Lab 01/22/24 2125 01/24/24 0218  AST 39 20  ALT 30 23  ALKPHOS 60 56  BILITOT 1.0 0.9  PROT 6.1* 5.7*  ALBUMIN 2.9* 2.7*   No results  for input(s): LIPASE, AMYLASE in the last 168 hours. No results for input(s): AMMONIA in the last 168 hours. Coagulation Profile: Recent Labs  Lab 01/20/24 1003  INR 1.1   Cardiac Enzymes: No results for input(s): CKTOTAL, CKMB, CKMBINDEX, TROPONINI in the last 168 hours. BNP (last 3 results) No results for input(s): PROBNP in the last 8760 hours. HbA1C: No results for input(s): HGBA1C in the last 72 hours. CBG: No results for input(s): GLUCAP in the last 168 hours. Lipid Profile: No results for input(s): CHOL, HDL, LDLCALC, TRIG, CHOLHDL, LDLDIRECT in the last 72 hours. Thyroid  Function Tests: No results for input(s): TSH, T4TOTAL, FREET4, T3FREE, THYROIDAB in the last 72 hours. Anemia Panel: No results for input(s): VITAMINB12, FOLATE, FERRITIN, TIBC, IRON , RETICCTPCT in the last 72 hours. Sepsis Labs: Recent Labs  Lab 01/23/24 0346 01/23/24 0554   PROCALCITON 0.47  --   LATICACIDVEN 2.7* 2.0*    Recent Results (from the past 240 hours)  Resp panel by RT-PCR (RSV, Flu A&B, Covid) Anterior Nasal Swab     Status: None   Collection Time: 01/22/24  9:26 PM   Specimen: Anterior Nasal Swab  Result Value Ref Range Status   SARS Coronavirus 2 by RT PCR NEGATIVE NEGATIVE Final   Influenza A by PCR NEGATIVE NEGATIVE Final   Influenza B by PCR NEGATIVE NEGATIVE Final    Comment: (NOTE) The Xpert Xpress SARS-CoV-2/FLU/RSV plus assay is intended as an aid in the diagnosis of influenza from Nasopharyngeal swab specimens and should not be used as a sole basis for treatment. Nasal washings and aspirates are unacceptable for Xpert Xpress SARS-CoV-2/FLU/RSV testing.  Fact Sheet for Patients: BloggerCourse.com  Fact Sheet for Healthcare Providers: SeriousBroker.it  This test is not yet approved or cleared by the [place]  FDA and has been authorized for detection and/or diagnosis of SARS-CoV-2 by FDA under an Emergency Use Authorization (EUA). This EUA will remain in effect (meaning this test can be used) for the duration of the COVID-19 declaration under Section 564(b)(1) of the Act, 21 U.S.C. section 360bbb-3(b)(1), unless the authorization is terminated or revoked.     Resp Syncytial Virus by PCR NEGATIVE NEGATIVE Final    Comment: (NOTE) Fact Sheet for Patients: BloggerCourse.com  Fact Sheet for Healthcare Providers: SeriousBroker.it  This test is not yet approved or cleared by the [place]  FDA and has been authorized for detection and/or diagnosis of SARS-CoV-2 by FDA under an Emergency Use Authorization (EUA). This EUA will remain in effect (meaning this test can be used) for the duration of the COVID-19 declaration under Section 564(b)(1) of the Act, 21 U.S.C. section 360bbb-3(b)(1), unless the authorization is  terminated or revoked.  Performed at [facility], [address], [place], [place] [address]   MRSA Next Gen by PCR, Nasal     Status: None   Collection Time: 01/23/24  3:00 AM   Specimen: Nasal Mucosa; Nasal Swab  Result Value Ref Range Status   MRSA by PCR Next Gen NOT DETECTED NOT DETECTED Final    Comment: (NOTE) The GeneXpert MRSA Assay (FDA approved for NASAL specimens only), is one component of a comprehensive MRSA colonization surveillance program. It is not intended to diagnose MRSA infection nor to guide or monitor treatment for MRSA infections. Test performance is not FDA approved in patients less than 35 years old. Performed at [facility], [address] [place], [place] [address]   Culture, blood (Routine X 2) w Reflex to ID Panel  Status: Abnormal (Preliminary result)   Collection Time: 01/23/24  3:46 AM   Specimen: BLOOD RIGHT FOREARM  Result Value Ref Range Status   Specimen Description BLOOD RIGHT FOREARM  Final   Special Requests   Final    BOTTLES DRAWN AEROBIC AND ANAEROBIC Blood Culture adequate volume   Culture  Setup Time   Final    GRAM NEGATIVE RODS AEROBIC BOTTLE ONLY CRITICAL RESULT CALLED TO, READ BACK BY AND VERIFIED WITH: PHARMD AMDREW GRETEL [phone] AT 1748 BY EC    Culture (A)  Final    ESCHERICHIA COLI SUSCEPTIBILITIES TO FOLLOW Performed at [facility], [address], [place], [place] [address]    Report Status PENDING  Incomplete  Culture, blood (Routine X 2) w Reflex to ID Panel     Status: None (Preliminary result)   Collection Time: 01/23/24  3:46 AM   Specimen: BLOOD LEFT HAND  Result Value Ref Range Status   Specimen Description BLOOD LEFT HAND  Final   Special Requests   Final    BOTTLES DRAWN AEROBIC AND ANAEROBIC Blood Culture adequate volume   Culture   Final    NO GROWTH 1 DAY Performed at [facility], [address], [place], [place] [address]    Report Status PENDING  Incomplete  Blood  Culture ID Panel (Reflexed)     Status: Abnormal   Collection Time: 01/23/24  3:46 AM  Result Value Ref Range Status   Enterococcus faecalis NOT DETECTED NOT DETECTED Final   Enterococcus Faecium NOT DETECTED NOT DETECTED Final   Listeria monocytogenes NOT DETECTED NOT DETECTED Final   Staphylococcus species NOT DETECTED NOT DETECTED Final   Staphylococcus aureus (BCID) NOT DETECTED NOT DETECTED Final   Staphylococcus epidermidis NOT DETECTED NOT DETECTED Final   Staphylococcus lugdunensis NOT DETECTED NOT DETECTED Final   Streptococcus species NOT DETECTED NOT DETECTED Final   Streptococcus agalactiae NOT DETECTED NOT DETECTED Final   Streptococcus pneumoniae NOT DETECTED NOT DETECTED Final   Streptococcus pyogenes NOT DETECTED NOT DETECTED Final   A.calcoaceticus-baumannii NOT DETECTED NOT DETECTED Final   Bacteroides fragilis NOT DETECTED NOT DETECTED Final   Enterobacterales DETECTED (A) NOT DETECTED Final    Comment: Enterobacterales represent a large order of gram negative bacteria, not a single organism. CRITICAL RESULT CALLED TO, READ BACK BY AND VERIFIED WITH: PHARMD ANDREW MEYER [id number] AT 1748 BY EC    Enterobacter cloacae complex NOT DETECTED NOT DETECTED Final   Escherichia coli DETECTED (A) NOT DETECTED Final    Comment: CRITICAL RESULT CALLED TO, READ BACK BY AND VERIFIED WITH: PHARMD PRENTICE GRETEL [phone] AT 1748 BY EC    Klebsiella aerogenes NOT DETECTED NOT DETECTED Final   Klebsiella oxytoca NOT DETECTED NOT DETECTED Final   Klebsiella pneumoniae NOT DETECTED NOT DETECTED Final   Proteus species NOT DETECTED NOT DETECTED Final   Salmonella species NOT DETECTED NOT DETECTED Final   Serratia marcescens NOT DETECTED NOT DETECTED Final   Haemophilus influenzae NOT DETECTED NOT DETECTED Final   Neisseria meningitidis NOT DETECTED NOT DETECTED Final   Pseudomonas aeruginosa NOT DETECTED NOT DETECTED Final   Stenotrophomonas maltophilia NOT DETECTED NOT DETECTED Final    Candida albicans NOT DETECTED NOT DETECTED Final   Candida auris NOT DETECTED NOT DETECTED Final   Candida glabrata NOT DETECTED NOT DETECTED Final   Candida krusei NOT DETECTED NOT DETECTED Final   Candida parapsilosis NOT DETECTED NOT DETECTED Final   Candida tropicalis NOT DETECTED NOT DETECTED Final  Cryptococcus neoformans/gattii NOT DETECTED NOT DETECTED Final   CTX-M ESBL DETECTED (A) NOT DETECTED Final    Comment: CRITICAL RESULT CALLED TO, READ BACK BY AND VERIFIED WITH: MAYA PRENTICE POISSON [id number] AT 1748 BY EC (NOTE) Extended spectrum beta-lactamase detected. Recommend a carbapenem as initial therapy.      Carbapenem resistance IMP NOT DETECTED NOT DETECTED Final   Carbapenem resistance KPC NOT DETECTED NOT DETECTED Final   Carbapenem resistance NDM NOT DETECTED NOT DETECTED Final   Carbapenem resist OXA 48 LIKE NOT DETECTED NOT DETECTED Final   Carbapenem resistance VIM NOT DETECTED NOT DETECTED Final    Comment: Performed at [facility], [address], [place], [place] [address]  Respiratory (~20 pathogens) panel by PCR     Status: None   Collection Time: 01/23/24  4:15 AM   Specimen: Nasopharyngeal Swab; Respiratory  Result Value Ref Range Status   Adenovirus NOT DETECTED NOT DETECTED Final   Coronavirus 229E NOT DETECTED NOT DETECTED Final    Comment: (NOTE) The Coronavirus on the Respiratory Panel, DOES NOT test for the novel  Coronavirus (2019 nCoV)    Coronavirus HKU1 NOT DETECTED NOT DETECTED Final   Coronavirus NL63 NOT DETECTED NOT DETECTED Final   Coronavirus OC43 NOT DETECTED NOT DETECTED Final   Metapneumovirus NOT DETECTED NOT DETECTED Final   Rhinovirus / Enterovirus NOT DETECTED NOT DETECTED Final   Influenza A NOT DETECTED NOT DETECTED Final   Influenza B NOT DETECTED NOT DETECTED Final   Parainfluenza Virus 1 NOT DETECTED NOT DETECTED Final   Parainfluenza Virus 2 NOT DETECTED NOT DETECTED Final   Parainfluenza Virus 3 NOT DETECTED  NOT DETECTED Final   Parainfluenza Virus 4 NOT DETECTED NOT DETECTED Final   Respiratory Syncytial Virus NOT DETECTED NOT DETECTED Final   Bordetella pertussis NOT DETECTED NOT DETECTED Final   Bordetella Parapertussis NOT DETECTED NOT DETECTED Final   Chlamydophila pneumoniae NOT DETECTED NOT DETECTED Final   Mycoplasma pneumoniae NOT DETECTED NOT DETECTED Final    Comment: Performed at [facility], [address] [place], [place] [address]  Culture, blood (Routine X 2) w Reflex to ID Panel     Status: None (Preliminary result)   Collection Time: 01/23/24  6:50 PM   Specimen: BLOOD RIGHT HAND  Result Value Ref Range Status   Specimen Description BLOOD RIGHT HAND  Final   Special Requests   Final    BOTTLES DRAWN AEROBIC ONLY Blood Culture results may not be optimal due to an inadequate volume of blood received in culture bottles   Culture   Final    NO GROWTH < 12 HOURS Performed at [facility], [address] [place], [place] [address]    Report Status PENDING  Incomplete  Culture, blood (Routine X 2) w Reflex to ID Panel     Status: None (Preliminary result)   Collection Time: 01/23/24  6:55 PM   Specimen: BLOOD LEFT HAND  Result Value Ref Range Status   Specimen Description BLOOD LEFT HAND  Final   Special Requests   Final    BOTTLES DRAWN AEROBIC ONLY Blood Culture results may not be optimal due to an inadequate volume of blood received in culture bottles   Culture   Final    NO GROWTH < 12 HOURS Performed at [facility], [address], [place], [place] [address]    Report Status PENDING  Incomplete         Radiology Studies:  US  EKG SITE RITE Result Date: 01/24/2024 If Site Rite image not attached, placement could not be confirmed due to current cardiac rhythm.  ECHOCARDIOGRAM COMPLETE Result Date: 01/24/2024    ECHOCARDIOGRAM REPORT   Patient Name:   ZARIAH JOST Date of Exam: 01/24/2024 Medical Rec #:  [id number]       Height:       73.0 in  Accession #:    [id number]      Weight:       192.7 lb Date of Birth:  Jul 15, 1940       BSA:          2.118 m Patient Age:    82 years        BP:           131/78 mmHg Patient Gender: M               HR:           74 bpm. Exam Location:  Inpatient Procedure: 2D Echo, Cardiac Doppler, Color Doppler and Intracardiac            Opacification Agent (Both Spectral and Color Flow Doppler were            utilized during procedure). Indications:    CHF  History:        Patient has prior history of Echocardiogram examinations, most                 recent 07/19/2023. Risk Factors:Hypertension.  Sonographer:    Philomena Daring Referring Phys: [id number] VASUNDHRA RATHORE IMPRESSIONS  1. Left ventricular ejection fraction, by estimation, is 30 to 35%. The left ventricle has moderately decreased function. The left ventricle demonstrates regional wall motion abnormalities (see scoring diagram/findings for description). There is mild left ventricular hypertrophy. Left ventricular diastolic parameters are indeterminate.  2. Right ventricular systolic function is mildly reduced. The right ventricular size is normal. Normal RV basal function with apical hypokinesis  3. Moderate pleural effusion in the left lateral region.  4. The mitral valve was not well visualized. Trivial mitral valve regurgitation.  5. The aortic valve was not well visualized. Aortic valve regurgitation is not visualized. No aortic stenosis is present.  6. The inferior vena cava is normal in size with greater than 50% respiratory variability, suggesting right atrial pressure of 3 mmHg. Conclusion(s)/Recommendation(s): Normal LV basal function with apical akinesis. Could represent Takotsubo cardiomyopathy vs LAD territory infarct. FINDINGS  Left Ventricle: Left ventricular ejection fraction, by estimation, is 30 to 35%. The left ventricle has moderately decreased function. The left ventricle demonstrates regional wall motion abnormalities. Definity  contrast agent was  given IV to delineate the left ventricular endocardial borders. The left ventricular internal cavity size was normal in size. There is mild left ventricular hypertrophy. Left ventricular diastolic parameters are indeterminate.  LV Wall Scoring: The entire apex is akinetic. The mid anteroseptal segment, mid inferolateral segment, mid anterolateral segment, mid inferoseptal segment, mid anterior segment, and mid inferior segment are hypokinetic. The basal anteroseptal segment, basal inferolateral segment, basal anterolateral segment, basal anterior segment, basal inferior segment, and basal inferoseptal segment are normal. Right Ventricle: The right ventricular size is normal. No increase in right ventricular wall thickness. Right ventricular systolic function is mildly reduced. Left Atrium: Left atrial size was normal in size. Right Atrium: Right atrial size was normal in size. Pericardium: There is no evidence of pericardial effusion. Mitral Valve: The mitral valve was not well visualized. Trivial mitral valve regurgitation. Tricuspid Valve: The  tricuspid valve is normal in structure. Tricuspid valve regurgitation is trivial. Aortic Valve: The aortic valve was not well visualized. Aortic valve regurgitation is not visualized. No aortic stenosis is present. Pulmonic Valve: The pulmonic valve was not well visualized. Pulmonic valve regurgitation is not visualized. Aorta: The aortic root is normal in size and structure. Venous: The inferior vena cava is normal in size with greater than 50% respiratory variability, suggesting right atrial pressure of 3 mmHg. IAS/Shunts: The interatrial septum was not well visualized. Additional Comments: There is a moderate pleural effusion in the left lateral region.  LEFT VENTRICLE PLAX 2D LVIDd:         4.90 cm LVIDs:         3.90 cm LV PW:         1.00 cm LV IVS:        1.10 cm LVOT diam:     2.00 cm LV SV:         45 LV SV Index:   21 LVOT Area:     3.14 cm  LV Volumes (MOD) LV  vol d, MOD A2C: 84.1 ml LV vol d, MOD A4C: 147.0 ml LV vol s, MOD A2C: 43.5 ml LV vol s, MOD A4C: 73.9 ml LV SV MOD A2C:     40.6 ml LV SV MOD A4C:     147.0 ml LV SV MOD BP:      59.8 ml RIGHT VENTRICLE             IVC RV S prime:     10.90 cm/s  IVC diam: 1.30 cm TAPSE (M-mode): 2.1 cm LEFT ATRIUM           Index        RIGHT ATRIUM           Index LA diam:      3.20 cm 1.51 cm/m   RA Area:     13.50 cm LA Vol (A4C): 35.5 ml 16.76 ml/m  RA Volume:   28.00 ml  13.22 ml/m  AORTIC VALVE LVOT Vmax:   85.40 cm/s LVOT Vmean:  54.000 cm/s LVOT VTI:    0.144 m  AORTA Ao Root diam: 3.10 cm  SHUNTS Systemic VTI:  0.14 m Systemic Diam: 2.00 cm Lonni Nanas MD Electronically signed by Lonni Nanas MD Signature Date/Time: 01/24/2024/12:28:37 PM    Final    DG Chest 2 View Result Date: 01/24/2024 CLINICAL DATA:  [id number] CAP (community acquired pneumonia) [id number] EXAM: CHEST - 2 VIEW COMPARISON:  January 22, 2024 FINDINGS: Similar appearance of the left mid lung nodular opacity and masslike consolidation of the right perihilar region with fiducial markers present. Significantly improved aeration of the lungs. Small layering bilateral pleural effusions with persistent left basilar consolidation. No pneumothorax. Mild cardiomegaly. Tortuous aorta with aortic atherosclerosis. No acute fracture or destructive lesions. Multilevel thoracic osteophytosis. IMPRESSION: Significantly improved aeration of the lungs with decreasing pleural effusions, small on the left and trace on the right. Persistent retrocardiac airspace consolidation again noted. Electronically Signed   By: Rogelia Myers M.D.   On: 01/24/2024 12:06     Scheduled Meds:  celecoxib   200 mg Oral Daily   enoxaparin  (LOVENOX ) injection  40 mg Subcutaneous Q24H   feeding supplement (GLUCERNA SHAKE)  237 mL Oral TID BM   furosemide   40 mg Oral Daily   Gerhardt's butt cream   Topical BID   leptospermum manuka honey  1 Application Topical Daily    sacubitril -valsartan   1 tablet  Oral BID   sodium chloride  flush  10-40 mL Intracatheter Q12H   spironolactone   12.5 mg Oral Daily   Continuous Infusions:  ertapenem  1 g (01/24/24 1311)     LOS: 2 days     Almarie KANDICE Hoots, MD 01/25/2024, 7:19 AM

## 2024-01-25 NOTE — Consult Note (Addendum)
 WOC Nurse Consult Note: Received a calling for the nurse in chart regarding this case.  Reason for Consult: requested to reassess a Bilateral buttock wound. Performed remotely evaluating photos and notes. Wound type: PI stage 3 on buttocks. Pressure Injury POA: Yes. Measurement: aprox. 3 cm x 2.5 cm x 0.1 cm on the left. 3.2 cm x 1.5 cm x 0.1 cm on the right. Wound bed: 100% yellow slough Drainage (amount, consistency, odor) Minimum amount, no odor. Periwound: intact, slightly purple skin. Dressing procedure/placement/frequency: Cleanse with Vashe #[id number], pat dry the peri-wound skin, not rinse. Apply Santyl  daily on the wound bed. Cover with foam dressing, change every 3 days or PRN.  WOC team will not plan to follow further. Please reconsult if further assistance is needed. Thank-you,  Lela Holm BSN, CNS, RN, [employer], WOCN  (Phone [phone])

## 2024-01-25 NOTE — Progress Notes (Signed)
 Video rounding completed with paramedic and MD. Pt in good spirits. Some concern with dark urine and burning with urination,orders received.

## 2024-01-25 NOTE — Progress Notes (Signed)
 Paramedic Treavon Castilleja arrived on scene to find the PT sitting upright in his recliner, no obvious distress noted. PT is Aox4. PT stated that he is feeling better this morning, when he walked around he denied any dizziness or weakness. PT stated that he has been able to keep water down and had a loose BM yesterday. PT was told of care plan from this morning's IDR with EDD of 01/29/2024 due to the 7 days of IV antibiotics. PT had questions about his medication 'Entresto ' and it dropping his BP. He requested that his PCP be made aware that he is not wanting to take it anymore.   [name] vitals were obtained and documented appropriately. PICC line was flushed and patent. IV antibiotics were initiated and completed during visit. PT denied any SOB or difficulty breathing. He denied being in any pain. Oral medications were administered; 'Spironolactone ' was held off per MD Alvia due to concern of BP and will be administered this afternoon. 'Entresto ' was refused by the PT and MD Alvia was made aware of it on the visit. Orthostatic vitals were obtained and found to be positive with a HR increasing from 70s to 101 while standing and walking. PT denied any SOB or dizziness while walking. He did state that he feels generally weak but denied any other complaints. IV blood draw was performed and urine sample was obtained.   PT denied any other complaints and requested that wound care to sacrum be done at the afternoon visit. PT was reminded that if he has any complaints or questions to call the virtual RN on the tablet. Paramedic Hyacinth will be back this afternoon to administer other medications and sacral wound care.

## 2024-01-25 NOTE — Plan of Care (Signed)
  Problem: Education: Goal: Knowledge of General Education information will improve Description: Including pain rating scale, medication(s)/side effects and non-pharmacologic comfort measures Outcome: Progressing   Problem: Clinical Measurements: Goal: Respiratory complications will improve Outcome: Progressing   Problem: Clinical Measurements: Goal: Cardiovascular complication will be avoided Outcome: Progressing   Problem: Activity: Goal: Risk for activity intolerance will decrease Outcome: Progressing   Problem: Nutrition: Goal: Adequate nutrition will be maintained Outcome: Progressing   Problem: Elimination: Goal: Will not experience complications related to bowel motility Outcome: Progressing Goal: Will not experience complications related to urinary retention Outcome: Progressing   Problem: Safety: Goal: Ability to remain free from injury will improve Outcome: Progressing   Problem: Skin Integrity: Goal: Risk for impaired skin integrity will decrease Outcome: Progressing   Problem: Respiratory: Goal: Ability to maintain adequate ventilation will improve Outcome: Progressing Goal: Ability to maintain a clear airway will improve Outcome: Progressing

## 2024-01-25 NOTE — Progress Notes (Signed)
 82- RN introduction, Patient identifiers completed, plan for overnight monitoring, and [facility] contact information reviewed. Patient only has feeding supplement due and preferred and agreed to a video call for medication closer to 2000.   1952- Patient /caregiver contact via Video call. Patient identifiers review  patient sitting up in chair with spouse at side.   Patient A&O x4. Patient denies pain, confirmed dizziness has resolved. Education on safety, dizziness, medications, S/Sx hypotension and respiratory distress. Overnight monitoring plan, [facility] contact information, when to call RN vs 911 for emergency needs, /sx of change in condition, and plan for AM medications reviewed. Per Patient and Spouse, no other questions or concerns at the time of video call. Patient will continue to be monitored.

## 2024-01-25 NOTE — Progress Notes (Signed)
 Spoke with lab regarding labs ordered and all labs are in process.

## 2024-01-25 NOTE — Progress Notes (Addendum)
 2253- Current Health Data Alert: Patient reported not feeling well: Video call to patient and spouse. No answer     2254: Inbound call received from spouse who reported patient was patient feeling really dizzy again and afraid to get up from the bathroom toilet. Need for emergency assistance assessed, spouse confirmed will not call 911 and would get assistance from neighbor to help. Patient is noted as DNR limited. APP notified  via secure chat and Amion contact number while on the phone with spouse. Spouse confirmed unable to take patient's BP. The spouse denied any other adverse symptoms SOB, or chest pain. Spouse ended call to contact neighbor.   APP request dispatch of EMT for follow up on ambulation assistance and vitals. EMT notified.    [id number]: Return call to patient/spouse. Spouse confirmed patient was out of the bathroom and resting on the couch. Spouse reported patient had some dizziness a month ago with immunotherapy. Patient and spouse informed provider for tonight aware of change in condition. No other changes in condition reported. Denied need for emergency services or need to speak with covering provider. Spouse notified of EMT ETA.      2335- Patient /Spouse contact via Video call: Patient alert, resting on the couch EMT and spouse at side. Patient reported "I feel much better than I did 30 minutes ago."  Patient feels better lying down. Plan for safety and use of urinal and BSC completed. Patient and Spouse agreed again to use urinal or BSC for the night. Plan for change in condition and/or worsening Sx reviewed.    2347-APP notified patient and spouse's agreement to plan for tonight, patient's current status, and request to discuss medications on AM provider call.

## 2024-01-25 NOTE — Progress Notes (Addendum)
 9758- Contact call RE: Hypoxia for SpO2 alert per Current Health data- per spouse, patient is sleeping at the time of alert. Patient repositioning, laying on side of armband may have shifted placement. Placement reassessed. Spouse confirmed, as of now dizziness has resolved since last contact call. [facility] contact information and when to call for emergency assistance reviewed. SPO2 showing at 98% per classic view prior to ending call.   0322- Contact call RE: Hypoxia for SpO2 alert per Current Health data- per spouse and patient, doing well. [facility] contact information and when to call for emergency assistance reviewed. SPO2 showing at 94% per classic view prior to ending call. Will continue to monitor.     9495- Contact call RE: Hypoxia for SpO2 alert per Current Health data-per Spouse, patient asleep no signs of distress or discomfort. Will continue to monitor.

## 2024-01-25 NOTE — TOC Initial Note (Signed)
 Transition of Care [facility]) - Initial/Assessment Note    Patient Details  Name: Chad Moreno MRN: [id number] Date of Birth: 06-14-1941  [facility] CM/SW Contact:    Debarah Saunas, RN Phone Number: 01/25/2024, 8:49 AM  Clinical Narrative:                 83 yo male admitted INPATIENT for ESBL E.coli bacteremia and Acute systolic heart failure.  Plan to discharge home with home health.  Inpatient Care Management (ICM) will continue to follow.  Expected Discharge Plan: Home w Home Health Services Barriers to Discharge: Continued Medical Work up   Patient Goals and CMS Choice Patient states their goals for this hospitalization and ongoing recovery are:: to return home and get better, get back to himself          Expected Discharge Plan and Services   Discharge Planning Services: CM Consult   Living arrangements for the past 2 months: Single Family Home                                      Prior Living Arrangements/Services Living arrangements for the past 2 months: Single Family Home Lives with:: Spouse Patient language and need for interpreter reviewed:: Yes        Need for Family Participation in Patient Care: Yes (Comment) Care giver support system in place?: Yes (comment) Current home services: DME (rolling walker) Criminal Activity/Legal Involvement Pertinent to Current Situation/Hospitalization: No - Comment as needed  Activities of Daily Living   ADL Screening (condition at time of admission) Independently performs ADLs?: Yes (appropriate for developmental age) Is the patient deaf or have difficulty hearing?: Yes Does the patient have difficulty seeing, even when wearing glasses/contacts?: No Does the patient have difficulty concentrating, remembering, or making decisions?: No  Permission Sought/Granted   Permission granted to share information with : Yes, Verbal Permission Granted        Permission granted to share info w Relationship:  spouse     Emotional Assessment Appearance:: Other (Comment Required (Hospital at Home, telephone visit) Attitude/Demeanor/Rapport: Engaged, Ambitious Affect (typically observed): Pleasant Orientation: : Oriented to Self, Oriented to Place, Oriented to  Time, Oriented to Situation Alcohol  / Substance Use: Not Applicable Psych Involvement: No (comment)  Admission diagnosis:  Acute hypoxemic respiratory failure (HCC) [J96.01] CHF exacerbation (HCC) [I50.9] Patient Active Problem List   Diagnosis Date Noted   Pressure injury of skin 01/24/2024   Acute systolic CHF (congestive heart failure) (HCC) 01/24/2024   Acute hypoxemic respiratory failure (HCC) 01/23/2024   CAP (community acquired pneumonia) 01/23/2024   Acute CHF (congestive heart failure) (HCC) 01/23/2024   SIRS (systemic inflammatory response syndrome) (HCC) 01/23/2024   Elevated troponin 01/23/2024   CHF exacerbation (HCC) 01/23/2024   Iron  deficiency anemia, unspecified 01/06/2024   SBO (small bowel obstruction) (HCC) 12/30/2023   Metastasis to bone (HCC) 09/19/2023   Pleural effusion 06/13/2023   Non-small cell cancer of left lung (HCC) 09/05/2022   Pulmonary nodules 08/23/2022   Malignant neoplasm of prostate (HCC) 07/31/2022   PCP:  [name], MD Pharmacy:   [facility] [id number] - [place], [place] - [address] [place] [address] Phone: [phone] Fax: [phone]  [facility] - [facility] [address] [place] [address] Phone: [phone] Fax: [phone]     Social Drivers of Health (SDOH) Social History:  SDOH Screenings   Food Insecurity: No Food Insecurity (01/23/2024)  Housing: Low Risk  (01/24/2024)  Transportation Needs: No Transportation Needs (01/23/2024)  Utilities: Not At Risk (01/23/2024)  Depression (PHQ2-9): Low Risk  (07/31/2022)  Social Connections: Socially Integrated (01/23/2024)  Tobacco Use: Medium Risk  (01/22/2024)   SDOH Interventions:     Readmission Risk Interventions    01/25/2024    8:49 AM 12/31/2023    1:01 PM  Readmission Risk Prevention Plan  Transportation Screening Complete Complete  PCP or Specialist Appt within 5-7 Days Not Complete Complete  Not Complete comments will obtain closer to discharge date   Home Care Screening Complete Complete  Medication Review (RN CM) Complete Complete

## 2024-01-25 NOTE — Plan of Care (Signed)
  Problem: Education: Goal: Knowledge of General Education information will improve Description: Including pain rating scale, medication(s)/side effects and non-pharmacologic comfort measures Outcome: Progressing   Problem: Clinical Measurements: Goal: Cardiovascular complication will be avoided Outcome: Progressing   Problem: Nutrition: Goal: Adequate nutrition will be maintained Outcome: Progressing   Problem: Pain Managment: Goal: General experience of comfort will improve and/or be controlled Outcome: Progressing   Problem: Safety: Goal: Ability to remain free from injury will improve Outcome: Progressing

## 2024-01-26 DIAGNOSIS — E876 Hypokalemia: Secondary | ICD-10-CM | POA: Diagnosis not present

## 2024-01-26 MED ORDER — SPIRONOLACTONE 12.5 MG HALF TABLET
12.5000 mg | ORAL_TABLET | Freq: Every day | ORAL | Status: DC
  Filled 2024-01-26 (×2): qty 1

## 2024-01-26 MED ORDER — NON FORMULARY: 8.0000 mg | Freq: Every day | Status: DC

## 2024-01-26 MED ORDER — SILODOSIN 8 MG PO CAPS
8.0000 mg | ORAL_CAPSULE | Freq: Every day | ORAL | Status: DC
  Filled 2024-01-26 (×4): qty 1

## 2024-01-26 MED ORDER — SILODOSIN 8 MG PO CAPS
8.0000 mg | ORAL_CAPSULE | Freq: Every day | ORAL | Status: DC
  Administered 2024-01-26 – 2024-02-01 (×7): 8 mg via ORAL
  Filled 2024-01-26 (×8): qty 1

## 2024-01-26 MED ORDER — MEDIHONEY WOUND/BURN DRESSING EX PSTE
1.0000 | PASTE | Freq: Every day | CUTANEOUS | Status: DC
  Administered 2024-01-26 – 2024-02-01 (×7): 1 via TOPICAL

## 2024-01-26 MED ORDER — COLLAGENASE 250 UNIT/GM EX OINT: TOPICAL_OINTMENT | Freq: Every day | CUTANEOUS | Status: DC

## 2024-01-26 MED ORDER — SODIUM CHLORIDE 0.9 % IV BOLUS
500.0000 mL | Freq: Once | INTRAVENOUS | Status: AC
  Administered 2024-01-26: 500 mL via INTRAVENOUS

## 2024-01-26 NOTE — Progress Notes (Signed)
 Patient stable overnight. No alerts or need for RN outreach overnight. SPO2 ranged from 93% or above throughout the night. Other vitals per current health data also remained within parameters.

## 2024-01-26 NOTE — Progress Notes (Signed)
 Spoke with patient on the phone. States he had a good night, no diarrhea. Informed him paramedic will be visiting at 1030 and then we will video conference with Md and RN.

## 2024-01-26 NOTE — Progress Notes (Signed)
 Spoke with patient and his wife via phone for afternoon rounding. Pt still feeling weak and tired. He has been able to take in about 240cc jello, a popsicle (120cc) and 25 ounces of water. Pt has void 350 cc. Answered questions and reinforced calling for any need.

## 2024-01-26 NOTE — Plan of Care (Signed)
  Problem: Education: Goal: Knowledge of General Education information will improve Description: Including pain rating scale, medication(s)/side effects and non-pharmacologic comfort measures Outcome: Progressing   Problem: Clinical Measurements: Goal: Ability to maintain clinical measurements within normal limits will improve Outcome: Progressing Goal: Respiratory complications will improve Outcome: Progressing   Problem: Nutrition: Goal: Adequate nutrition will be maintained Outcome: Progressing   Problem: Pain Managment: Goal: General experience of comfort will improve and/or be controlled Outcome: Progressing   Problem: Safety: Goal: Ability to remain free from injury will improve Outcome: Progressing   Problem: Respiratory: Goal: Ability to maintain adequate ventilation will improve Outcome: Progressing Goal: Ability to maintain a clear airway will improve Outcome: Progressing

## 2024-01-26 NOTE — Progress Notes (Signed)
 Video rounding with MD ,patient, patient wife and daughter, paramedic and RN completed. Adjustments made in orders, answered pt and family questions,all in agreement with POC today.

## 2024-01-26 NOTE — Progress Notes (Signed)
 Phone call made to patient's home and RN was able to speak with patient's wife Barnie. She denies any patient needs at this time and states that the paramedics had just left their home shortly before I called. Plan made for evening medications and family encouraged to call RN via phone or tablet if needed prior to that time.

## 2024-01-26 NOTE — Progress Notes (Addendum)
 Pt seen for routine afternoon [facility] visit. Pt still feels somewhat weak and does not have much of an appetite, however denies any nausea and feels the medication for same helped earlier. Pt also states the burning with urination has further decreased even more.   Vital signs obtained prior to fluid administration and orthostatic vitals obtained halfway through as well. Pt still feels somewhat weak when standing. Lungs clear and slightly diminished in bases. No peripheral edema noted. Ted hose placed on pt.  Medication administered with nurse oversight.  Importance of nutrition reinforced during visit.  Pt states he feels too weak to obtain a weight at this time. Will try again tomorrow.  Dr. Alvia made aware of pt condition via secure chat throughout visit.

## 2024-01-26 NOTE — Progress Notes (Signed)
 Paramedic Chad Moreno arrived to the [name] house and found him sitting upright in his recliner, no obvious distress noted. PT is Aox4. PT is tired and stated that he feels weaker than yesterday. PT stated that he felt fine last night and slept well, this morning he had a piece of cheese toast and shortly afterwards started to feel sick to his stomach. PT denied having any vomiting or diarrhea. LBM was this morning (01/26/24) and was solid in consistency. [name] on scene stated that the PT did not take his glucerna drink last night.   Physical exam found negative DCAPBTLS to the head, neck, or face. PERRLA. Negative JVD or tracheal deviation. Chest expansion was equal with non-labored breathing. L/S were C/E in upper lobes and slightly diminished in the lower lobes. PT denied any SOB or difficulty breathing. ABD is soft, non-tender in all quadrants. PT reported slight nausea, ODT Zofran  was administered. PT stated that overnight he was urinating often, denied any dysuria today. Urine is hazy in appearance, negative hematuria or foul smell noted. Negative pedal or peripheral edema noted. PT has sacral pressure wounds on bilateral buttocks. Wound care was performed and updated picture was taken and in media section of chart. Orthostatic vitals were obtained and were noted to be positive. When standing up, PT was unable to do so for long without needing to sit down. PT denied any dizziness but stated that he just felt weak in his legs. PT was assisted while walking from his recliner, to the couch, and back to his recliner. [name] medications were administered as prescribed.  After MD visit, Spironolactone  and Lasix  were not administered. PT agreed with this decision.   PT and [name] denied having any other questions for paramedics. They were reminded if they needed anything to reach out to the virtual RN via the tablet.

## 2024-01-26 NOTE — Progress Notes (Signed)
 PROGRESS NOTE    Chad Moreno  FMW:[id number] DOB: 10/26/1940 DOA: 01/22/2024 PCP: [name], MD  Brief Narrative: 83 y.o. male with medical history significant of metastatic small cell lung cancer status post SBRT and currently on immunotherapy, chronic thrombocytopenia, hypertension, hyperlipidemia, gout, GERD. Recent hospital admission 7/7-7/16 for SBO, GI bleed from cecal colitis/diverticulosis, ESBL UTI, and AKI. Patient presents to the ED via EMS for evaluation of acute onset shortness of breath/respiratory distress. He was found to have findings concerning for pneumonia and fluid overload, was placed on antibiotics and diuretics and admitted to the hospital. His blood cultures were positive for ESBL E. Coli, ID were consulted and recommended 7 days of antibiotics and follow-up. TTE showed newly reduced EF, cardiology was consulted and recommended medical therapy. Ad[facility] to [facility] on 8/1.   8/3 patient identified as Chad Moreno [birth year]  I am located at home in [facility]  Assessment & Plan:   Principal Problem:   Acute CHF (congestive heart failure) (HCC) Active Problems:   Acute hypoxemic respiratory failure (HCC)   CAP (community acquired pneumonia)   SIRS (systemic inflammatory response syndrome) (HCC)   Elevated troponin   CHF exacerbation (HCC)   Pressure injury of skin   Acute systolic CHF (congestive heart failure) (HCC)   Hypokalemia  1) ESBL E. coli bacteremia - Bcx obtained and speciated on morning of 7/31, 1/4 bottles of ESBL E. coli. Started on meropenem . Last time he was hospitalized he was found to have 10^5 CFU's ESBL E. coli in the urine culture. He reported dysuria at that time and burning with urination. He has been afebrile with normal white count. Infectious disease was consulted and they recommend 7 total days of antibiotic (end date 8/6). Abx were changed to ertapenem  in anticipation to transfer to  [facility] Home program. Repeat bcx obtained on 7/31 @ 6:50 PM and 6:55 PM Pending. He complained of dysuria again 8/2.UA showed cloudy urine with moderate leukocytes >50 wbc.reflex culture was ordered.   2) Acute systolic heart failure- Presented with hypoxia. He was placed on IV Lasix  40 mg BID, diuresed well and was net -5 L, down 2 lbs. TTE on 8/1 showed EF 30-35%, LV RWMA, mild LVH, mildly reduced RV function, normal IVC. Cardiology was consulted. Cardiomyopathy thought to be likely secondary to his immunotherapy, stress CM, or ischemia. Patient deferred any invasive ischemic evaluation but was agreeable to medical management. Patient started on Lasix  40 mg daily, Entresto  24-26 mg twice daily and spironolactone  12.5 mg daily with plan for repeat TTE in 3 months. Patient does NOT want to take Entresto  anymore.he has NOT received Lasix  and Aldactone  8/2 and 8/3 due to weakness poor oral intake appears on the dry side with concentrated urine and positive orthostatics.WILL need TO REASSESS him in am prior to giving diuretics on 8/4. He received a 500 cc NS ON 8/3.   3) Acute hypoxic respiratory failure, resolved- Initially thought to be due to pneumonia and fluid overload. initially required BiPAP, he was placed on IV doxycycline  and IV lasix , and weaned off to room air on 8/1, respiratory status much improved and he is back to baseline. Repeat CXR shows improvement in aeration, doxycycline  discontinued on 8/1.Saturations remain above 92 percent on RA. Check ambulatory oxygen saturation.   4 ) Metastatic small cell lung cancer- Metastatic non-small cell lung cancer, adenocarcinoma, with BRAF V600E mutation and PD-L1 expression of 1%. Diagnosed in May 2024 with malignant pleural effusion  noted in December 2024. Currently on encorafenib  and binimetinib  since July 22, 2023. He reports recent diarrhea from current immunotherapy.Would like to discuss with his Oncologist, Dr. [name], about stopping or  starting an alternative treatment. Has follow up w/ oncology on Tuesday, 8/5 that he DOES NOT want to miss. Discussed with Dr. Jeannett team and they have changed his appt to a virtual visit on Tuesday at 10:00 am. Also needs the follow labs 8/4 prior to visit CBC w/ diff, CMP, iron  and TIBC, ferritin, folate and vitamin B12 ordered.   5) Anemia of chronic disease - Anemia likely from malignancy. Hgb stable at 10.3. follow labs 8/4. Continue Iron    6) Troponin elevation- Troponin 27->124. No chest pain or ischemic changes on EKG. Secondary to demand ischemia.    7) Hypokalemia- Potassium 3.1 8/2  replete with KCl 40 mEq x 2 and standing dose of K. 40 meq with lasix  daily.follow up bmp 8/4.   8) Unstageable pressure injury to R/L buttocks, POA-  wound care saw him 8/2 recommends Cleanse with Vashe, pat dry the peri-wound skin, not rinse. Apply Santyl  daily on the wound bed. Cover with foam dressing, change every 3 days or PRN. Pressure redistribution pad for chair.  9)Nausea- improved with zofran .continue PPI.(he has a history of sbo)he has been passing gas and moving bowels. I held his IRON  due to nausea.  10)BPH- restart rapaflo   Estimated body mass index is 24.01 kg/m as calculated from the following:   Height as of this encounter: 6' 1 (1.854 m).   Weight as of this encounter: 82.6 kg.  DVT prophylaxis:lovenox  Code Status:dnr Family Communication: wife at bedside Disposition Plan:  Status is: Inpatient Remains inpatient appropriate because: acute illness   Consultants:  ID CARDS WOUND CARE  Procedures: MIDLINE Antimicrobials: INVANZ   Subjective: Seen sitting up in chair wife at bed side Orthostatic again this am TED hose on Feels weaker than yesterday urine very concentrated poor po intake Labs pending for 8/4 Did not sleep well as he had frequency of urination dysuria better than yesterday Lasix  and aldactone  again held today Objective: Vitals:   01/25/24 0429  01/25/24 0542 01/25/24 1030 01/25/24 2000  BP:   134/84   Pulse:  71 73   Resp:  13  (!) 22  Temp:   (!) 97.3 F (36.3 C)   TempSrc:   Oral   SpO2: 93% 96% 95% 96%  Weight:      Height:        Intake/Output Summary (Last 24 hours) at 01/26/2024 0713 Last data filed at 01/25/2024 2000 Gross per 24 hour  Intake 1927 ml  Output --  Net 1927 ml   Filed Weights   01/23/24 1117 01/24/24 0454 01/24/24 1800  Weight: 88.4 kg 87.4 kg 82.6 kg    Examination: patient seen with [place] Paramedic at bed side Exam by Richerd Batty  General exam: Appears weak and dry Respiratory system:. Respiratory effort normal. Cardiovascular system: no tachycardia  No pedal edema. Gastrointestinal system:soft.non tender Central nervous system: Alert and oriented.  Extremities: no edema   Data Reviewed: I have personally reviewed following labs and imaging studies  CBC: Recent Labs  Lab 01/20/24 1003 01/22/24 2125 01/23/24 0346 01/24/24 0218  WBC 5.3 7.8 7.9 7.9  NEUTROABS  --  5.5  --   --   HGB 9.9* 10.5* 10.3* 9.9*  HCT 30.3* 33.2* 32.3* 30.9*  MCV 87.8 89.7 89.0 88.8  PLT 106* 165 140* 163   Basic  Metabolic Panel: Recent Labs  Lab 01/22/24 2125 01/23/24 0346 01/24/24 0218 01/25/24 0500  NA 141 141 145 142  K 3.7 3.1* 3.0* 3.1*  CL 107 104 105 107  CO2 22 24 29 26   GLUCOSE 129* 211* 117* 103*  BUN 15 12 14 22   CREATININE 0.81 0.89 0.87 1.00  CALCIUM 8.4* 8.5* 8.3* 7.5*  MG  --   --  1.9 1.8   GFR: Estimated Creatinine Clearance: 64.4 mL/min (by C-G formula based on SCr of 1 mg/dL). Liver Function Tests: Recent Labs  Lab 01/22/24 2125 01/24/24 0218  AST 39 20  ALT 30 23  ALKPHOS 60 56  BILITOT 1.0 0.9  PROT 6.1* 5.7*  ALBUMIN 2.9* 2.7*   No results for input(s): LIPASE, AMYLASE in the last 168 hours. No results for input(s): AMMONIA in the last 168 hours. Coagulation Profile: Recent Labs  Lab 01/20/24 1003  INR 1.1   Cardiac Enzymes: No  results for input(s): CKTOTAL, CKMB, CKMBINDEX, TROPONINI in the last 168 hours. BNP (last 3 results) No results for input(s): PROBNP in the last 8760 hours. HbA1C: No results for input(s): HGBA1C in the last 72 hours. CBG: No results for input(s): GLUCAP in the last 168 hours. Lipid Profile: No results for input(s): CHOL, HDL, LDLCALC, TRIG, CHOLHDL, LDLDIRECT in the last 72 hours. Thyroid  Function Tests: No results for input(s): TSH, T4TOTAL, FREET4, T3FREE, THYROIDAB in the last 72 hours. Anemia Panel: No results for input(s): VITAMINB12, FOLATE, FERRITIN, TIBC, IRON , RETICCTPCT in the last 72 hours. Sepsis Labs: Recent Labs  Lab 01/23/24 0346 01/23/24 0554  PROCALCITON 0.47  --   LATICACIDVEN 2.7* 2.0*    Recent Results (from the past 240 hours)  Resp panel by RT-PCR (RSV, Flu A&B, Covid) Anterior Nasal Swab     Status: None   Collection Time: 01/22/24  9:26 PM   Specimen: Anterior Nasal Swab  Result Value Ref Range Status   SARS Coronavirus 2 by RT PCR NEGATIVE NEGATIVE Final   Influenza A by PCR NEGATIVE NEGATIVE Final   Influenza B by PCR NEGATIVE NEGATIVE Final    Comment: (NOTE) The Xpert Xpress SARS-CoV-2/FLU/RSV plus assay is intended as an aid in the diagnosis of influenza from Nasopharyngeal swab specimens and should not be used as a sole basis for treatment. Nasal washings and aspirates are unacceptable for Xpert Xpress SARS-CoV-2/FLU/RSV testing.  Fact Sheet for Patients: BloggerCourse.com  Fact Sheet for Healthcare Providers: SeriousBroker.it  This test is not yet approved or cleared by the [place]  FDA and has been authorized for detection and/or diagnosis of SARS-CoV-2 by FDA under an Emergency Use Authorization (EUA). This EUA will remain in effect (meaning this test can be used) for the duration of the COVID-19 declaration under Section 564(b)(1) of  the Act, 21 U.S.C. section 360bbb-3(b)(1), unless the authorization is terminated or revoked.     Resp Syncytial Virus by PCR NEGATIVE NEGATIVE Final    Comment: (NOTE) Fact Sheet for Patients: BloggerCourse.com  Fact Sheet for Healthcare Providers: SeriousBroker.it  This test is not yet approved or cleared by the [place]  FDA and has been authorized for detection and/or diagnosis of SARS-CoV-2 by FDA under an Emergency Use Authorization (EUA). This EUA will remain in effect (meaning this test can be used) for the duration of the COVID-19 declaration under Section 564(b)(1) of the Act, 21 U.S.C. section 360bbb-3(b)(1), unless the authorization is terminated or revoked.  Performed at [facility], [address] [place], [place] [address]  MRSA Next Gen by PCR, Nasal     Status: None   Collection Time: 01/23/24  3:00 AM   Specimen: Nasal Mucosa; Nasal Swab  Result Value Ref Range Status   MRSA by PCR Next Gen NOT DETECTED NOT DETECTED Final    Comment: (NOTE) The GeneXpert MRSA Assay (FDA approved for NASAL specimens only), is one component of a comprehensive MRSA colonization surveillance program. It is not intended to diagnose MRSA infection nor to guide or monitor treatment for MRSA infections. Test performance is not FDA approved in patients less than 13 years old. Performed at [facility], [address], [place], [place] [address]   Culture, blood (Routine X 2) w Reflex to ID Panel     Status: Abnormal   Collection Time: 01/23/24  3:46 AM   Specimen: BLOOD RIGHT FOREARM  Result Value Ref Range Status   Specimen Description BLOOD RIGHT FOREARM  Final   Special Requests   Final    BOTTLES DRAWN AEROBIC AND ANAEROBIC Blood Culture adequate volume   Culture  Setup Time   Final    GRAM NEGATIVE RODS AEROBIC BOTTLE ONLY CRITICAL RESULT CALLED TO, READ BACK BY AND VERIFIED WITH: [facility] AMDREW GRETEL  [id number] AT 1748 BY EC Performed at [facility], [address], [place], [place] [address]    Culture (A)  Final    ESCHERICHIA COLI Confirmed Extended Spectrum Beta-Lactamase Producer (ESBL).  In bloodstream infections from ESBL organisms, carbapenems are preferred over piperacillin/tazobactam. They are shown to have a lower risk of mortality.    Report Status 01/25/2024 FINAL  Final   Organism ID, Bacteria ESCHERICHIA COLI  Final      Susceptibility   Escherichia coli - MIC*    AMPICILLIN >=32 RESISTANT Resistant     CEFEPIME 16 RESISTANT Resistant     CEFTAZIDIME RESISTANT Resistant     CEFTRIAXONE  >=64 RESISTANT Resistant     CIPROFLOXACIN 1 RESISTANT Resistant     GENTAMICIN <=1 SENSITIVE Sensitive     IMIPENEM <=0.25 SENSITIVE Sensitive     TRIMETH/SULFA >=320 RESISTANT Resistant     AMPICILLIN/SULBACTAM 16 INTERMEDIATE Intermediate     PIP/TAZO <=4 SENSITIVE Sensitive ug/mL    * ESCHERICHIA COLI  Culture, blood (Routine X 2) w Reflex to ID Panel     Status: None (Preliminary result)   Collection Time: 01/23/24  3:46 AM   Specimen: BLOOD LEFT HAND  Result Value Ref Range Status   Specimen Description BLOOD LEFT HAND  Final   Special Requests   Final    BOTTLES DRAWN AEROBIC AND ANAEROBIC Blood Culture adequate volume   Culture   Final    NO GROWTH 2 DAYS Performed at [facility], [address], [place], [place] [address]    Report Status PENDING  Incomplete  Blood Culture ID Panel (Reflexed)     Status: Abnormal   Collection Time: 01/23/24  3:46 AM  Result Value Ref Range Status   Enterococcus faecalis NOT DETECTED NOT DETECTED Final   Enterococcus Faecium NOT DETECTED NOT DETECTED Final   Listeria monocytogenes NOT DETECTED NOT DETECTED Final   Staphylococcus species NOT DETECTED NOT DETECTED Final   Staphylococcus aureus (BCID) NOT DETECTED NOT DETECTED Final   Staphylococcus epidermidis NOT DETECTED NOT DETECTED Final   Staphylococcus lugdunensis  NOT DETECTED NOT DETECTED Final   Streptococcus species NOT DETECTED NOT DETECTED Final   Streptococcus agalactiae NOT DETECTED NOT DETECTED Final   Streptococcus pneumoniae NOT DETECTED  NOT DETECTED Final   Streptococcus pyogenes NOT DETECTED NOT DETECTED Final   A.calcoaceticus-baumannii NOT DETECTED NOT DETECTED Final   Bacteroides fragilis NOT DETECTED NOT DETECTED Final   Enterobacterales DETECTED (A) NOT DETECTED Final    Comment: Enterobacterales represent a large order of gram negative bacteria, not a single organism. CRITICAL RESULT CALLED TO, READ BACK BY AND VERIFIED WITH: PHARMD ANDREW MEYER [id number] AT 1748 BY EC    Enterobacter cloacae complex NOT DETECTED NOT DETECTED Final   Escherichia coli DETECTED (A) NOT DETECTED Final    Comment: CRITICAL RESULT CALLED TO, READ BACK BY AND VERIFIED WITH: PHARMD PRENTICE POISSON [phone] AT 1748 BY EC    Klebsiella aerogenes NOT DETECTED NOT DETECTED Final   Klebsiella oxytoca NOT DETECTED NOT DETECTED Final   Klebsiella pneumoniae NOT DETECTED NOT DETECTED Final   Proteus species NOT DETECTED NOT DETECTED Final   Salmonella species NOT DETECTED NOT DETECTED Final   Serratia marcescens NOT DETECTED NOT DETECTED Final   Haemophilus influenzae NOT DETECTED NOT DETECTED Final   Neisseria meningitidis NOT DETECTED NOT DETECTED Final   Pseudomonas aeruginosa NOT DETECTED NOT DETECTED Final   Stenotrophomonas maltophilia NOT DETECTED NOT DETECTED Final   Candida albicans NOT DETECTED NOT DETECTED Final   Candida auris NOT DETECTED NOT DETECTED Final   Candida glabrata NOT DETECTED NOT DETECTED Final   Candida krusei NOT DETECTED NOT DETECTED Final   Candida parapsilosis NOT DETECTED NOT DETECTED Final   Candida tropicalis NOT DETECTED NOT DETECTED Final   Cryptococcus neoformans/gattii NOT DETECTED NOT DETECTED Final   CTX-M ESBL DETECTED (A) NOT DETECTED Final    Comment: CRITICAL RESULT CALLED TO, READ BACK BY AND VERIFIED WITH: PHARMD  PRENTICE POISSON [id number] AT 1748 BY EC (NOTE) Extended spectrum beta-lactamase detected. Recommend a carbapenem as initial therapy.      Carbapenem resistance IMP NOT DETECTED NOT DETECTED Final   Carbapenem resistance KPC NOT DETECTED NOT DETECTED Final   Carbapenem resistance NDM NOT DETECTED NOT DETECTED Final   Carbapenem resist OXA 48 LIKE NOT DETECTED NOT DETECTED Final   Carbapenem resistance VIM NOT DETECTED NOT DETECTED Final    Comment: Performed at [facility], [address], [place], [place] [address]  Respiratory (~20 pathogens) panel by PCR     Status: None   Collection Time: 01/23/24  4:15 AM   Specimen: Nasopharyngeal Swab; Respiratory  Result Value Ref Range Status   Adenovirus NOT DETECTED NOT DETECTED Final   Coronavirus 229E NOT DETECTED NOT DETECTED Final    Comment: (NOTE) The Coronavirus on the Respiratory Panel, DOES NOT test for the novel  Coronavirus (2019 nCoV)    Coronavirus HKU1 NOT DETECTED NOT DETECTED Final   Coronavirus NL63 NOT DETECTED NOT DETECTED Final   Coronavirus OC43 NOT DETECTED NOT DETECTED Final   Metapneumovirus NOT DETECTED NOT DETECTED Final   Rhinovirus / Enterovirus NOT DETECTED NOT DETECTED Final   Influenza A NOT DETECTED NOT DETECTED Final   Influenza B NOT DETECTED NOT DETECTED Final   Parainfluenza Virus 1 NOT DETECTED NOT DETECTED Final   Parainfluenza Virus 2 NOT DETECTED NOT DETECTED Final   Parainfluenza Virus 3 NOT DETECTED NOT DETECTED Final   Parainfluenza Virus 4 NOT DETECTED NOT DETECTED Final   Respiratory Syncytial Virus NOT DETECTED NOT DETECTED Final   Bordetella pertussis NOT DETECTED NOT DETECTED Final   Bordetella Parapertussis NOT DETECTED NOT DETECTED Final   Chlamydophila pneumoniae NOT DETECTED NOT DETECTED Final   Mycoplasma pneumoniae NOT  DETECTED NOT DETECTED Final    Comment: Performed at [facility], [address], [place], [place] [address]  Culture, blood (Routine X 2) w Reflex to ID  Panel     Status: None (Preliminary result)   Collection Time: 01/23/24  6:50 PM   Specimen: BLOOD RIGHT HAND  Result Value Ref Range Status   Specimen Description BLOOD RIGHT HAND  Final   Special Requests   Final    BOTTLES DRAWN AEROBIC ONLY Blood Culture results may not be optimal due to an inadequate volume of blood received in culture bottles   Culture   Final    NO GROWTH 2 DAYS Performed at [facility], [address] [place], [place] [address]    Report Status PENDING  Incomplete  Culture, blood (Routine X 2) w Reflex to ID Panel     Status: None (Preliminary result)   Collection Time: 01/23/24  6:55 PM   Specimen: BLOOD LEFT HAND  Result Value Ref Range Status   Specimen Description BLOOD LEFT HAND  Final   Special Requests   Final    BOTTLES DRAWN AEROBIC ONLY Blood Culture results may not be optimal due to an inadequate volume of blood received in culture bottles   Culture   Final    NO GROWTH 2 DAYS Performed at [facility], [address], [place], [place] [address]    Report Status PENDING  Incomplete         Radiology Studies: US  EKG SITE RITE Result Date: 01/24/2024 If Site Rite image not attached, placement could not be confirmed due to current cardiac rhythm.  ECHOCARDIOGRAM COMPLETE Result Date: 01/24/2024    ECHOCARDIOGRAM REPORT   Patient Name:   Chad Moreno Date of Exam: 01/24/2024 Medical Rec #:  [id number]       Height:       73.0 in Accession #:    [id number]      Weight:       192.7 lb Date of Birth:  1940-08-23       BSA:          2.118 m Patient Age:    82 years        BP:           131/78 mmHg Patient Gender: M               HR:           74 bpm. Exam Location:  Inpatient Procedure: 2D Echo, Cardiac Doppler, Color Doppler and Intracardiac            Opacification Agent (Both Spectral and Color Flow Doppler were            utilized during procedure). Indications:    CHF  History:        Patient has prior history of Echocardiogram  examinations, most                 recent 07/19/2023. Risk Factors:Hypertension.  Sonographer:    Philomena Daring Referring Phys: [id number] VASUNDHRA RATHORE IMPRESSIONS  1. Left ventricular ejection fraction, by estimation, is 30 to 35%. The left ventricle has moderately decreased function. The left ventricle demonstrates regional wall motion abnormalities (see scoring diagram/findings for description). There is mild left ventricular hypertrophy. Left ventricular diastolic parameters are indeterminate.  2. Right ventricular systolic function is mildly reduced. The right ventricular size is normal. Normal RV basal function with apical hypokinesis  3. Moderate pleural  effusion in the left lateral region.  4. The mitral valve was not well visualized. Trivial mitral valve regurgitation.  5. The aortic valve was not well visualized. Aortic valve regurgitation is not visualized. No aortic stenosis is present.  6. The inferior vena cava is normal in size with greater than 50% respiratory variability, suggesting right atrial pressure of 3 mmHg. Conclusion(s)/Recommendation(s): Normal LV basal function with apical akinesis. Could represent Takotsubo cardiomyopathy vs LAD territory infarct. FINDINGS  Left Ventricle: Left ventricular ejection fraction, by estimation, is 30 to 35%. The left ventricle has moderately decreased function. The left ventricle demonstrates regional wall motion abnormalities. Definity  contrast agent was given IV to delineate the left ventricular endocardial borders. The left ventricular internal cavity size was normal in size. There is mild left ventricular hypertrophy. Left ventricular diastolic parameters are indeterminate.  LV Wall Scoring: The entire apex is akinetic. The mid anteroseptal segment, mid inferolateral segment, mid anterolateral segment, mid inferoseptal segment, mid anterior segment, and mid inferior segment are hypokinetic. The basal anteroseptal segment, basal inferolateral segment,  basal anterolateral segment, basal anterior segment, basal inferior segment, and basal inferoseptal segment are normal. Right Ventricle: The right ventricular size is normal. No increase in right ventricular wall thickness. Right ventricular systolic function is mildly reduced. Left Atrium: Left atrial size was normal in size. Right Atrium: Right atrial size was normal in size. Pericardium: There is no evidence of pericardial effusion. Mitral Valve: The mitral valve was not well visualized. Trivial mitral valve regurgitation. Tricuspid Valve: The tricuspid valve is normal in structure. Tricuspid valve regurgitation is trivial. Aortic Valve: The aortic valve was not well visualized. Aortic valve regurgitation is not visualized. No aortic stenosis is present. Pulmonic Valve: The pulmonic valve was not well visualized. Pulmonic valve regurgitation is not visualized. Aorta: The aortic root is normal in size and structure. Venous: The inferior vena cava is normal in size with greater than 50% respiratory variability, suggesting right atrial pressure of 3 mmHg. IAS/Shunts: The interatrial septum was not well visualized. Additional Comments: There is a moderate pleural effusion in the left lateral region.  LEFT VENTRICLE PLAX 2D LVIDd:         4.90 cm LVIDs:         3.90 cm LV PW:         1.00 cm LV IVS:        1.10 cm LVOT diam:     2.00 cm LV SV:         45 LV SV Index:   21 LVOT Area:     3.14 cm  LV Volumes (MOD) LV vol d, MOD A2C: 84.1 ml LV vol d, MOD A4C: 147.0 ml LV vol s, MOD A2C: 43.5 ml LV vol s, MOD A4C: 73.9 ml LV SV MOD A2C:     40.6 ml LV SV MOD A4C:     147.0 ml LV SV MOD BP:      59.8 ml RIGHT VENTRICLE             IVC RV S prime:     10.90 cm/s  IVC diam: 1.30 cm TAPSE (M-mode): 2.1 cm LEFT ATRIUM           Index        RIGHT ATRIUM           Index LA diam:      3.20 cm 1.51 cm/m   RA Area:     13.50 cm LA Vol (A4C): 35.5 ml 16.76 ml/m  RA Volume:   28.00 ml  13.22 ml/m  AORTIC VALVE LVOT Vmax:    85.40 cm/s LVOT Vmean:  54.000 cm/s LVOT VTI:    0.144 m  AORTA Ao Root diam: 3.10 cm  SHUNTS Systemic VTI:  0.14 m Systemic Diam: 2.00 cm Lonni Nanas MD Electronically signed by Lonni Nanas MD Signature Date/Time: 01/24/2024/12:28:37 PM    Final    DG Chest 2 View Result Date: 01/24/2024 CLINICAL DATA:  [id number] CAP (community acquired pneumonia) [id number] EXAM: CHEST - 2 VIEW COMPARISON:  January 22, 2024 FINDINGS: Similar appearance of the left mid lung nodular opacity and masslike consolidation of the right perihilar region with fiducial markers present. Significantly improved aeration of the lungs. Small layering bilateral pleural effusions with persistent left basilar consolidation. No pneumothorax. Mild cardiomegaly. Tortuous aorta with aortic atherosclerosis. No acute fracture or destructive lesions. Multilevel thoracic osteophytosis. IMPRESSION: Significantly improved aeration of the lungs with decreasing pleural effusions, small on the left and trace on the right. Persistent retrocardiac airspace consolidation again noted. Electronically Signed   By: Rogelia Myers M.D.   On: 01/24/2024 12:06     Scheduled Meds:  acidophilus  1 capsule Oral Daily   celecoxib   200 mg Oral BID   enoxaparin  (LOVENOX ) injection  40 mg Subcutaneous Q24H   feeding supplement (GLUCERNA SHAKE)  237 mL Oral TID BM   ferrous sulfate   325 mg Oral QODAY   furosemide   40 mg Oral Daily   Gerhardt's butt cream   Topical BID   leptospermum manuka honey  1 Application Topical Daily   pantoprazole   40 mg Oral BID   potassium chloride   40 mEq Oral Daily   sodium chloride  flush  10-40 mL Intracatheter Q12H   spironolactone   12.5 mg Oral Daily   Continuous Infusions:  ertapenem  Stopped (01/25/24 1255)     LOS: 3 days     Almarie KANDICE Hoots, MD 01/26/2024, 7:13 AM

## 2024-01-27 ENCOUNTER — Inpatient Hospital Stay: Admitting: Nurse Practitioner

## 2024-01-27 DIAGNOSIS — R42 Dizziness and giddiness: Secondary | ICD-10-CM | POA: Diagnosis not present

## 2024-01-27 LAB — CBC WITH DIFFERENTIAL/PLATELET
Abs Immature Granulocytes: 0.04 K/uL (ref 0.00–0.07)
Basophils Absolute: 0 K/uL (ref 0.0–0.1)
Basophils Relative: 0 %
Eosinophils Absolute: 0.1 K/uL (ref 0.0–0.5)
Eosinophils Relative: 2 %
HCT: 34.4 % — ABNORMAL LOW (ref 39.0–52.0)
Hemoglobin: 11.1 g/dL — ABNORMAL LOW (ref 13.0–17.0)
Immature Granulocytes: 1 %
Lymphocytes Relative: 14 %
Lymphs Abs: 0.6 K/uL — ABNORMAL LOW (ref 0.7–4.0)
MCH: 28.8 pg (ref 26.0–34.0)
MCHC: 32.3 g/dL (ref 30.0–36.0)
MCV: 89.1 fL (ref 80.0–100.0)
Monocytes Absolute: 0.4 K/uL (ref 0.1–1.0)
Monocytes Relative: 10 %
Neutro Abs: 3 K/uL (ref 1.7–7.7)
Neutrophils Relative %: 73 %
Platelets: 142 K/uL — ABNORMAL LOW (ref 150–400)
RBC: 3.86 MIL/uL — ABNORMAL LOW (ref 4.22–5.81)
RDW: 15.9 % — ABNORMAL HIGH (ref 11.5–15.5)
WBC: 4.1 K/uL (ref 4.0–10.5)
nRBC: 0 % (ref 0.0–0.2)

## 2024-01-27 LAB — COMPREHENSIVE METABOLIC PANEL WITH GFR
ALT: 23 U/L (ref 0–44)
AST: 22 U/L (ref 15–41)
Albumin: 2.8 g/dL — ABNORMAL LOW (ref 3.5–5.0)
Alkaline Phosphatase: 59 U/L (ref 38–126)
Anion gap: 9 (ref 5–15)
BUN: 20 mg/dL (ref 8–23)
CO2: 22 mmol/L (ref 22–32)
Calcium: 8.3 mg/dL — ABNORMAL LOW (ref 8.9–10.3)
Chloride: 106 mmol/L (ref 98–111)
Creatinine, Ser: 0.86 mg/dL (ref 0.61–1.24)
GFR, Estimated: >60 mL/min (ref >60)
Glucose, Bld: 113 mg/dL — ABNORMAL HIGH (ref 70–99)
Potassium: 4.5 mmol/L (ref 3.5–5.1)
Sodium: 137 mmol/L (ref 135–145)
Total Bilirubin: 0.8 mg/dL (ref 0.0–1.2)
Total Protein: 5.4 g/dL — ABNORMAL LOW (ref 6.5–8.1)

## 2024-01-27 LAB — VITAMIN B12: Vitamin B-12: 3299 pg/mL — ABNORMAL HIGH (ref 180–914)

## 2024-01-27 LAB — IRON AND TIBC
Iron: 34 ug/dL — ABNORMAL LOW (ref 45–182)
Saturation Ratios: 14 % — ABNORMAL LOW (ref 17.9–39.5)
TIBC: 245 ug/dL — ABNORMAL LOW (ref 250–450)
UIBC: 211 ug/dL

## 2024-01-27 LAB — FOLATE: Folate: 25.6 ng/mL (ref >5.9)

## 2024-01-27 LAB — FERRITIN: Ferritin: 523 ng/mL — ABNORMAL HIGH (ref 24–336)

## 2024-01-27 MED ORDER — POTASSIUM CHLORIDE CRYS ER 20 MEQ PO TBCR
40.0000 meq | EXTENDED_RELEASE_TABLET | Freq: Once | ORAL | Status: AC
Start: 2024-01-27 — End: 2024-01-27
  Administered 2024-01-27: 40 meq via ORAL
  Filled 2024-01-27: qty 2

## 2024-01-27 MED ORDER — POTASSIUM CHLORIDE CRYS ER 20 MEQ PO TBCR
20.0000 meq | EXTENDED_RELEASE_TABLET | Freq: Every day | ORAL | Status: DC
  Administered 2024-01-27: 20 meq via ORAL
  Filled 2024-01-27 (×2): qty 1

## 2024-01-27 MED ORDER — FUROSEMIDE 20 MG PO TABS
20.0000 mg | ORAL_TABLET | Freq: Every day | ORAL | Status: DC
  Administered 2024-01-27 – 2024-01-28 (×2): 20 mg via ORAL
  Filled 2024-01-27 (×3): qty 1

## 2024-01-27 MED ORDER — SPIRONOLACTONE 12.5 MG HALF TABLET
12.5000 mg | ORAL_TABLET | Freq: Every day | ORAL | Status: DC
  Administered 2024-01-27 – 2024-01-29 (×3): 12.5 mg via ORAL
  Filled 2024-01-27 (×4): qty 1

## 2024-01-27 MED ORDER — FERROUS SULFATE 325 (65 FE) MG PO TABS
325.0000 mg | ORAL_TABLET | ORAL | Status: DC
  Administered 2024-01-27 – 2024-01-31 (×3): 325 mg via ORAL
  Filled 2024-01-27 (×4): qty 1

## 2024-01-27 MED ORDER — VITAMIN C 500 MG PO TABS
500.0000 mg | ORAL_TABLET | Freq: Every day | ORAL | Status: DC
  Administered 2024-01-28 – 2024-02-01 (×5): 500 mg via ORAL
  Filled 2024-01-27 (×6): qty 1

## 2024-01-27 MED ORDER — VITAMIN C 500 MG PO TABS: 500.0000 mg | ORAL_TABLET | Freq: Every day | ORAL | Status: DC

## 2024-01-27 NOTE — TOC Transition Note (Signed)
 Transition of Care [facility]) - Discharge Note   Patient Details  Name: Chad Moreno MRN: [id number] Date of Birth: 17-Dec-1940  Transition of Care [facility] CM/SW Contact:  Debarah Saunas, RN Phone Number: 01/27/2024, 2:57 PM   Clinical Narrative:    RNCM received call from PCP office regarding hospital follow-up appointment.  PCP nurse states that their process is to contact pt after receiving discharge summary.  They will be on the lookout for his report and follow-up ASAP.   Final next level of care: Home/Self Care Barriers to Discharge: Continued Medical Work up   Patient Goals and CMS Choice Patient states their goals for this hospitalization and ongoing recovery are:: to return home and get better, get back to himself          Discharge Placement                       Discharge Plan and Services Additional resources added to the After Visit Summary for     Discharge Planning Services: CM Consult                                 Social Drivers of Health (SDOH) Interventions SDOH Screenings   Food Insecurity: No Food Insecurity (01/23/2024)  Housing: Low Risk  (01/24/2024)  Transportation Needs: No Transportation Needs (01/23/2024)  Utilities: Not At Risk (01/23/2024)  Depression (PHQ2-9): Low Risk  (07/31/2022)  Social Connections: Socially Integrated (01/23/2024)  Tobacco Use: Medium Risk (01/22/2024)     Readmission Risk Interventions    01/25/2024    8:49 AM 12/31/2023    1:01 PM  Readmission Risk Prevention Plan  Transportation Screening Complete Complete  PCP or Specialist Appt within 5-7 Days Not Complete Complete  Not Complete comments will obtain closer to discharge date   Home Care Screening Complete Complete  Medication Review (RN CM) Complete Complete

## 2024-01-27 NOTE — Progress Notes (Signed)
  76- RN introduction, Patient identifiers completed, plan for overnight monitoring, and [facility] contact information reviewed. Patient /Spouse preferred and agreed to a video call for medication closer to 2100.   2054- Patient /caregiver contact via Video call. Patient identifiers review  patient sitting up in chair with spouse at side. Patient A&O x4. Patient denies pain, dizziness, discomfort or health concerns. Education on safety, dizziness, medications, S/Sx hypotension and respiratory distress.  Assessment and medication administration assistance completed. Overnight monitoring plan and [facility] contact information, when to call RN vs 911 for emergency needs and s/sx of change in condition, plan for AM medications and AM oncology appointment reviewed. Per Patient and Spouse, no other questions or concerns at the time of video call. Patient will continue to be monitored.

## 2024-01-27 NOTE — Plan of Care (Signed)
  Problem: Clinical Measurements: Goal: Ability to maintain clinical measurements within normal limits will improve Outcome: Progressing   Problem: Clinical Measurements: Goal: Will remain free from infection Outcome: Progressing   Problem: Clinical Measurements: Goal: Cardiovascular complication will be avoided Outcome: Progressing   Problem: Nutrition: Goal: Adequate nutrition will be maintained Outcome: Progressing   Problem: Pain Managment: Goal: General experience of comfort will improve and/or be controlled Outcome: Progressing   Problem: Safety: Goal: Ability to remain free from injury will improve Outcome: Progressing   Problem: Respiratory: Goal: Ability to maintain adequate ventilation will improve Outcome: Progressing

## 2024-01-27 NOTE — Plan of Care (Signed)
  Problem: Education: Goal: Knowledge of General Education information will improve Description: Including pain rating scale, medication(s)/side effects and non-pharmacologic comfort measures 01/27/2024 1316 by Emmit Oriley, Shanda GAILS, RN Outcome: Progressing 01/27/2024 1316 by Libni Fusaro, Shanda GAILS, RN Outcome: Progressing   Problem: Health Behavior/Discharge Planning: Goal: Ability to manage health-related needs will improve 01/27/2024 1316 by Acy Orsak, Shanda GAILS, RN Outcome: Progressing 01/27/2024 1316 by Sherril Shipman, Shanda GAILS, RN Outcome: Progressing   Problem: Clinical Measurements: Goal: Ability to maintain clinical measurements within normal limits will improve 01/27/2024 1316 by Shakya Sebring, Shanda GAILS, RN Outcome: Progressing 01/27/2024 1316 by Janella Rogala, Shanda GAILS, RN Outcome: Progressing Goal: Will remain free from infection 01/27/2024 1316 by Teniyah Seivert, Shanda GAILS, RN Outcome: Progressing 01/27/2024 1316 by Hermenegildo Clausen, Shanda GAILS, RN Outcome: Progressing Goal: Diagnostic test results will improve 01/27/2024 1316 by Caitlin Hillmer, Shanda GAILS, RN Outcome: Progressing 01/27/2024 1316 by Oluwatoyin Banales, Shanda GAILS, RN Outcome: Progressing Goal: Respiratory complications will improve 01/27/2024 1316 by Deandra Gadson, Shanda GAILS, RN Outcome: Progressing 01/27/2024 1316 by Jaliza Seifried, Shanda GAILS, RN Outcome: Progressing Goal: Cardiovascular complication will be avoided 01/27/2024 1316 by Raphaella Larkin, Shanda GAILS, RN Outcome: Progressing 01/27/2024 1316 by Keshia Weare, Shanda GAILS, RN Outcome: Progressing   Problem: Activity: Goal: Risk for activity intolerance will decrease 01/27/2024 1316 by Rizwan Kuyper, Shanda GAILS, RN Outcome: Progressing 01/27/2024 1316 by Takai Chiaramonte, Shanda GAILS, RN Outcome: Progressing   Problem: Nutrition: Goal: Adequate nutrition will be maintained 01/27/2024 1316 by Enos Muhl, Shanda GAILS, RN Outcome: Progressing 01/27/2024 1316 by Brad Lieurance, Shanda GAILS, RN Outcome: Progressing   Problem: Coping: Goal: Level of anxiety will decrease 01/27/2024  1316 by Alexzia Kasler, Shanda GAILS, RN Outcome: Progressing 01/27/2024 1316 by Diedre Maclellan, Shanda GAILS, RN Outcome: Progressing   Problem: Elimination: Goal: Will not experience complications related to bowel motility 01/27/2024 1316 by Nabeel Gladson, Shanda GAILS, RN Outcome: Progressing 01/27/2024 1316 by Suzzette Gasparro, Shanda GAILS, RN Outcome: Progressing Goal: Will not experience complications related to urinary retention 01/27/2024 1316 by Edker Punt, Shanda GAILS, RN Outcome: Progressing 01/27/2024 1316 by Aryam Zhan, Shanda GAILS, RN Outcome: Progressing   Problem: Pain Managment: Goal: General experience of comfort will improve and/or be controlled 01/27/2024 1316 by Sharee Sturdy, Shanda GAILS, RN Outcome: Progressing 01/27/2024 1316 by Audon Heymann, Shanda GAILS, RN Outcome: Progressing   Problem: Safety: Goal: Ability to remain free from injury will improve 01/27/2024 1316 by Lonnette Shrode, Shanda GAILS, RN Outcome: Progressing 01/27/2024 1316 by Kimiah Hibner, Shanda GAILS, RN Outcome: Progressing   Problem: Skin Integrity: Goal: Risk for impaired skin integrity will decrease 01/27/2024 1316 by Reyne Falconi, Shanda GAILS, RN Outcome: Progressing 01/27/2024 1316 by Niall Illes, Shanda GAILS, RN Outcome: Progressing   Problem: Activity: Goal: Ability to tolerate increased activity will improve 01/27/2024 1316 by Marieta Markov, Shanda GAILS, RN Outcome: Progressing 01/27/2024 1316 by Jawad Wiacek, Shanda GAILS, RN Outcome: Progressing   Problem: Clinical Measurements: Goal: Ability to maintain a body temperature in the normal range will improve 01/27/2024 1316 by Lagena Strand, Shanda GAILS, RN Outcome: Progressing 01/27/2024 1316 by Irving Bloor, Shanda GAILS, RN Outcome: Progressing   Problem: Respiratory: Goal: Ability to maintain adequate ventilation will improve 01/27/2024 1316 by Darra Rosa, Shanda GAILS, RN Outcome: Progressing 01/27/2024 1316 by Seldon Barrell, Shanda GAILS, RN Outcome: Progressing Goal: Ability to maintain a clear airway will improve 01/27/2024 1316 by Seniyah Esker, Shanda GAILS, RN Outcome:  Progressing 01/27/2024 1316 by Tylique Aull, Shanda GAILS, RN Outcome: Progressing   Problem: Activity: Goal: Capacity to carry out activities will improve Outcome: Progressing   Problem: Cardiac: Goal: Ability to achieve and maintain adequate cardiopulmonary perfusion will improve Outcome: Progressing

## 2024-01-27 NOTE — Progress Notes (Signed)
 PROGRESS NOTE    Chad Moreno  FMW:[id number] DOB: 1940/11/28 DOA: 01/22/2024 PCP: [name], MD  Brief Narrative: 83 y.o. male with medical history significant of metastatic small cell lung cancer status post SBRT and currently on immunotherapy, chronic thrombocytopenia, hypertension, hyperlipidemia, gout, GERD. Recent hospital admission 7/7-7/16 for SBO, GI bleed from cecal colitis/diverticulosis, ESBL UTI, and AKI. Patient presented to the ED via EMS for evaluation of acute onset shortness of breath/respiratory distress. He was found to have findings concerning for  fluid overload, was placed on diuretics and admitted to the hospital. His blood cultures were positive for ESBL E. Coli, ID were consulted and recommended 7 days of antibiotics. TTE showed newly reduced EF, cardiology was consulted and recommended medical therapy. [facility] on 8/1.   8/4 patient identified as Chad Moreno [birth year]  I am located at home in [place] [facility] [employer]  Assessment & Plan:   Principal Problem:   Acute CHF (congestive heart failure) (HCC) Active Problems:   Acute hypoxemic respiratory failure (HCC)   CAP (community acquired pneumonia)   SIRS (systemic inflammatory response syndrome) (HCC)   Elevated troponin   CHF exacerbation (HCC)   Pressure injury of skin   Acute systolic CHF (congestive heart failure) (HCC)   Hypokalemia  1) ESBL E. coli bacteremia - Blood culture on 7/31 grew 1/4 bottles of ESBL E. coli. Started on meropenem  Infectious disease was consulted and they recommend 7 total days of antibiotic (end date 8/6). Abx were changed to ertapenem  in anticipation to transfer to [facility] program.  Repeat bcx obtained on 7/31 Pending.No growth so far. He complained of dysuria  8/2.UA showed cloudy urine with moderate leukocytes >50 wbc.reflex culture was ordered.How ever this has since then resolved and frequency has improved after  restarting Rapaflo  and a fluid bolus.   2) Acute systolic heart failure- Presented with hypoxia.Treated with IV Lasix  40 mg BID, initially diuresed well and was net -5 L, down 2 lbs.  TTE on 8/1 showed EF 30-35%, LV RWMA, mild LVH, mildly reduced RV function, normal IVC. Cardiology was consulted. Cardiomyopathy was thought to be likely secondary to his immunotherapy, stress CM, or ischemia. Patient deferred any invasive ischemic evaluation but was agreeable to medical management. Patient started on Lasix  40 mg daily, Entresto  24-26 mg twice daily and spironolactone  12.5 mg daily with plan for repeat TTE in 3 months. Patient does NOT want to take Entresto  anymore.he has NOT received Lasix  and Aldactone  8/2 and 8/3 due to weakness poor oral intake appears on the dry side with concentrated urine and positive orthostatics.He received a 500 cc NS ON 8/3. On 8/4 he felt better nausea resolved,no diarrhea,no dizziness.Restarted Lasix  at 20 mg daily with Aldactone  12.5 mg daily.   3) Acute hypoxic respiratory failure, resolved- Initially thought to be due to pneumonia and fluid overload,but mostly due to CHF. initially required BiPAP, he was placed on IV doxycycline  and IV lasix , and weaned off to room air on 8/1, respiratory status much improved and he is back to baseline. Repeat CXR shows improvement in aeration, doxycycline  discontinued on 8/1.Saturations remain above 92 percent on RA. Check ambulatory oxygen saturation above 90 %.   4 ) Metastatic small cell lung cancer- Metastatic non-small cell lung cancer, adenocarcinoma, with BRAF V600E mutation and PD-L1 expression of 1%. Diagnosed in May 2024 with malignant pleural effusion noted in December 2024. Currently on encorafenib  and binimetinib  since July 22, 2023. He reports recent diarrhea  from current immunotherapy.Would like to discuss with his Oncologist, Dr. [name], about stopping or starting an alternative treatment. Has follow up w/ oncology on  Tuesday, 8/5.This has been arranged virtually for 10 am. Labs prior to visit cbc cmp iron  panel done.  5) Anemia likely multifactorial Iron  deficiency, malignancy,chronic disease. Iron  panel indicates Iron  deficiency.Continue every other day iron  Added Vitamin C  for better absorption. CBC pending    6) Troponin elevation- Troponin 27->124. No chest pain or ischemic changes on EKG. Secondary to demand ischemia.    7) Hypokalemia- resolved   8) Unstageable pressure injury to R/L buttocks, POA-  wound care saw him 8/2 recommends Cleanse with Vashe, pat dry the peri-wound skin, not rinse. Apply Santyl  daily on the wound bed. Cover with foam dressing, change every 3 days or PRN. Pressure redistribution pad for chair.  9)Nausea- resolved .  10)BPH- Continue rapaflo   Estimated body mass index is 24.01 kg/m as calculated from the following:   Height as of this encounter: 6' 1 (1.854 m).   Weight as of this encounter: 82.6 kg.  DVT prophylaxis:lovenox  Code Status:dnr Family Communication: wife at bedside Disposition Plan:  Status is: Inpatient Remains inpatient appropriate because: acute illness   Consultants:  ID CARDS WOUND CARE  Procedures: MIDLINE Antimicrobials: INVANZ   Subjective:  Sitting up in chair Feels better than yesterday no dizziness po intake improved no nausea no diarrhea.urinary frequency improved. No sob  Received 500 CC ns 8/3 Encouraged to use Ted hose  Objective: Vitals:   01/25/24 2000 01/26/24 1000 01/26/24 1700 01/26/24 1743  BP:  111/73 (!) 152/88   Pulse:  (!) 101 72   Resp: (!) 22 16 16    Temp:  (!) 97.3 F (36.3 C) 98.5 F (36.9 C)   TempSrc:  Oral Oral   SpO2: 96% 97% 96% 99%  Weight:      Height:        Intake/Output Summary (Last 24 hours) at 01/27/2024 1104 Last data filed at 01/27/2024 9170 Gross per 24 hour  Intake 1980 ml  Output 900 ml  Net 1080 ml   Filed Weights   01/23/24 1117 01/24/24 0454 01/24/24 1800  Weight: 88.4 kg  87.4 kg 82.6 kg    Examination: patient seen with Norman Miner Exam by Norman Broom exam: Appears in no distress Respiratory system:. Respiratory effort normal. Cardiovascular system: no tachycardia  No pedal edema. Gastrointestinal system:soft.non tender Central nervous system: Alert and oriented.  Extremities: no edema  Data Reviewed: I have personally reviewed following labs and imaging studies  CBC: Recent Labs  Lab 01/22/24 2125 01/23/24 0346 01/24/24 0218  WBC 7.8 7.9 7.9  NEUTROABS 5.5  --   --   HGB 10.5* 10.3* 9.9*  HCT 33.2* 32.3* 30.9*  MCV 89.7 89.0 88.8  PLT 165 140* 163   Basic Metabolic Panel: Recent Labs  Lab 01/22/24 2125 01/23/24 0346 01/24/24 0218 01/25/24 0500  NA 141 141 145 142  K 3.7 3.1* 3.0* 3.1*  CL 107 104 105 107  CO2 22 24 29 26   GLUCOSE 129* 211* 117* 103*  BUN 15 12 14 22   CREATININE 0.81 0.89 0.87 1.00  CALCIUM 8.4* 8.5* 8.3* 7.5*  MG  --   --  1.9 1.8   GFR: Estimated Creatinine Clearance: 64.4 mL/min (by C-G formula based on SCr of 1 mg/dL). Liver Function Tests: Recent Labs  Lab 01/22/24 2125 01/24/24 0218  AST 39 20  ALT 30 23  ALKPHOS 60 56  BILITOT 1.0  0.9  PROT 6.1* 5.7*  ALBUMIN 2.9* 2.7*   No results for input(s): LIPASE, AMYLASE in the last 168 hours. No results for input(s): AMMONIA in the last 168 hours. Coagulation Profile: No results for input(s): INR, PROTIME in the last 168 hours.  Cardiac Enzymes: No results for input(s): CKTOTAL, CKMB, CKMBINDEX, TROPONINI in the last 168 hours. BNP (last 3 results) No results for input(s): PROBNP in the last 8760 hours. HbA1C: No results for input(s): HGBA1C in the last 72 hours. CBG: No results for input(s): GLUCAP in the last 168 hours. Lipid Profile: No results for input(s): CHOL, HDL, LDLCALC, TRIG, CHOLHDL, LDLDIRECT in the last 72 hours. Thyroid  Function Tests: No results for input(s): TSH, T4TOTAL, FREET4,  T3FREE, THYROIDAB in the last 72 hours. Anemia Panel: No results for input(s): VITAMINB12, FOLATE, FERRITIN, TIBC, IRON , RETICCTPCT in the last 72 hours. Sepsis Labs: Recent Labs  Lab 01/23/24 0346 01/23/24 0554  PROCALCITON 0.47  --   LATICACIDVEN 2.7* 2.0*    Recent Results (from the past 240 hours)  Resp panel by RT-PCR (RSV, Flu A&B, Covid) Anterior Nasal Swab     Status: None   Collection Time: 01/22/24  9:26 PM   Specimen: Anterior Nasal Swab  Result Value Ref Range Status   SARS Coronavirus 2 by RT PCR NEGATIVE NEGATIVE Final   Influenza A by PCR NEGATIVE NEGATIVE Final   Influenza B by PCR NEGATIVE NEGATIVE Final    Comment: (NOTE) The Xpert Xpress SARS-CoV-2/FLU/RSV plus assay is intended as an aid in the diagnosis of influenza from Nasopharyngeal swab specimens and should not be used as a sole basis for treatment. Nasal washings and aspirates are unacceptable for Xpert Xpress SARS-CoV-2/FLU/RSV testing.  Fact Sheet for Patients: BloggerCourse.com  Fact Sheet for Healthcare Providers: SeriousBroker.it  This test is not yet approved or cleared by the [place]  FDA and has been authorized for detection and/or diagnosis of SARS-CoV-2 by FDA under an Emergency Use Authorization (EUA). This EUA will remain in effect (meaning this test can be used) for the duration of the COVID-19 declaration under Section 564(b)(1) of the Act, 21 U.S.C. section 360bbb-3(b)(1), unless the authorization is terminated or revoked.     Resp Syncytial Virus by PCR NEGATIVE NEGATIVE Final    Comment: (NOTE) Fact Sheet for Patients: BloggerCourse.com  Fact Sheet for Healthcare Providers: SeriousBroker.it  This test is not yet approved or cleared by the [place]  FDA and has been authorized for detection and/or diagnosis of SARS-CoV-2 by FDA under an Emergency Use  Authorization (EUA). This EUA will remain in effect (meaning this test can be used) for the duration of the COVID-19 declaration under Section 564(b)(1) of the Act, 21 U.S.C. section 360bbb-3(b)(1), unless the authorization is terminated or revoked.  Performed at [facility], [address] [place], [place] [address]   MRSA Next Gen by PCR, Nasal     Status: None   Collection Time: 01/23/24  3:00 AM   Specimen: Nasal Mucosa; Nasal Swab  Result Value Ref Range Status   MRSA by PCR Next Gen NOT DETECTED NOT DETECTED Final    Comment: (NOTE) The GeneXpert MRSA Assay (FDA approved for NASAL specimens only), is one component of a comprehensive MRSA colonization surveillance program. It is not intended to diagnose MRSA infection nor to guide or monitor treatment for MRSA infections. Test performance is not FDA approved in patients less than 25 years old. Performed at [facility], [address] [place], [place]  [address]   Culture, blood (Routine X 2) w Reflex to ID Panel     Status: Abnormal   Collection Time: 01/23/24  3:46 AM   Specimen: BLOOD RIGHT FOREARM  Result Value Ref Range Status   Specimen Description BLOOD RIGHT FOREARM  Final   Special Requests   Final    BOTTLES DRAWN AEROBIC AND ANAEROBIC Blood Culture adequate volume   Culture  Setup Time   Final    GRAM NEGATIVE RODS AEROBIC BOTTLE ONLY CRITICAL RESULT CALLED TO, READ BACK BY AND VERIFIED WITH: [facility] AMDREW GRETEL [id number] AT 1748 BY EC Performed at [facility], [address], [place], [place] [address]    Culture (A)  Final    ESCHERICHIA COLI Confirmed Extended Spectrum Beta-Lactamase Producer (ESBL).  In bloodstream infections from ESBL organisms, carbapenems are preferred over piperacillin/tazobactam. They are shown to have a lower risk of mortality.    Report Status 01/25/2024 FINAL  Final   Organism ID, Bacteria ESCHERICHIA COLI  Final      Susceptibility   Escherichia coli - MIC*     AMPICILLIN >=32 RESISTANT Resistant     CEFEPIME 16 RESISTANT Resistant     CEFTAZIDIME RESISTANT Resistant     CEFTRIAXONE  >=64 RESISTANT Resistant     CIPROFLOXACIN 1 RESISTANT Resistant     GENTAMICIN <=1 SENSITIVE Sensitive     IMIPENEM <=0.25 SENSITIVE Sensitive     TRIMETH/SULFA >=320 RESISTANT Resistant     AMPICILLIN/SULBACTAM 16 INTERMEDIATE Intermediate     PIP/TAZO <=4 SENSITIVE Sensitive ug/mL    * ESCHERICHIA COLI  Culture, blood (Routine X 2) w Reflex to ID Panel     Status: None (Preliminary result)   Collection Time: 01/23/24  3:46 AM   Specimen: BLOOD LEFT HAND  Result Value Ref Range Status   Specimen Description BLOOD LEFT HAND  Final   Special Requests   Final    BOTTLES DRAWN AEROBIC AND ANAEROBIC Blood Culture adequate volume   Culture   Final    NO GROWTH 4 DAYS Performed at [facility], [address] [place], [place] [address]    Report Status PENDING  Incomplete  Blood Culture ID Panel (Reflexed)     Status: Abnormal   Collection Time: 01/23/24  3:46 AM  Result Value Ref Range Status   Enterococcus faecalis NOT DETECTED NOT DETECTED Final   Enterococcus Faecium NOT DETECTED NOT DETECTED Final   Listeria monocytogenes NOT DETECTED NOT DETECTED Final   Staphylococcus species NOT DETECTED NOT DETECTED Final   Staphylococcus aureus (BCID) NOT DETECTED NOT DETECTED Final   Staphylococcus epidermidis NOT DETECTED NOT DETECTED Final   Staphylococcus lugdunensis NOT DETECTED NOT DETECTED Final   Streptococcus species NOT DETECTED NOT DETECTED Final   Streptococcus agalactiae NOT DETECTED NOT DETECTED Final   Streptococcus pneumoniae NOT DETECTED NOT DETECTED Final   Streptococcus pyogenes NOT DETECTED NOT DETECTED Final   A.calcoaceticus-baumannii NOT DETECTED NOT DETECTED Final   Bacteroides fragilis NOT DETECTED NOT DETECTED Final   Enterobacterales DETECTED (A) NOT DETECTED Final    Comment: Enterobacterales represent a large order of gram  negative bacteria, not a single organism. CRITICAL RESULT CALLED TO, READ BACK BY AND VERIFIED WITH: [facility] ANDREW MEYER [id number] AT 1748 BY EC    Enterobacter cloacae complex NOT DETECTED NOT DETECTED Final   Escherichia coli DETECTED (A) NOT DETECTED Final    Comment: CRITICAL RESULT CALLED TO, READ BACK BY AND VERIFIED WITH: MAYA PRENTICE GRETEL [id number] AT 1748  BY EC    Klebsiella aerogenes NOT DETECTED NOT DETECTED Final   Klebsiella oxytoca NOT DETECTED NOT DETECTED Final   Klebsiella pneumoniae NOT DETECTED NOT DETECTED Final   Proteus species NOT DETECTED NOT DETECTED Final   Salmonella species NOT DETECTED NOT DETECTED Final   Serratia marcescens NOT DETECTED NOT DETECTED Final   Haemophilus influenzae NOT DETECTED NOT DETECTED Final   Neisseria meningitidis NOT DETECTED NOT DETECTED Final   Pseudomonas aeruginosa NOT DETECTED NOT DETECTED Final   Stenotrophomonas maltophilia NOT DETECTED NOT DETECTED Final   Candida albicans NOT DETECTED NOT DETECTED Final   Candida auris NOT DETECTED NOT DETECTED Final   Candida glabrata NOT DETECTED NOT DETECTED Final   Candida krusei NOT DETECTED NOT DETECTED Final   Candida parapsilosis NOT DETECTED NOT DETECTED Final   Candida tropicalis NOT DETECTED NOT DETECTED Final   Cryptococcus neoformans/gattii NOT DETECTED NOT DETECTED Final   CTX-M ESBL DETECTED (A) NOT DETECTED Final    Comment: CRITICAL RESULT CALLED TO, READ BACK BY AND VERIFIED WITH: PHARMD PRENTICE POISSON [phone] AT 1748 BY EC (NOTE) Extended spectrum beta-lactamase detected. Recommend a carbapenem as initial therapy.      Carbapenem resistance IMP NOT DETECTED NOT DETECTED Final   Carbapenem resistance KPC NOT DETECTED NOT DETECTED Final   Carbapenem resistance NDM NOT DETECTED NOT DETECTED Final   Carbapenem resist OXA 48 LIKE NOT DETECTED NOT DETECTED Final   Carbapenem resistance VIM NOT DETECTED NOT DETECTED Final    Comment: Performed at [facility],  [address], [place], [place] [address]  Respiratory (~20 pathogens) panel by PCR     Status: None   Collection Time: 01/23/24  4:15 AM   Specimen: Nasopharyngeal Swab; Respiratory  Result Value Ref Range Status   Adenovirus NOT DETECTED NOT DETECTED Final   Coronavirus 229E NOT DETECTED NOT DETECTED Final    Comment: (NOTE) The Coronavirus on the Respiratory Panel, DOES NOT test for the novel  Coronavirus (2019 nCoV)    Coronavirus HKU1 NOT DETECTED NOT DETECTED Final   Coronavirus NL63 NOT DETECTED NOT DETECTED Final   Coronavirus OC43 NOT DETECTED NOT DETECTED Final   Metapneumovirus NOT DETECTED NOT DETECTED Final   Rhinovirus / Enterovirus NOT DETECTED NOT DETECTED Final   Influenza A NOT DETECTED NOT DETECTED Final   Influenza B NOT DETECTED NOT DETECTED Final   Parainfluenza Virus 1 NOT DETECTED NOT DETECTED Final   Parainfluenza Virus 2 NOT DETECTED NOT DETECTED Final   Parainfluenza Virus 3 NOT DETECTED NOT DETECTED Final   Parainfluenza Virus 4 NOT DETECTED NOT DETECTED Final   Respiratory Syncytial Virus NOT DETECTED NOT DETECTED Final   Bordetella pertussis NOT DETECTED NOT DETECTED Final   Bordetella Parapertussis NOT DETECTED NOT DETECTED Final   Chlamydophila pneumoniae NOT DETECTED NOT DETECTED Final   Mycoplasma pneumoniae NOT DETECTED NOT DETECTED Final    Comment: Performed at [facility], [address] [place], [place] [address]  Culture, blood (Routine X 2) w Reflex to ID Panel     Status: None (Preliminary result)   Collection Time: 01/23/24  6:50 PM   Specimen: BLOOD RIGHT HAND  Result Value Ref Range Status   Specimen Description BLOOD RIGHT HAND  Final   Special Requests   Final    BOTTLES DRAWN AEROBIC ONLY Blood Culture results may not be optimal due to an inadequate volume of blood received in culture bottles   Culture   Final    NO GROWTH  4 DAYS Performed at [facility], [address] [place], [place] [address]    Report Status  PENDING  Incomplete  Culture, blood (Routine X 2) w Reflex to ID Panel     Status: None (Preliminary result)   Collection Time: 01/23/24  6:55 PM   Specimen: BLOOD LEFT HAND  Result Value Ref Range Status   Specimen Description BLOOD LEFT HAND  Final   Special Requests   Final    BOTTLES DRAWN AEROBIC ONLY Blood Culture results may not be optimal due to an inadequate volume of blood received in culture bottles   Culture   Final    NO GROWTH 4 DAYS Performed at [facility], [address], [place], [place] [address]    Report Status PENDING  Incomplete     Radiology Studies: No results found.   Scheduled Meds:  acidophilus  1 capsule Oral Daily   celecoxib   200 mg Oral BID   enoxaparin  (LOVENOX ) injection  40 mg Subcutaneous Q24H   feeding supplement (GLUCERNA SHAKE)  237 mL Oral TID BM   furosemide   20 mg Oral Daily   Gerhardt's butt cream   Topical BID   leptospermum manuka honey  1 Application Topical Daily   pantoprazole   40 mg Oral BID   potassium chloride   20 mEq Oral Daily   silodosin   8 mg Oral Q breakfast   sodium chloride  flush  10-40 mL Intracatheter Q12H   spironolactone   12.5 mg Oral Daily   Continuous Infusions:  ertapenem  Stopped (01/26/24 1258)     LOS: 4 days     Almarie KANDICE Hoots, MD 01/27/2024, 11:04 AM

## 2024-01-27 NOTE — Progress Notes (Signed)
 Physical Therapy Treatment Patient Details Name: Chad Moreno MRN: [id number] DOB: 10/22/1940 Today's Date: 01/27/2024   History of Present Illness Pt is a 83 y.o. M who presents 01/22/2024 for evaluation of acute onset shortness of breath/respiratory distress. Imaging was concerning for potential patchy groundglass and airspace opacities worrisome for infection versus inflammation, also evidence of fluid overload.  He was placed on antibiotics, diuretics, and admitted to the hospital. Significant PMH: metastatic lung cancer currently on immunotherapy, chronic thrombocytopenia, HTN, HLD, recent bleed in July 2025 for small bowel obstruction, GI bleed from cecal colitis/diverticulosis, ESBL UTI and AKI.    PT Comments  Virtual physical therapy visit completed with paramedic and patient's wife present during session to assist as needed. Reports he didn't feel well yesterday but is feeling better overall today. Pt able to transfer and ambulate in house at a supervision level without assistive device. Demonstrates some minor instability which wife reports appears exacerbated from baseline. Discussed recommendation for use of SPC at this time and he is agreeable. Reviewed HEP verbally, wife reports pt has been consistent and will continue to allow him to use seated bike pedals at a low exertion level. SpO2 99% on RA with gait, BP 118/75 HR 102 in standing (taken by Norman, paramedic during our virtual visit.) All questions answered, pt agreeable to HHPT follow-up once formally discharged from acute admission. Educated on symptom awareness and safety with orthostatic hx. Of note, PT initial order was accidentally d/c and re-ordered; this was a follow-up visit and not a re-evaluation today. Patient will continue to benefit from skilled physical therapy services to further improve independence with functional mobility.    If plan is discharge home, recommend the following: A little help with  bathing/dressing/bathroom;Assistance with cooking/housework;Assist for transportation;Help with stairs or ramp for entrance   Can travel by private vehicle        Equipment Recommendations  None recommended by PT    Recommendations for Other Services       Precautions / Restrictions Precautions Precautions: Fall;Other (comment) Precaution/Restrictions Comments: orthostatic Restrictions Weight Bearing Restrictions Per Provider Order: No Other Position/Activity Restrictions: monitor blood pressure.     Mobility  Bed Mobility               General bed mobility comments: pt sitting on his recliner upon virtual visit.    Transfers Overall transfer level: Needs assistance Equipment used: None Transfers: Sit to/from Stand Sit to Stand: Supervision           General transfer comment: Patient was able to perform sit to stand from his recliner x2 and toilet without assistive device at a supervision level from paramedic [place]. No physical assist required but takes extra time to rise. Standing limited to 2 minutes due to fatigue.    Ambulation/Gait Ambulation/Gait assistance: Supervision Gait Distance (Feet): 30 Feet (x2 estimated) Assistive device: None Gait Pattern/deviations: Step-through pattern, Decreased stride length, Drifts right/left       General Gait Details: Pt noted to have minor sway, decreased step length. Able to ambulate to and from bathroom at a supervision level without overt loss of balance. He fatigues quickly. SpO2 99% on room air. HR max at 104 with short distances taken today.   Stairs             Wheelchair Mobility     Tilt Bed    Modified Rankin (Stroke Patients Only)       Balance Overall balance assessment: Needs assistance Sitting-balance support: Feet supported Sitting  balance-Leahy Scale: Good     Standing balance support: No upper extremity supported, During functional activity Standing balance-Leahy Scale: Fair                               Hotel manager: Impaired Factors Affecting Communication: Hearing impaired  Cognition Arousal: Alert Behavior During Therapy: WFL for tasks assessed/performed   PT - Cognitive impairments: No apparent impairments                       PT - Cognition Comments: Very pleasant and willing.  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 Following commands: Intact      Cueing Cueing Techniques: Verbal cues  Exercises      General Comments General comments (skin integrity, edema, etc.): Spouse reports pt a little more unstable than baseline when walking in the house. I requested pt use his [facility] (with which he is a familiar for outdoor use) in his home for a while to reduce fall risk and conserve energy. Verbally reviewed HEP which pt states he has been consistent with. Wife reports pt normally uses a seated bike pedal for 5-10 minutes at baseline. Instructed this is okay to continue with but to keep exertion level low for now. They are open and agreeable to HHPT follow-up due to slight decline in function since initial evaluation. Standing BP taken by paramedic [place] 118/75 HR 102. Denies dizziness throughout session.      Pertinent Vitals/Pain Pain Assessment Pain Assessment: No/denies pain    Home Living                          Prior Function            PT Goals (current goals can now be found in the care plan section) Acute Rehab PT Goals Patient Stated Goal: Get well, feel strong again PT Goal Formulation: With patient Time For Goal Achievement: 02/06/24 Potential to Achieve Goals: Good Progress towards PT goals: Progressing toward goals    Frequency    Min 1X/week      PT Plan      Co-evaluation              AM-PAC PT 6 Clicks Mobility   Outcome Measure  Help needed turning from your back to your  side while in a flat bed without using bedrails?: None Help needed moving from lying on your back to sitting on the side of a flat bed without using bedrails?: A Little Help needed moving to and from a bed to a chair (including a wheelchair)?: A Little Help needed standing up from a chair using your arms (e.g., wheelchair or bedside chair)?: A Little Help needed to walk in hospital room?: A Little Help needed climbing 3-5 steps with a railing? : A Little 6 Click Score: 19    End of Session Equipment Utilized During Treatment: Gait belt Activity Tolerance: Patient tolerated treatment well Patient left: Other (comment) (Pt in recliner with paramedic and wife presenet at end of session.) Nurse Communication: Mobility status PT Visit Diagnosis: Difficulty in walking, not elsewhere classified (R26.2);Unsteadiness on feet (R26.81)     Time: 8852-8790 PT Time Calculation (min) (ACUTE ONLY): 22 min  Charges:    $Self Care/Home Management: 8-22 PT General Charges $$ ACUTE PT VISIT: 1  Visit                     Leontine Roads, PT, DPT [facility]  Rehabilitation Services Physical Therapist Office: [phone] Website: [email or web address]    Leontine GORMAN Roads 01/27/2024, 1:01 PM

## 2024-01-27 NOTE — Progress Notes (Signed)
 Paramedic Shacola Schussler arrived to the [name] house and found him sitting in the recliner, no obvious distress noted. PT is Aox4. PT stated that he was feeling better than he did yesterday but still just weak. PT denied having any dizziness with ambulation currently. PT had a glucerna earlier in the day but has not had one in the afternoon.   Physical exam showed negative DCPABTLS to the head, neck, or face. Negative JVD or tracheal deviation. Mucosal membranes are pink and wet in appearance. Negative JVD or tracheal deviation noted. Chest expansion is equal with non-labored breathing. PT denied any SOB or difficulty breathing. ABD is soft, non-tender in all quadrants. LBM was 01/27/2024 and was solid in consistency. PT denied any dysuria, hematuria, or polyuria. Negative pedal or peripheral edema. Vital signs were obtained and documented. PICC line was flushed, does not pull back.   IV blood draw was performed in left AC. There were not any night time medications that were due to be administered at the time.   PT and his wife brought up several concerns to paramedic Ardena Gangl.  They are concerned that if the infection doesn't go away with the IV antibiotics what is the next step? They are concerned as the PT had oral abx at [facility] and it did not get rid of the infection.  [name] was inquiring about getting at home O2 for the PT for when he sleeps. She stated that he does snore while he is sleeping and during the program that is when the RN calls to check on his SPO2 dropping low. She stated that she brought this concern up earlier in his stay and had not heard any progress on it.   PT has an oncologist appointment at 1000 in the morning and requested that the paramedic be there when the appointment occurs. PT was reminded to call the virtual RN if they needed anything and that they would call later tonight for his medications.

## 2024-01-27 NOTE — Progress Notes (Signed)
 Communicated with patient via video tablet. Patient currently sitting in the chair. AAOx4. No reports of pain or nausea. Denies SOB at rest. Room air. Pt reports intermittent dizziness. Plan of care reviewed with patient and spouse. Virtual PT assessment planned for this morning.

## 2024-01-27 NOTE — Progress Notes (Signed)
 RN received one current health alarm overnight for a low oxygen reading of 86% around 03:18 am. Phone call made to patient and his wife Barnie stated that he was snoring. Patient did get up to use the bathroom when she went to check on him and oxygen level came up to 95% shortly after that. Bedtime medications were taken last night without issue and patient has denied any pain.

## 2024-01-27 NOTE — TOC CM/SW Note (Signed)
 RNCM awaiting call back from PCP office with follow-up appointment.  Will place on AVS and call pt with details of appointment.  Burlin Mcnair J. Debarah, BSN, RN, [facility]  IP Care Management  Nurse Case Manager  [facility]  Operative Services  [phone]'

## 2024-01-28 ENCOUNTER — Encounter

## 2024-01-28 ENCOUNTER — Other Ambulatory Visit: Payer: Self-pay

## 2024-01-28 ENCOUNTER — Inpatient Hospital Stay: Attending: Internal Medicine | Admitting: Internal Medicine

## 2024-01-28 ENCOUNTER — Inpatient Hospital Stay

## 2024-01-28 DIAGNOSIS — J910 Malignant pleural effusion: Secondary | ICD-10-CM | POA: Insufficient documentation

## 2024-01-28 DIAGNOSIS — D509 Iron deficiency anemia, unspecified: Secondary | ICD-10-CM | POA: Diagnosis not present

## 2024-01-28 DIAGNOSIS — C3492 Malignant neoplasm of unspecified part of left bronchus or lung: Secondary | ICD-10-CM | POA: Diagnosis not present

## 2024-01-28 DIAGNOSIS — R7881 Bacteremia: Secondary | ICD-10-CM | POA: Insufficient documentation

## 2024-01-28 DIAGNOSIS — D6481 Anemia due to antineoplastic chemotherapy: Secondary | ICD-10-CM | POA: Diagnosis not present

## 2024-01-28 DIAGNOSIS — C3431 Malignant neoplasm of lower lobe, right bronchus or lung: Secondary | ICD-10-CM | POA: Diagnosis not present

## 2024-01-28 DIAGNOSIS — T451X5A Adverse effect of antineoplastic and immunosuppressive drugs, initial encounter: Secondary | ICD-10-CM

## 2024-01-28 DIAGNOSIS — Z79899 Other long term (current) drug therapy: Secondary | ICD-10-CM | POA: Insufficient documentation

## 2024-01-28 DIAGNOSIS — D649 Anemia, unspecified: Secondary | ICD-10-CM | POA: Insufficient documentation

## 2024-01-28 LAB — CULTURE, BLOOD (ROUTINE X 2)
Culture: NO GROWTH
Culture: NO GROWTH
Culture: NO GROWTH
Special Requests: ADEQUATE

## 2024-01-28 MED ORDER — FUROSEMIDE 20 MG PO TABS
20.0000 mg | ORAL_TABLET | Freq: Every day | ORAL | Status: DC
  Filled 2024-01-28 (×2): qty 1

## 2024-01-28 MED ORDER — FUROSEMIDE 40 MG PO TABS
40.0000 mg | ORAL_TABLET | Freq: Every day | ORAL | Status: DC
  Filled 2024-01-28: qty 1

## 2024-01-28 MED ORDER — POTASSIUM CHLORIDE CRYS ER 20 MEQ PO TBCR
20.0000 meq | EXTENDED_RELEASE_TABLET | Freq: Once | ORAL | Status: AC
Start: 2024-01-29 — End: 2024-01-29
  Administered 2024-01-29: 20 meq via ORAL
  Filled 2024-01-28: qty 1

## 2024-01-28 NOTE — Progress Notes (Signed)
 Paramedic Ethin Drummond arrived to find the PT lying in a supine position on the couch. PT is lethargic and stated that he wasn't feeling well and just worn out. PT stated that he has also been having diarrhea this afternoon. PT denied being in any pain.   Physical exam showed negative DCAPBTLS to the head, neck, or face. PERRLA. Negative JVD or tracheal deviation. Chest expansion is equal with non-labored breathing. ABD is soft, non-tender upon palpation. PT stated that he is still having intermittent dysuria but denied any hematuria. Negative pedal or peripheral edema noted.   MD Alvia was notified of diarrhea and Imodium  PRN was administered by paramedic [facility]. PT did not want to get up off the couch to ambulate due to feeling weak and stated that he would do it tomorrow.   PT requested that the AM visit occur at 1030 in the morning. PT and wife denied any other questions or concerns.

## 2024-01-28 NOTE — Progress Notes (Signed)
 Paramedic Marika Mahaffy arrived to find the PT sitting upright in his chair, no obvious distress or trauma noted. PT and wife were on the conference call with his oncologist for a regularly scheduled visit. After he was finished with his oncologist IV abx were initiated. PT stated that he did not sleep the best last night as he had to keep getting up to use the restroom. PT stated that he has intermittent dysuria. PT denied being in any pain or dizziness.   Physical exam showed negative DCAPBTLS to the head, neck, or face. PERRLA. Negative JVD or tracheal deviation. Chest expansion is equal with non-labored breathing. ABD is soft, non-tender in all quadrants. [name] urine is yellow and clear in appearance. Negative peripheral or pedal edema.   PT had virtual visit with Dr. Alvia and discussed the concerns noted from 01/27/24. Dr. Alvia discusses an EDD of 01/30/2024. PT and wife felt better after having the conversation with her.   PT stated that he would like to attempt getting up to his room tonight and was discussed that it would be attempted but if he is unable to do so then he would be staying downstairs.   PT and wife denied having any questions at the time and were reminded to call the RN if anything came up.

## 2024-01-28 NOTE — Progress Notes (Signed)
 1049 am virtual visit with provider. Patient pain while urinating is intermittent. Appetite is good per wife. Medics giving 10a meds. Patient had oncology consult. Discussed checking O2Sats while ambulating to check if low to < 88%. Co-signed with medic for patients ascorbic acid , celebrex , and risaquad. Educated on the need to move and instructed how he can move while sitiing in chair as well. Spouse informed that provider contacted PCP and depending on O2Sats during ambulation which medic will complete this afternoon he is amenable to send patient for overnight sleep study once he is discharged. Patient and spouse understood and verbalized back.

## 2024-01-28 NOTE — Progress Notes (Signed)
 0900 on virtual call with patient and wife deborah. Patient denies pain or feeling dizzy, requested he be given 0800 medication with other meds by medic. Has scheduled phone consult with oncologist at 1000. Medics arrived at patients home.

## 2024-01-28 NOTE — Progress Notes (Signed)
 The patient has continued pain in the right paravertebral region corresponding to the 9th right rib where there is metastatic spread of his non small cell carcinoma which also involved the transverse process of the corresponding vertebra.  The patient is not getting significant relief with oral pain medication and we are hoping a regional paravertebral block in this region alleviates his discomfort.  Three levels were performed in order to treat the distribution of innervation.  Technically this was NOT a facet inject, but a paravertebral nerve block.  I would expect the patient to get pain relief if his pain is from nerve root irritation.  If the pain is mor from osseus destruction at this level we may need to consider thermal ablation for greater effect.

## 2024-01-28 NOTE — Plan of Care (Signed)
 Patient alert & oriented. Spouse at bedside. Oncology consult today and virtual visit with provider, medics, and Tax inspector. Took all scheduled medications did not request any PRN medications.    Patient C/O diarrhea this evening was given immodium by medics at 1715. Medics were unable to ambulate and document patients O2 Sats due to patient BM discomfort,  medic aware to be completed in the AM.   Problem: Education: Goal: Knowledge of General Education information will improve Description: Including pain rating scale, medication(s)/side effects and non-pharmacologic comfort measures Outcome: Progressing   Problem: Health Behavior/Discharge Planning: Goal: Ability to manage health-related needs will improve Outcome: Progressing   Problem: Clinical Measurements: Goal: Will remain free from infection Description: IV Abx Outcome: Progressing Goal: Respiratory complications will improve Outcome: Progressing   Problem: Nutrition: Goal: Adequate nutrition will be maintained Outcome: Progressing   Problem: Elimination: Goal: Will not experience complications related to urinary retention Outcome: Progressing   Problem: Pain Managment: Goal: General experience of comfort will improve and/or be controlled Outcome: Progressing   Problem: Skin Integrity: Goal: Risk for impaired skin integrity will decrease Outcome: Progressing   Problem: Activity: Goal: Ability to tolerate increased activity will improve Outcome: Progressing   Problem: Activity: Goal: Capacity to carry out activities will improve Outcome: Progressing

## 2024-01-28 NOTE — Progress Notes (Signed)
 8158 called patient to check on bowel movements. Patient reports as of 1841 has NOT had any additional bouts of diarrhea and requested nursing place evening call at 2100.

## 2024-01-28 NOTE — Progress Notes (Signed)
 [facility] [phone]   [phone]  PROGRESS NOTE FOR TELEMEDICINE VISITS  Chad Santos, MD [address] [place] [address]  I connected withNAME@ on 01/28/24 at 10:00 AM EDT by video enabled telemedicine visit and verified that I am speaking with the correct person using two identifiers.   I discussed the limitations, risks, security and privacy concerns of performing an evaluation and management service by telemedicine and the availability of in-person appointments. I also discussed with the patient that there may be a patient responsible charge related to this service. The patient expressed understanding and agreed to proceed.  Other persons participating in the visit and their role in the encounter:  wife  Patient's location:  Home Provider's location: [facility]  DIAGNOSIS:  Recurrent/metastatic (T1c, N3, M1b) non-small cell lung cancer, adenocarcinoma initially diagnosed as early stage disease, stage I in March 2024 involving the right lower lobe and left upper lobe status post SBRT completed in May 2024.  The patient presented with recurrent and metastatic disease to bilateral supraclavicular, subpectoral, left axillary as well as upper abdominal lymphadenopathy and malignant left pleural effusion in December 2024 with confirmation of malignancy from the pleural effusion.    Biomarker Findings HRD signature - Cannot Be Determined Microsatellite status - Cannot Be Determined ? Tumor Mutational Burden - Cannot Be Determined Genomic Findings For a complete list of the genes assayed, please refer to the Appendix. BRAF V600E CHEK2 R474C CTNNB1 S45C DNMT3A W330* PPARG E3K 7 Disease relevant genes with no reportable alterations: ALK, EGFR, ERBB2, KRAS, MET, RET, ROS1   PDL1 TPS 1%   PRIOR THERAPY: None   CURRENT THERAPY:  Encorafenib  450 mg p.o. daily and binimetinib  45 mg p.o. twice daily.  First dose July 22, 2023.  Starting January 30, 2024 his treatment would be encorafenib  150 mg p.o. daily and binimetinib  45 Mg p.o. once a day.  The dose reduction was secondary to intolerance with persistent diarrhea.  INTERVAL HISTORY: Chad Moreno 83 y.o. male has a video virtual visit with me today.  He was accompanied by his wife.Discussed the use of AI scribe software for clinical note transcription with the patient, who gave verbal consent to proceed.  History of Present Illness Chad Moreno is an 83 year old male with metastatic non-small cell lung cancer who presents for evaluation and discussion of his condition.  He was diagnosed with metastatic non-small cell lung cancer, adenocarcinoma, in May 2024. Since July 22, 2023, he had been on encorafenib  450 mg daily and binimetinib  45 mg twice a day. However, he experienced severe side effects, including uncontrollable diarrhea and dehydration, leading to hospitalization. Consequently, he discontinued these medications on December 29, 2023, and has been off them for almost four weeks.  Following the discontinuation of the cancer medications, he was hospitalized for an iron  infusion and experienced a breathing issue. No further episodes of diarrhea, nausea, or vomiting have occurred since stopping the medications.  He is currently receiving IV antibiotics for bacteremia and is taking Invanz , with the last day of treatment scheduled for January 29, 2024. He has a history of congestive heart failure and anemia, which continue to be issues for him, but he has not experienced any further gastrointestinal symptoms since stopping the cancer medications.  No further diarrhea, nausea, or vomiting since stopping the medications. He has ongoing issues with congestive heart failure and anemia.    MEDICAL HISTORY: Past Medical History:  Diagnosis Date  Age-related cataract of left eye    Amblyopia, right eye    Arthritis    Cancer (HCC)    Dysuria    ED  (erectile dysfunction)    Elevated PSA    Epidermal cyst    Family history of prostate cancer    Gout    Hip pain    Histoplasmosis    Hyperlipidemia    Hypermetropia, left eye    Hypertension    Impaired glucose tolerance    Joint pain    Left shoulder pain    Lower back pain    Melanocytic nevi, unspecified    Nummular dermatitis    Ocular hypertension    Palmar fascial fibromatosis    Prostate cancer screening    Regular astigmatism, right eye    Senile nuclear sclerosis     ALLERGIES:  is allergic to sulfa antibiotics.  MEDICATIONS:  No current facility-administered medications for this visit.   No current outpatient medications on file.   Facility-Administered Medications Ordered in Other Visits  Medication Dose Route Frequency Provider Last Rate Last Admin   acetaminophen  (TYLENOL ) tablet 650 mg  650 mg Oral Q6H PRN Amponsah, Prosper M, MD       acidophilus (RISAQUAD) capsule 1 capsule  1 capsule Oral Daily Will Almarie MATSU, MD   1 capsule at 01/27/24 1136   ascorbic acid  (VITAMIN C ) tablet 500 mg  500 mg Oral Daily Will Almarie MATSU, MD       celecoxib  (CELEBREX ) capsule 200 mg  200 mg Oral BID Mathews, Elizabeth G, MD   200 mg at 01/27/24 2101   enoxaparin  (LOVENOX ) injection 40 mg  40 mg Subcutaneous Q24H Amponsah, Prosper M, MD   40 mg at 01/27/24 1212   ertapenem  (INVANZ ) 1 g in sodium chloride  0.9 % 100 mL IVPB  1 g Intravenous Q24H Amponsah, Prosper M, MD   Stopped at 01/27/24 1139   feeding supplement (GLUCERNA SHAKE) (GLUCERNA SHAKE) liquid 237 mL  237 mL Oral TID BM Chavez, Abigail, NP   237 mL at 01/27/24 2050   ferrous sulfate  EC tablet 325 mg  325 mg Oral QODAY Mathews, Elizabeth G, MD   325 mg at 01/27/24 2052   furosemide  (LASIX ) tablet 20 mg  20 mg Oral Daily Mathews, Elizabeth G, MD   20 mg at 01/27/24 1130   Gerhardt's butt cream   Topical BID Lou Claretta HERO, MD   Given by Other at 01/27/24 1213   ipratropium-albuterol  (DUONEB) 0.5-2.5 (3)  MG/3ML nebulizer solution 3 mL  3 mL Nebulization Q6H PRN Amponsah, Prosper M, MD       leptospermum manuka honey (MEDIHONEY) paste 1 Application  1 Application Topical Daily Will Almarie MATSU, MD   1 Application at 01/27/24 1212   loperamide  (IMODIUM ) capsule 2 mg  2 mg Oral PRN Amponsah, Prosper M, MD   2 mg at 01/24/24 1815   ondansetron  (ZOFRAN ) tablet 4 mg  4 mg Oral Q6H PRN Amponsah, Prosper M, MD   4 mg at 01/26/24 1055   pantoprazole  (PROTONIX ) EC tablet 40 mg  40 mg Oral BID Mathews, Elizabeth G, MD   40 mg at 01/27/24 2101   senna-docusate (Senokot-S) tablet 1 tablet  1 tablet Oral QHS PRN Lou Claretta HERO, MD       silodosin  (RAPAFLO ) capsule 8 mg  8 mg Oral Q breakfast Will Almarie MATSU, MD   8 mg at 01/27/24 1136   sodium chloride  flush (NS) 0.9 % injection 10-40  mL  10-40 mL Intracatheter Q12H Lou Claretta HERO, MD   10 mL at 01/27/24 1140   sodium chloride  flush (NS) 0.9 % injection 10-40 mL  10-40 mL Intracatheter PRN Lou Claretta HERO, MD   10 mL at 01/24/24 2028   spironolactone  (ALDACTONE ) tablet 12.5 mg  12.5 mg Oral Daily Will Almarie MATSU, MD   12.5 mg at 01/27/24 1139    SURGICAL HISTORY:  Past Surgical History:  Procedure Laterality Date   BRONCHIAL BIOPSY  08/27/2022   Procedure: BRONCHIAL BIOPSIES;  Surgeon: Shelah Lamar RAMAN, MD;  Location[facility] ENDOSCOPY;  Service: Pulmonary;;   BRONCHIAL BRUSHINGS  08/27/2022   Procedure: BRONCHIAL BRUSHINGS;  Surgeon: Shelah Lamar RAMAN, MD;  Location: [facility] ENDOSCOPY;  Service: Pulmonary;;   BRONCHIAL NEEDLE ASPIRATION BIOPSY  08/27/2022   Procedure: BRONCHIAL NEEDLE ASPIRATION BIOPSIES;  Surgeon: Shelah Lamar RAMAN, MD;  Location: [facility] ENDOSCOPY;  Service: Pulmonary;;   CONSTRICTING FINGER RING EXCISION Left    CYST REMOVAL TRUNK Left    left posterior shoulder   FIDUCIAL MARKER PLACEMENT  08/27/2022   Procedure: FIDUCIAL MARKER PLACEMENT;  Surgeon: Shelah Lamar RAMAN, MD;  Location: [facility] ENDOSCOPY;  Service: Pulmonary;;   GOLD SEED  IMPLANT N/A 10/02/2022   Procedure: GOLD SEED IMPLANT;  Surgeon: Lovie Arlyss CROME, MD;  Location: [facility];  Service: Urology;  Laterality: N/A;   index finger Left    IR FACET JT INJ C/T  2ND LEVEL RIGHT W/FL/CT  01/20/2024   IR FACET JT INJ C/T  SINGLE LEVEL RIGHT W/FL/CT  01/20/2024   IR FACET JT INJ C/T 3RD PLUS LEVEL RIGHT W/FL/CT  01/20/2024   IR THORACENTESIS ASP PLEURAL SPACE W/IMG GUIDE  06/14/2023   Prostate needle biopsy     SPACE OAR INSTILLATION N/A 10/02/2022   Procedure: SPACE OAR INSTILLATION;  Surgeon: Lovie Arlyss CROME, MD;  Location: [facility];  Service: Urology;  Laterality: N/A;    REVIEW OF SYSTEMS:  Constitutional: positive for fatigue Eyes: negative Ears, nose, mouth, throat, and face: negative Respiratory: positive for dyspnea on exertion Cardiovascular: negative Gastrointestinal: positive for diarrhea Genitourinary:negative Integument/breast: negative Hematologic/lymphatic: negative Musculoskeletal:negative Neurological: negative Behavioral/Psych: negative Endocrine: negative Allergic/Immunologic: negative    LABORATORY DATA: Lab Results  Component Value Date   WBC 4.1 01/27/2024   HGB 11.1 (L) 01/27/2024   HCT 34.4 (L) 01/27/2024   MCV 89.1 01/27/2024   PLT 142 (L) 01/27/2024      Chemistry      Component Value Date/Time   NA 137 01/27/2024 1249   K 4.5 01/27/2024 1249   CL 106 01/27/2024 1249   CO2 22 01/27/2024 1249   BUN 20 01/27/2024 1249   CREATININE 0.86 01/27/2024 1249   CREATININE 1.08 12/18/2023 1100      Component Value Date/Time   CALCIUM 8.3 (L) 01/27/2024 1249   ALKPHOS 59 01/27/2024 1249   AST 22 01/27/2024 1249   AST 15 12/18/2023 1100   ALT 23 01/27/2024 1249   ALT 12 12/18/2023 1100   BILITOT 0.8 01/27/2024 1249   BILITOT 0.3 12/18/2023 1100       RADIOGRAPHIC STUDIES: US  EKG SITE RITE Result Date: 01/24/2024 If Site Rite image not attached, placement could not be confirmed due to  current cardiac rhythm.  ECHOCARDIOGRAM COMPLETE Result Date: 01/24/2024    ECHOCARDIOGRAM REPORT   Patient Name:   Chad Moreno Date of Exam: 01/24/2024 Medical Rec #:  [id number]       Height:  73.0 in Accession #:    [id number]      Weight:       192.7 lb Date of Birth:  [birth year]       BSA:          2.118 m Patient Age:    82 years        BP:           131/78 mmHg Patient Gender: M               HR:           74 bpm. Exam Location:  Inpatient Procedure: 2D Echo, Cardiac Doppler, Color Doppler and Intracardiac            Opacification Agent (Both Spectral and Color Flow Doppler were            utilized during procedure). Indications:    CHF  History:        Patient has prior history of Echocardiogram examinations, most                 recent 07/19/2023. Risk Factors:Hypertension.  Sonographer:    Philomena Daring Referring Phys: [id number] VASUNDHRA RATHORE IMPRESSIONS  1. Left ventricular ejection fraction, by estimation, is 30 to 35%. The left ventricle has moderately decreased function. The left ventricle demonstrates regional wall motion abnormalities (see scoring diagram/findings for description). There is mild left ventricular hypertrophy. Left ventricular diastolic parameters are indeterminate.  2. Right ventricular systolic function is mildly reduced. The right ventricular size is normal. Normal RV basal function with apical hypokinesis  3. Moderate pleural effusion in the left lateral region.  4. The mitral valve was not well visualized. Trivial mitral valve regurgitation.  5. The aortic valve was not well visualized. Aortic valve regurgitation is not visualized. No aortic stenosis is present.  6. The inferior vena cava is normal in size with greater than 50% respiratory variability, suggesting right atrial pressure of 3 mmHg. Conclusion(s)/Recommendation(s): Normal LV basal function with apical akinesis. Could represent Takotsubo cardiomyopathy vs LAD territory infarct. FINDINGS  Left Ventricle: Left  ventricular ejection fraction, by estimation, is 30 to 35%. The left ventricle has moderately decreased function. The left ventricle demonstrates regional wall motion abnormalities. Definity  contrast agent was given IV to delineate the left ventricular endocardial borders. The left ventricular internal cavity size was normal in size. There is mild left ventricular hypertrophy. Left ventricular diastolic parameters are indeterminate.  LV Wall Scoring: The entire apex is akinetic. The mid anteroseptal segment, mid inferolateral segment, mid anterolateral segment, mid inferoseptal segment, mid anterior segment, and mid inferior segment are hypokinetic. The basal anteroseptal segment, basal inferolateral segment, basal anterolateral segment, basal anterior segment, basal inferior segment, and basal inferoseptal segment are normal. Right Ventricle: The right ventricular size is normal. No increase in right ventricular wall thickness. Right ventricular systolic function is mildly reduced. Left Atrium: Left atrial size was normal in size. Right Atrium: Right atrial size was normal in size. Pericardium: There is no evidence of pericardial effusion. Mitral Valve: The mitral valve was not well visualized. Trivial mitral valve regurgitation. Tricuspid Valve: The tricuspid valve is normal in structure. Tricuspid valve regurgitation is trivial. Aortic Valve: The aortic valve was not well visualized. Aortic valve regurgitation is not visualized. No aortic stenosis is present. Pulmonic Valve: The pulmonic valve was not well visualized. Pulmonic valve regurgitation is not visualized. Aorta: The aortic root is normal in size and structure. Venous: The inferior vena cava is normal in size with  greater than 50% respiratory variability, suggesting right atrial pressure of 3 mmHg. IAS/Shunts: The interatrial septum was not well visualized. Additional Comments: There is a moderate pleural effusion in the left lateral region.  LEFT  VENTRICLE PLAX 2D LVIDd:         4.90 cm LVIDs:         3.90 cm LV PW:         1.00 cm LV IVS:        1.10 cm LVOT diam:     2.00 cm LV SV:         45 LV SV Index:   21 LVOT Area:     3.14 cm  LV Volumes (MOD) LV vol d, MOD A2C: 84.1 ml LV vol d, MOD A4C: 147.0 ml LV vol s, MOD A2C: 43.5 ml LV vol s, MOD A4C: 73.9 ml LV SV MOD A2C:     40.6 ml LV SV MOD A4C:     147.0 ml LV SV MOD BP:      59.8 ml RIGHT VENTRICLE             IVC RV S prime:     10.90 cm/s  IVC diam: 1.30 cm TAPSE (M-mode): 2.1 cm LEFT ATRIUM           Index        RIGHT ATRIUM           Index LA diam:      3.20 cm 1.51 cm/m   RA Area:     13.50 cm LA Vol (A4C): 35.5 ml 16.76 ml/m  RA Volume:   28.00 ml  13.22 ml/m  AORTIC VALVE LVOT Vmax:   85.40 cm/s LVOT Vmean:  54.000 cm/s LVOT VTI:    0.144 m  AORTA Ao Root diam: 3.10 cm  SHUNTS Systemic VTI:  0.14 m Systemic Diam: 2.00 cm Chad Nanas MD Electronically signed by Chad Nanas MD Signature Date/Time: 01/24/2024/12:28:37 PM    Final    DG Chest 2 View Result Date: 01/24/2024 CLINICAL DATA:  [id number] CAP (community acquired pneumonia) [id number] EXAM: CHEST - 2 VIEW COMPARISON:  January 22, 2024 FINDINGS: Similar appearance of the left mid lung nodular opacity and masslike consolidation of the right perihilar region with fiducial markers present. Significantly improved aeration of the lungs. Small layering bilateral pleural effusions with persistent left basilar consolidation. No pneumothorax. Mild cardiomegaly. Tortuous aorta with aortic atherosclerosis. No acute fracture or destructive lesions. Multilevel thoracic osteophytosis. IMPRESSION: Significantly improved aeration of the lungs with decreasing pleural effusions, small on the left and trace on the right. Persistent retrocardiac airspace consolidation again noted. Electronically Signed   By: Rogelia Myers M.D.   On: 01/24/2024 12:06   CT Angio Chest PE W and/or Wo Contrast Result Date: 01/22/2024 CLINICAL DATA:  High  probability for PE. History of non-small cell lung cancer. EXAM: CT ANGIOGRAPHY CHEST WITH CONTRAST TECHNIQUE: Multidetector CT imaging of the chest was performed using the standard protocol during bolus administration of intravenous contrast. Multiplanar CT image reconstructions and MIPs were obtained to evaluate the vascular anatomy. RADIATION DOSE REDUCTION: This exam was performed according to the departmental dose-optimization program which includes automated exposure control, adjustment of the mA and/or kV according to patient size and/or use of iterative reconstruction technique. CONTRAST:  75mL OMNIPAQUE  IOHEXOL  350 MG/ML SOLN COMPARISON:  CT angiogram chest 12/30/2023. FINDINGS: Cardiovascular: Satisfactory opacification of the pulmonary arteries to the segmental level. No evidence of pulmonary embolism. Normal heart size. No pericardial  effusion. There are atherosclerotic calcifications of the aorta. Mediastinum/Nodes: No enlarged mediastinal, hilar, or axillary lymph nodes. Thyroid  gland, trachea, and esophagus demonstrate no significant findings. Lungs/Pleura: Small bilateral pleural effusions, left greater than right, has increased from prior. There is atelectasis in the bilateral lower lobes. There is central peribronchial wall thickening diffusely. Additional patchy ground-glass and airspace opacities are seen centrally in the right upper lobe and left upper lobe, new from prior. Focal masslike densities in the left upper lobe and right lower lobe with fiduciary markers appear grossly unchanged. There is no evidence for pneumothorax. Trachea and central airways are patent. Upper Abdomen: No acute abnormality. Musculoskeletal: No chest wall abnormality. No acute or significant osseous findings. Review of the MIP images confirms the above findings. IMPRESSION: 1. No evidence for pulmonary embolism. 2. Small bilateral pleural effusions, left greater than right, have increased from prior. 3. Patchy  ground-glass and airspace opacities centrally in the right upper lobe and left upper lobe, new from prior, worrisome for infection or inflammation. 4. Central peribronchial wall thickening diffusely. 5. Stable masslike densities in the left upper lobe and right lower lobe with fiduciary markers. 6. Aortic atherosclerosis. Aortic Atherosclerosis (ICD10-I70.0). Electronically Signed   By: Greig Pique M.D.   On: 01/22/2024 23:19   DG Chest Portable 1 View Result Date: 01/22/2024 CLINICAL DATA:  Dyspnea. EXAM: PORTABLE CHEST 1 VIEW COMPARISON:  01/17/2024 FINDINGS: Cardiac enlargement. Increasing coarse perihilar infiltrates in the lungs since prior study. This may represent developing edema or pneumonia. The known left upper lobe mass is obscured by the parenchymal process. No pleural effusion. No pneumothorax. Surgical clips projected over the hila. Calcification of the aorta. Degenerative changes in the spine and shoulders. IMPRESSION: Developing coarse perihilar infiltrates may indicate pneumonia or edema. Electronically Signed   By: Elsie Gravely M.D.   On: 01/22/2024 21:33   IR Facet JT Inj C/T  Single Level Right W/FL/CT Result Date: 01/21/2024 CLINICAL DATA:  Patient with metastatic non small cell lung cancer with focal pain at the T9 costovertebral junction with metastatic disease involving the T9 vertebrae and associated transverse process. Planned perivertebral block on the right to involve the T8, T9, and T10 ANESTHESIA/SEDATION: Moderate (conscious) sedation was employed during this procedure. A total of Versed  2 mg and Fentanyl  150 mcg was administered intravenously by the radiology nurse. Total intra-service moderate Sedation Time: 27 minutes. The patient's level of consciousness and vital signs were monitored continuously by radiology nursing throughout the procedure under my direct supervision. EXAM: Paravertebral block 3 levels right-sided With the patient in a prone position a a posterior  approach was taken to the paravertebral space at 3 separate levels on the patient. Initially fluoroscopy was used to identify the T9 sclerotic rib and marked the ribs and transverse processes associated with T8, T9, and T10 on the right side. After the patient's skin was marked for the appropriate levels, ultrasound was used to evaluate the paravertebral space. The ultrasound probe was then oriented to place the transverse process and the associated rib in in orientation to highlight the superior costo transverse ligament and internal intercostal membrane. Beginning at the T8 level, all 3 levels were anesthetized with 1% lidocaine  by advancing the needle in the expected trajectory under ultrasound guidance. After infiltrating the regions with 1% lidocaine  a mixture of bupivacaine  50% and triamcinolone  1 milligram/milliliter (total 40 mg) was mixed at the table and separated into 3 syringes. Each syringe was then used to infiltrate the paravertebral space by advancing the  needle from the skin through the superior costo transverse ligament to penetrate the region of interest. Once the needle identified in the correct space, each space was aspirated and when no blood was returned, the mixture was then infiltrated into the paravertebral space and watching as the parietal pleura was deflected down words demonstrating satisfactory position. After all 3 levels (T8-T9 and T10 were infiltrated) were satisfactory filled with bupivacaine  and triamcinolone  all needles were removed and sterile dressing was applied. FLUOROSCOPY: Radiation Exposure Index (as provided by the fluoroscopic device): 0.3 mGy Kerma IMPRESSION: Successful right-sided paravertebral block for T8, T9, and T10. The patient will be followed clinically. Electronically Signed   By: Cordella Banner   On: 01/21/2024 08:46   IR Facet JT Inj C/T  2nd Level Right W/FL/CT Result Date: 01/21/2024 CLINICAL DATA:  Patient with metastatic non small cell lung cancer  with focal pain at the T9 costovertebral junction with metastatic disease involving the T9 vertebrae and associated transverse process. Planned perivertebral block on the right to involve the T8, T9, and T10 ANESTHESIA/SEDATION: Moderate (conscious) sedation was employed during this procedure. A total of Versed  2 mg and Fentanyl  150 mcg was administered intravenously by the radiology nurse. Total intra-service moderate Sedation Time: 27 minutes. The patient's level of consciousness and vital signs were monitored continuously by radiology nursing throughout the procedure under my direct supervision. EXAM: Paravertebral block 3 levels right-sided With the patient in a prone position a a posterior approach was taken to the paravertebral space at 3 separate levels on the patient. Initially fluoroscopy was used to identify the T9 sclerotic rib and marked the ribs and transverse processes associated with T8, T9, and T10 on the right side. After the patient's skin was marked for the appropriate levels, ultrasound was used to evaluate the paravertebral space. The ultrasound probe was then oriented to place the transverse process and the associated rib in in orientation to highlight the superior costo transverse ligament and internal intercostal membrane. Beginning at the T8 level, all 3 levels were anesthetized with 1% lidocaine  by advancing the needle in the expected trajectory under ultrasound guidance. After infiltrating the regions with 1% lidocaine  a mixture of bupivacaine  50% and triamcinolone  1 milligram/milliliter (total 40 mg) was mixed at the table and separated into 3 syringes. Each syringe was then used to infiltrate the paravertebral space by advancing the needle from the skin through the superior costo transverse ligament to penetrate the region of interest. Once the needle identified in the correct space, each space was aspirated and when no blood was returned, the mixture was then infiltrated into the  paravertebral space and watching as the parietal pleura was deflected down words demonstrating satisfactory position. After all 3 levels (T8-T9 and T10 were infiltrated) were satisfactory filled with bupivacaine  and triamcinolone  all needles were removed and sterile dressing was applied. FLUOROSCOPY: Radiation Exposure Index (as provided by the fluoroscopic device): 0.3 mGy Kerma IMPRESSION: Successful right-sided paravertebral block for T8, T9, and T10. The patient will be followed clinically. Electronically Signed   By: Cordella Banner   On: 01/21/2024 08:46   IR Facet JT Inj C/T 3rd Plus Level Right W/Fl/CT Result Date: 01/21/2024 CLINICAL DATA:  Patient with metastatic non small cell lung cancer with focal pain at the T9 costovertebral junction with metastatic disease involving the T9 vertebrae and associated transverse process. Planned perivertebral block on the right to involve the T8, T9, and T10 ANESTHESIA/SEDATION: Moderate (conscious) sedation was employed during this procedure. A total of  Versed  2 mg and Fentanyl  150 mcg was administered intravenously by the radiology nurse. Total intra-service moderate Sedation Time: 27 minutes. The patient's level of consciousness and vital signs were monitored continuously by radiology nursing throughout the procedure under my direct supervision. EXAM: Paravertebral block 3 levels right-sided With the patient in a prone position a a posterior approach was taken to the paravertebral space at 3 separate levels on the patient. Initially fluoroscopy was used to identify the T9 sclerotic rib and marked the ribs and transverse processes associated with T8, T9, and T10 on the right side. After the patient's skin was marked for the appropriate levels, ultrasound was used to evaluate the paravertebral space. The ultrasound probe was then oriented to place the transverse process and the associated rib in in orientation to highlight the superior costo transverse ligament and  internal intercostal membrane. Beginning at the T8 level, all 3 levels were anesthetized with 1% lidocaine  by advancing the needle in the expected trajectory under ultrasound guidance. After infiltrating the regions with 1% lidocaine  a mixture of bupivacaine  50% and triamcinolone  1 milligram/milliliter (total 40 mg) was mixed at the table and separated into 3 syringes. Each syringe was then used to infiltrate the paravertebral space by advancing the needle from the skin through the superior costo transverse ligament to penetrate the region of interest. Once the needle identified in the correct space, each space was aspirated and when no blood was returned, the mixture was then infiltrated into the paravertebral space and watching as the parietal pleura was deflected down words demonstrating satisfactory position. After all 3 levels (T8-T9 and T10 were infiltrated) were satisfactory filled with bupivacaine  and triamcinolone  all needles were removed and sterile dressing was applied. FLUOROSCOPY: Radiation Exposure Index (as provided by the fluoroscopic device): 0.3 mGy Kerma IMPRESSION: Successful right-sided paravertebral block for T8, T9, and T10. The patient will be followed clinically. Electronically Signed   By: Cordella Banner   On: 01/21/2024 08:46   CT ABDOMEN PELVIS W CONTRAST Result Date: 01/17/2024 CLINICAL DATA:  Diarrhea and weakness, history of lung cancer EXAM: CT ABDOMEN AND PELVIS WITH CONTRAST TECHNIQUE: Multidetector CT imaging of the abdomen and pelvis was performed using the standard protocol following bolus administration of intravenous contrast. RADIATION DOSE REDUCTION: This exam was performed according to the departmental dose-optimization program which includes automated exposure control, adjustment of the mA and/or kV according to patient size and/or use of iterative reconstruction technique. CONTRAST:  OMNIPAQUE  IOHEXOL  300 MG/ML  SOLN COMPARISON:  CT abdomen pelvis January 03, 2024  FINDINGS: Lower chest: Stable small right and moderate left pleural effusions. Subjacent atelectasis of bilateral lung base. The heart size is normal. Atherosclerotic calcifications of coronary arteries partially visualized. Trace pericardial fluid. Hepatobiliary: No focal hepatic lesions. No intra or extrahepatic biliary ductal dilation. Query gallbladder fundal adenomyomatosis. interval resolution of layering hyperattenuation within the gallbladder. No focal lesions or main ductal dilation. Spleen: Normal in size without focal abnormality. Adrenals/Urinary Tract: Nodular thickening of the left adrenal gland similar to prior. No right adrenal nodule. No suspicious renal mass, calculi or hydronephrosis. Mild circumferential thickening of the urinary bladder. Right kidney cortical cysts which does not require imaging follow-up. Left kidney lower pole punctate nonobstructive nephrolithiasis. Stomach/Bowel: Interval removal of the enteric tube. Normal appearance of the stomach. Small duodenal diverticulum D3. Colonic diverticulosis. Peritoneal fat stranding in midline pelvic and left lower quadrant, similar to prior. Trace pelvic fluid. Of normal appendix. No abnormal bowel wall thickening or obstruction. No pericolonic fluid  collection along the ascending colon. Vascular/Lymphatic: Aortic atherosclerosis. No enlarged abdominal or pelvic lymph nodes. Reproductive: Prostatomegaly.  Fiducial markers in place. Other: Mild peritoneal fat stranding and trace ascites, similar to prior. Small fat-containing left inguinal hernia. Small fat containing umbilical hernia Musculoskeletal: Multilevel degenerative changes of the spine. Osseous sclerotic changes involving the left sacrum and adjacent iliac bone, T9 transverse process and right posterior ninth rib. IMPRESSION: 1. No suspicious bowel lesion or obstruction. Compared resolution of wall thickening and pericolonic fat stranding at the cecal region. Colonic diverticulosis.  Persistent peritoneal fat stranding and trace free fluid predominantly in left pelvic cavity, similar to prior. 2.  Stable sclerotic osseous metastasis.  No new osseous lesion. 4.  Stable bilateral pleural effusion and subjacent atelectasis . 5.Nodular thickening of left adrenal gland . Viable or treated metastasis cannot be excluded in this patient with known malignancy. 5. Aortic Atherosclerosis (ICD10-I70.0). Electronically Signed   By: Megan  Zare M.D.   On: 01/17/2024 11:49   DG Chest 1 View Result Date: 01/17/2024 CLINICAL DATA:  Cough.  Known metastatic lung cancer EXAM: CHEST  1 VIEW COMPARISON:  01/04/2024 FINDINGS: Numerous leads and wires project over the chest. Midline trachea. Mild cardiomegaly. Atherosclerosis in the transverse aorta. No pleural effusion or pneumothorax. Mild right hemidiaphragm elevation. Inferior left upper lobe lung mass is estimated at 3.4 cm. Left lower lobe airspace disease medially. IMPRESSION: Medial left lower lobe airspace disease which could represent atelectasis or infection. Left upper lobe lung mass, as before. Aortic Atherosclerosis (ICD10-I70.0). Electronically Signed   By: Rockey Kilts M.D.   On: 01/17/2024 10:47   DG CHEST PORT 1 VIEW Result Date: 01/04/2024 CLINICAL DATA:  [id number] Hypoxia [id number] EXAM: PORTABLE CHEST 1 VIEW COMPARISON:  December 30, 2023 FINDINGS: The cardiomediastinal silhouette is unchanged in contour. Similar irregular RIGHT perihilar contour compared to prior with associated biopsy clip. Similar biopsy clip in association with a LEFT superior lung mass. The enteric tube courses through the chest to the abdomen beyond the field-of-view. Atherosclerotic calcifications of the aorta. No pleural effusion. No pneumothorax. No acute pleuroparenchymal abnormality. Lytic lesion of the LEFT posterolateral third rib. IMPRESSION: 1. No acute cardiopulmonary abnormality. 2. Similar appearance of known pulmonary malignancy including a metastatic lesion of the  LEFT posterior third rib. Electronically Signed   By: Corean Salter M.D.   On: 01/04/2024 17:53   CT ABDOMEN PELVIS W CONTRAST Result Date: 01/03/2024 CLINICAL DATA:  Follow-up colitis and bowel obstruction. Abdominal pain. * Tracking Code: BO * EXAM: CT ABDOMEN AND PELVIS WITH CONTRAST TECHNIQUE: Multidetector CT imaging of the abdomen and pelvis was performed using the standard protocol following bolus administration of intravenous contrast. RADIATION DOSE REDUCTION: This exam was performed according to the departmental dose-optimization program which includes automated exposure control, adjustment of the mA and/or kV according to patient size and/or use of iterative reconstruction technique. CONTRAST:  OMNIPAQUE  IOHEXOL  300 MG/ML  SOLN COMPARISON:  CT abdomen and pelvis dated 12/30/2023 FINDINGS: Lower chest: Post treatment changes of the left upper and right lower lobe. Trace right and moderate left pleural effusions. Partially imaged heart size is normal. Coronary artery calcifications. Hepatobiliary: No focal hepatic lesions. No intra or extrahepatic biliary ductal dilation. Gallbladder fundal adenomyomatosis. Layering hyperattenuation within the gallbladder may represent vicariously excreted contrast. Pancreas: No focal lesions or main ductal dilation. Spleen: Normal in size without focal abnormality. Adrenals/Urinary Tract: Nodular thickening of the left adrenal gland without discrete nodule. No right adrenal nodule. No suspicious renal mass, calculi  or hydronephrosis. Mild circumferential thickening of the urinary bladder. Stomach/Bowel: Enteric tube terminates in the gastric body. Normal appearance of the stomach. Small duodenal diverticulum arising from the third portion of the duodenum. Enteric contrast material reaches the rectum. Colonic diverticulosis without acute diverticulitis. Cecum is well distended without mural thickening. Normal appendix. Vascular/Lymphatic: Aortic  atherosclerosis. No enlarged abdominal or pelvic lymph nodes. Reproductive: Mildly enlarged prostate gland with fiducials in-situ. Other: Small volume free fluid. No free air or fluid collection. Small fat-containing left inguinal hernia. Musculoskeletal: Unchanged sclerosis of the left sacroiliac joint, right T9 transverse process, and right posterior ninth rib. Multilevel degenerative changes of the partially imaged thoracic and lumbar spine. Mild body wall edema. IMPRESSION: 1. No evidence of bowel obstruction. Enteric contrast material reaches the rectum. 2. Mild circumferential thickening of the urinary bladder, which may be related to underdistention or cystitis. Recommend correlation with urinalysis. 3. Unchanged sclerosis of the left sacroiliac joint, right T9 transverse process, and right posterior ninth rib, which may represent treated osseous metastases. 4. Trace right and moderate left pleural effusions. 5. Aortic Atherosclerosis (ICD10-I70.0). Electronically Signed   By: Limin  Xu M.D.   On: 01/03/2024 15:52   DG Abd Portable 1V Result Date: 01/03/2024 CLINICAL DATA:  Abdominal bloating cramping EXAM: PORTABLE ABDOMEN - 1 VIEW COMPARISON:  Abdominal radiograph dated 01/01/2024 FINDINGS: Gastric/enteric tube tip projects over the stomach. Bowel gas is seen to the level rectum. Diffuse gas-filled dilation of the colon. No free air or pneumatosis. No abnormal radio-opaque calculi or mass effect. No acute or substantial osseous abnormality. The sacrum and coccyx are partially obscured by overlying bowel contents. Prostate fiducials project over the midline lower pelvis. IMPRESSION: 1. Diffuse gas-filled dilation of the colon, which may represent ileus. 2. Gastric/enteric tube tip projects over the stomach. Electronically Signed   By: Limin  Xu M.D.   On: 01/03/2024 12:03   DG Abd Portable 1V Result Date: 01/01/2024 CLINICAL DATA:  Of the if present present EXAM: PORTABLE ABDOMEN - 1 VIEW COMPARISON:   Portable abdomen film yesterday at 4:02 p.m. FINDINGS: 4:15 a.m. NGT curves left in the stomach with the tip in the proximal fundus. Contrast is progressed into the colon and seen as far as the distal descending segment. There is still small-bowel dilatation in the right mid abdomen up to 5 cm. No other dilated small bowel is seen with the prior study having demonstrated small-bowel dilatation throughout the upper to mid abdomen. There is no supine evidence of free air or other significant findings. In all other respects no further changes. IMPRESSION: 1. Contrast is progressed into the normal caliber colon and seen as far as the distal descending segment. 2. There is still small-bowel dilatation in the right mid abdomen up to 5 cm. No other dilated small bowel is seen with the prior study having demonstrated small-bowel dilatation throughout the upper to mid abdomen. 3. NGT curves to the left in the stomach with the tip in the proximal fundus. Electronically Signed   By: Francis Quam M.D.   On: 01/01/2024 06:52   DG Abd Portable 1V-Small Bowel Obstruction Protocol-initial, 8 hr delay Result Date: 12/31/2023 CLINICAL DATA:  History provided by technologist small-bowel obstruction. 8 hour delay EXAM: PORTABLE ABDOMEN - 1 VIEW COMPARISON:  Radiograph from earlier the same day at 6:54 a.m. FINDINGS: There is increased amount of gas throughout the small bowel and colon, which may represent adynamic ileus versus partial small bowel obstruction. There is no positive contrast seen in the  bowel loops. Consider radiographic follow up if clinically indicated. No evidence of pneumoperitoneum, within the limitations of a supine film. No acute osseous abnormalities. The soft tissues are within normal limits. Surgical changes, devices, tubes and lines: None. IMPRESSION: *There is increased amount of gas throughout the small bowel and colon, which may represent adynamic ileus versus partial small bowel obstruction. There is  no positive contrast in the bowel loops. Consider radiographic follow up if clinically indicated. Electronically Signed   By: Ree Molt M.D.   On: 12/31/2023 16:35   DG Abd Portable 1V Result Date: 12/31/2023 CLINICAL DATA:  Small bowel obstruction. EXAM: PORTABLE ABDOMEN - 1 VIEW COMPARISON:  Abdominal radiograph dated 12/30/2023. FINDINGS: Enteric tube with tip in the left upper abdomen likely in the proximal stomach. Progression of small bowel dilatation measuring up to 5.6 cm in diameter. No free air noted. Excreted contrast noted in the urinary bladder. Degenerative changes spine. No acute osseous pathology. IMPRESSION: Progression of small bowel dilatation. Electronically Signed   By: Vanetta Chou M.D.   On: 12/31/2023 11:02   DG Abd Portable 1V Result Date: 12/30/2023 CLINICAL DATA:  Encounter for nasogastric tube placement EXAM: PORTABLE ABDOMEN - 1 VIEW COMPARISON:  None Available. FINDINGS: NG tube with tip in the gastric fundus. Side port below the GE junction. Dilated loop of small bowel in the upper abdomen measures 4 cm IMPRESSION: NG tube in stomach.  Side port below the GE junction. Electronically Signed   By: Jackquline Boxer M.D.   On: 12/30/2023 12:25   CT Angio Chest PE W and/or Wo Contrast Result Date: 12/30/2023 CLINICAL DATA:  PE suspected, abdominal pain, non-small-cell lung cancer * Tracking Code: BO * EXAM: CT ANGIOGRAPHY CHEST CT ABDOMEN AND PELVIS WITH CONTRAST TECHNIQUE: Multidetector CT imaging of the chest was performed using the standard protocol during bolus administration of intravenous contrast. Multiplanar CT image reconstructions and MIPs were obtained to evaluate the vascular anatomy. Multidetector CT imaging of the abdomen and pelvis was performed using the standard protocol during bolus administration of intravenous contrast. RADIATION DOSE REDUCTION: This exam was performed according to the departmental dose-optimization program which includes automated  exposure control, adjustment of the mA and/or kV according to patient size and/or use of iterative reconstruction technique. CONTRAST:  OMNIPAQUE  IOHEXOL  350 MG/ML SOLN COMPARISON:  CT chest abdomen pelvis, 12/11/2023 FINDINGS: CT CHEST ANGIOGRAM FINDINGS Cardiovascular: Satisfactory opacification of the pulmonary arteries to the segmental level. No evidence of pulmonary embolism. Mild cardiomegaly. Three-vessel coronary artery calcifications. No pericardial effusion. Aortic atherosclerosis. Mediastinum/Nodes: No enlarged mediastinal, hilar, or axillary lymph nodes. Thyroid  gland, trachea, and esophagus demonstrate no significant findings. Lungs/Pleura: Diminished volume of a left pleural effusion. Otherwise unchanged findings when compared to recent restaging examination with treated masses in the left upper lobe and right lower lobe. Musculoskeletal: No chest wall abnormality. No acute osseous findings. Nondisplaced, subacute pathologic fracture of the posterior left third rib (series 4, image 36). Review of the MIP images confirms the above findings. CT ABDOMEN PELVIS FINDINGS Hepatobiliary: No solid liver abnormality is seen. No gallstones, gallbladder wall thickening, or biliary dilatation. Pancreas: Unremarkable. No pancreatic ductal dilatation or surrounding inflammatory changes. Spleen: Normal in size without significant abnormality. Adrenals/Urinary Tract: Adrenal glands are unremarkable. Kidneys are normal, without renal calculi, solid lesion, or hydronephrosis. Bladder is unremarkable. Stomach/Bowel: Stomach is within normal limits. New wall thickening and inflammatory fat stranding about the cecum (series 2, image 47, series 8, image 93). Mildly distended, fluid-filled loops of mid  to distal small bowel throughout the abdomen, largest, distal loops measuring up to 4.1 cm in caliber. The terminal ileum is thickened and fecalized (series 2, image 49). Colon is generally decompressed of the rectum  with scattered gas and stool throughout. Sigmoid diverticulosis. Vascular/Lymphatic: Aortic atherosclerosis. No enlarged abdominal or pelvic lymph nodes. Reproductive: Prostatomegaly with biopsy marking clips and fiducials. Other: Small fat containing left inguinal hernia.  No ascites. Musculoskeletal: No acute osseous findings. Unchanged sclerotic metastases of the posterior right ninth rib and T9 vertebral body. Unchanged sclerotic osseous metastases of the left sacrum and ilium. IMPRESSION: 1. Negative examination for pulmonary embolism. 2. New wall thickening and inflammatory fat stranding about the cecum, consistent with nonspecific infectious or inflammatory colitis. 3. Mildly distended, fluid-filled loops of mid to distal small bowel throughout the abdomen, largest, distal loops measuring up to 4.1 cm in caliber. The terminal ileum is thickened and fecalized. Findings suggest obstipation of the ileocecal valve secondary to colitis and/or associated infectious or inflammatory ileus. 4. Diminished volume of a left pleural effusion. Otherwise unchanged oncologic findings when compared to recent restaging examination with treated masses in the left upper lobe and right lower lobe. 5. Unchanged sclerotic osseous metastases of the posterior right ninth rib and T9 vertebral body. Unchanged sclerotic osseous metastases of the left sacrum and ilium. 6. Coronary artery disease. Aortic Atherosclerosis (ICD10-I70.0). Electronically Signed   By: Marolyn JONETTA Jaksch M.D.   On: 12/30/2023 06:51   CT ABDOMEN PELVIS W CONTRAST Result Date: 12/30/2023 CLINICAL DATA:  PE suspected, abdominal pain, non-small-cell lung cancer * Tracking Code: BO * EXAM: CT ANGIOGRAPHY CHEST CT ABDOMEN AND PELVIS WITH CONTRAST TECHNIQUE: Multidetector CT imaging of the chest was performed using the standard protocol during bolus administration of intravenous contrast. Multiplanar CT image reconstructions and MIPs were obtained to evaluate the vascular  anatomy. Multidetector CT imaging of the abdomen and pelvis was performed using the standard protocol during bolus administration of intravenous contrast. RADIATION DOSE REDUCTION: This exam was performed according to the departmental dose-optimization program which includes automated exposure control, adjustment of the mA and/or kV according to patient size and/or use of iterative reconstruction technique. CONTRAST:  OMNIPAQUE  IOHEXOL  350 MG/ML SOLN COMPARISON:  CT chest abdomen pelvis, 12/11/2023 FINDINGS: CT CHEST ANGIOGRAM FINDINGS Cardiovascular: Satisfactory opacification of the pulmonary arteries to the segmental level. No evidence of pulmonary embolism. Mild cardiomegaly. Three-vessel coronary artery calcifications. No pericardial effusion. Aortic atherosclerosis. Mediastinum/Nodes: No enlarged mediastinal, hilar, or axillary lymph nodes. Thyroid  gland, trachea, and esophagus demonstrate no significant findings. Lungs/Pleura: Diminished volume of a left pleural effusion. Otherwise unchanged findings when compared to recent restaging examination with treated masses in the left upper lobe and right lower lobe. Musculoskeletal: No chest wall abnormality. No acute osseous findings. Nondisplaced, subacute pathologic fracture of the posterior left third rib (series 4, image 36). Review of the MIP images confirms the above findings. CT ABDOMEN PELVIS FINDINGS Hepatobiliary: No solid liver abnormality is seen. No gallstones, gallbladder wall thickening, or biliary dilatation. Pancreas: Unremarkable. No pancreatic ductal dilatation or surrounding inflammatory changes. Spleen: Normal in size without significant abnormality. Adrenals/Urinary Tract: Adrenal glands are unremarkable. Kidneys are normal, without renal calculi, solid lesion, or hydronephrosis. Bladder is unremarkable. Stomach/Bowel: Stomach is within normal limits. New wall thickening and inflammatory fat stranding about the cecum (series 2, image 47,  series 8, image 93). Mildly distended, fluid-filled loops of mid to distal small bowel throughout the abdomen, largest, distal loops measuring up to 4.1 cm in caliber. The  terminal ileum is thickened and fecalized (series 2, image 49). Colon is generally decompressed of the rectum with scattered gas and stool throughout. Sigmoid diverticulosis. Vascular/Lymphatic: Aortic atherosclerosis. No enlarged abdominal or pelvic lymph nodes. Reproductive: Prostatomegaly with biopsy marking clips and fiducials. Other: Small fat containing left inguinal hernia.  No ascites. Musculoskeletal: No acute osseous findings. Unchanged sclerotic metastases of the posterior right ninth rib and T9 vertebral body. Unchanged sclerotic osseous metastases of the left sacrum and ilium. IMPRESSION: 1. Negative examination for pulmonary embolism. 2. New wall thickening and inflammatory fat stranding about the cecum, consistent with nonspecific infectious or inflammatory colitis. 3. Mildly distended, fluid-filled loops of mid to distal small bowel throughout the abdomen, largest, distal loops measuring up to 4.1 cm in caliber. The terminal ileum is thickened and fecalized. Findings suggest obstipation of the ileocecal valve secondary to colitis and/or associated infectious or inflammatory ileus. 4. Diminished volume of a left pleural effusion. Otherwise unchanged oncologic findings when compared to recent restaging examination with treated masses in the left upper lobe and right lower lobe. 5. Unchanged sclerotic osseous metastases of the posterior right ninth rib and T9 vertebral body. Unchanged sclerotic osseous metastases of the left sacrum and ilium. 6. Coronary artery disease. Aortic Atherosclerosis (ICD10-I70.0). Electronically Signed   By: Marolyn JONETTA Jaksch M.D.   On: 12/30/2023 06:51   DG ABD ACUTE 2+V W 1V CHEST Result Date: 12/30/2023 CLINICAL DATA:  Abdominal pain. EXAM: DG ABDOMEN ACUTE WITH 1 VIEW CHEST COMPARISON:  Chest abdomen  pelvis CT 12/11/2023 FINDINGS: Low lung volumes. Ill-defined opacities are seen in the right parahilar and left mid lungs with associated fiducial markers. No pulmonary edema or substantial pleural effusion. The cardio pericardial silhouette is enlarged. No acute bony abnormality. Supine abdomen shows gaseous distention of small bowel in the central abdomen measuring up to 4.2 cm diameter. Lucency under the left hemidiaphragm is probably related to gaseous distention of the stomach . Free air under the left hemidiaphragm is considered less likely but not excluded. Degenerative changes are seen in the lumbar spine. IMPRESSION: 1. Gaseous distention of small bowel in the central abdomen measuring up to 4.2 cm diameter. Imaging features are compatible with small bowel obstruction. CT abdomen and pelvis with oral and intravenous contrast could be used to further evaluate. 2. Lucency under the left hemidiaphragm is probably related to gaseous distention of the stomach . Free gas under the left hemidiaphragm is considered less likely but not excluded. This could also be further assessed at the time of CT. Electronically Signed   By: Camellia Candle M.D.   On: 12/30/2023 05:33    ASSESSMENT AND PLAN: This is a very pleasant 83 years old white male with Recurrent/metastatic (T1c, N3, M1b) non-small cell lung cancer, adenocarcinoma initially diagnosed as early stage disease, stage I in March 2024 involving the right lower lobe and left upper lobe status post SBRT completed in May 2024.  The patient presented with recurrent and metastatic disease to bilateral supraclavicular, subpectoral, left axillary as well as upper abdominal lymphadenopathy and malignant left pleural effusion in December 2024 with confirmation of malignancy from the pleural effusion. Molecular studies by foundation 1 showed positive BRAF V600E mutation and PD-L1 expression of 1%. The patient is currently on treatment with encorafenib  450 mg p.o. daily  in addition to binimetinib  45 mg milligram p.o. twice daily.  First dose was July 22, 2023. He has been off the treatment since December 29, 2023.  He will resume his treatment on January 30, 2024 with reduced dose) from 150 mg p.o. daily in addition to binimetinib  45 mg p.o. once daily. Assessment and Plan Assessment & Plan Metastatic non-small cell lung adenocarcinoma Metastatic non-small cell lung adenocarcinoma diagnosed in May 2024. Previously on targeted therapy with encorafenib  and binimetinib . Recent discontinuation of therapy due to severe diarrhea. - Restart encorafenib  at 150 mg once daily starting January 31, 2024. - Restart binimetinib  at 3 capsules once daily starting January 31, 2024. - Evaluate tolerance to reduced dose therapy in two weeks.  Diarrhea secondary to targeted therapy (encorafenib /binimetinib ) Severe diarrhea experienced after taking encorafenib  and binimetinib , leading to dehydration and hospitalization. Diarrhea resolved after discontinuation of medication. - Monitor for recurrence of diarrhea upon restarting therapy at reduced dose. - Adjust medication dose based on tolerance and side effect profile.  Bacteremia Currently receiving IV antibiotics (Invanz ) for bacteremia. - Complete IV antibiotic course by January 29, 2024. He will come back for follow-up visit in 2 weeks for evaluation and repeat blood work. He was advised to call immediately if he has any other concerning symptoms in the interval. I discussed the assessment and treatment plan with the patient. The patient was provided an opportunity to ask questions and all were answered. The patient agreed with the plan and demonstrated an understanding of the instructions.   The patient was advised to call back or seek an in-person evaluation if the symptoms worsen or if the condition fails to improve as anticipated.  I provided 30 minutes of face-to-face video visit time during this encounter, and > 50% was spent  counseling as documented under my assessment & plan.  Chad MARLA [name], MD 01/28/2024 9:02 AM  Disclaimer: This note was dictated with voice recognition software. Similar sounding words can inadvertently be transcribed and may not be corrected upon review.

## 2024-01-28 NOTE — Progress Notes (Addendum)
 PROGRESS NOTE    Chad Moreno  FMW:[id number] DOB: 1941/01/03 DOA: 01/22/2024 PCP: [name], MD  Brief Narrative: 83 y.o. male with medical history significant of metastatic small cell lung cancer status post SBRT and currently on immunotherapy, chronic thrombocytopenia, hypertension, hyperlipidemia, gout, GERD. Recent hospital admission 7/7-7/16 for SBO, GI bleed from cecal colitis/diverticulosis, ESBL UTI, and AKI. Patient presented to the ED via EMS for evaluation of acute onset shortness of breath/respiratory distress. He was found to have findings concerning for  fluid overload, was placed on diuretics and admitted to the hospital. His blood cultures were positive for ESBL E. Coli, ID were consulted and recommended 7 days of antibiotics. TTE showed newly reduced EF, cardiology was consulted and recommended medical therapy. [facility].   8/5 patient identified as Chad Moreno 1940/12/29  I am located at home in [facility]  Assessment & Plan:   Principal Problem:   Acute CHF (congestive heart failure) (HCC) Active Problems:   Acute hypoxemic respiratory failure (HCC)   CAP (community acquired pneumonia)   SIRS (systemic inflammatory response syndrome) (HCC)   Elevated troponin   CHF exacerbation (HCC)   Pressure injury of skin   Acute systolic CHF (congestive heart failure) (HCC)   Hypokalemia  1) ESBL E. coli bacteremia - Blood culture on 7/31 grew 1/4 bottles of ESBL E. coli. Started on meropenem  Infectious disease was consulted and they recommend 7 total days of antibiotic (end date 8/6). Abx were changed to ertapenem  in anticipation to transfer to [facility] program. Repeat bcx obtained on 7/31 final result NO GROWTH. He complained of dysuria  8/2.UA showed cloudy urine with moderate leukocytes >50 wbc.reflex culture was ordered.How ever this has since then resolved and frequency has improved after  restarting Rapaflo  and a fluid bolus.   2) Acute systolic heart failure- Presented with hypoxia.Treated with IV Lasix  40 mg BID, initially diuresed well and was net -5 L, down 2 lbs.  TTE on 8/1 showed EF 30-35%, LV RWMA, mild LVH, mildly reduced RV function, normal IVC. Cardiology was consulted. Cardiomyopathy was thought to be likely secondary to his immunotherapy, stress CM, or ischemia. Patient deferred any invasive ischemic evaluation but was agreeable to medical management. Patient started on Lasix  40 mg daily, Entresto  24-26 mg twice daily and spironolactone  12.5 mg daily with plan for repeat TTE in 3 months. Patient does NOT want to take Entresto  anymore.he has NOT received Lasix  and Aldactone  8/2 and 8/3 due to weakness poor oral intake appears on the dry side with concentrated urine and positive orthostatics.He received a 500 cc NS ON 8/3. On 8/4 he felt better nausea resolved,no diarrhea,no dizziness.Restarted Lasix  at 20 mg daily with Aldactone  12.5 mg daily. 8/5 Continue lasix  20 mg with aldactone  12.5 mg daily Consider increasing lasix  to his usual dose 40 mg daily 8/6.He may need potassium supplement with lasix  even though he is taking aldactone .follow up BMP 8/6.He stopped taking Entresto  due to side effects hypotension,dizziness etc.   3) Acute hypoxic respiratory failure, resolved- Initially thought to be due to pneumonia and fluid overload,but mostly due to CHF. initially required BiPAP, he was placed on IV doxycycline  and IV lasix , and weaned off to room air on 8/1, respiratory status much improved and he is back to baseline. Repeat CXR shows improvement in aeration, doxycycline  discontinued on 8/1.Saturations remain above 92 percent on RA. He drops his saturation at night when he sleeps.wife reports he snores.dw  his pcp ASKED HIS WIFE TO CALL THE OFFICE TO SET UP A SLEEP STUDY.   4 ) Metastatic small cell lung cancer- Metastatic non-small cell lung cancer, adenocarcinoma, with BRAF  V600E mutation and PD-L1 expression of 1%. Diagnosed in May 2024 with malignant pleural effusion noted in December 2024. Currently on encorafenib  and binimetinib  since July 22, 2023. He reports recent diarrhea from current immunotherapy.He had virtual visit with Oncology Dr Roderic 8/5 planning on restarting chemo 8/8 after dc from [facility] on 8/7.  5) Anemia likely multifactorial hb stable and improved. Iron  deficiency, malignancy,chronic disease. Iron  panel indicates Iron  deficiency.Continue every other day iron  Added Vitamin C  for better absorption.   6) Troponin elevation- Troponin 27->124. No chest pain or ischemic changes on EKG. Secondary to demand ischemia.    7) Hypokalemia- resolved   8) Unstageable pressure injury to R/L buttocks, POA-  wound care saw him 8/2 recommends Cleanse with Vashe, pat dry the peri-wound skin, not rinse. Apply Santyl  daily on the wound bed. Cover with foam dressing, change every 3 days or PRN. Pressure redistribution pad for chair.   Media Information   Document Information   Hyacinth Richerd SAUNDERS, Paramedic  [facility]   9)Nausea- resolved .  10)BPH- Continue Rapaflo   ADDENDUM -he started having diarrhea.had 5 times today.imodium  prn given.no toxic signs.unclear due to recent chemo vs antibiotic induced vs c diff.If not better consider sending Cdiff tomorrow.follow labs in am too. 5 52 pm. Estimated body mass index is 24.7 kg/m as calculated from the following:   Height as of this encounter: 6' 1 (1.854 m).   Weight as of this encounter: 84.9 kg.  DVT prophylaxis:lovenox  Code Status:dnr Family Communication: wife at bedside Disposition Plan:  Status is: Inpatient Remains inpatient appropriate because: acute illness   Consultants:  ID CARDS WOUND CARE  Procedures: MIDLINE Antimicrobials: INVANZ   Subjective:   Sitting up in chair.wife next to him.had some frequency of urination at night No N/V/D No CP/SOB/EDEMA No  dizziness Objective: Vitals:   01/27/24 1118 01/27/24 1700 01/27/24 2100 01/28/24 0518  BP: (!) 147/77 132/82    Pulse: 77 72  65  Resp:    14  Temp:      TempSrc:      SpO2:   95% 97%  Weight: 84.9 kg     Height:       No intake or output data in the 24 hours ending 01/28/24 1122  Filed Weights   01/24/24 0454 01/24/24 1800 01/27/24 1118  Weight: 87.4 kg 82.6 kg 84.9 kg    Examination: patient seen with Richerd Hyacinth Exam by [facility] General exam: Appears in no distress Respiratory system:. Respiratory effort normal. Cardiovascular system: no tachycardia  No pedal edema. Gastrointestinal system:soft.non tender Central nervous system: Alert and oriented.  Extremities: no edema  Data Reviewed: I have personally reviewed following labs and imaging studies  CBC: Recent Labs  Lab 01/22/24 2125 01/23/24 0346 01/24/24 0218 01/27/24 0745  WBC 7.8 7.9 7.9 4.1  NEUTROABS 5.5  --   --  3.0  HGB 10.5* 10.3* 9.9* 11.1*  HCT 33.2* 32.3* 30.9* 34.4*  MCV 89.7 89.0 88.8 89.1  PLT 165 140* 163 142*   Basic Metabolic Panel: Recent Labs  Lab 01/22/24 2125 01/23/24 0346 01/24/24 0218 01/25/24 0500 01/27/24 1249  NA 141 141 145 142 137  K 3.7 3.1* 3.0* 3.1* 4.5  CL 107 104 105 107 106  CO2 22 24 29 26 22   GLUCOSE 129* 211* 117*  103* 113*  BUN 15 12 14 22 20   CREATININE 0.81 0.89 0.87 1.00 0.86  CALCIUM 8.4* 8.5* 8.3* 7.5* 8.3*  MG  --   --  1.9 1.8  --    GFR: Estimated Creatinine Clearance: 74.8 mL/min (by C-G formula based on SCr of 0.86 mg/dL). Liver Function Tests: Recent Labs  Lab 01/22/24 2125 01/24/24 0218 01/27/24 1249  AST 39 20 22  ALT 30 23 23   ALKPHOS 60 56 59  BILITOT 1.0 0.9 0.8  PROT 6.1* 5.7* 5.4*  ALBUMIN 2.9* 2.7* 2.8*   No results for input(s): LIPASE, AMYLASE in the last 168 hours. No results for input(s): AMMONIA in the last 168 hours. Coagulation Profile: No results for input(s): INR, PROTIME in the last 168  hours.  Cardiac Enzymes: No results for input(s): CKTOTAL, CKMB, CKMBINDEX, TROPONINI in the last 168 hours. BNP (last 3 results) No results for input(s): PROBNP in the last 8760 hours. HbA1C: No results for input(s): HGBA1C in the last 72 hours. CBG: No results for input(s): GLUCAP in the last 168 hours. Lipid Profile: No results for input(s): CHOL, HDL, LDLCALC, TRIG, CHOLHDL, LDLDIRECT in the last 72 hours. Thyroid  Function Tests: No results for input(s): TSH, T4TOTAL, FREET4, T3FREE, THYROIDAB in the last 72 hours. Anemia Panel: Recent Labs    01/27/24 1246  VITAMINB12 3,299*  FOLATE 25.6  FERRITIN 523*  TIBC 245*  IRON  34*   Sepsis Labs: Recent Labs  Lab 01/23/24 0346 01/23/24 0554  PROCALCITON 0.47  --   LATICACIDVEN 2.7* 2.0*    Recent Results (from the past 240 hours)  Resp panel by RT-PCR (RSV, Flu A&B, Covid) Anterior Nasal Swab     Status: None   Collection Time: 01/22/24  9:26 PM   Specimen: Anterior Nasal Swab  Result Value Ref Range Status   SARS Coronavirus 2 by RT PCR NEGATIVE NEGATIVE Final   Influenza A by PCR NEGATIVE NEGATIVE Final   Influenza B by PCR NEGATIVE NEGATIVE Final    Comment: (NOTE) The Xpert Xpress SARS-CoV-2/FLU/RSV plus assay is intended as an aid in the diagnosis of influenza from Nasopharyngeal swab specimens and should not be used as a sole basis for treatment. Nasal washings and aspirates are unacceptable for Xpert Xpress SARS-CoV-2/FLU/RSV testing.  Fact Sheet for Patients: BloggerCourse.com  Fact Sheet for Healthcare Providers: SeriousBroker.it  This test is not yet approved or cleared by the [place]  FDA and has been authorized for detection and/or diagnosis of SARS-CoV-2 by FDA under an Emergency Use Authorization (EUA). This EUA will remain in effect (meaning this test can be used) for the duration of the COVID-19 declaration  under Section 564(b)(1) of the Act, 21 U.S.C. section 360bbb-3(b)(1), unless the authorization is terminated or revoked.     Resp Syncytial Virus by PCR NEGATIVE NEGATIVE Final    Comment: (NOTE) Fact Sheet for Patients: BloggerCourse.com  Fact Sheet for Healthcare Providers: SeriousBroker.it  This test is not yet approved or cleared by the [place]  FDA and has been authorized for detection and/or diagnosis of SARS-CoV-2 by FDA under an Emergency Use Authorization (EUA). This EUA will remain in effect (meaning this test can be used) for the duration of the COVID-19 declaration under Section 564(b)(1) of the Act, 21 U.S.C. section 360bbb-3(b)(1), unless the authorization is terminated or revoked.  Performed at [facility], [address] [place], [place] [address]   MRSA Next Gen by PCR, Nasal     Status: None   Collection Time:  01/23/24  3:00 AM   Specimen: Nasal Mucosa; Nasal Swab  Result Value Ref Range Status   MRSA by PCR Next Gen NOT DETECTED NOT DETECTED Final    Comment: (NOTE) The GeneXpert MRSA Assay (FDA approved for NASAL specimens only), is one component of a comprehensive MRSA colonization surveillance program. It is not intended to diagnose MRSA infection nor to guide or monitor treatment for MRSA infections. Test performance is not FDA approved in patients less than 32 years old. Performed at [facility], [address], [place], [place] [address]   Culture, blood (Routine X 2) w Reflex to ID Panel     Status: Abnormal   Collection Time: 01/23/24  3:46 AM   Specimen: BLOOD RIGHT FOREARM  Result Value Ref Range Status   Specimen Description BLOOD RIGHT FOREARM  Final   Special Requests   Final    BOTTLES DRAWN AEROBIC AND ANAEROBIC Blood Culture adequate volume   Culture  Setup Time   Final    GRAM NEGATIVE RODS AEROBIC BOTTLE ONLY CRITICAL RESULT CALLED TO, READ BACK BY AND VERIFIED  WITH: [facility] AMDREW GRETEL [id number] AT 1748 BY EC Performed at [facility], [address], [place], [place] [address]    Culture (A)  Final    ESCHERICHIA COLI Confirmed Extended Spectrum Beta-Lactamase Producer (ESBL).  In bloodstream infections from ESBL organisms, carbapenems are preferred over piperacillin/tazobactam. They are shown to have a lower risk of mortality.    Report Status 01/25/2024 FINAL  Final   Organism ID, Bacteria ESCHERICHIA COLI  Final      Susceptibility   Escherichia coli - MIC*    AMPICILLIN >=32 RESISTANT Resistant     CEFEPIME 16 RESISTANT Resistant     CEFTAZIDIME RESISTANT Resistant     CEFTRIAXONE  >=64 RESISTANT Resistant     CIPROFLOXACIN 1 RESISTANT Resistant     GENTAMICIN <=1 SENSITIVE Sensitive     IMIPENEM <=0.25 SENSITIVE Sensitive     TRIMETH/SULFA >=320 RESISTANT Resistant     AMPICILLIN/SULBACTAM 16 INTERMEDIATE Intermediate     PIP/TAZO <=4 SENSITIVE Sensitive ug/mL    * ESCHERICHIA COLI  Culture, blood (Routine X 2) w Reflex to ID Panel     Status: None   Collection Time: 01/23/24  3:46 AM   Specimen: BLOOD LEFT HAND  Result Value Ref Range Status   Specimen Description BLOOD LEFT HAND  Final   Special Requests   Final    BOTTLES DRAWN AEROBIC AND ANAEROBIC Blood Culture adequate volume   Culture   Final    NO GROWTH 5 DAYS Performed at [facility], [address] [place], [place] [address]    Report Status 01/28/2024 FINAL  Final  Blood Culture ID Panel (Reflexed)     Status: Abnormal   Collection Time: 01/23/24  3:46 AM  Result Value Ref Range Status   Enterococcus faecalis NOT DETECTED NOT DETECTED Final   Enterococcus Faecium NOT DETECTED NOT DETECTED Final   Listeria monocytogenes NOT DETECTED NOT DETECTED Final   Staphylococcus species NOT DETECTED NOT DETECTED Final   Staphylococcus aureus (BCID) NOT DETECTED NOT DETECTED Final   Staphylococcus epidermidis NOT DETECTED NOT DETECTED Final   Staphylococcus  lugdunensis NOT DETECTED NOT DETECTED Final   Streptococcus species NOT DETECTED NOT DETECTED Final   Streptococcus agalactiae NOT DETECTED NOT DETECTED Final   Streptococcus pneumoniae NOT DETECTED NOT DETECTED Final   Streptococcus pyogenes NOT DETECTED NOT DETECTED Final   A.calcoaceticus-baumannii NOT DETECTED  NOT DETECTED Final   Bacteroides fragilis NOT DETECTED NOT DETECTED Final   Enterobacterales DETECTED (A) NOT DETECTED Final    Comment: Enterobacterales represent a large order of gram negative bacteria, not a single organism. CRITICAL RESULT CALLED TO, READ BACK BY AND VERIFIED WITH: PHARMD ANDREW MEYER [id number] AT 1748 BY EC    Enterobacter cloacae complex NOT DETECTED NOT DETECTED Final   Escherichia coli DETECTED (A) NOT DETECTED Final    Comment: CRITICAL RESULT CALLED TO, READ BACK BY AND VERIFIED WITH: PHARMD PRENTICE POISSON [id number] AT 1748 BY EC    Klebsiella aerogenes NOT DETECTED NOT DETECTED Final   Klebsiella oxytoca NOT DETECTED NOT DETECTED Final   Klebsiella pneumoniae NOT DETECTED NOT DETECTED Final   Proteus species NOT DETECTED NOT DETECTED Final   Salmonella species NOT DETECTED NOT DETECTED Final   Serratia marcescens NOT DETECTED NOT DETECTED Final   Haemophilus influenzae NOT DETECTED NOT DETECTED Final   Neisseria meningitidis NOT DETECTED NOT DETECTED Final   Pseudomonas aeruginosa NOT DETECTED NOT DETECTED Final   Stenotrophomonas maltophilia NOT DETECTED NOT DETECTED Final   Candida albicans NOT DETECTED NOT DETECTED Final   Candida auris NOT DETECTED NOT DETECTED Final   Candida glabrata NOT DETECTED NOT DETECTED Final   Candida krusei NOT DETECTED NOT DETECTED Final   Candida parapsilosis NOT DETECTED NOT DETECTED Final   Candida tropicalis NOT DETECTED NOT DETECTED Final   Cryptococcus neoformans/gattii NOT DETECTED NOT DETECTED Final   CTX-M ESBL DETECTED (A) NOT DETECTED Final    Comment: CRITICAL RESULT CALLED TO, READ BACK BY AND VERIFIED  WITH: PHARMD PRENTICE POISSON [id number] AT 1748 BY EC (NOTE) Extended spectrum beta-lactamase detected. Recommend a carbapenem as initial therapy.      Carbapenem resistance IMP NOT DETECTED NOT DETECTED Final   Carbapenem resistance KPC NOT DETECTED NOT DETECTED Final   Carbapenem resistance NDM NOT DETECTED NOT DETECTED Final   Carbapenem resist OXA 48 LIKE NOT DETECTED NOT DETECTED Final   Carbapenem resistance VIM NOT DETECTED NOT DETECTED Final    Comment: Performed at [facility], [address] [place], [place] [address]  Respiratory (~20 pathogens) panel by PCR     Status: None   Collection Time: 01/23/24  4:15 AM   Specimen: Nasopharyngeal Swab; Respiratory  Result Value Ref Range Status   Adenovirus NOT DETECTED NOT DETECTED Final   Coronavirus 229E NOT DETECTED NOT DETECTED Final    Comment: (NOTE) The Coronavirus on the Respiratory Panel, DOES NOT test for the novel  Coronavirus (2019 nCoV)    Coronavirus HKU1 NOT DETECTED NOT DETECTED Final   Coronavirus NL63 NOT DETECTED NOT DETECTED Final   Coronavirus OC43 NOT DETECTED NOT DETECTED Final   Metapneumovirus NOT DETECTED NOT DETECTED Final   Rhinovirus / Enterovirus NOT DETECTED NOT DETECTED Final   Influenza A NOT DETECTED NOT DETECTED Final   Influenza B NOT DETECTED NOT DETECTED Final   Parainfluenza Virus 1 NOT DETECTED NOT DETECTED Final   Parainfluenza Virus 2 NOT DETECTED NOT DETECTED Final   Parainfluenza Virus 3 NOT DETECTED NOT DETECTED Final   Parainfluenza Virus 4 NOT DETECTED NOT DETECTED Final   Respiratory Syncytial Virus NOT DETECTED NOT DETECTED Final   Bordetella pertussis NOT DETECTED NOT DETECTED Final   Bordetella Parapertussis NOT DETECTED NOT DETECTED Final   Chlamydophila pneumoniae NOT DETECTED NOT DETECTED Final   Mycoplasma pneumoniae NOT DETECTED NOT DETECTED Final    Comment: Performed at [facility], [address], [place], [place] [address]  Culture, blood (Routine X 2) w  Reflex to ID Panel     Status: None   Collection Time: 01/23/24  6:50 PM   Specimen: BLOOD RIGHT HAND  Result Value Ref Range Status   Specimen Description BLOOD RIGHT HAND  Final   Special Requests   Final    BOTTLES DRAWN AEROBIC ONLY Blood Culture results may not be optimal due to an inadequate volume of blood received in culture bottles   Culture   Final    NO GROWTH 5 DAYS Performed at [facility], [address] [place], [place] [address]    Report Status 01/28/2024 FINAL  Final  Culture, blood (Routine X 2) w Reflex to ID Panel     Status: None   Collection Time: 01/23/24  6:55 PM   Specimen: BLOOD LEFT HAND  Result Value Ref Range Status   Specimen Description BLOOD LEFT HAND  Final   Special Requests   Final    BOTTLES DRAWN AEROBIC ONLY Blood Culture results may not be optimal due to an inadequate volume of blood received in culture bottles   Culture   Final    NO GROWTH 5 DAYS Performed at [facility], [address], [place], [place] [address]    Report Status 01/28/2024 FINAL  Final     Radiology Studies: No results found.   Scheduled Meds:  acidophilus  1 capsule Oral Daily   ascorbic acid   500 mg Oral Daily   celecoxib   200 mg Oral BID   enoxaparin  (LOVENOX ) injection  40 mg Subcutaneous Q24H   feeding supplement (GLUCERNA SHAKE)  237 mL Oral TID BM   ferrous sulfate   325 mg Oral QODAY   furosemide   20 mg Oral Daily   Gerhardt's butt cream   Topical BID   leptospermum manuka honey  1 Application Topical Daily   pantoprazole   40 mg Oral BID   silodosin   8 mg Oral Q breakfast   sodium chloride  flush  10-40 mL Intracatheter Q12H   spironolactone   12.5 mg Oral Daily   Continuous Infusions:  ertapenem  Stopped (01/28/24 1116)     LOS: 5 days     Almarie KANDICE Hoots, MD 01/28/2024, 11:22 AM

## 2024-01-29 ENCOUNTER — Other Ambulatory Visit: Payer: Self-pay | Admitting: Nurse Practitioner

## 2024-01-29 LAB — COMPREHENSIVE METABOLIC PANEL WITH GFR
ALT: 20 U/L (ref 0–44)
AST: 23 U/L (ref 15–41)
Albumin: 3.2 g/dL — ABNORMAL LOW (ref 3.5–5.0)
Alkaline Phosphatase: 62 U/L (ref 38–126)
Anion gap: 10 (ref 5–15)
BUN: 16 mg/dL (ref 8–23)
CO2: 23 mmol/L (ref 22–32)
Calcium: 8.7 mg/dL — ABNORMAL LOW (ref 8.9–10.3)
Chloride: 104 mmol/L (ref 98–111)
Creatinine, Ser: 0.86 mg/dL (ref 0.61–1.24)
GFR, Estimated: >60 mL/min (ref >60)
Glucose, Bld: 101 mg/dL — ABNORMAL HIGH (ref 70–99)
Potassium: 4.1 mmol/L (ref 3.5–5.1)
Sodium: 137 mmol/L (ref 135–145)
Total Bilirubin: 0.6 mg/dL (ref 0.0–1.2)
Total Protein: 6.3 g/dL — ABNORMAL LOW (ref 6.5–8.1)

## 2024-01-29 LAB — CBC
HCT: 36.3 % — ABNORMAL LOW (ref 39.0–52.0)
Hemoglobin: 11.6 g/dL — ABNORMAL LOW (ref 13.0–17.0)
MCH: 28.3 pg (ref 26.0–34.0)
MCHC: 32 g/dL (ref 30.0–36.0)
MCV: 88.5 fL (ref 80.0–100.0)
Platelets: 113 K/uL — ABNORMAL LOW (ref 150–400)
RBC: 4.1 MIL/uL — ABNORMAL LOW (ref 4.22–5.81)
RDW: 15.9 % — ABNORMAL HIGH (ref 11.5–15.5)
WBC: 4.2 K/uL (ref 4.0–10.5)
nRBC: 0 % (ref 0.0–0.2)

## 2024-01-29 LAB — MAGNESIUM: Magnesium: 2.2 mg/dL (ref 1.7–2.4)

## 2024-01-29 MED ORDER — SODIUM CHLORIDE 0.9 % IV BOLUS
250.0000 mL | Freq: Once | INTRAVENOUS | Status: AC
  Administered 2024-01-29: 250 mL via INTRAVENOUS

## 2024-01-29 MED ORDER — SODIUM CHLORIDE 0.9 % IV SOLN: INTRAVENOUS | Status: AC

## 2024-01-29 NOTE — Progress Notes (Signed)
 Arrived to find the patient sitting on the recliner. Pt looked pale and exhausted. Pt wife stated that she was able to get him out of the bathroom and back to the living room without incident but did stop to take 3 breaks. Vitals were assessed as documented. 250ml bolus was given followed by a NS infusion. After the bolus the patient appeared back to baseline. Pt was able to stand for orthostatic vitals without getting dizzy or any assistance. Blood was also drawn and sent to the lab. Pt and wife was educated about the pump and keeping it plugged in, was also advised to called the nurse if the pump starts beeping.

## 2024-01-29 NOTE — Plan of Care (Signed)
  Problem: Education: Goal: Knowledge of General Education information will improve Description: Including pain rating scale, medication(s)/side effects and non-pharmacologic comfort measures Outcome: Progressing   Problem: Health Behavior/Discharge Planning: Goal: Ability to manage health-related needs will improve Outcome: Progressing   Problem: Clinical Measurements: Goal: Ability to maintain clinical measurements within normal limits will improve Outcome: Progressing Goal: Diagnostic test results will improve Outcome: Progressing Goal: Respiratory complications will improve Outcome: Progressing   Problem: Activity: Goal: Risk for activity intolerance will decrease Outcome: Progressing   Problem: Nutrition: Goal: Adequate nutrition will be maintained Outcome: Progressing   Problem: Pain Managment: Goal: General experience of comfort will improve and/or be controlled Outcome: Progressing   Problem: Safety: Goal: Ability to remain free from injury will improve Outcome: Progressing   Problem: Skin Integrity: Goal: Risk for impaired skin integrity will decrease Outcome: Progressing   Problem: Activity: Goal: Ability to tolerate increased activity will improve Outcome: Progressing

## 2024-01-29 NOTE — Progress Notes (Addendum)
 Spoke with the patient's wife over the phone. She mentioned that the patient did not have a good night. He was voiding frequently with intermittent burning. He also reported nausea and diarrhea overnight. The wife was told about the paramedics arrival between 0900-1000.  @1040 : On virtual call with patient. Patient reports dizziness. Denies SOB. Pt encouraged to increase PO intake. Plan of care reviewed with patient and spouse. Per. Dr. Fausto, hold AM lasix .

## 2024-01-29 NOTE — Progress Notes (Addendum)
 Progress Note   Patient: Chad Moreno FMW:[id number] DOB: [birth year] DOA: 01/22/2024     6 DOS: the patient was seen and examined on 01/29/2024    [facility] Encounter Patient identified as Chad Moreno DOB [birth year]  I am located in [place] Medic [place] present in the home and performed the physical exam and assessment.   Brief hospital course:  83 y.o. male with medical history significant of metastatic small cell lung cancer status post SBRT and currently on immunotherapy, chronic thrombocytopenia, hypertension, hyperlipidemia, gout, GERD. Recent hospital admission 7/7-7/16 for SBO, GI bleed from cecal colitis/diverticulosis, ESBL UTI, and AKI. Patient presented to the ED via EMS for evaluation of acute onset shortness of breath/respiratory distress. He was found to have findings concerning for  fluid overload, was placed on diuretics and admitted to the hospital. His blood cultures were positive for ESBL E. Coli, ID were consulted and recommended 7 days of antibiotics. TTE showed newly reduced EF, cardiology was consulted and recommended medical therapy. Ad[facility] to [facility] program on 8/1.    Patient completed 7 day course of IV antibiotics.  Clinical course complicated by progressive generalized weakness, orthostatic hypotension and dizziness with near-syncope.  Patient requiring additional IV fluids today, 8/6 with BP drop to 102/60 when up to bathroom, and pt too dizzy to stand more than 20 seconds with Medic in home this morning.   Assessment and Plan:  ESBL E. coli bacteremia - Blood culture on 7/31 grew 1/4 bottles of ESBL E. coli.  Started on meropenem   Infectious disease was consulted, recommend 7 total days of antibiotic (end date 8/6).  Changed to ertapenem  in anticipation to transfer to [facility] program. Repeat bcx obtained on 7/31 final result NO GROWTH. He complained of dysuria  8/2. UA showed cloudy urine with moderate leukocytes >50  wbc.reflex culture was ordered.How ever this has since then resolved and frequency has improved after restarting Rapaflo  and a fluid bolus. --Completed final dose ertapenem  today 8/6   Acute systolic heart failure- Presented with hypoxia. Treated with IV Lasix  40 mg BID, initially diuresed well and was net -5 L, down 2 lbs.  TTE on 8/1 showed EF 30-35%, LV RWMA, mild LVH, mildly reduced RV function, normal IVC.  Cardiology was consulted. Cardiomyopathy was thought to be likely secondary to his immunotherapy, stress CM, or ischemia. Patient deferred any invasive ischemic evaluation but was agreeable to medical management.  Started on Lasix  40 mg daily, Entresto  24-26 mg twice daily and spironolactone  12.5 mg daily with plan for repeat TTE in 3 months. PLAN: --Entresto  stopped due to causing hypotension, dizziness --Hold Lasix  and Aldactone  -- pt orthostatic, clinically showing signs of dehydration.  These were also held on 8/2, 8/3 for weakness, poor PO intake, clinically dry --GDMT currently limited by soft BP and orthostatic symptoms  Orthostatic hypotension / dizzyness / near-syncope Suspect over-diuresis vs poor PO intake with addition of diuretics, not maintaining adequate fluid balance. Previously given 500 cc NS on 8/3 8/6 -- pt dizzy and near-syncopal when up to bathroom this afternoon. BP was 102/60. --250 cc NS bolus, followed by 50 cc/hr --Hold Lasix  20 mg daily (prior dose 40 mg) --Hold Aldactone  12.5 mg daily --May need PRN diuretic dosing on d/c --Daily Orthostatic vitals to monitor --Fall precautions    Acute hypoxic respiratory failure, resolved- Initially thought to be due to pneumonia and fluid overload, but mostly due to CHF.  Initially required BiPAP.  Was started on IV doxycycline   and IV lasix . Weaned to room air on 8/1. Respiratory status much improved and at baseline. Repeat CXR 8/1 showed improvement in aeration. Doxycycline  discontinued on 8/1. Note -- He drops his  saturation at night when he sleeps.wife reports he snores.dw his pcp ASKED HIS WIFE TO CALL THE OFFICE TO SET UP A SLEEP STUDY.   Metastatic small cell lung cancer- Metastatic non-small cell lung cancer, adenocarcinoma, with BRAF V600E mutation and PD-L1 expression of 1%. Diagnosed in May 2024 with malignant pleural effusion noted in December 2024. Currently on encorafenib  and binimetinib  since July 22, 2023. He reports recent diarrhea from current immunotherapy.  He had virtual visit with Oncology Dr Roderic 8/5 planning on restarting chemo 8/8 after dc from [facility] -- which was planned 8/7 but may be later due to low BP's.   Anemia likely multifactorial - improved, stable. Iron  deficiency, malignancy,chronic disease. Iron  panel indicates Iron  deficiency. --Continue every other day iron  supplement --Added Vitamin C  for better absorption  Thrombocytopenia - platelets 163 >> 142 >> 113k today. ?Due to immunotx? --Monitor CBC --Hold Lovenox    Troponin elevation- Troponin 27->124. No chest pain or ischemic changes on EKG. Secondary to demand ischemia.    Hypokalemia- resolved   Unstageable pressure injury to R/L buttocks, POA- Wound care saw him 8/2 recommends: Cleanse with Vashe, pat dry the peri-wound skin, not rinse. Apply Santyl  daily on the wound bed. Cover with foam dressing, change every 3 days or PRN. Pressure redistribution pad for chair. --Image in Media from 01/28/24 Medic visit --Home health RN ordered to assist with wound care and monitoring post-discharge  Nausea- resolved   BPH - Continue Rapaflo         Subjective: Pt seen virtually this AM with Medic in the home, wife present.  Pt reports being up most of the night urinating about every 1-1.5 hours. Reports normal urine stream and flow.  He denies shortness of breath..  He reports diarrhea improved after 2 doses of Imodium .  Reports this is a chronic intermittent problem.  He otherwise feels more weak today and reported  to have poor PO intake as well.  He initially felt better after Entresto  was stopped, until today, now having recurrent dizziness when he gets up.   Physical Exam: Vitals:   01/28/24 2130 01/29/24 0945 01/29/24 1411 01/29/24 1500  BP:  (!) 145/81  (!) 102/57  Pulse:  82 82 78  Resp:   17 20  Temp:      TempSrc:      SpO2: 100%  96% 98%  Weight:      Height:       General exam: awake, alert, no acute distress HEENT: atraumatic, clear conjunctiva, anicteric sclera, hearing grossly normal  Respiratory system: CTAB, no wheezes, rales or rhonchi, normal respiratory effort at rest on room air. Cardiovascular system: normal S1/S2, RRR, no peripheral edema.   Gastrointestinal system: soft, NT, ND, +bowel sounds. Central nervous system: A&O x 3. no gross focal neurologic deficits, normal speech Extremities: moves all , no edema, normal tone Skin: dry, intact, normal temperature Psychiatry: normal mood, congruent affect, judgement and insight appear normal    Data Reviewed:  Notable labs -- CMP today notable only for glucose 101, Ca 8.8, albumin 3.2.  Hbg stable 11.1 >> 11.6 Platelets 142 >> 113k  Family Communication: wife present in the home during virtual encounter  Disposition: Status is: Inpatient Remains inpatient appropriate because: pt having severe orthostatic dizziness, requiring IV fluids and ongoing close monitoring and improvement  Planned Discharge Destination: Home with Home Health    Time spent: 45 minutes  Author: Burnard DELENA Cunning, DO 01/29/2024 4:23 PM  For on call review [email or web address].

## 2024-01-29 NOTE — Progress Notes (Signed)
 Received a phone call from the patient's wife. Patient stated he felt like he was going to  pass out. His wife took his blood pressure. BP 102/60. Video camera was turned on. Patient was seen sitting on the toilet. No acute distress noted. Another blood pressure was taken, BP 109/70, HR 99. Patient wanted to get up from the toilet but was unable to do it himself. His wife was unable to assist him. A neighbor came to the house and assisted the patient off the toilet. Patient was able to make it to the living room chair. Patient stated the dizziness had subsided some. Dr. Fausto made aware. NS IVF ordered. Paramedic, Shawn en route to patient's house.

## 2024-01-29 NOTE — Plan of Care (Signed)
  Problem: Education: Goal: Knowledge of General Education information will improve Description: Including pain rating scale, medication(s)/side effects and non-pharmacologic comfort measures Outcome: Progressing   Problem: Health Behavior/Discharge Planning: Goal: Ability to manage health-related needs will improve Outcome: Progressing   Problem: Clinical Measurements: Goal: Ability to maintain clinical measurements within normal limits will improve Outcome: Progressing Goal: Will remain free from infection Description: IV Abx Outcome: Progressing Goal: Diagnostic test results will improve Outcome: Progressing Goal: Respiratory complications will improve Outcome: Progressing Goal: Cardiovascular complication will be avoided Outcome: Progressing   Problem: Activity: Goal: Risk for activity intolerance will decrease Outcome: Progressing   Problem: Nutrition: Goal: Adequate nutrition will be maintained Outcome: Progressing   Problem: Coping: Goal: Level of anxiety will decrease Outcome: Progressing   Problem: Elimination: Goal: Will not experience complications related to bowel motility Outcome: Progressing Goal: Will not experience complications related to urinary retention Outcome: Progressing   Problem: Pain Managment: Goal: General experience of comfort will improve and/or be controlled Outcome: Progressing   Problem: Safety: Goal: Ability to remain free from injury will improve Outcome: Progressing   Problem: Skin Integrity: Goal: Risk for impaired skin integrity will decrease Outcome: Progressing   Problem: Activity: Goal: Ability to tolerate increased activity will improve Outcome: Progressing   Problem: Clinical Measurements: Goal: Ability to maintain a body temperature in the normal range will improve Outcome: Progressing   Problem: Respiratory: Goal: Ability to maintain adequate ventilation will improve Outcome: Progressing Goal: Ability to  maintain a clear airway will improve Outcome: Progressing   Problem: Activity: Goal: Capacity to carry out activities will improve Outcome: Progressing   Problem: Cardiac: Goal: Ability to achieve and maintain adequate cardiopulmonary perfusion will improve Outcome: Progressing

## 2024-01-29 NOTE — Progress Notes (Signed)
 Called patient and spoke to his wife Barnie on the phone. Patient doing fine at this moment. Denies pain or discomfort. Plan made to administer bedtime medication around 10:00 p.m. Encouraged to call RN if needed prior to that time.

## 2024-01-29 NOTE — Progress Notes (Signed)
 Virtual visit with patient. He c/o another episode of loose bowel movement after clear diet intake. He does not want to take his Celebrex  tonight due to stomach unease. Recommended another dose of imodium  prior to bed. No other complaints. In good spirits. We discussed yogurt for probiotic effect and monitoring for dehydration. Encouraged to call with any questions or concerns that develop overnight.

## 2024-01-30 LAB — CBC
HCT: 34.1 % — ABNORMAL LOW (ref 39.0–52.0)
Hemoglobin: 11.1 g/dL — ABNORMAL LOW (ref 13.0–17.0)
MCH: 28.8 pg (ref 26.0–34.0)
MCHC: 32.6 g/dL (ref 30.0–36.0)
MCV: 88.3 fL (ref 80.0–100.0)
Platelets: 101 K/uL — ABNORMAL LOW (ref 150–400)
RBC: 3.86 MIL/uL — ABNORMAL LOW (ref 4.22–5.81)
RDW: 15.5 % (ref 11.5–15.5)
WBC: 4.5 K/uL (ref 4.0–10.5)
nRBC: 0 % (ref 0.0–0.2)

## 2024-01-30 LAB — BASIC METABOLIC PANEL WITH GFR
Anion gap: 11 (ref 5–15)
BUN: 18 mg/dL (ref 8–23)
CO2: 21 mmol/L — ABNORMAL LOW (ref 22–32)
Calcium: 8.4 mg/dL — ABNORMAL LOW (ref 8.9–10.3)
Chloride: 105 mmol/L (ref 98–111)
Creatinine, Ser: 0.9 mg/dL (ref 0.61–1.24)
GFR, Estimated: >60 mL/min (ref >60)
Glucose, Bld: 99 mg/dL (ref 70–99)
Potassium: 4.2 mmol/L (ref 3.5–5.1)
Sodium: 137 mmol/L (ref 135–145)

## 2024-01-30 MED ORDER — SODIUM CHLORIDE 0.9 % IV BOLUS
250.0000 mL | Freq: Once | INTRAVENOUS | Status: AC
  Administered 2024-01-30: 250 mL via INTRAVENOUS

## 2024-01-30 NOTE — Progress Notes (Signed)
 PT Cancellation Note  Patient Details Name: Chad Moreno MRN: [id number] DOB: 12-Nov-1940   Cancelled Treatment:    Reason Eval/Treat Not Completed: Other (comment)  Scheduled PT follow-up for 3:00; unable to reach virtually after 30 min. Spoke with RN, we will attempt again tomorrow. Please reach out for any questions we can assist with. Thank you!  Leontine Roads, PT, DPT [facility]  Rehabilitation Services Physical Therapist Office: [phone] Website: [email or web address]    Leontine GORMAN Roads 01/30/2024, 3:30 PM

## 2024-01-30 NOTE — Progress Notes (Signed)
 Video chat with patient and medic just completed. Patient found sitting in chair and appears to be doing well this evening. Has some discomfort in his bottom which we will give prn Tylenol  for. Patient states he was able to get up and move around better today. RN will call patient a little later to ensure no new needs or request.

## 2024-01-30 NOTE — TOC Transition Note (Signed)
 Transition of Care [facility]) - Discharge Note   Patient Details  Name: Chad Moreno MRN: [id number] Date of Birth: Jan 04, 1941  [facility] of [facility]) CM/SW Contact:  Debarah Saunas, RN Phone Number: 01/30/2024, 9:22 AM   Clinical Narrative:     RNCM spoke with pt spouse, Barnie at bedside regarding discharge planning for [facility]. Offered pt medicare.gov list of home health agencies to choose from.  Pt chose [facility] to render RN and PT services. [facility] notified. Barnie made aware that [facility] will be in contact in 24-48 hours.  No DME needs identified at this time.    Final next level of care: (P) Home w Home Health Services Barriers to Discharge: (P) Continued Medical Work up   Patient Goals and CMS Choice Patient states their goals for this hospitalization and ongoing recovery are:: to return home and get better, get back to himself                                 Discharge Plan and Services Additional resources added to the After Visit Summary for     Discharge Planning Services: CM Consult                      HH Arranged: (P) RN, PT          Social Drivers of Health (SDOH) Interventions SDOH Screenings   Food Insecurity: No Food Insecurity (01/23/2024)  Housing: Low Risk  (01/24/2024)  Transportation Needs: No Transportation Needs (01/23/2024)  Utilities: Not At Risk (01/23/2024)  Depression (PHQ2-9): Low Risk  (07/31/2022)  Social Connections: Socially Integrated (01/23/2024)  Tobacco Use: Medium Risk (01/22/2024)     Readmission Risk Interventions    01/25/2024    8:49 AM 12/31/2023    1:01 PM  Readmission Risk Prevention Plan  Transportation Screening Complete Complete  PCP or Specialist Appt within 5-7 Days Not Complete Complete  Not Complete comments will obtain closer to discharge date   Home Care Screening Complete Complete  Medication Review (RN CM) Complete Complete

## 2024-01-30 NOTE — Plan of Care (Signed)
 Patient alert & oriented x 3. Accompanied by spouse. Patient and spouse report he is doing better today as compared to yesterday. C/O feeling physically weak. Still orthostatic with BP.    Problem: Education: Goal: Knowledge of General Education information will improve Description: Including pain rating scale, medication(s)/side effects and non-pharmacologic comfort measures Outcome: Progressing   Problem: Clinical Measurements: Goal: Will remain free from infection Description: IV Abx Outcome: Progressing Goal: Respiratory complications will improve Outcome: Progressing   Problem: Nutrition: Goal: Adequate nutrition will be maintained Outcome: Progressing   Problem: Elimination: Goal: Will not experience complications related to bowel motility Outcome: Progressing   Problem: Skin Integrity: Goal: Risk for impaired skin integrity will decrease Outcome: Progressing Note: Wound care completed today

## 2024-01-30 NOTE — Progress Notes (Signed)
 Patient took all bedtime medication without issues last night. Patient did complain of buttocks hurting and took a prn dose of Tylenol . No current health alarms, or calls from patient overnight.

## 2024-01-30 NOTE — Progress Notes (Signed)
 Pt seen for routine [facility] visit this morning. Pt slept well and states he feels somewhat better today. Pt appears well and needs to use the bathroom on arrival. Pt assisted to walk to bathroom with minimal assistance. Pt states he feels weak in his legs when walking but does not feel dizzy. Vital signs including orthostatic BP obtained. Medications administered as noted in MAR.  Wound care performed using medihoney on wound bed and Gerhardt's cream on peri wound. Everything covered using sacral Mepilex. Slight redness noted to [name] rectal area and Gerhardt's applied to same with RN Darice Amen's recommendation. Wound looks much better. I attempted taking photos using Rover and media tab but I noticed after visit the session may have timed out prior to saving. [facility] RN and team notified.   Labs drawn. IV maintenance performed and curos cap applied.   Per Dr. Fausto will give NS bolus at afternoon visit.   IMM letter signed and copy left at [name] home, other copy in [name] shadow chart at hub.

## 2024-01-30 NOTE — Progress Notes (Signed)
 Hospital [facility] Progress Note   Patient: Chad Moreno DOB: [birth year] DOA: 01/22/2024     7 DOS: the patient was seen and examined on 01/30/2024    [facility] Encounter Patient identified as Chad Moreno DOB 1940-08-11  I am located in [place] Medic Lauraine present in the home and performed the physical exam and assessment.   Brief hospital course:  83 y.o. male with medical history significant of metastatic small cell lung cancer status post SBRT and currently on immunotherapy, chronic thrombocytopenia, hypertension, hyperlipidemia, gout, GERD. Recent hospital admission 7/7-7/16 for SBO, GI bleed from cecal colitis/diverticulosis, ESBL UTI, and AKI. Patient presented to the ED via EMS for evaluation of acute onset shortness of breath/respiratory distress. He was found to have findings concerning for  fluid overload, was placed on diuretics and admitted to the hospital. His blood cultures were positive for ESBL E. Coli, ID were consulted and recommended 7 days of antibiotics. TTE showed newly reduced EF, cardiology was consulted and recommended medical therapy. Ad[facility] to [facility] program on 8/1.    Patient completed 7 day course of IV antibiotics.  Clinical course complicated by progressive generalized weakness, orthostatic hypotension and dizziness with near-syncope.  Patient requiring additional IV fluids on 8/6 with BP drop to 102/60 when up to bathroom, and pt too dizzy to stand more than 20 seconds.  8/7 Orthostatic symptoms improving after IV hydration and holding diuretics.  BP's still dropping today, however (149/74 >> 108/72 on AM orthostatic vitals), but with less dizziness.   Assessment and Plan:  ESBL E. coli bacteremia - Blood culture on 7/31 grew 1/4 bottles of ESBL E. coli.  Started on meropenem   Infectious disease was consulted, recommend 7 total days of antibiotic (end date 8/6).  Changed to ertapenem  in anticipation to transfer  to [facility] at [facility] program. Repeat bcx obtained on 7/31 final result NO GROWTH. He complained of dysuria  8/2. UA showed cloudy urine with moderate leukocytes >50 wbc.reflex culture was ordered.How ever this has since then resolved and frequency has improved after restarting Rapaflo  and a fluid bolus. --Completed final dose ertapenem  today 8/6   Acute systolic heart failure- Presented with hypoxia. Treated with IV Lasix  40 mg BID, initially diuresed well and was net -5 L, down 2 lbs.  TTE on 8/1 showed EF 30-35%, LV RWMA, mild LVH, mildly reduced RV function, normal IVC.  Cardiology was consulted. Cardiomyopathy was thought to be likely secondary to his immunotherapy, stress CM, or ischemia. Patient deferred any invasive ischemic evaluation but was agreeable to medical management.  Started on Lasix  40 mg daily, Entresto  24-26 mg twice daily and spironolactone  12.5 mg daily with plan for repeat TTE in 3 months. PLAN: --Entresto  stopped due to causing hypotension, dizziness --Hold Lasix  and Aldactone  -- pt orthostatic, clinically showing signs of dehydration.  These were also held on 8/2, 8/3 for weakness, poor PO intake, clinically dry --Will plan to d/c on DAILY PRN dose Lasix  --GDMT currently limited by soft BP and orthostatic symptoms --Will need close Cardiology follow up  Orthostatic hypotension / dizzyness / near-syncope Suspect over-diuresis vs poor PO intake with addition of diuretics, not maintaining adequate fluid balance. Previously given 500 cc NS on 8/3 8/6 -- pt dizzy and near-syncopal when up to bathroom this afternoon. BP was 102/60. 8/6 given 250 cc NS bolus, followed by 50 cc/hr 8/7 AM - still +orthostatic vitals this AM, less dizziness - improving --Repeat 250 cc bolus NS this  afternoon & repeat orthostatic vitals --Hold Lasix  20 mg daily (prior dose 40 mg) --Hold Aldactone  12.5 mg daily --Daily Orthostatic vitals to monitor --Fall precautions    Acute hypoxic  respiratory failure, resolved- Initially thought to be due to pneumonia and fluid overload, but mostly due to CHF.  Initially required BiPAP.  Was started on IV doxycycline  and IV lasix . Weaned to room air on 8/1. Respiratory status much improved and at baseline. Repeat CXR 8/1 showed improvement in aeration. Doxycycline  discontinued on 8/1. Note -- He drops his saturation at night when he sleeps.wife reports he snores.dw his pcp ASKED HIS WIFE TO CALL THE OFFICE TO SET UP A SLEEP STUDY.   Metastatic small cell lung cancer- Metastatic non-small cell lung cancer, adenocarcinoma, with BRAF V600E mutation and PD-L1 expression of 1%. Diagnosed in May 2024 with malignant pleural effusion noted in December 2024. Currently on encorafenib  and binimetinib  since July 22, 2023. He reports recent diarrhea from current immunotherapy.  He had virtual visit with Oncology Dr Roderic 8/5 planning on restarting chemo 8/8 after dc from [facility] -- which was planned 8/7 but may be later due to low BP's.   Anemia likely multifactorial - improved, stable. Iron  deficiency, malignancy,chronic disease. Iron  panel indicates Iron  deficiency. --Continue every other day iron  supplement --Added Vitamin C  for better absorption  Thrombocytopenia - platelets 163 >> 142 >> 113 >> 101k today.  ?Due to immunotx? --Monitor CBC --Hold Lovenox    Troponin elevation- Troponin 27->124. No chest pain or ischemic changes on EKG. Secondary to demand ischemia.    Hypokalemia- resolved   Unstageable pressure injury to R/L buttocks, POA- Wound care saw him 8/2 recommends: Cleanse with Vashe, pat dry the peri-wound skin, not rinse. Apply Santyl  daily on the wound bed. Cover with foam dressing, change every 3 days or PRN. Pressure redistribution pad for chair. --Image in Media from 01/28/24 Medic visit --Home health RN ordered to assist with wound care and monitoring post-discharge  Nausea- resolved   BPH - Continue Rapaflo          Subjective: Pt seen virtually this AM with Medic in the home, wife present.  Pt reports feeling better today than yesterday.  He'd just returned to his chair after trip to bathroom and up for orhtostatic vitals, recovering.  He rpeorts urinary frequency improved later yesterday after lasix  was held.  He reports significantly less dizziness today but continues to feel generally weak, not at baseline.  Feels overall slow improvement since admission.  Having intermittent dysuria which is improved from prior.  Had a BM this AM that was loose.  He denies shortness of breath   Physical Exam: Vitals:   01/29/24 1435 01/29/24 1500 01/30/24 1100 01/30/24 1156  BP: 109/70 (!) 102/57 (!) 161/80 (!) 149/74  Pulse: 99 78 75 77  Resp:  20 20   Temp:   98.3 F (36.8 C)   TempSrc:   Oral   SpO2:  98% 97%   Weight:   84.4 kg   Height:       General exam: awake, alert, no acute distress HEENT: atraumatic, clear conjunctiva, anicteric sclera, hearing grossly normal  Respiratory system: CTAB but diminished, no wheezes or rhonchi, normal respiratory effort at rest on room air. Cardiovascular system: normal S1/S2, RRR, no peripheral edema.   Gastrointestinal system: soft, NT, ND Central nervous system: A&O x 3. no gross focal neurologic deficits, normal speech Extremities: moves all , no edema, normal tone Skin: dry, intact, normal temperature Psychiatry: normal mood,  congruent affect, judgement and insight appear normal    Data Reviewed:  Notable labs -- CMP today notable only for bicarb 21, Ca 8.4 Hbg stable 11.6 >> 11.1 Platelets declining 142 >> 113 >> 101k  Family Communication: wife present in the home during virtual encounter  Disposition: Status is: Inpatient Remains inpatient appropriate because: still +orthostatic BP's today giving additional IV fluids    Planned Discharge Destination: Home with Home Health    Time spent: 42 minutes  Author: Burnard DELENA Cunning, DO 01/30/2024 3:50  PM  For on call review [email or web address].

## 2024-01-30 NOTE — Progress Notes (Signed)
 Patient called for eta of provider and team virtual visit.

## 2024-01-30 NOTE — Plan of Care (Signed)
  Problem: Education: Goal: Knowledge of General Education information will improve Description: Including pain rating scale, medication(s)/side effects and non-pharmacologic comfort measures Outcome: Progressing   Problem: Health Behavior/Discharge Planning: Goal: Ability to manage health-related needs will improve Outcome: Progressing   Problem: Clinical Measurements: Goal: Ability to maintain clinical measurements within normal limits will improve Outcome: Progressing Goal: Diagnostic test results will improve Outcome: Progressing Goal: Respiratory complications will improve Outcome: Progressing   Problem: Activity: Goal: Risk for activity intolerance will decrease Outcome: Progressing   Problem: Nutrition: Goal: Adequate nutrition will be maintained Outcome: Progressing   Problem: Elimination: Goal: Will not experience complications related to bowel motility Outcome: Progressing Goal: Will not experience complications related to urinary retention Outcome: Progressing   Problem: Pain Managment: Goal: General experience of comfort will improve and/or be controlled Outcome: Progressing   Problem: Safety: Goal: Ability to remain free from injury will improve Outcome: Progressing   Problem: Clinical Measurements: Goal: Ability to maintain a body temperature in the normal range will improve Outcome: Progressing   Problem: Respiratory: Goal: Ability to maintain adequate ventilation will improve Outcome: Progressing   Problem: Activity: Goal: Capacity to carry out activities will improve Outcome: Progressing   Problem: Cardiac: Goal: Ability to achieve and maintain adequate cardiopulmonary perfusion will improve Outcome: Progressing

## 2024-01-30 NOTE — Progress Notes (Signed)
 0900 phone call Pt did not answer virtual. Spoke to patient and wife they report he is feeling much better this am compared to yesterday. Informed them their requests for PT referral and Home health had been share with provider and will keep them informed when medic will be arriving.

## 2024-01-30 NOTE — Progress Notes (Signed)
 1150 on virtual call with provider, medic, and virtual RN orthostatic BP taken Sitting 155/89. P 83, Standing dropped 108/72/, < 148/74 P 77.  Patient denies dizziness endorses generalized weakness.  RN cosigned for celebrex   200mg  per MAR. IMM letter given to patient.  Patient getting 250L bolus and orthostatics for later visit. Patient and spouse report dizziness was present prior to this admission when patient was being given immunotherapies to treat CA.  Wound care for buttocks completed and photos taken by medic. Continuing to monitor orthostatic pressures, labs taken depending on trend discharge possible for 8/8. Patient and spouse endorse patient is doing much better today.  Patient to virtually meet with PT at 1500.

## 2024-01-30 NOTE — Progress Notes (Signed)
 Called patient 2x to set up PT meet but wife on call and he wanted spouse on virtual meeting PT will contact in the morning.

## 2024-01-31 LAB — CBC
HCT: 32.5 % — ABNORMAL LOW (ref 39.0–52.0)
Hemoglobin: 10.6 g/dL — ABNORMAL LOW (ref 13.0–17.0)
MCH: 29 pg (ref 26.0–34.0)
MCHC: 32.6 g/dL (ref 30.0–36.0)
MCV: 88.8 fL (ref 80.0–100.0)
Platelets: 85 K/uL — ABNORMAL LOW (ref 150–400)
RBC: 3.66 MIL/uL — ABNORMAL LOW (ref 4.22–5.81)
RDW: 15.4 % (ref 11.5–15.5)
WBC: 5.3 K/uL (ref 4.0–10.5)
nRBC: 0 % (ref 0.0–0.2)

## 2024-01-31 NOTE — Progress Notes (Signed)
 PT was assisted up to the bedroom for the night. PT ambulated with assistance from his cane. PT sat down on his bed without assistance.   [name] PICC line dressing was scheduled to be switched out today. However, due to lack of equipment it was not swapped out. APP was notified by RN Silvestre.

## 2024-01-31 NOTE — Progress Notes (Signed)
 9149 am virtual visit with patient and spouse. Spouse shared FJ was up most of the night because of 1930 NS bolus so he was feeling a little tired. They requested I call back at 1000-1030 with medic for am virtual.

## 2024-01-31 NOTE — Progress Notes (Signed)
 This Clinical research associate arrived at patients home at 11:57. This writer found patient sitting in his recliner. Patient was smiling, talking and greeted me. This Clinical research associate explained I was going to take his vital signs. Patient was agreeable. Patients vital signs were as follows: B/P-148/81 (sitting) P-81, 114/74 (standing) P-109, 163/81 (laying) P-82, R-18, SPO2-97% and patients weight 84.4kg. Patient ambulated to get on the scale and to his couch, with no help and no problems. This Clinical research associate assisted patient with removing his undergarments for wound care. Patient layed down on his couch for this. Patient rolled on his left side. This Clinical research associate took patch off of patients buttocks and took two photos. (Will upload in epic) This writer cleaned the wound with vasue cleanse, waited for area to dry, applied therahoney to the wound and then applied gerharqts but cream and applied meperlix. Patient sat up on the couch, helped him with getting his clothes back on. Patient sat there and got his bearings and proceeded to stand up and walk back to his recliner. Patient had no problem ambulating. Patient sat in his recliner and nurse phoned in. -------------     Chad Moreno. Franchot, EMT-B

## 2024-01-31 NOTE — Progress Notes (Addendum)
 PT Cancellation Note  Patient Details Name: Chad Moreno MRN: [id number] DOB: December 14, 1940   Cancelled Treatment:    Reason Eval/Treat Not Completed: Other (comment)  Attempted virtual visit for physical therapy follow-up. After completing nursing assessment, wife had to take a work call and pt is unavailable at this time. Will try to schedule another attempt this afternoon.   Addendum 8/8 1627: Attempted follow-up ~1500. Unable to obtain zoom link for afternoon follow-up. Please reach out via secure chat if pt will be available tomorrow and a link can be provided for physical therapy visit.   Thanks,  Chad Moreno, PT, DPT [facility]habilitation Services Physical Therapist Office: [phone] Website: [email or web address]   Chad Moreno 01/31/2024, 9:09 AM

## 2024-01-31 NOTE — Progress Notes (Addendum)
 Hospital at [facility] Progress Note   Patient: Chad Moreno FMW:[id number] DOB: 1940/08/04 DOA: 01/22/2024     8 DOS: the patient was seen and examined on 01/31/2024    [facility] Encounter Patient identified as Chad Moreno DOB Jun 14, 1941  I am located in [place] Medic Norman present in the home and performed the physical exam and assessment.   Brief hospital course:  83 y.o. male with medical history significant of metastatic small cell lung cancer status post SBRT and currently on immunotherapy, chronic thrombocytopenia, hypertension, hyperlipidemia, gout, GERD. Recent hospital admission 7/7-7/16 for SBO, GI bleed from cecal colitis/diverticulosis, ESBL UTI, and AKI. Patient presented to the ED via EMS for evaluation of acute onset shortness of breath/respiratory distress. He was found to have findings concerning for  fluid overload, was placed on diuretics and admitted to the hospital. His blood cultures were positive for ESBL E. Coli, ID were consulted and recommended 7 days of antibiotics. TTE showed newly reduced EF, cardiology was consulted and recommended medical therapy. Admitted to [facility] program on 8/1.    Patient completed 7 day course of IV antibiotics.  Clinical course complicated by progressive generalized weakness, orthostatic hypotension and dizziness with near-syncope.  Patient requiring additional IV fluids on 8/6 with BP drop to 102/60 when up to bathroom, and pt too dizzy to stand more than 20 seconds.  8/7 Orthostatic symptoms improving after IV hydration and holding diuretics.  BP's still dropping today, however (149/74 >> 108/72 on AM orthostatic vitals), but with less dizziness.   Assessment and Plan:  ESBL E. coli bacteremia - Blood culture on 7/31 grew 1/4 bottles of ESBL E. coli.  Started on meropenem   Infectious disease was consulted, recommend 7 total days of antibiotic (end date 8/6).  Changed to ertapenem  in anticipation to transfer  to [facility] at [facility] program. Repeat bcx obtained on 7/31 final result NO GROWTH. He complained of dysuria  8/2. UA showed cloudy urine with moderate leukocytes >50 wbc.reflex culture was ordered.How ever this has since then resolved and frequency has improved after restarting Rapaflo  and a fluid bolus. --Completed final dose ertapenem  8/6   Acute systolic heart failure- Presented with hypoxia. Treated with IV Lasix  40 mg BID, initially diuresed well and was net -5 L, down 2 lbs.  TTE on 8/1 showed EF 30-35%, LV RWMA, mild LVH, mildly reduced RV function, normal IVC.  Cardiology was consulted. Cardiomyopathy was thought to be likely secondary to his immunotherapy, stress CM, or ischemia. Patient deferred any invasive ischemic evaluation but was agreeable to medical management.  Started on Lasix  40 mg daily, Entresto  24-26 mg twice daily and spironolactone  12.5 mg daily with plan for repeat TTE in 3 months. PLAN: --Entresto  stopped due to causing hypotension, dizziness --Hold Lasix  and Aldactone  -- pt orthostatic, clinically showing signs of dehydration.  These were also held on 8/2, 8/3 for weakness, poor PO intake, clinically dry --Will plan to d/c on DAILY PRN dose Lasix  --GDMT currently limited by soft BP and orthostatic symptoms --Will need close Cardiology follow up  Orthostatic hypotension / dizzyness / near-syncope Suspect over-diuresis vs poor PO intake with addition of diuretics, not maintaining adequate fluid balance. Previously given 500 cc NS on 8/3 8/6 -- pt dizzy and near-syncopal when up to bathroom this afternoon. BP was 102/60. 8/6 given 250 cc NS bolus, followed by 50 cc/hr 8/7 AM - still +orthostatic vitals this AM, less dizziness - improving.  Given another 250 cc IV fluids  8/8 - generally weak, very limited exertional tolerance, but dizziness improved --Hold Lasix  20 mg daily (prior dose 40 mg) --Hold Aldactone  12.5 mg daily --Daily Orthostatic vitals to monitor --Fall  precautions    Acute hypoxic respiratory failure, resolved- Initially thought to be due to pneumonia and fluid overload, but mostly due to CHF.  Initially required BiPAP.  Was started on IV doxycycline  and IV lasix . Weaned to room air on 8/1. Respiratory status much improved and at baseline. Repeat CXR 8/1 showed improvement in aeration. Doxycycline  discontinued on 8/1. Note -- He drops his saturation at night when he sleeps.wife reports he snores.dw his pcp ASKED HIS WIFE TO CALL THE OFFICE TO SET UP A SLEEP STUDY.   Metastatic small cell lung cancer- Metastatic non-small cell lung cancer, adenocarcinoma, with BRAF V600E mutation and PD-L1 expression of 1%. Diagnosed in May 2024 with malignant pleural effusion noted in December 2024. Currently on encorafenib  and binimetinib  since July 22, 2023. He reports recent diarrhea from current immunotherapy.  He had virtual visit with Oncology Dr Roderic 8/5 planning on restarting chemo 8/8 after dc from [facility] -- which was planned 8/7 but may be later due to low BP's.  Generalized weakness - multifactorial with lung cancer, cancer treatment, heart failure, orthostatic BP changes, and probably underlying deconditioning all contributing. --Home health PT ordered --[facility] team to assess and assist with trial getting upstairs  --Fall precautions   Anemia likely multifactorial - improved, stable. Iron  deficiency, malignancy,chronic disease. Iron  panel indicates Iron  deficiency. --Continue every other day iron  supplement --Added Vitamin C  for better absorption  Thrombocytopenia - platelets 163 >> 142 >> 113 >> 101k today.  ?Due to cancer tx? --Monitor CBC --Hold Lovenox  --Staff msg sent to Dr. [name] - they will closely monitor CBC after d/c   Troponin elevation- Troponin 27->124. No chest pain or ischemic changes on EKG. Secondary to demand ischemia.    Hypokalemia- resolved   Unstageable pressure injury to R/L buttocks, POA- Wound care saw him 8/2  recommends: Cleanse with Vashe, pat dry the peri-wound skin, not rinse. Apply Santyl  daily on the wound bed. Cover with foam dressing, change every 3 days or PRN. Pressure redistribution pad for chair. --Image in Media from 01/28/24 Medic visit --Home health RN ordered to assist with wound care and monitoring post-discharge  Nausea- resolved   BPH - Continue Rapaflo         Subjective: Pt seen virtually this AM with Medic in the home, wife present. Pt reports overall feeling better, but remains very weak.  He has not been able to attempt getting upstairs to his bed yet, would like to get back to sleeping in his bed.  He denies dizziness with ambulating, just states legs are very weak.  No acute complaints otherwise.   Physical Exam: Vitals:   01/30/24 1100 01/30/24 1156 01/30/24 1900 01/31/24 1200  BP: (!) 161/80 (!) 149/74    Pulse: 75 77 71   Resp: 20  15   Temp: 98.3 F (36.8 C)     TempSrc: Oral     SpO2: 97%  98% 97%  Weight: 84.4 kg   84.4 kg  Height:       General exam: awake, alert, no acute distress HEENT: atraumatic, clear conjunctiva, anicteric sclera, hearing grossly normal  Respiratory system: CTAB but diminished, no wheezes or rhonchi, normal respiratory effort at rest on room air. Cardiovascular system: normal S1/S2, RRR, no peripheral edema.   Gastrointestinal system: soft, NT, ND Central nervous system: A&O  x 3. no gross focal neurologic deficits, normal speech Extremities: moves all , no edema, normal tone Skin: dry, intact, normal temperature Psychiatry: normal mood, congruent affect, judgement and insight appear normal    Data Reviewed:  Today's labs pending  Notable labs 8/7 -- CMP today notable only for bicarb 21, Ca 8.4 Hbg stable 11.6 >> 11.1 Platelets declining 142 >> 113 >> 101k  Family Communication: wife present in the home during virtual encounter  Disposition: Status is: Inpatient Remains inpatient appropriate because: persistent  generalized weakness beyond baseline, still +orthostatic vitals signs     Planned Discharge Destination: Home with Home Health    Time spent: 42 minutes  Author: Burnard DELENA Cunning, DO 01/31/2024 1:42 PM  For on call review [email or web address].

## 2024-01-31 NOTE — Progress Notes (Addendum)
 ADVANCED HF CLINIC CONSULT NOTE PCP: [name], MD Primary Cardiologist: Dr. [place]  HPI: 83 y.o. male with a hx of metastatic nSCLC currently on immunotherapy, chronic thrombocytopenia, histoplasmosis, HTN, HLD, and newly diagnosed systolic heart failure (12/2023).  Echo 06/2023 showed EF 55%, no RWMAs.  s/p SRB [place] completed 10/2022; onencorafenib and binimetinib  started on 06/2023   Admitted 12/30/23 for SBO/GI bleed. General surgery consulted, recommended conservative management. GI bleed felt to be 2/2 to cecal colitis/ diverticulosis, received PRBCs and IV PPI. Plan for outpatient GI EGD/colonoscopy. Course c/b ESBL UTI, treated with abx. He was discharged home.    Re-admitted 01/17/24 with dyspnea initially, required BiPap. Echo showed EF 30%, RWMAs, mild LVH, RV mildly reduced. ? Takotsubo cardiomyopathy vs LAD infarct vs CM from immunotherapy. Immunotherapy held. Cardiology consulted. HsTroponin mildly elevated but flat, ECG without acute ST changes. Blood Cx + ESBL E. coli, ID consulted. Started on abx, diuresed with IV lasix  and GDMT titrated. Patient does not want invasive procedures. GDMT limited by low BP and orthostasis. He was discharged home, weight 188 lbs.   Follow up with Dr. [name] 01/28/24, encorafenib  and binimetinib  restarted at reduced doses.  Today he returns for post hospital HF follow up with his wife. Overall feeling fine. No SOB with activity or ADLs. Feels weak from hospitalization but can walk up 2 flights of stairs in his home without dyspnea. Walks with a cane for balance when outside of the home. Denies palpitations, abnormal bleeding, CP, dizziness, edema, or PND/Orthopnea. Appetite ok. Weight at home 188 pounds. Taking all medications, has not needed Lasix . Has sacral wound. Has HH PT starting tomorrow. Has HH RN x 3 weeks. Wants to get back to playing golf.  Family Hx: no CHF or sudden cardiac death Social Hx: retired x 18 years, married, 2 children,  several grands, 1 great-grand due soon, no tobacco, drugs, rare beer on weekends  Cardiac Studies - Echo 7/25: EF 30-35%, mild LVH, RWMAs, mildly reduced RV, moderate L pleural effusion  - Ltd echo 1/25: EF 55%, G1DD, normal RV  Past Medical History:  Diagnosis Date   Acute hypoxemic respiratory failure (HCC) 01/23/2024   Acute systolic CHF (congestive heart failure) (HCC) 01/24/2024   Age-related cataract of left eye    Amblyopia, right eye    Arthritis    Bacteremia due to Escherichia coli 02/01/2024   Cancer (HCC)    CAP (community acquired pneumonia) 01/23/2024   Dysuria    ED (erectile dysfunction)    Elevated PSA    Epidermal cyst    Family history of prostate cancer    Gout    Hip pain    Histoplasmosis    Hyperlipidemia    Hypermetropia, left eye    Hypertension    Hypokalemia 01/25/2024   Impaired glucose tolerance    Joint pain    Left shoulder pain    Lower back pain    Melanocytic nevi, unspecified    Nummular dermatitis    Ocular hypertension    Palmar fascial fibromatosis    Prostate cancer screening    Regular astigmatism, right eye    Senile nuclear sclerosis    SIRS (systemic inflammatory response syndrome) (HCC) 01/23/2024   Current Outpatient Medications  Medication Sig Dispense Refill   ascorbic acid  (VITAMIN C ) 500 MG tablet Take 1 tablet (500 mg total) by mouth daily.     B Complex Vitamins (VITAMIN-B COMPLEX) TABS Take 1 tablet by mouth daily with breakfast.     [  Paused] binimetinib  (MEKTOVI ) 15 MG tablet Take 3 tablets (45 mg total) by mouth 2 (two) times daily. 180 tablet 3   calcium carbonate (OSCAL) 1500 (600 Ca) MG TABS tablet Take 1,200 mg by mouth 2 (two) times daily with a meal.     celecoxib  (CELEBREX ) 200 MG capsule TAKE 1 CAPSULE BY MOUTH 2 TIMES A DAY 60 capsule 3   cyanocobalamin (VITAMIN B12) 1000 MCG tablet Take 1,000 mcg by mouth daily.     [Paused] encorafenib  (BRAFTOVI ) 75 MG capsule Take 6 capsules (450 mg total) by mouth  daily. 180 capsule 3   feeding supplement, GLUCERNA SHAKE, (GLUCERNA SHAKE) LIQD Take 237 mLs by mouth 3 (three) times daily between meals.     ferrous sulfate  325 (65 FE) MG EC tablet Take 1 tablet (325 mg total) by mouth daily as needed. (Patient taking differently: Take 325 mg by mouth every other day.)     furosemide  (LASIX ) 20 MG tablet Take 1 tablet (20 mg total) by mouth daily as needed. For weight gain or swelling 30 tablet 11   leptospermum manuka honey (MEDIHONEY) PSTE paste Apply 1 Application topically daily. 44 mL 1   loperamide  (IMODIUM  A-D) 2 MG tablet Take 1 tablet (2 mg total) by mouth 4 (four) times daily as needed for diarrhea or loose stools. 30 tablet 0   losartan  (COZAAR ) 25 MG tablet Take 1 tablet (25 mg total) by mouth daily. 90 tablet 2   Nystatin (GERHARDT'S BUTT CREAM) CREA Apply 1 Application topically 2 (two) times daily. 1 each 1   ondansetron  (ZOFRAN -ODT) 4 MG disintegrating tablet Take 1 tablet (4 mg total) by mouth every 8 (eight) hours as needed for nausea or vomiting. 20 tablet 0   pantoprazole  (PROTONIX ) 40 MG tablet Take 1 tablet (40 mg total) by mouth 2 (two) times daily. 60 tablet 0   POTASSIUM PO Take 1 tablet by mouth daily with breakfast.     Probiotic Product (PROBIOTIC PEARLS ADVANTAGE PO) Take 1 capsule by mouth in the morning.     protein supplement shake (PREMIER PROTEIN) LIQD Take 11 oz by mouth See admin instructions. Drink one to two cartons daily     silodosin  (RAPAFLO ) 8 MG CAPS capsule Take 8 mg by mouth daily.     Wound Cleansers (VASHE CLEANSING) SOLN Apply 1 application  topically as needed. With dressing changes 250 mL 1   No current facility-administered medications for this encounter.   Allergies  Allergen Reactions   Sulfa Antibiotics Anaphylaxis   Social History   Socioeconomic History   Marital status: Married    Spouse name: Barnie   Number of children: Not on file   Years of education: Not on file   Highest education level:  Not on file  Occupational History   Not on file  Tobacco Use   Smoking status: Former    Current packs/day: 0.00    Average packs/day: 0.5 packs/day for 10.0 years (5.0 ttl pk-yrs)    Types: Cigarettes    Start date: 74    Quit date: 9    Years since quitting: 44.6   Smokeless tobacco: Never  Vaping Use   Vaping status: Never Used  Substance and Sexual Activity   Alcohol  use: Yes    Comment: rare   Drug use: Never   Sexual activity: Not on file  Other Topics Concern   Not on file  Social History Narrative   Not on file   Social Drivers of Health  Financial Resource Strain: Not on file  Food Insecurity: No Food Insecurity (01/23/2024)   Hunger Vital Sign    Worried About Running Out of Food in the Last Year: Never true    Ran Out of Food in the Last Year: Never true  Transportation Needs: No Transportation Needs (01/23/2024)   PRAPARE - Administrator, Civil Service (Medical): No    Lack of Transportation (Non-Medical): No  Physical Activity: Not on file  Stress: Not on file  Social Connections: Socially Integrated (01/23/2024)   Social Connection and Isolation Panel    Frequency of Communication with Friends and Family: More than three times a week    Frequency of Social Gatherings with Friends and Family: Once a week    Attends Religious Services: More than 4 times per year    Active Member of [employer] or Organizations: Yes    Attends Engineer, structural: More than 4 times per year    Marital Status: Married  Catering manager Violence: Not At Risk (01/23/2024)   Humiliation, Afraid, Rape, and Kick questionnaire    Fear of Current or Ex-Partner: No    Emotionally Abused: No    Physically Abused: No    Sexually Abused: No    Family History  Problem Relation Age of Onset   Prostate cancer Father    Wt Readings from Last 3 Encounters:  02/05/24 86.2 kg (190 lb)  02/01/24 (P) 85.5 kg (188 lb 7.9 oz)  01/20/24 90.7 kg (200 lb)   BP (!)  154/86   Pulse 73   Ht 6' 1 (1.854 m)   Wt 86.2 kg (190 lb)   SpO2 99%   BMI 25.07 kg/m   PHYSICAL EXAM: General:  NAD. No resp difficulty, walked into clinic with cane, elderly HEENT: +hoarse Neck: Supple. No JVD. Cor: Regular rate & rhythm. No rubs, gallops or murmurs. Lungs: Clear Abdomen: Soft, nontender, nondistended.  Extremities: No cyanosis, clubbing, rash, edema Neuro: Alert & oriented x 3, moves all 4 extremities w/o difficulty. Affect pleasant.  ECG (personally reviewed): NSR, anterior TWIs  ReDs reading: 25%, normal  ASSESSMENT & PLAN: Chronic Systolic Heart Failure - newly diagnosed 7/25 - Echo 1/25: EF 50-55%. Started targeted therapy with encorafenib  and binimetinib  06/2023.  - Echo 7/25: EF 30%, RWMAs - ? Stress-induced CM vs immunotherapy CM vs LAD infarct. No CP, HsTroponins flat. Patient not interested in invasive work up which is very reasonable. Defer cMRI & ischemic work up. - NYHA I-II, volume stable, ReDs 25% (normal 25-35%) - Restart losartan  25 mg daily (dizzy on Entresto ) - Continue Lasix  20 mg PRN - Recommend adding spiro and Coreg/Toprol at next visits. - Avoid SGLT2i with recent UTIs, bacteremia & sacral wound. - Labs today. - Recommend repeating echo in 3 months to look for EF improvement. - Not an advanced therapies candidate. Likely not ICD candidate with age and recent bacteremia, should EF not improve. - When graduated from [facility], consider CR. He is interested in this.  2. NSC Lung Ca - (T1c, N3, M1b)  - RLL and LUL - s/p SBRT completed 10/2022 - Now with recurrent mets to bilateral supraclavicular, subpectoral, left axillary as well as upper abdominal lymphadenopathy and malignant left pleural effusion in 12/24  - On immunotherapy with BRAF and MEK inhibitors, encorafenib  and binimetinib   - I messaged Dr. [name], concern encorafenib  and binimetinib  responsible for his new HF. He agrees and OK to hold meds, but not sure we should hold  meds x 3 months to see if EF improves. I discussed this with patient & his wife today. Dr [name] to discuss with them at next visit, risks vs benefits.   3. Bacteremia - BCx + ESBL E. Coli - Completed 7 days of IV abx  Follow up PRN.  Refer back to [facility]. Will call and see if we can get an appt in next couple of weeks.  Harlene Gainer, FNP-BC 02/05/24

## 2024-01-31 NOTE — Progress Notes (Signed)
 1936-Patient alerting on Current Health monitor for needed help. [facility] field team at home. Contact via video call. Plan discussed for mobility assistance upstairs to bed for the night.      2128- RN introduction, Patient identifiers completed, plan for overnight monitoring, medication administration assistance, [facility] contact information reviewed. Patient and Spouse agreed to a video call for PO medication assistance and assessment now.    2130- Patient contact via Video call. Spouse at side. Patient identifiers review  patient A&O x4. Patient resting recliner chair. Patients denies pain, discomfort, dizziness or health concerns. Education on safety and S/Sx  of dizziness and hypotension reviewed.  Assessment and medication administration assistance completed. Overnight monitoring plan and [facility] contact information, when to call RN or for emergency needs per Code Status, and plan for AM medications reviewed. Per Patient and spouse, no other questions or concerns at the time of video call. Patient and spouse informed [facility] field team would still be arriving per planned time. Patient and spouse encouraged to call as needed and will continue to be monitored via current health monitoring.

## 2024-01-31 NOTE — Progress Notes (Signed)
 Paramedic Chad Moreno arrived on scene to find the PT sitting upright in his recliner. No obvious distress or trauma noted. PT is Aox4 at his baseline. PT stated that he was feeling better than yesterday but still a little weak.   Physical exam showed negative JVD or tracheal deviation. Chest expansion is equal with non-labored breathing. PT denied any SOB or difficulty breathing. ABD is soft, non-tender. PT stated that he had some loose stool in the morning but it has started to become more solid. PT stated that he was having intermittent dysuria but it has gotten better over the past several days. Urine is clear and yellow in appearance. Negative pedal or peripheral edema noted.Orthostatic vitals were obtained and documented accordingly.   Medications were administered as prescribed. PT denied having any other questions and was reminded that if he needed anything to call the virtual RN.

## 2024-01-31 NOTE — Progress Notes (Signed)
 Paramedic Wonda Goodgame arrived on scene to find the PT sitting upright in his recliner, no obvious distress or trauma noted. PT is Aox4. No obvious distress or trauma noted. PT stated that he is feeling better than yesterday, he is hesitant to walk up the stairs but he is going to try it.   [name] vitals were obtained and documented in vitals section of chart. Physical exam showed negative DCPABTLS to the head, neck, or face. PERRLA. Negative JVD or tracheal deviation. Chest expansion is equal with non-labored breathing. ABD is soft, non-tender in all quadrants. [name] LBM was 01/31/24 and was solid in consistency. [name] urine is yellow in appearance, no foul smell noted. Negative pedal or peripheral edema noted.   PT wanted to attempt to walk up the stairs. He used his cane to walk from the living room to the stair case and used the side railings for stability. [name] SPO2 and HR were monitored continuously during the movements. PT walked up the stairs and sat down to catch his breath at the top of the stairs. He walked down the stairs and then continued to walk with assistance of his cane and sat down in his recliner. PT denied any SOB or dizziness while walking up and down the stairs.   PT denied having any other questions or concerns at the time. He requested that staff come back at 2200 for his night time medications and to help him up to his bed for the night.

## 2024-01-31 NOTE — Plan of Care (Signed)
  Problem: Clinical Measurements: Goal: Cardiovascular complication will be avoided Outcome: Progressing   Problem: Clinical Measurements: Goal: Will remain free from infection Description: IV Abx Outcome: Progressing   Problem: Nutrition: Goal: Adequate nutrition will be maintained Outcome: Progressing    Problem: Cardiac: Goal: Ability to achieve and maintain adequate cardiopulmonary perfusion will improve Outcome: Progressing   Problem: Pain Managment: Goal: General experience of comfort will improve and/or be controlled Outcome: Progressing    Problem: Safety: Goal: Ability to remain free from injury will improve Outcome: Progressing

## 2024-01-31 NOTE — Progress Notes (Incomplete)
 Arrived to the patients home to find him sitting upright in the recliner, in the living room. The patient was alert and oriented throughout encounter.   Upon assessment the patient, the patient has no complaints with no objective findings on physical exam.   IMM was signed by the patients wife. Of note, the wife request the patient not be discharged until the end of the weekend.   Meds were given, virtual nurse visit completed, labs taken. The patient has no other concerns at this time.

## 2024-01-31 NOTE — Progress Notes (Signed)
 1240 virtual visit Medic, provider, and Tax inspector. Spouse present to discuss possible discharge. All scheduled medication given well tolerated patient still complaining of generalized weakness. Provider discussed possibly another day with us  waiting on labs and ambulating O2 check.   Medic was dispensed ferrous gluconate 324mg  but MAR order reflects ferrous sulfate  325mg . Provider allowed substitution.

## 2024-02-01 ENCOUNTER — Encounter (HOSPITAL_COMMUNITY): Payer: Self-pay | Admitting: Internal Medicine

## 2024-02-01 DIAGNOSIS — C3492 Malignant neoplasm of unspecified part of left bronchus or lung: Secondary | ICD-10-CM

## 2024-02-01 DIAGNOSIS — L89159 Pressure ulcer of sacral region, unspecified stage: Secondary | ICD-10-CM

## 2024-02-01 DIAGNOSIS — B9620 Unspecified Escherichia coli [E. coli] as the cause of diseases classified elsewhere: Secondary | ICD-10-CM | POA: Diagnosis present

## 2024-02-01 DIAGNOSIS — D509 Iron deficiency anemia, unspecified: Secondary | ICD-10-CM

## 2024-02-01 MED ORDER — GLUCERNA SHAKE PO LIQD: 237.0000 mL | Freq: Three times a day (TID) | ORAL | Status: DC

## 2024-02-01 MED ORDER — GERHARDT'S BUTT CREAM: 1.0000 | TOPICAL_CREAM | Freq: Two times a day (BID) | CUTANEOUS | 1 refills | Status: DC

## 2024-02-01 MED ORDER — MEDIHONEY WOUND/BURN DRESSING EX PSTE: 1.0000 | PASTE | Freq: Every day | CUTANEOUS | 1 refills | Status: DC

## 2024-02-01 MED ORDER — ASCORBIC ACID 500 MG PO TABS: 500.0000 mg | ORAL_TABLET | Freq: Every day | ORAL | Status: AC

## 2024-02-01 MED ORDER — VASHE CLEANSING EX SOLN: 1.0000 | CUTANEOUS | 1 refills | Status: DC | PRN

## 2024-02-01 MED ORDER — FUROSEMIDE 20 MG PO TABS: 20.0000 mg | ORAL_TABLET | Freq: Every day | ORAL | 11 refills | Status: AC | PRN

## 2024-02-01 NOTE — Discharge Instructions (Addendum)
 Wound status as of 01/25/2024 when seen by inpatient wound care RN---  Wound type: PI stage 3 on buttocks.  Measurement: aprox. 3 cm x 2.5 cm x 0.1 cm on the left. 3.2 cm x 1.5 cm x 0.1 cm on the right.  Wound bed: 100% yellow slough   Wound care instructions:  Dressing procedure/placement/frequency:  Cleanse with Vashe #[id number]  Pat dry the peri-wound skin, not rinse.  Apply Santyl  daily on the wound bed. (If Santyl  is not available, apply Medihoney) Cover with foam dressing, change every 3 days or PRN if soiled.

## 2024-02-01 NOTE — Progress Notes (Signed)
 Patient remained stable overnight, no current health alerts or need for RN outreach.

## 2024-02-01 NOTE — Discharge Summary (Signed)
 Physician Discharge Summary   Patient: Chad Moreno MRN: [id number] DOB: Sep 09, 1940  Admit date:     01/22/2024  Discharge date: 02/01/24  Discharge Physician: Burnard DELENA Cunning   PCP: [name], MD    [facility] Encounter Patient identified as Chad Moreno DOB Sep 15, 1940  I am located in [place] Medic Lauraine present in the home and performed the physical exam and assessment.   Recommendations at discharge:    Follow up with Oncology within 1 week Repeat CBC within 1 week - follow thrombocytopenia Follow up with Primary Care Repeat CMP in 1-2 weeks Continue wound care to sacral area  Home Health to follow for PT and RN services  Discharge Diagnoses: Active Problems:   Non-small cell cancer of left lung (HCC)   Metastasis to bone (HCC)   Iron  deficiency anemia, unspecified   Elevated troponin   CHF exacerbation (HCC)   Pressure injury of skin   Anemia associated with chemotherapy  Resolved Problems:   Acute hypoxemic respiratory failure (HCC)   CAP (community acquired pneumonia)   SIRS (systemic inflammatory response syndrome) (HCC)   Acute systolic CHF (congestive heart failure) (HCC)   Hypokalemia   Bacteremia due to Escherichia coli  Hospital Course:  83 y.o. male with medical history significant of metastatic small cell lung cancer status post SBRT and currently on immunotherapy, chronic thrombocytopenia, hypertension, hyperlipidemia, gout, GERD. Recent hospital admission 7/7-7/16 for SBO, GI bleed from cecal colitis/diverticulosis, ESBL UTI, and AKI. Patient presented to the ED via EMS for evaluation of acute onset shortness of breath/respiratory distress. He was found to have findings concerning for  fluid overload, was placed on diuretics and admitted to the hospital. His blood cultures were positive for ESBL E. Coli, ID were consulted and recommended 7 days of antibiotics. TTE showed newly reduced EF, cardiology was consulted and recommended  medical therapy. Ad[facility] to [facility] on 8/1.    Patient completed 7 day course of IV antibiotics.   Clinical course complicated by progressive generalized weakness, orthostatic hypotension and dizziness with near-syncope.  Patient requiring additional IV fluids on 8/6 with BP drop to 102/60 when up to bathroom, and pt too dizzy to stand more than 20 seconds.   8/7 Orthostatic symptoms improving after IV hydration and holding diuretics.  BP's still dropping today, however (149/74 >> 108/72 on AM orthostatic vitals), but with less dizziness.  8/8 -- Persistent generalized weakness and orthostatic BP's but lows are not as low as prior to IV fluids, and dizziness resolved.  8/9 --- pt was able to get up and down stairs to sleep in bedroom overnight.  Tolerated fairly well.  Feeling much better overall today.  Patient is medically stable for discharge this afternoon.  We discussed close follow up with his Oncologist for repeat CBC this week.     Assessment and Plan:  ESBL E. coli bacteremia - Blood culture on 7/31 grew 1/4 bottles of ESBL E. coli.  Started on meropenem   Infectious disease was consulted, recommend 7 total days of antibiotic (end date 8/6).  Changed to ertapenem  in anticipation to transfer to [facility] program. Repeat bcx obtained on 7/31 final result NO GROWTH. He complained of dysuria  8/2. UA showed cloudy urine with moderate leukocytes >50 wbc.reflex culture was ordered.How ever this has since then resolved and frequency has improved after restarting Rapaflo  and a fluid bolus. --Completed final dose ertapenem  8/6   Acute systolic heart failure- Presented with hypoxia. Treated with  IV Lasix  40 mg BID, initially diuresed well and was net -5 L, down 2 lbs.  TTE on 8/1 showed EF 30-35%, LV RWMA, mild LVH, mildly reduced RV function, normal IVC.  Cardiology was consulted. Cardiomyopathy was thought to be likely secondary to his immunotherapy, stress CM, or  ischemia. Patient deferred any invasive ischemic evaluation but was agreeable to medical management.  Started on Lasix  40 mg daily, Entresto  24-26 mg twice daily and spironolactone  12.5 mg daily with plan for repeat TTE in 3 months. PLAN: --Entresto  stopped due to causing hypotension, dizziness --Holding Lasix  and Aldactone  -- pt orthostatic, clinically showed clinical signs of dehydration.  These were also held on 8/2, 8/3 for weakness, poor PO intake, clinically dry --pt required additional IV fluids on 8/6 and 8/7 --d/c on LASIX  20 MG PO DAILY PRN given severity of his orthostatic symptoms --GDMT currently limited by soft BP and orthostatic symptoms --Will need close Cardiology follow up   Orthostatic hypotension / dizzyness / near-syncope Suspect over-diuresis vs poor PO intake with addition of diuretics, not maintaining adequate fluid balance. Previously given 500 cc NS on 8/3 8/6 -- pt dizzy and near-syncopal when up to bathroom this afternoon. BP was 102/60. 8/6 given 250 cc NS bolus, followed by 50 cc/hr 8/7 AM - still +orthostatic vitals this AM, less dizziness - improving.  Given another 250 cc IV fluids 8/8 - generally weak, very limited exertional tolerance, but dizziness improved --Hold Lasix  20 mg daily (prior dose 40 mg) --Hold Aldactone  12.5 mg daily --Daily Orthostatic vitals to monitor --Fall precautionS     Acute hypoxic respiratory failure, resolved- Initially thought to be due to pneumonia and fluid overload, but mostly due to CHF.  Initially required BiPAP.  Was started on IV doxycycline  and IV lasix . Weaned to room air on 8/1. Respiratory status much improved and at baseline. Repeat CXR 8/1 showed improvement in aeration. Doxycycline  discontinued on 8/1. Note -- He drops his saturation at night when he sleeps.wife reports he snores.dw his pcp ASKED HIS WIFE TO CALL THE OFFICE TO SET UP A SLEEP STUDY.   Metastatic small cell lung cancer- Metastatic non-small cell  lung cancer, adenocarcinoma, with BRAF V600E mutation and PD-L1 expression of 1%. Diagnosed in May 2024 with malignant pleural effusion noted in December 2024. Currently on encorafenib  and binimetinib  since July 22, 2023. He reports recent diarrhea from current immunotherapy.   He had virtual visit with Oncology Dr Roderic 8/5 planning on restarting chemo after dc from [facility] --Staff message sent to Dr [name] 8/7 regarding thrombocytopenia.  He replied this will be closely monitored at follow up.   Generalized weakness - multifactorial with lung cancer, cancer treatment, heart failure, orthostatic BP changes, and probably underlying deconditioning all contributing. --Home health PT ordered --Fall precautions    Anemia likely multifactorial - improved, stable. Iron  deficiency, malignancy,chronic disease. Iron  panel indicates Iron  deficiency. --Continue every other day iron  supplement --Added Vitamin C  for better absorption and wound healing   Thrombocytopenia - platelets 163 >> 142 >> 113 >> 101 >> 85 as of 01/31/24.  ?Due to cancer tx? --Monitor CBC closely in follow up --Hold Lovenox  --Staff msg sent to Dr. [name] - they will closely monitor CBC after d/c   Troponin elevation- Troponin 27->124. No chest pain or ischemic changes on EKG. Secondary to demand ischemia.    Hypokalemia- resolved   Unstageable pressure injury to R/L buttocks, POA- Wound care saw him 8/2 recommends: Cleanse with Vashe, pat dry the peri-wound skin,  not rinse. Apply Santyl  daily on the wound bed.  Cover with foam dressing, change every 3 days or PRN.  Pressure redistribution pad for chair. --Image in Media from 01/28/24 Medic visit --Home health RN ordered to assist with wound care and monitoring post-discharge   Nausea- resolved   BPH - Continue Rapaflo        Consultants: Cardiology Procedures performed: Echo Disposition: Home health Diet recommendation:  Cardiac diet   DISCHARGE  MEDICATION: Allergies as of 02/01/2024       Reactions   Sulfa Antibiotics Anaphylaxis        Medication List     PAUSE taking these medications    Braftovi  75 MG capsule Wait to take this until your doctor or other care provider tells you to start again. Generic drug: encorafenib  Take 6 capsules (450 mg total) by mouth daily.   Mektovi  15 MG tablet Wait to take this until your doctor or other care provider tells you to start again. Generic drug: binimetinib  Take 3 tablets (45 mg total) by mouth 2 (two) times daily.       STOP taking these medications    oxyCODONE -acetaminophen  5-325 MG tablet Commonly known as: Percocet       TAKE these medications    ascorbic acid  500 MG tablet Commonly known as: VITAMIN C  Take 1 tablet (500 mg total) by mouth daily. Start taking on: February 02, 2024   calcium carbonate 1500 (600 Ca) MG Tabs tablet Commonly known as: OSCAL Take 1,200 mg by mouth 2 (two) times daily with a meal.   celecoxib  200 MG capsule Commonly known as: CELEBREX  TAKE 1 CAPSULE BY MOUTH 2 TIMES A DAY   cyanocobalamin 1000 MCG tablet Commonly known as: VITAMIN B12 Take 1,000 mcg by mouth daily.   feeding supplement (GLUCERNA SHAKE) Liqd Take 237 mLs by mouth 3 (three) times daily between meals.   ferrous sulfate  325 (65 FE) MG EC tablet Take 1 tablet (325 mg total) by mouth daily as needed. What changed: when to take this   furosemide  20 MG tablet Commonly known as: Lasix  Take 1 tablet (20 mg total) by mouth daily as needed. For weight gain or swelling   Gerhardt's butt cream Crea Apply 1 Application topically 2 (two) times daily. What changed: when to take this   leptospermum manuka honey Pste paste Apply 1 Application topically daily. Start taking on: February 02, 2024   loperamide  2 MG tablet Commonly known as: IMODIUM  A-D Take 1 tablet (2 mg total) by mouth 4 (four) times daily as needed for diarrhea or loose stools.   ondansetron  4 MG  disintegrating tablet Commonly known as: ZOFRAN -ODT Take 1 tablet (4 mg total) by mouth every 8 (eight) hours as needed for nausea or vomiting.   pantoprazole  40 MG tablet Commonly known as: PROTONIX  Take 1 tablet (40 mg total) by mouth 2 (two) times daily.   POTASSIUM PO Take 1 tablet by mouth daily with breakfast.   PROBIOTIC PEARLS ADVANTAGE PO Take 1 capsule by mouth in the morning.   protein supplement shake Liqd Commonly known as: PREMIER PROTEIN Take 11 oz by mouth See admin instructions. Drink one to two cartons daily   silodosin  8 MG Caps capsule Commonly known as: RAPAFLO  Take 8 mg by mouth daily.   Vashe Cleansing Soln Apply 1 application  topically as needed. With dressing changes   Vitamin-B Complex Tabs Take 1 tablet by mouth daily with breakfast.  Discharge Care Instructions  (From admission, onward)           Start     Ordered   02/01/24 0000  Discharge wound care:       Comments: Cleanse with Vashe #[id number], pat dry the peri-wound skin, not rinse. Apply Santyl  daily on the wound bed. Cover with foam dressing, change every 3 days or PRN.   Utilize a chair pressure redistribution pad.   Noted patient will discharge to hospital at home program, as such would not be a candidate for a LALM. Continue frequent turning to off load pressure.   02/01/24 1232            Follow-up Information     [name], [name], MD Follow up.   Specialty: Internal Medicine Why: Hospital Follow-up; Dr Janey off will call you with appointment date and time Contact information: [address] [place] [address] [phone]         Care, [facility] Follow up.   Specialty: Home Health Services Why: Home Health Contact information: [address] [place] [address] [phone]                Discharge Exam: Filed Weights   01/30/24 1100 01/31/24 1200  Weight: 84.4 kg 84.4 kg     General exam: awake,  alert, no acute distress HEENT: moist mucus membranes, hearing grossly normal  Respiratory system: CTAB, no wheezes, rales or rhonchi, normal respiratory effort. Cardiovascular system: normal S1/S2, RRR, no pedal edema.   Gastrointestinal system: soft, NT, ND Central nervous system: A&O x3. no gross focal neurologic deficits, normal speech= Skin: dry, intact, normal temperature Psychiatry: normal mood, congruent affect, judgement and insight appear normal   Condition at discharge: stable  The results of significant diagnostics from this hospitalization (including imaging, microbiology, ancillary and laboratory) are listed below for reference.   Imaging Studies: IR Facet JT Inj C/T  Single Level Right W/FL/CT Addendum Date: 01/28/2024 ADDENDUM REPORT: 01/28/2024 10:39 ADDENDUM: The procedure was performed for P ilia to pain relief in a patient with significant paravertebral pain secondary to osseous metastases. Oral pain medication not significantly alleviating the patient's pain without significant disturbances to the patient's mentation and activity level. The patient has underlying diagnosis of metastatic non-small cell lung cancer. The right ninth thoracic rib and ninth right-sided transverse process are heavily sclerotic secondary to involvement with malignancy. The plan is for a 3 level paravertebral block in order to anesthetize the contributing nerve roots for the patient's pain. Electronically Signed   By: Cordella Banner   On: 01/28/2024 10:39   Result Date: 01/28/2024 CLINICAL DATA:  Patient with metastatic non small cell lung cancer with focal pain at the T9 costovertebral junction with metastatic disease involving the T9 vertebrae and associated transverse process. Planned perivertebral block on the right to involve the T8, T9, and T10 ANESTHESIA/SEDATION: Moderate (conscious) sedation was employed during this procedure. A total of Versed  2 mg and Fentanyl  150 mcg was administered  intravenously by the radiology nurse. Total intra-service moderate Sedation Time: 27 minutes. The patient's level of consciousness and vital signs were monitored continuously by radiology nursing throughout the procedure under my direct supervision. EXAM: Paravertebral block 3 levels right-sided With the patient in a prone position a a posterior approach was taken to the paravertebral space at 3 separate levels on the patient. Initially fluoroscopy was used to identify the T9 sclerotic rib and marked the ribs and transverse processes associated with T8, T9, and  T10 on the right side. After the patient's skin was marked for the appropriate levels, ultrasound was used to evaluate the paravertebral space. The ultrasound probe was then oriented to place the transverse process and the associated rib in in orientation to highlight the superior costo transverse ligament and internal intercostal membrane. Beginning at the T8 level, all 3 levels were anesthetized with 1% lidocaine  by advancing the needle in the expected trajectory under ultrasound guidance. After infiltrating the regions with 1% lidocaine  a mixture of bupivacaine  50% and triamcinolone  1 milligram/milliliter (total 40 mg) was mixed at the table and separated into 3 syringes. Each syringe was then used to infiltrate the paravertebral space by advancing the needle from the skin through the superior costo transverse ligament to penetrate the region of interest. Once the needle identified in the correct space, each space was aspirated and when no blood was returned, the mixture was then infiltrated into the paravertebral space and watching as the parietal pleura was deflected down words demonstrating satisfactory position. After all 3 levels (T8-T9 and T10 were infiltrated) were satisfactory filled with bupivacaine  and triamcinolone  all needles were removed and sterile dressing was applied. FLUOROSCOPY: Radiation Exposure Index (as provided by the fluoroscopic  device): 0.3 mGy Kerma IMPRESSION: Successful right-sided paravertebral block for T8, T9, and T10. The patient will be followed clinically. Electronically Signed: By: Cordella Banner On: 01/21/2024 08:46   IR Facet JT Inj C/T  2nd Level Right W/FL/CT Addendum Date: 01/28/2024 ADDENDUM REPORT: 01/28/2024 10:39 ADDENDUM: The procedure was performed for P ilia to pain relief in a patient with significant paravertebral pain secondary to osseous metastases. Oral pain medication not significantly alleviating the patient's pain without significant disturbances to the patient's mentation and activity level. The patient has underlying diagnosis of metastatic non-small cell lung cancer. The right ninth thoracic rib and ninth right-sided transverse process are heavily sclerotic secondary to involvement with malignancy. The plan is for a 3 level paravertebral block in order to anesthetize the contributing nerve roots for the patient's pain. Electronically Signed   By: Cordella Banner   On: 01/28/2024 10:39   Result Date: 01/28/2024 CLINICAL DATA:  Patient with metastatic non small cell lung cancer with focal pain at the T9 costovertebral junction with metastatic disease involving the T9 vertebrae and associated transverse process. Planned perivertebral block on the right to involve the T8, T9, and T10 ANESTHESIA/SEDATION: Moderate (conscious) sedation was employed during this procedure. A total of Versed  2 mg and Fentanyl  150 mcg was administered intravenously by the radiology nurse. Total intra-service moderate Sedation Time: 27 minutes. The patient's level of consciousness and vital signs were monitored continuously by radiology nursing throughout the procedure under my direct supervision. EXAM: Paravertebral block 3 levels right-sided With the patient in a prone position a a posterior approach was taken to the paravertebral space at 3 separate levels on the patient. Initially fluoroscopy was used to identify the T9  sclerotic rib and marked the ribs and transverse processes associated with T8, T9, and T10 on the right side. After the patient's skin was marked for the appropriate levels, ultrasound was used to evaluate the paravertebral space. The ultrasound probe was then oriented to place the transverse process and the associated rib in in orientation to highlight the superior costo transverse ligament and internal intercostal membrane. Beginning at the T8 level, all 3 levels were anesthetized with 1% lidocaine  by advancing the needle in the expected trajectory under ultrasound guidance. After infiltrating the regions with 1% lidocaine  a  mixture of bupivacaine  50% and triamcinolone  1 milligram/milliliter (total 40 mg) was mixed at the table and separated into 3 syringes. Each syringe was then used to infiltrate the paravertebral space by advancing the needle from the skin through the superior costo transverse ligament to penetrate the region of interest. Once the needle identified in the correct space, each space was aspirated and when no blood was returned, the mixture was then infiltrated into the paravertebral space and watching as the parietal pleura was deflected down words demonstrating satisfactory position. After all 3 levels (T8-T9 and T10 were infiltrated) were satisfactory filled with bupivacaine  and triamcinolone  all needles were removed and sterile dressing was applied. FLUOROSCOPY: Radiation Exposure Index (as provided by the fluoroscopic device): 0.3 mGy Kerma IMPRESSION: Successful right-sided paravertebral block for T8, T9, and T10. The patient will be followed clinically. Electronically Signed: By: Cordella Banner On: 01/21/2024 08:46   IR Facet JT Inj C/T 3rd Plus Level Right W/Fl/CT Addendum Date: 01/28/2024 ADDENDUM REPORT: 01/28/2024 10:39 ADDENDUM: The procedure was performed for P ilia to pain relief in a patient with significant paravertebral pain secondary to osseous metastases. Oral pain  medication not significantly alleviating the patient's pain without significant disturbances to the patient's mentation and activity level. The patient has underlying diagnosis of metastatic non-small cell lung cancer. The right ninth thoracic rib and ninth right-sided transverse process are heavily sclerotic secondary to involvement with malignancy. The plan is for a 3 level paravertebral block in order to anesthetize the contributing nerve roots for the patient's pain. Electronically Signed   By: Cordella Banner   On: 01/28/2024 10:39   Result Date: 01/28/2024 CLINICAL DATA:  Patient with metastatic non small cell lung cancer with focal pain at the T9 costovertebral junction with metastatic disease involving the T9 vertebrae and associated transverse process. Planned perivertebral block on the right to involve the T8, T9, and T10 ANESTHESIA/SEDATION: Moderate (conscious) sedation was employed during this procedure. A total of Versed  2 mg and Fentanyl  150 mcg was administered intravenously by the radiology nurse. Total intra-service moderate Sedation Time: 27 minutes. The patient's level of consciousness and vital signs were monitored continuously by radiology nursing throughout the procedure under my direct supervision. EXAM: Paravertebral block 3 levels right-sided With the patient in a prone position a a posterior approach was taken to the paravertebral space at 3 separate levels on the patient. Initially fluoroscopy was used to identify the T9 sclerotic rib and marked the ribs and transverse processes associated with T8, T9, and T10 on the right side. After the patient's skin was marked for the appropriate levels, ultrasound was used to evaluate the paravertebral space. The ultrasound probe was then oriented to place the transverse process and the associated rib in in orientation to highlight the superior costo transverse ligament and internal intercostal membrane. Beginning at the T8 level, all 3 levels were  anesthetized with 1% lidocaine  by advancing the needle in the expected trajectory under ultrasound guidance. After infiltrating the regions with 1% lidocaine  a mixture of bupivacaine  50% and triamcinolone  1 milligram/milliliter (total 40 mg) was mixed at the table and separated into 3 syringes. Each syringe was then used to infiltrate the paravertebral space by advancing the needle from the skin through the superior costo transverse ligament to penetrate the region of interest. Once the needle identified in the correct space, each space was aspirated and when no blood was returned, the mixture was then infiltrated into the paravertebral space and watching as the parietal pleura was  deflected down words demonstrating satisfactory position. After all 3 levels (T8-T9 and T10 were infiltrated) were satisfactory filled with bupivacaine  and triamcinolone  all needles were removed and sterile dressing was applied. FLUOROSCOPY: Radiation Exposure Index (as provided by the fluoroscopic device): 0.3 mGy Kerma IMPRESSION: Successful right-sided paravertebral block for T8, T9, and T10. The patient will be followed clinically. Electronically Signed: By: Cordella Banner On: 01/21/2024 08:46   US  EKG SITE RITE Result Date: 01/24/2024 If Site Rite image not attached, placement could not be confirmed due to current cardiac rhythm.  ECHOCARDIOGRAM COMPLETE Result Date: 01/24/2024    ECHOCARDIOGRAM REPORT   Patient Name:   HANSON MEDEIROS Date of Exam: 01/24/2024 Medical Rec #:  [id number]       Height:       73.0 in Accession #:    [id number]      Weight:       192.7 lb Date of Birth:  03/09/1941       BSA:          2.118 m Patient Age:    82 years        BP:           131/78 mmHg Patient Gender: M               HR:           74 bpm. Exam Location:  Inpatient Procedure: 2D Echo, Cardiac Doppler, Color Doppler and Intracardiac            Opacification Agent (Both Spectral and Color Flow Doppler were            utilized during  procedure). Indications:    CHF  History:        Patient has prior history of Echocardiogram examinations, most                 recent 07/19/2023. Risk Factors:Hypertension.  Sonographer:    Philomena Daring Referring Phys: [id number] VASUNDHRA RATHORE IMPRESSIONS  1. Left ventricular ejection fraction, by estimation, is 30 to 35%. The left ventricle has moderately decreased function. The left ventricle demonstrates regional wall motion abnormalities (see scoring diagram/findings for description). There is mild left ventricular hypertrophy. Left ventricular diastolic parameters are indeterminate.  2. Right ventricular systolic function is mildly reduced. The right ventricular size is normal. Normal RV basal function with apical hypokinesis  3. Moderate pleural effusion in the left lateral region.  4. The mitral valve was not well visualized. Trivial mitral valve regurgitation.  5. The aortic valve was not well visualized. Aortic valve regurgitation is not visualized. No aortic stenosis is present.  6. The inferior vena cava is normal in size with greater than 50% respiratory variability, suggesting right atrial pressure of 3 mmHg. Conclusion(s)/Recommendation(s): Normal LV basal function with apical akinesis. Could represent Takotsubo cardiomyopathy vs LAD territory infarct. FINDINGS  Left Ventricle: Left ventricular ejection fraction, by estimation, is 30 to 35%. The left ventricle has moderately decreased function. The left ventricle demonstrates regional wall motion abnormalities. Definity  contrast agent was given IV to delineate the left ventricular endocardial borders. The left ventricular internal cavity size was normal in size. There is mild left ventricular hypertrophy. Left ventricular diastolic parameters are indeterminate.  LV Wall Scoring: The entire apex is akinetic. The mid anteroseptal segment, mid inferolateral segment, mid anterolateral segment, mid inferoseptal segment, mid anterior segment, and mid  inferior segment are hypokinetic. The basal anteroseptal segment, basal inferolateral segment, basal anterolateral segment, basal anterior segment, basal inferior  segment, and basal inferoseptal segment are normal. Right Ventricle: The right ventricular size is normal. No increase in right ventricular wall thickness. Right ventricular systolic function is mildly reduced. Left Atrium: Left atrial size was normal in size. Right Atrium: Right atrial size was normal in size. Pericardium: There is no evidence of pericardial effusion. Mitral Valve: The mitral valve was not well visualized. Trivial mitral valve regurgitation. Tricuspid Valve: The tricuspid valve is normal in structure. Tricuspid valve regurgitation is trivial. Aortic Valve: The aortic valve was not well visualized. Aortic valve regurgitation is not visualized. No aortic stenosis is present. Pulmonic Valve: The pulmonic valve was not well visualized. Pulmonic valve regurgitation is not visualized. Aorta: The aortic root is normal in size and structure. Venous: The inferior vena cava is normal in size with greater than 50% respiratory variability, suggesting right atrial pressure of 3 mmHg. IAS/Shunts: The interatrial septum was not well visualized. Additional Comments: There is a moderate pleural effusion in the left lateral region.  LEFT VENTRICLE PLAX 2D LVIDd:         4.90 cm LVIDs:         3.90 cm LV PW:         1.00 cm LV IVS:        1.10 cm LVOT diam:     2.00 cm LV SV:         45 LV SV Index:   21 LVOT Area:     3.14 cm  LV Volumes (MOD) LV vol d, MOD A2C: 84.1 ml LV vol d, MOD A4C: 147.0 ml LV vol s, MOD A2C: 43.5 ml LV vol s, MOD A4C: 73.9 ml LV SV MOD A2C:     40.6 ml LV SV MOD A4C:     147.0 ml LV SV MOD BP:      59.8 ml RIGHT VENTRICLE             IVC RV S prime:     10.90 cm/s  IVC diam: 1.30 cm TAPSE (M-mode): 2.1 cm LEFT ATRIUM           Index        RIGHT ATRIUM           Index LA diam:      3.20 cm 1.51 cm/m   RA Area:     13.50 cm LA  Vol (A4C): 35.5 ml 16.76 ml/m  RA Volume:   28.00 ml  13.22 ml/m  AORTIC VALVE LVOT Vmax:   85.40 cm/s LVOT Vmean:  54.000 cm/s LVOT VTI:    0.144 m  AORTA Ao Root diam: 3.10 cm  SHUNTS Systemic VTI:  0.14 m Systemic Diam: 2.00 cm Lonni Nanas MD Electronically signed by Lonni Nanas MD Signature Date/Time: 01/24/2024/12:28:37 PM    Final    DG Chest 2 View Result Date: 01/24/2024 CLINICAL DATA:  [id number] CAP (community acquired pneumonia) [id number] EXAM: CHEST - 2 VIEW COMPARISON:  January 22, 2024 FINDINGS: Similar appearance of the left mid lung nodular opacity and masslike consolidation of the right perihilar region with fiducial markers present. Significantly improved aeration of the lungs. Small layering bilateral pleural effusions with persistent left basilar consolidation. No pneumothorax. Mild cardiomegaly. Tortuous aorta with aortic atherosclerosis. No acute fracture or destructive lesions. Multilevel thoracic osteophytosis. IMPRESSION: Significantly improved aeration of the lungs with decreasing pleural effusions, small on the left and trace on the right. Persistent retrocardiac airspace consolidation again noted. Electronically Signed   By: Rogelia Myers M.D.   On:  01/24/2024 12:06   CT Angio Chest PE W and/or Wo Contrast Result Date: 01/22/2024 CLINICAL DATA:  High probability for PE. History of non-small cell lung cancer. EXAM: CT ANGIOGRAPHY CHEST WITH CONTRAST TECHNIQUE: Multidetector CT imaging of the chest was performed using the standard protocol during bolus administration of intravenous contrast. Multiplanar CT image reconstructions and MIPs were obtained to evaluate the vascular anatomy. RADIATION DOSE REDUCTION: This exam was performed according to the departmental dose-optimization program which includes automated exposure control, adjustment of the mA and/or kV according to patient size and/or use of iterative reconstruction technique. CONTRAST:  75mL OMNIPAQUE  IOHEXOL  350  MG/ML SOLN COMPARISON:  CT angiogram chest 12/30/2023. FINDINGS: Cardiovascular: Satisfactory opacification of the pulmonary arteries to the segmental level. No evidence of pulmonary embolism. Normal heart size. No pericardial effusion. There are atherosclerotic calcifications of the aorta. Mediastinum/Nodes: No enlarged mediastinal, hilar, or axillary lymph nodes. Thyroid  gland, trachea, and esophagus demonstrate no significant findings. Lungs/Pleura: Small bilateral pleural effusions, left greater than right, has increased from prior. There is atelectasis in the bilateral lower lobes. There is central peribronchial wall thickening diffusely. Additional patchy ground-glass and airspace opacities are seen centrally in the right upper lobe and left upper lobe, new from prior. Focal masslike densities in the left upper lobe and right lower lobe with fiduciary markers appear grossly unchanged. There is no evidence for pneumothorax. Trachea and central airways are patent. Upper Abdomen: No acute abnormality. Musculoskeletal: No chest wall abnormality. No acute or significant osseous findings. Review of the MIP images confirms the above findings. IMPRESSION: 1. No evidence for pulmonary embolism. 2. Small bilateral pleural effusions, left greater than right, have increased from prior. 3. Patchy ground-glass and airspace opacities centrally in the right upper lobe and left upper lobe, new from prior, worrisome for infection or inflammation. 4. Central peribronchial wall thickening diffusely. 5. Stable masslike densities in the left upper lobe and right lower lobe with fiduciary markers. 6. Aortic atherosclerosis. Aortic Atherosclerosis (ICD10-I70.0). Electronically Signed   By: Greig Pique M.D.   On: 01/22/2024 23:19   DG Chest Portable 1 View Result Date: 01/22/2024 CLINICAL DATA:  Dyspnea. EXAM: PORTABLE CHEST 1 VIEW COMPARISON:  01/17/2024 FINDINGS: Cardiac enlargement. Increasing coarse perihilar infiltrates in  the lungs since prior study. This may represent developing edema or pneumonia. The known left upper lobe mass is obscured by the parenchymal process. No pleural effusion. No pneumothorax. Surgical clips projected over the hila. Calcification of the aorta. Degenerative changes in the spine and shoulders. IMPRESSION: Developing coarse perihilar infiltrates may indicate pneumonia or edema. Electronically Signed   By: Elsie Gravely M.D.   On: 01/22/2024 21:33   CT ABDOMEN PELVIS W CONTRAST Result Date: 01/17/2024 CLINICAL DATA:  Diarrhea and weakness, history of lung cancer EXAM: CT ABDOMEN AND PELVIS WITH CONTRAST TECHNIQUE: Multidetector CT imaging of the abdomen and pelvis was performed using the standard protocol following bolus administration of intravenous contrast. RADIATION DOSE REDUCTION: This exam was performed according to the departmental dose-optimization program which includes automated exposure control, adjustment of the mA and/or kV according to patient size and/or use of iterative reconstruction technique. CONTRAST:  OMNIPAQUE  IOHEXOL  300 MG/ML  SOLN COMPARISON:  CT abdomen pelvis January 03, 2024 FINDINGS: Lower chest: Stable small right and moderate left pleural effusions. Subjacent atelectasis of bilateral lung base. The heart size is normal. Atherosclerotic calcifications of coronary arteries partially visualized. Trace pericardial fluid. Hepatobiliary: No focal hepatic lesions. No intra or extrahepatic biliary ductal dilation. Query gallbladder fundal  adenomyomatosis. interval resolution of layering hyperattenuation within the gallbladder. No focal lesions or main ductal dilation. Spleen: Normal in size without focal abnormality. Adrenals/Urinary Tract: Nodular thickening of the left adrenal gland similar to prior. No right adrenal nodule. No suspicious renal mass, calculi or hydronephrosis. Mild circumferential thickening of the urinary bladder. Right kidney cortical cysts which does not  require imaging follow-up. Left kidney lower pole punctate nonobstructive nephrolithiasis. Stomach/Bowel: Interval removal of the enteric tube. Normal appearance of the stomach. Small duodenal diverticulum D3. Colonic diverticulosis. Peritoneal fat stranding in midline pelvic and left lower quadrant, similar to prior. Trace pelvic fluid. Of normal appendix. No abnormal bowel wall thickening or obstruction. No pericolonic fluid collection along the ascending colon. Vascular/Lymphatic: Aortic atherosclerosis. No enlarged abdominal or pelvic lymph nodes. Reproductive: Prostatomegaly.  Fiducial markers in place. Other: Mild peritoneal fat stranding and trace ascites, similar to prior. Small fat-containing left inguinal hernia. Small fat containing umbilical hernia Musculoskeletal: Multilevel degenerative changes of the spine. Osseous sclerotic changes involving the left sacrum and adjacent iliac bone, T9 transverse process and right posterior ninth rib. IMPRESSION: 1. No suspicious bowel lesion or obstruction. Compared resolution of wall thickening and pericolonic fat stranding at the cecal region. Colonic diverticulosis. Persistent peritoneal fat stranding and trace free fluid predominantly in left pelvic cavity, similar to prior. 2.  Stable sclerotic osseous metastasis.  No new osseous lesion. 4.  Stable bilateral pleural effusion and subjacent atelectasis . 5.Nodular thickening of left adrenal gland . Viable or treated metastasis cannot be excluded in this patient with known malignancy. 5. Aortic Atherosclerosis (ICD10-I70.0). Electronically Signed   By: Megan  Zare M.D.   On: 01/17/2024 11:49   DG Chest 1 View Result Date: 01/17/2024 CLINICAL DATA:  Cough.  Known metastatic lung cancer EXAM: CHEST  1 VIEW COMPARISON:  01/04/2024 FINDINGS: Numerous leads and wires project over the chest. Midline trachea. Mild cardiomegaly. Atherosclerosis in the transverse aorta. No pleural effusion or pneumothorax. Mild right  hemidiaphragm elevation. Inferior left upper lobe lung mass is estimated at 3.4 cm. Left lower lobe airspace disease medially. IMPRESSION: Medial left lower lobe airspace disease which could represent atelectasis or infection. Left upper lobe lung mass, as before. Aortic Atherosclerosis (ICD10-I70.0). Electronically Signed   By: Rockey Kilts M.D.   On: 01/17/2024 10:47   DG CHEST PORT 1 VIEW Result Date: 01/04/2024 CLINICAL DATA:  [id number] Hypoxia [id number] EXAM: PORTABLE CHEST 1 VIEW COMPARISON:  December 30, 2023 FINDINGS: The cardiomediastinal silhouette is unchanged in contour. Similar irregular RIGHT perihilar contour compared to prior with associated biopsy clip. Similar biopsy clip in association with a LEFT superior lung mass. The enteric tube courses through the chest to the abdomen beyond the field-of-view. Atherosclerotic calcifications of the aorta. No pleural effusion. No pneumothorax. No acute pleuroparenchymal abnormality. Lytic lesion of the LEFT posterolateral third rib. IMPRESSION: 1. No acute cardiopulmonary abnormality. 2. Similar appearance of known pulmonary malignancy including a metastatic lesion of the LEFT posterior third rib. Electronically Signed   By: Corean Salter M.D.   On: 01/04/2024 17:53   CT ABDOMEN PELVIS W CONTRAST Result Date: 01/03/2024 CLINICAL DATA:  Follow-up colitis and bowel obstruction. Abdominal pain. * Tracking Code: BO * EXAM: CT ABDOMEN AND PELVIS WITH CONTRAST TECHNIQUE: Multidetector CT imaging of the abdomen and pelvis was performed using the standard protocol following bolus administration of intravenous contrast. RADIATION DOSE REDUCTION: This exam was performed according to the departmental dose-optimization program which includes automated exposure control, adjustment of the mA and/or kV according  to patient size and/or use of iterative reconstruction technique. CONTRAST:  OMNIPAQUE  IOHEXOL  300 MG/ML  SOLN COMPARISON:  CT abdomen and pelvis dated  12/30/2023 FINDINGS: Lower chest: Post treatment changes of the left upper and right lower lobe. Trace right and moderate left pleural effusions. Partially imaged heart size is normal. Coronary artery calcifications. Hepatobiliary: No focal hepatic lesions. No intra or extrahepatic biliary ductal dilation. Gallbladder fundal adenomyomatosis. Layering hyperattenuation within the gallbladder may represent vicariously excreted contrast. Pancreas: No focal lesions or main ductal dilation. Spleen: Normal in size without focal abnormality. Adrenals/Urinary Tract: Nodular thickening of the left adrenal gland without discrete nodule. No right adrenal nodule. No suspicious renal mass, calculi or hydronephrosis. Mild circumferential thickening of the urinary bladder. Stomach/Bowel: Enteric tube terminates in the gastric body. Normal appearance of the stomach. Small duodenal diverticulum arising from the third portion of the duodenum. Enteric contrast material reaches the rectum. Colonic diverticulosis without acute diverticulitis. Cecum is well distended without mural thickening. Normal appendix. Vascular/Lymphatic: Aortic atherosclerosis. No enlarged abdominal or pelvic lymph nodes. Reproductive: Mildly enlarged prostate gland with fiducials in-situ. Other: Small volume free fluid. No free air or fluid collection. Small fat-containing left inguinal hernia. Musculoskeletal: Unchanged sclerosis of the left sacroiliac joint, right T9 transverse process, and right posterior ninth rib. Multilevel degenerative changes of the partially imaged thoracic and lumbar spine. Mild body wall edema. IMPRESSION: 1. No evidence of bowel obstruction. Enteric contrast material reaches the rectum. 2. Mild circumferential thickening of the urinary bladder, which may be related to underdistention or cystitis. Recommend correlation with urinalysis. 3. Unchanged sclerosis of the left sacroiliac joint, right T9 transverse process, and right posterior  ninth rib, which may represent treated osseous metastases. 4. Trace right and moderate left pleural effusions. 5. Aortic Atherosclerosis (ICD10-I70.0). Electronically Signed   By: Limin  Xu M.D.   On: 01/03/2024 15:52   DG Abd Portable 1V Result Date: 01/03/2024 CLINICAL DATA:  Abdominal bloating cramping EXAM: PORTABLE ABDOMEN - 1 VIEW COMPARISON:  Abdominal radiograph dated 01/01/2024 FINDINGS: Gastric/enteric tube tip projects over the stomach. Bowel gas is seen to the level rectum. Diffuse gas-filled dilation of the colon. No free air or pneumatosis. No abnormal radio-opaque calculi or mass effect. No acute or substantial osseous abnormality. The sacrum and coccyx are partially obscured by overlying bowel contents. Prostate fiducials project over the midline lower pelvis. IMPRESSION: 1. Diffuse gas-filled dilation of the colon, which may represent ileus. 2. Gastric/enteric tube tip projects over the stomach. Electronically Signed   By: Limin  Xu M.D.   On: 01/03/2024 12:03    Microbiology: Results for orders placed or performed during the hospital encounter of 01/22/24  Resp panel by RT-PCR (RSV, Flu A&B, Covid) Anterior Nasal Swab     Status: None   Collection Time: 01/22/24  9:26 PM   Specimen: Anterior Nasal Swab  Result Value Ref Range Status   SARS Coronavirus 2 by RT PCR NEGATIVE NEGATIVE Final   Influenza A by PCR NEGATIVE NEGATIVE Final   Influenza B by PCR NEGATIVE NEGATIVE Final    Comment: (NOTE) The Xpert Xpress SARS-CoV-2/FLU/RSV plus assay is intended as an aid in the diagnosis of influenza from Nasopharyngeal swab specimens and should not be used as a sole basis for treatment. Nasal washings and aspirates are unacceptable for Xpert Xpress SARS-CoV-2/FLU/RSV testing.  Fact Sheet for Patients: BloggerCourse.com  Fact Sheet for Healthcare Providers: SeriousBroker.it  This test is not yet approved or cleared by the [place] FDA and has  been authorized for detection and/or diagnosis of SARS-CoV-2 by FDA under an Emergency Use Authorization (EUA). This EUA will remain in effect (meaning this test can be used) for the duration of the COVID-19 declaration under Section 564(b)(1) of the Act, 21 U.S.C. section 360bbb-3(b)(1), unless the authorization is terminated or revoked.     Resp Syncytial Virus by PCR NEGATIVE NEGATIVE Final    Comment: (NOTE) Fact Sheet for Patients: BloggerCourse.com  Fact Sheet for Healthcare Providers: SeriousBroker.it  This test is not yet approved or cleared by the [place]  FDA and has been authorized for detection and/or diagnosis of SARS-CoV-2 by FDA under an Emergency Use Authorization (EUA). This EUA will remain in effect (meaning this test can be used) for the duration of the COVID-19 declaration under Section 564(b)(1) of the Act, 21 U.S.C. section 360bbb-3(b)(1), unless the authorization is terminated or revoked.  Performed at [facility], [address] [place], [place] [address]   MRSA Next Gen by PCR, Nasal     Status: None   Collection Time: 01/23/24  3:00 AM   Specimen: Nasal Mucosa; Nasal Swab  Result Value Ref Range Status   MRSA by PCR Next Gen NOT DETECTED NOT DETECTED Final    Comment: (NOTE) The GeneXpert MRSA Assay (FDA approved for NASAL specimens only), is one component of a comprehensive MRSA colonization surveillance program. It is not intended to diagnose MRSA infection nor to guide or monitor treatment for MRSA infections. Test performance is not FDA approved in patients less than 65 years old. Performed at [facility], [address] [place], [place] [address]   Culture, blood (Routine X 2) w Reflex to ID Panel     Status: Abnormal   Collection Time: 01/23/24  3:46 AM   Specimen: BLOOD RIGHT FOREARM  Result Value Ref Range Status   Specimen Description BLOOD RIGHT  FOREARM  Final   Special Requests   Final    BOTTLES DRAWN AEROBIC AND ANAEROBIC Blood Culture adequate volume   Culture  Setup Time   Final    GRAM NEGATIVE RODS AEROBIC BOTTLE ONLY CRITICAL RESULT CALLED TO, READ BACK BY AND VERIFIED WITH: [facility] AMDREW GRETEL [id number] AT 1748 BY EC Performed at [facility], [address] [place], [place] [address]    Culture (A)  Final    ESCHERICHIA COLI Confirmed Extended Spectrum Beta-Lactamase Producer (ESBL).  In bloodstream infections from ESBL organisms, carbapenems are preferred over piperacillin/tazobactam. They are shown to have a lower risk of mortality.    Report Status 01/25/2024 FINAL  Final   Organism ID, Bacteria ESCHERICHIA COLI  Final      Susceptibility   Escherichia coli - MIC*    AMPICILLIN >=32 RESISTANT Resistant     CEFEPIME 16 RESISTANT Resistant     CEFTAZIDIME RESISTANT Resistant     CEFTRIAXONE  >=64 RESISTANT Resistant     CIPROFLOXACIN 1 RESISTANT Resistant     GENTAMICIN <=1 SENSITIVE Sensitive     IMIPENEM <=0.25 SENSITIVE Sensitive     TRIMETH/SULFA >=320 RESISTANT Resistant     AMPICILLIN/SULBACTAM 16 INTERMEDIATE Intermediate     PIP/TAZO <=4 SENSITIVE Sensitive ug/mL    * ESCHERICHIA COLI  Culture, blood (Routine X 2) w Reflex to ID Panel     Status: None   Collection Time: 01/23/24  3:46 AM   Specimen: BLOOD LEFT HAND  Result Value Ref Range Status   Specimen Description BLOOD LEFT HAND  Final   Special Requests  Final    BOTTLES DRAWN AEROBIC AND ANAEROBIC Blood Culture adequate volume   Culture   Final    NO GROWTH 5 DAYS Performed at [facility], [address], [place], [place] [address]    Report Status 01/28/2024 FINAL  Final  Blood Culture ID Panel (Reflexed)     Status: Abnormal   Collection Time: 01/23/24  3:46 AM  Result Value Ref Range Status   Enterococcus faecalis NOT DETECTED NOT DETECTED Final   Enterococcus Faecium NOT DETECTED NOT DETECTED Final   Listeria  monocytogenes NOT DETECTED NOT DETECTED Final   Staphylococcus species NOT DETECTED NOT DETECTED Final   Staphylococcus aureus (BCID) NOT DETECTED NOT DETECTED Final   Staphylococcus epidermidis NOT DETECTED NOT DETECTED Final   Staphylococcus lugdunensis NOT DETECTED NOT DETECTED Final   Streptococcus species NOT DETECTED NOT DETECTED Final   Streptococcus agalactiae NOT DETECTED NOT DETECTED Final   Streptococcus pneumoniae NOT DETECTED NOT DETECTED Final   Streptococcus pyogenes NOT DETECTED NOT DETECTED Final   A.calcoaceticus-baumannii NOT DETECTED NOT DETECTED Final   Bacteroides fragilis NOT DETECTED NOT DETECTED Final   Enterobacterales DETECTED (A) NOT DETECTED Final    Comment: Enterobacterales represent a large order of gram negative bacteria, not a single organism. CRITICAL RESULT CALLED TO, READ BACK BY AND VERIFIED WITH: PHARMD ANDREW MEYER [id number] AT 1748 BY EC    Enterobacter cloacae complex NOT DETECTED NOT DETECTED Final   Escherichia coli DETECTED (A) NOT DETECTED Final    Comment: CRITICAL RESULT CALLED TO, READ BACK BY AND VERIFIED WITH: PHARMD PRENTICE POISSON [id number] AT 1748 BY EC    Klebsiella aerogenes NOT DETECTED NOT DETECTED Final   Klebsiella oxytoca NOT DETECTED NOT DETECTED Final   Klebsiella pneumoniae NOT DETECTED NOT DETECTED Final   Proteus species NOT DETECTED NOT DETECTED Final   Salmonella species NOT DETECTED NOT DETECTED Final   Serratia marcescens NOT DETECTED NOT DETECTED Final   Haemophilus influenzae NOT DETECTED NOT DETECTED Final   Neisseria meningitidis NOT DETECTED NOT DETECTED Final   Pseudomonas aeruginosa NOT DETECTED NOT DETECTED Final   Stenotrophomonas maltophilia NOT DETECTED NOT DETECTED Final   Candida albicans NOT DETECTED NOT DETECTED Final   Candida auris NOT DETECTED NOT DETECTED Final   Candida glabrata NOT DETECTED NOT DETECTED Final   Candida krusei NOT DETECTED NOT DETECTED Final   Candida parapsilosis NOT DETECTED NOT  DETECTED Final   Candida tropicalis NOT DETECTED NOT DETECTED Final   Cryptococcus neoformans/gattii NOT DETECTED NOT DETECTED Final   CTX-M ESBL DETECTED (A) NOT DETECTED Final    Comment: CRITICAL RESULT CALLED TO, READ BACK BY AND VERIFIED WITH: PHARMD PRENTICE POISSON [id number] AT 1748 BY EC (NOTE) Extended spectrum beta-lactamase detected. Recommend a carbapenem as initial therapy.      Carbapenem resistance IMP NOT DETECTED NOT DETECTED Final   Carbapenem resistance KPC NOT DETECTED NOT DETECTED Final   Carbapenem resistance NDM NOT DETECTED NOT DETECTED Final   Carbapenem resist OXA 48 LIKE NOT DETECTED NOT DETECTED Final   Carbapenem resistance VIM NOT DETECTED NOT DETECTED Final    Comment: Performed at [facility], [address] [place], [place] [address]  Respiratory (~20 pathogens) panel by PCR     Status: None   Collection Time: 01/23/24  4:15 AM   Specimen: Nasopharyngeal Swab; Respiratory  Result Value Ref Range Status   Adenovirus NOT DETECTED NOT DETECTED Final   Coronavirus 229E NOT DETECTED NOT DETECTED Final    Comment: (  NOTE) The Coronavirus on the Respiratory Panel, DOES NOT test for the novel  Coronavirus (2019 nCoV)    Coronavirus HKU1 NOT DETECTED NOT DETECTED Final   Coronavirus NL63 NOT DETECTED NOT DETECTED Final   Coronavirus OC43 NOT DETECTED NOT DETECTED Final   Metapneumovirus NOT DETECTED NOT DETECTED Final   Rhinovirus / Enterovirus NOT DETECTED NOT DETECTED Final   Influenza A NOT DETECTED NOT DETECTED Final   Influenza B NOT DETECTED NOT DETECTED Final   Parainfluenza Virus 1 NOT DETECTED NOT DETECTED Final   Parainfluenza Virus 2 NOT DETECTED NOT DETECTED Final   Parainfluenza Virus 3 NOT DETECTED NOT DETECTED Final   Parainfluenza Virus 4 NOT DETECTED NOT DETECTED Final   Respiratory Syncytial Virus NOT DETECTED NOT DETECTED Final   Bordetella pertussis NOT DETECTED NOT DETECTED Final   Bordetella Parapertussis NOT DETECTED NOT  DETECTED Final   Chlamydophila pneumoniae NOT DETECTED NOT DETECTED Final   Mycoplasma pneumoniae NOT DETECTED NOT DETECTED Final    Comment: Performed at [facility], [address], [place], [place] [address]  Culture, blood (Routine X 2) w Reflex to ID Panel     Status: None   Collection Time: 01/23/24  6:50 PM   Specimen: BLOOD RIGHT HAND  Result Value Ref Range Status   Specimen Description BLOOD RIGHT HAND  Final   Special Requests   Final    BOTTLES DRAWN AEROBIC ONLY Blood Culture results may not be optimal due to an inadequate volume of blood received in culture bottles   Culture   Final    NO GROWTH 5 DAYS Performed at [facility], [address] [place], [place] [address]    Report Status 01/28/2024 FINAL  Final  Culture, blood (Routine X 2) w Reflex to ID Panel     Status: None   Collection Time: 01/23/24  6:55 PM   Specimen: BLOOD LEFT HAND  Result Value Ref Range Status   Specimen Description BLOOD LEFT HAND  Final   Special Requests   Final    BOTTLES DRAWN AEROBIC ONLY Blood Culture results may not be optimal due to an inadequate volume of blood received in culture bottles   Culture   Final    NO GROWTH 5 DAYS Performed at [facility], [address] [place], [place] [address]    Report Status 01/28/2024 FINAL  Final    Labs: CBC: Recent Labs  Lab 01/27/24 0745 01/29/24 1541 01/30/24 1241 01/31/24 0500  WBC 4.1 4.2 4.5 5.3  NEUTROABS 3.0  --   --   --   HGB 11.1* 11.6* 11.1* 10.6*  HCT 34.4* 36.3* 34.1* 32.5*  MCV 89.1 88.5 88.3 88.8  PLT 142* 113* 101* 85*   Basic Metabolic Panel: Recent Labs  Lab 01/27/24 1249 01/29/24 0500 01/29/24 1048 01/30/24 1241  NA 137  --  137 137  K 4.5  --  4.1 4.2  CL 106  --  104 105  CO2 22  --  23 21*  GLUCOSE 113*  --  101* 99  BUN 20  --  16 18  CREATININE 0.86  --  0.86 0.90  CALCIUM 8.3*  --  8.7* 8.4*  MG  --  2.2  --   --    Liver Function Tests: Recent Labs  Lab  01/27/24 1249 01/29/24 1048  AST 22 23  ALT 23 20  ALKPHOS 59 62  BILITOT 0.8 0.6  PROT 5.4* 6.3*  ALBUMIN 2.8*  3.2*   CBG: No results for input(s): GLUCAP in the last 168 hours.  Discharge time spent: greater than 30 minutes.  Signed: Burnard DELENA Cunning, DO [facility] 02/01/2024

## 2024-02-01 NOTE — Progress Notes (Signed)
 Communicated with patient via video tablet. Patient states he is feeling better today. AAOx4. No complaints of pain or discomfort. Denies dizziness and SOB. No loose stools overnight. Adequate PO intake this morning. Plan of care reviewed with patient and spouse. Possible discharge today.

## 2024-02-01 NOTE — Progress Notes (Signed)
 Pt seen for routine [facility] visit this morning. PT appears very alert and cheerful. Pt states he was able to walk upstairs last night and sleep in his bed and had no trouble, weakness, or dizziness this morning coming downstairs to the living room. Pt was able to eat cereal and a Glucerna shake this morning. Pt also states he had a good solid bm this morning as well. Pt states he is urinating better and has burning less often than not burning and his urine appears clear. Pt states he slept fairly well last night but was slightly restless due to the midline being in the arm on the side he usually sleeps on. Pt reports having a slightly better appetite and is looking forward to eating a baked potato and grilled hamburger later.   Pt denies any nausea, diarrhea, or dizziness.   Vitals and assessment obtained and charted as noted.   Pt had provider visit with Dr. Fausto and virtual RN. Pt and his wife would like referral to wound care for continued support as they integrate [facility] after discharge.   Pt took two of his cancer medications this morning per his Oncologist that were not sent by our pharmacy. I took photos of both medications and pills, and uploaded them to the media tab. I informed Tax inspector and MD.  Wound care performed and photos added to media tab. I showed wife as well so that she can assist in wound care in the future on days they do not have assistance. Pt was able to stand and hold onto mantle of fireplace in living room without any weakness, while I did wound care.   IMM letter was signed yesterday and copy left with Pt and other copy added to [name] shadow chart.

## 2024-02-01 NOTE — Plan of Care (Signed)
  Problem: Education: Goal: Knowledge of General Education information will improve Description: Including pain rating scale, medication(s)/side effects and non-pharmacologic comfort measures Outcome: Progressing   Problem: Health Behavior/Discharge Planning: Goal: Ability to manage health-related needs will improve Outcome: Progressing   Problem: Clinical Measurements: Goal: Ability to maintain clinical measurements within normal limits will improve Outcome: Progressing Goal: Will remain free from infection Description: IV Abx Outcome: Progressing Goal: Diagnostic test results will improve Outcome: Progressing Goal: Respiratory complications will improve Outcome: Progressing Goal: Cardiovascular complication will be avoided Outcome: Progressing   Problem: Activity: Goal: Risk for activity intolerance will decrease Outcome: Progressing   Problem: Nutrition: Goal: Adequate nutrition will be maintained Outcome: Progressing   Problem: Coping: Goal: Level of anxiety will decrease Outcome: Progressing   Problem: Elimination: Goal: Will not experience complications related to bowel motility Outcome: Progressing Goal: Will not experience complications related to urinary retention Outcome: Progressing   Problem: Pain Managment: Goal: General experience of comfort will improve and/or be controlled Outcome: Progressing   Problem: Safety: Goal: Ability to remain free from injury will improve Outcome: Progressing   Problem: Skin Integrity: Goal: Risk for impaired skin integrity will decrease Outcome: Progressing   Problem: Activity: Goal: Ability to tolerate increased activity will improve Outcome: Progressing   Problem: Clinical Measurements: Goal: Ability to maintain a body temperature in the normal range will improve Outcome: Progressing   Problem: Respiratory: Goal: Ability to maintain adequate ventilation will improve Outcome: Progressing Goal: Ability to  maintain a clear airway will improve Outcome: Progressing   Problem: Activity: Goal: Capacity to carry out activities will improve Outcome: Progressing   Problem: Cardiac: Goal: Ability to achieve and maintain adequate cardiopulmonary perfusion will improve Outcome: Progressing

## 2024-02-01 NOTE — Progress Notes (Signed)
 Communicated with patient via video tablet. Patient appears comfortable in the chair. Midline removed. AVS paperwork given to patient. Discharge instructions discussed with patient and spouse. Patient agreeable with discharge plan.

## 2024-02-02 DIAGNOSIS — C7951 Secondary malignant neoplasm of bone: Secondary | ICD-10-CM | POA: Diagnosis not present

## 2024-02-02 DIAGNOSIS — C3492 Malignant neoplasm of unspecified part of left bronchus or lung: Secondary | ICD-10-CM | POA: Diagnosis not present

## 2024-02-03 ENCOUNTER — Telehealth: Payer: Self-pay | Admitting: Medical Oncology

## 2024-02-03 NOTE — Telephone Encounter (Signed)
 I lvm that per Dr. Jeannett recommendation, he was instructed to apply hydrocortisone  cream to the affected areas and to monitor / call for progressive rash or worsening symptoms.

## 2024-02-03 NOTE — Telephone Encounter (Signed)
 Rash- Home Health nurse reported that the patient restarted Binimetinib  and Encorafenib  on Saturday.   On Sunday, the patient developed a patchy rash on the soles of both feet. He denied pain or itching.

## 2024-02-04 ENCOUNTER — Telehealth (HOSPITAL_COMMUNITY): Payer: Self-pay

## 2024-02-04 DIAGNOSIS — C3492 Malignant neoplasm of unspecified part of left bronchus or lung: Secondary | ICD-10-CM | POA: Diagnosis not present

## 2024-02-04 DIAGNOSIS — C7951 Secondary malignant neoplasm of bone: Secondary | ICD-10-CM | POA: Diagnosis not present

## 2024-02-04 NOTE — Telephone Encounter (Signed)
 Called and spoke to pt's wife Barnie to confirm/remind patient of their appointment [facility] on 02/05/24.   Appointment:   [x] Confirmed  [] Left mess   [] No answer/No voice mail  [] VM Full/unable to leave message  [] Phone not in service  Patient reminded to bring all medications and/or complete list.  Confirmed patient has transportation. Gave directions, instructed to utilize valet parking.

## 2024-02-05 ENCOUNTER — Ambulatory Visit (HOSPITAL_COMMUNITY): Payer: Self-pay | Admitting: Family Medicine

## 2024-02-05 ENCOUNTER — Telehealth: Payer: Self-pay | Admitting: Cardiology

## 2024-02-05 ENCOUNTER — Ambulatory Visit (HOSPITAL_COMMUNITY)
Admission: RE | Admit: 2024-02-05 | Discharge: 2024-02-05 | Disposition: A | Discharge status: Home / Self Care | Source: Ambulatory Visit | Attending: Family Medicine | Admitting: Family Medicine

## 2024-02-05 ENCOUNTER — Encounter (HOSPITAL_COMMUNITY): Payer: Self-pay

## 2024-02-05 VITALS — BP 154/86 | HR 73 | Ht 73.0 in | Wt 190.0 lb

## 2024-02-05 DIAGNOSIS — Z79620 Long term (current) use of immunosuppressive biologic: Secondary | ICD-10-CM | POA: Insufficient documentation

## 2024-02-05 DIAGNOSIS — R7881 Bacteremia: Secondary | ICD-10-CM

## 2024-02-05 DIAGNOSIS — I110 Hypertensive heart disease with heart failure: Secondary | ICD-10-CM | POA: Insufficient documentation

## 2024-02-05 DIAGNOSIS — B399 Histoplasmosis, unspecified: Secondary | ICD-10-CM | POA: Insufficient documentation

## 2024-02-05 DIAGNOSIS — C3412 Malignant neoplasm of upper lobe, left bronchus or lung: Secondary | ICD-10-CM | POA: Insufficient documentation

## 2024-02-05 DIAGNOSIS — D696 Thrombocytopenia, unspecified: Secondary | ICD-10-CM | POA: Diagnosis not present

## 2024-02-05 DIAGNOSIS — J910 Malignant pleural effusion: Secondary | ICD-10-CM | POA: Insufficient documentation

## 2024-02-05 DIAGNOSIS — I5022 Chronic systolic (congestive) heart failure: Secondary | ICD-10-CM | POA: Insufficient documentation

## 2024-02-05 DIAGNOSIS — C3492 Malignant neoplasm of unspecified part of left bronchus or lung: Secondary | ICD-10-CM | POA: Diagnosis not present

## 2024-02-05 DIAGNOSIS — Z79899 Other long term (current) drug therapy: Secondary | ICD-10-CM | POA: Diagnosis not present

## 2024-02-05 DIAGNOSIS — C778 Secondary and unspecified malignant neoplasm of lymph nodes of multiple regions: Secondary | ICD-10-CM | POA: Diagnosis not present

## 2024-02-05 DIAGNOSIS — C3431 Malignant neoplasm of lower lobe, right bronchus or lung: Secondary | ICD-10-CM | POA: Insufficient documentation

## 2024-02-05 DIAGNOSIS — E785 Hyperlipidemia, unspecified: Secondary | ICD-10-CM | POA: Diagnosis not present

## 2024-02-05 DIAGNOSIS — C3490 Malignant neoplasm of unspecified part of unspecified bronchus or lung: Secondary | ICD-10-CM | POA: Diagnosis present

## 2024-02-05 LAB — BASIC METABOLIC PANEL WITH GFR
Anion gap: 9 (ref 5–15)
BUN: 19 mg/dL (ref 8–23)
CO2: 22 mmol/L (ref 22–32)
Calcium: 9 mg/dL (ref 8.9–10.3)
Chloride: 109 mmol/L (ref 98–111)
Creatinine, Ser: 0.93 mg/dL (ref 0.61–1.24)
GFR, Estimated: >60 mL/min (ref >60)
Glucose, Bld: 93 mg/dL (ref 70–99)
Potassium: 4.5 mmol/L (ref 3.5–5.1)
Sodium: 140 mmol/L (ref 135–145)

## 2024-02-05 LAB — BRAIN NATRIURETIC PEPTIDE: B Natriuretic Peptide: 433 pg/mL — ABNORMAL HIGH (ref 0.0–100.0)

## 2024-02-05 MED ORDER — LOSARTAN POTASSIUM 25 MG PO TABS: 25.0000 mg | ORAL_TABLET | Freq: Every day | ORAL | 2 refills | Status: AC

## 2024-02-05 NOTE — Patient Instructions (Addendum)
 Good to see you today!  START Losartan  25 mg daily  Labs done today, your results will be available in MyChart, we will contact you for abnormal readings.  You have been referred to [facility]  [phone]) have reached out to them and they will call to schedule an appointment  We want you to be seen in 1-2 weeks

## 2024-02-05 NOTE — Progress Notes (Signed)
 ReDS Vest / Clip - 02/05/24 1416       ReDS Vest / Clip   Station Marker C    Ruler Value 28    ReDS Value Range Low volume    ReDS Actual Value 24

## 2024-02-05 NOTE — Addendum Note (Signed)
 Encounter addended by: Glena Harlene HERO, FNP on: 02/05/2024 4:55 PM  Actions taken: Clinical Note Signed

## 2024-02-05 NOTE — Telephone Encounter (Signed)
 [facility] called and are requesting pt be seen by Dr. [place] in the next 1-2 weeks. Please advise.

## 2024-02-06 DIAGNOSIS — C7951 Secondary malignant neoplasm of bone: Secondary | ICD-10-CM | POA: Diagnosis not present

## 2024-02-06 DIAGNOSIS — N390 Urinary tract infection, site not specified: Secondary | ICD-10-CM | POA: Diagnosis not present

## 2024-02-06 DIAGNOSIS — C3492 Malignant neoplasm of unspecified part of left bronchus or lung: Secondary | ICD-10-CM | POA: Diagnosis not present

## 2024-02-07 ENCOUNTER — Other Ambulatory Visit: Payer: Self-pay

## 2024-02-07 ENCOUNTER — Telehealth: Payer: Self-pay | Admitting: Internal Medicine

## 2024-02-07 NOTE — Progress Notes (Signed)
 Specialty Pharmacy Ongoing Clinical Assessment Note  Chad Moreno is a 83 y.o. male who is being followed by the specialty pharmacy service for RxSp Oncology   Patient's specialty medication(s) reviewed today: Binimetinib  (MEKTOVI ); Encorafenib  (BRAFTOVI )   Missed doses in the last 4 weeks: 0 (medication on hold for 4 weeks, then restarted at lower dose.)   Patient/Caregiver did not have any additional questions or concerns.   Therapeutic benefit summary: Patient is achieving benefit (Medication was on hold for 4 weeks, restarted on 8/8.)   Adverse events/side effects summary: No adverse events/side effects   Patient's therapy is appropriate to: Continue    Goals Addressed             This Visit's Progress    Slow Disease Progression   On track    Patient is on track. Patient will maintain adherence. Per OV on 6/25, CT showed slight decrease in the treated mass on right lower lob, unchanged mass on left upper lobe, pleural fluid decreased, and no new spots or spread of disease were noted.          Follow up: 3 months  [facility]

## 2024-02-07 NOTE — Telephone Encounter (Signed)
 Scheduled appointments with the patients wife.

## 2024-02-07 NOTE — Addendum Note (Signed)
 Encounter addended by: Glena Harlene HERO, FNP on: 02/07/2024 12:23 PM  Actions taken: Clinical Note Signed

## 2024-02-07 NOTE — Addendum Note (Signed)
 Encounter addended by: Glena Harlene HERO, FNP on: 02/07/2024 12:26 PM  Actions taken: Clinical Note Signed

## 2024-02-10 ENCOUNTER — Telehealth: Payer: Self-pay | Admitting: Nurse Practitioner

## 2024-02-10 NOTE — Telephone Encounter (Signed)
 Scheduled appointment per staff message. Talked with the patient and he is aware of the made appointment.

## 2024-02-11 ENCOUNTER — Encounter: Payer: Self-pay | Admitting: Nurse Practitioner

## 2024-02-11 ENCOUNTER — Inpatient Hospital Stay (HOSPITAL_BASED_OUTPATIENT_CLINIC_OR_DEPARTMENT_OTHER): Admitting: Nurse Practitioner

## 2024-02-11 DIAGNOSIS — R530 Neoplastic (malignant) related fatigue: Secondary | ICD-10-CM | POA: Diagnosis not present

## 2024-02-11 DIAGNOSIS — C7951 Secondary malignant neoplasm of bone: Secondary | ICD-10-CM

## 2024-02-11 DIAGNOSIS — Z515 Encounter for palliative care: Secondary | ICD-10-CM

## 2024-02-11 DIAGNOSIS — C3492 Malignant neoplasm of unspecified part of left bronchus or lung: Secondary | ICD-10-CM

## 2024-02-11 NOTE — Progress Notes (Signed)
 Palliative Medicine [facility]  Telephone:([phone]) [phone] Fax:(336) [phone]   Name: Chad Moreno Date: 02/11/2024 MRN: [id number]  DOB: 24-Mar-1941  Patient Care Team: [name], MD as PCP - General (Internal Medicine) Livingston Rigg, MD as Consulting Physician (Dermatology) Vertell Pont, RN as Oncology Nurse Navigator Bouchard, Duwaine BROCKS, RN as Oncology Nurse Navigator Pickenpack-Cousar, Fannie SAILOR, NP as Nurse Practitioner [facility] and Palliative Medicine)   I connected with [name] on 02/11/24 at 11:00 AM EDT by telephone and verified that I am speaking with the correct person using two identifiers.   I discussed the limitations, risks, security and privacy concerns of performing an evaluation and management service by telemedicine and the availability of in-person appointments. I also discussed with the patient that there may be a patient responsible charge related to this service. The patient expressed understanding and agreed to proceed.   Other persons participating in the visit and their role in the encounter: Wife   Patient's location: Home  Provider's location: [facility]   INTERVAL HISTORY: [name] is a 83 y.o. male with  oncologic medical history including recurrent metastatic non-small cell lung cancer initially diagnosed in March 2024 s/p SBRT.  Now with metastatic disease including left axillary, bilateral supraclavicular, abdominal lymphadenopathy, and malignant pleural effusion (05/2023) s/p palliative radiation to right chest completed April 2025.  Patient currently on Encorafenib  and binimetinib .  Palliative is seeing patient for symptom management and goals of care.   SOCIAL HISTORY:     reports that he quit smoking about 44 years ago. His smoking use included cigarettes. He started smoking about 54 years ago. He has a 5 pack-year smoking history. He has never used smokeless tobacco. He reports current alcohol  use. He reports that he  does not use drugs.  ADVANCE DIRECTIVES:  Patient to bring in documents to be scanned on file. He reports his wife Treveon Bourcier is his Museum/gallery exhibitions officer.   CODE STATUS: DNR  PAST MEDICAL HISTORY: Past Medical History:  Diagnosis Date   Acute hypoxemic respiratory failure (HCC) 01/23/2024   Acute systolic CHF (congestive heart failure) (HCC) 01/24/2024   Age-related cataract of left eye    Amblyopia, right eye    Arthritis    Bacteremia due to Escherichia coli 02/01/2024   Cancer (HCC)    CAP (community acquired pneumonia) 01/23/2024   Dysuria    ED (erectile dysfunction)    Elevated PSA    Epidermal cyst    Family history of prostate cancer    Gout    Hip pain    Histoplasmosis    Hyperlipidemia    Hypermetropia, left eye    Hypertension    Hypokalemia 01/25/2024   Impaired glucose tolerance    Joint pain    Left shoulder pain    Lower back pain    Melanocytic nevi, unspecified    Nummular dermatitis    Ocular hypertension    Palmar fascial fibromatosis    Prostate cancer screening    Regular astigmatism, right eye    Senile nuclear sclerosis    SIRS (systemic inflammatory response syndrome) (HCC) 01/23/2024    ALLERGIES:  is allergic to sulfa antibiotics.  MEDICATIONS:  Current Outpatient Medications  Medication Sig Dispense Refill   ciprofloxacin (CIPRO) 500 MG tablet Take 500 mg by mouth 2 (two) times daily.     ascorbic acid  (VITAMIN C ) 500 MG tablet Take 1 tablet (500 mg total) by mouth daily.     B Complex  Vitamins (VITAMIN-B COMPLEX) TABS Take 1 tablet by mouth daily with breakfast.     [Paused] binimetinib  (MEKTOVI ) 15 MG tablet Take 3 tablets (45 mg total) by mouth 2 (two) times daily. 180 tablet 3   calcium carbonate (OSCAL) 1500 (600 Ca) MG TABS tablet Take 1,200 mg by mouth 2 (two) times daily with a meal.     celecoxib  (CELEBREX ) 200 MG capsule TAKE 1 CAPSULE BY MOUTH 2 TIMES A DAY 60 capsule 3   cyanocobalamin (VITAMIN B12) 1000 MCG tablet  Take 1,000 mcg by mouth daily.     [Paused] encorafenib  (BRAFTOVI ) 75 MG capsule Take 6 capsules (450 mg total) by mouth daily. 180 capsule 3   feeding supplement, GLUCERNA SHAKE, (GLUCERNA SHAKE) LIQD Take 237 mLs by mouth 3 (three) times daily between meals.     ferrous sulfate  325 (65 FE) MG EC tablet Take 1 tablet (325 mg total) by mouth daily as needed. (Patient taking differently: Take 325 mg by mouth every other day.)     furosemide  (LASIX ) 20 MG tablet Take 1 tablet (20 mg total) by mouth daily as needed. For weight gain or swelling 30 tablet 11   leptospermum manuka honey (MEDIHONEY) PSTE paste Apply 1 Application topically daily. 44 mL 1   loperamide  (IMODIUM  A-D) 2 MG tablet Take 1 tablet (2 mg total) by mouth 4 (four) times daily as needed for diarrhea or loose stools. 30 tablet 0   losartan  (COZAAR ) 25 MG tablet Take 1 tablet (25 mg total) by mouth daily. 90 tablet 2   Nystatin (GERHARDT'S BUTT CREAM) CREA Apply 1 Application topically 2 (two) times daily. 1 each 1   ondansetron  (ZOFRAN -ODT) 4 MG disintegrating tablet Take 1 tablet (4 mg total) by mouth every 8 (eight) hours as needed for nausea or vomiting. 20 tablet 0   pantoprazole  (PROTONIX ) 40 MG tablet Take 1 tablet (40 mg total) by mouth 2 (two) times daily. 60 tablet 0   POTASSIUM PO Take 1 tablet by mouth daily with breakfast.     Probiotic Product (PROBIOTIC PEARLS ADVANTAGE PO) Take 1 capsule by mouth in the morning.     protein supplement shake (PREMIER PROTEIN) LIQD Take 11 oz by mouth See admin instructions. Drink one to two cartons daily     silodosin  (RAPAFLO ) 8 MG CAPS capsule Take 8 mg by mouth daily.     Wound Cleansers (VASHE CLEANSING) SOLN Apply 1 application  topically as needed. With dressing changes 250 mL 1   No current facility-administered medications for this visit.    VITAL SIGNS: There were no vitals taken for this visit. There were no vitals filed for this visit.  Estimated body mass index is 25.07  kg/m as calculated from the following:   Height as of 02/05/24: 6' 1 (1.854 m).   Weight as of 02/05/24: 190 lb (86.2 kg).   PERFORMANCE STATUS (ECOG) : 1 - Symptomatic but completely ambulatory   IMPRESSION: Discussed the use of AI scribe software for clinical note transcription with the patient, who gave verbal consent to proceed.  History of Present Illness Talmage Teaster is an 83 year old male with metastatic cancer who I connected by phone for follow-up. His wife is engaged in discussions. Patient recently hospitalized at home due to fluid overload, CHF, and blood cultures positive for ESBL E. Coli requiring 7 days of antibiotics.   Denies concerns for nausea, vomiting, constipation, or diarrhea.   He has been experiencing burning during urination, and a  urine sample tested positive for bacteria, confirming a urinary tract infection that was collected by his [facility] nurse. His PCP has started him on Cipro 500mg  twice daily as of today.   This morning, he experienced significant dizziness and was unable to stand for any length of time. His blood pressure was recorded at 90/60 mmHg while sitting and dropped to 72/47 mmHg upon standing. This episode of dizziness and hypotension is the first since his recent hospital discharge. Subsequent blood pressure readings have returned to normal ranges, such as 120/60 mmHg and 132/76 mmHg.  He was recently started on losartan  25 mg for hypertension, which he began taking last Wednesday with no concerns until today. Encouraged wife to continue close monitoring of his blood pressure. If he continues to exhibit hypotension and orthostasis he will need to contact Cardiology and PCP for further guidance.   Charlena is doing amazing s/p nerve block for rib pain, which has been effective in managing his pain. He has not required opioids since January 08, 2024. He and wife are much appreciative of this.   We will continue to closely monitor and support.    All questions answered and support provided.   Goals of Care 12/03/23: We discussed his current illness and what it means in the larger context of his on-going co-morbidities. Natural disease trajectory and expectations were discussed. Mr. Delker and his wife are realistic in their understanding of his metastatic cancer and plan of care.    I empathetically approach discussions regarding advanced directives, CODE STATUS, and healthcare limitations.  Although patient does not have an advanced directive on file he states he does have a completed document.  Encouraged patient to bring in to allow for filing.  Patient states his wife Barnie is his medical decision-maker in the event he is unable to speak for himself.  His desires for natural death with no life-sustaining measures.  DNR/DNI.  No artificial feedings long-term.   Patient and wife are clear in her expressed wishes to continue to treat the treatable allow him every opportunity to continue to thrive.  His quality of life is most important to him.  Will like his symptoms to be managed to minimize discomfort.    I discussed the importance of continued conversation with family and their medical providers regarding overall plan of care and treatment options, ensuring decisions are within the context of the patients values and GOCs. Assessment & Plan  Urinary tract infection Positive bacterial culture with symptoms of dysuria. Dizziness and orthostatic hypotension possibly related to the infection. Blood pressure fluctuations potentially due to the infection's impact. - Monitor blood pressure closely over the next few days. - If dizziness and hypotension persist, contact cardiologist to discuss holding losartan  while on antibiotic treatment. - Administer new antibiotic as prescribed by primary care physician.  Orthostatic hypotension Dizziness and significant drop in blood pressure upon standing, possibly related to urinary tract infection  or medication. Blood pressure improved later in the day. - Advise sitting in place with no sudden movements if dizziness occurs. - Monitor blood pressure closely. - Contact cardiologist if symptoms persist.  Rib pain, status post nerve block No rib pain reported since nerve block procedure. No opioid use since July 16.  I will plan to see patient back in 6 weeks. Sooner if needed.   Patient expressed understanding and was in agreement with this plan. He also understands that He can call the clinic at any time with any questions, concerns, or complaints.  Any controlled substances utilized were prescribed in the context of palliative care. PDMP has been reviewed.   I provided 45 minutes of non face-to-face telephone visit time during this encounter, and > 50% was spent counseling as documented under my assessment & plan. 4Visit consisted of counseling and education dealing with the complex and emotionally intense issues of symptom management and palliative care in the setting of serious and potentially life-threatening illness.  Levon Borer, AGPCNP-BC  Palliative Medicine Team/[facility]

## 2024-02-12 DIAGNOSIS — C3492 Malignant neoplasm of unspecified part of left bronchus or lung: Secondary | ICD-10-CM | POA: Diagnosis not present

## 2024-02-12 DIAGNOSIS — C7951 Secondary malignant neoplasm of bone: Secondary | ICD-10-CM | POA: Diagnosis not present

## 2024-02-13 DIAGNOSIS — C7951 Secondary malignant neoplasm of bone: Secondary | ICD-10-CM | POA: Diagnosis not present

## 2024-02-13 DIAGNOSIS — C3492 Malignant neoplasm of unspecified part of left bronchus or lung: Secondary | ICD-10-CM | POA: Diagnosis not present

## 2024-02-14 DIAGNOSIS — C7951 Secondary malignant neoplasm of bone: Secondary | ICD-10-CM | POA: Diagnosis not present

## 2024-02-14 DIAGNOSIS — C3492 Malignant neoplasm of unspecified part of left bronchus or lung: Secondary | ICD-10-CM | POA: Diagnosis not present

## 2024-02-17 DIAGNOSIS — C3492 Malignant neoplasm of unspecified part of left bronchus or lung: Secondary | ICD-10-CM | POA: Diagnosis not present

## 2024-02-17 DIAGNOSIS — C7951 Secondary malignant neoplasm of bone: Secondary | ICD-10-CM | POA: Diagnosis not present

## 2024-02-18 DIAGNOSIS — C3492 Malignant neoplasm of unspecified part of left bronchus or lung: Secondary | ICD-10-CM | POA: Diagnosis not present

## 2024-02-18 DIAGNOSIS — C7951 Secondary malignant neoplasm of bone: Secondary | ICD-10-CM | POA: Diagnosis not present

## 2024-02-19 ENCOUNTER — Ambulatory Visit
Admission: RE | Admit: 2024-02-19 | Discharge: 2024-02-19 | Disposition: A | Discharge status: Home / Self Care | Source: Ambulatory Visit | Attending: Radiology | Admitting: Radiology

## 2024-02-19 DIAGNOSIS — C61 Malignant neoplasm of prostate: Secondary | ICD-10-CM | POA: Diagnosis not present

## 2024-02-19 DIAGNOSIS — I951 Orthostatic hypotension: Secondary | ICD-10-CM | POA: Diagnosis not present

## 2024-02-19 DIAGNOSIS — R42 Dizziness and giddiness: Secondary | ICD-10-CM | POA: Diagnosis not present

## 2024-02-19 DIAGNOSIS — C7951 Secondary malignant neoplasm of bone: Secondary | ICD-10-CM | POA: Diagnosis not present

## 2024-02-19 DIAGNOSIS — C3490 Malignant neoplasm of unspecified part of unspecified bronchus or lung: Secondary | ICD-10-CM | POA: Diagnosis not present

## 2024-02-19 DIAGNOSIS — C3492 Malignant neoplasm of unspecified part of left bronchus or lung: Secondary | ICD-10-CM | POA: Diagnosis not present

## 2024-02-19 DIAGNOSIS — G893 Neoplasm related pain (acute) (chronic): Secondary | ICD-10-CM

## 2024-02-19 DIAGNOSIS — I10 Essential (primary) hypertension: Secondary | ICD-10-CM | POA: Diagnosis not present

## 2024-02-19 DIAGNOSIS — S22070A Wedge compression fracture of T9-T10 vertebra, initial encounter for closed fracture: Secondary | ICD-10-CM | POA: Diagnosis not present

## 2024-02-19 DIAGNOSIS — D649 Anemia, unspecified: Secondary | ICD-10-CM | POA: Diagnosis not present

## 2024-02-19 DIAGNOSIS — S22060A Wedge compression fracture of T7-T8 vertebra, initial encounter for closed fracture: Secondary | ICD-10-CM | POA: Diagnosis not present

## 2024-02-19 HISTORY — PX: IR RADIOLOGIST EVAL & MGMT: IMG5224

## 2024-02-19 NOTE — Progress Notes (Signed)
 Referring Physician(s): Pommier,Wyatt H  Patient Status:  DRI ouptatient  Chief Complaint:  Chad Moreno is now approximately 1 month post paravertebral nerve block for T8, T9, and T10. Patient has underlying bone metastases involving the costovertebral region of T9 including the spinous process of T9. Patient underwent 3 level nerve block with bupivacaine  and triamcinolone . All 3 levels were treated for increasing likelihood of success of the procedure. Since the procedure, the patient states that all of his pain in this region is gone. The patient seems to be doing well and has now increased physical activity and is undergoing physical therapy home. His only complaint currently is orthostatic hypotension which is being investigated.   We discussed the potential for recurrence of of his intercostal pain potential for repeat of the paravertebral nerve block were possibly cryoablation for neurolysis of this T9 region. After discussing both procedures in detail, we will wait for the potential of the pain to recur and then have another discussion of whether or not he wants to continue with the paravertebral nerve block or possibly have a more definitive therapy with cryroneurolysis    Allergies: Sulfa antibiotics  Medications: Prior to Admission medications   Medication Sig Start Date End Date Taking? Authorizing Provider  ascorbic acid  (VITAMIN C ) 500 MG tablet Take 1 tablet (500 mg total) by mouth daily. 02/02/24   Fausto Burnard LABOR, DO  B Complex Vitamins (VITAMIN-B COMPLEX) TABS Take 1 tablet by mouth daily with breakfast.    [provider]  binimetinib  (MEKTOVI ) 15 MG tablet Take 3 tablets (45 mg total) by mouth 2 (two) times daily. 01/10/24   [name] [name], MD  calcium carbonate (OSCAL) 1500 (600 Ca) MG TABS tablet Take 1,200 mg by mouth 2 (two) times daily with a meal.    [provider]  celecoxib  (CELEBREX ) 200 MG capsule TAKE 1 CAPSULE BY MOUTH 2 TIMES A DAY  01/30/24   Pickenpack-Cousar, Fannie SAILOR, NP  ciprofloxacin (CIPRO) 500 MG tablet Take 500 mg by mouth 2 (two) times daily. 02/11/24   [provider]  cyanocobalamin (VITAMIN B12) 1000 MCG tablet Take 1,000 mcg by mouth daily.    [provider]  encorafenib  (BRAFTOVI ) 75 MG capsule Take 6 capsules (450 mg total) by mouth daily. 01/10/24   [name] [name], MD  feeding supplement, GLUCERNA SHAKE, (GLUCERNA SHAKE) LIQD Take 237 mLs by mouth 3 (three) times daily between meals. 02/01/24   Fausto Burnard LABOR, DO  ferrous sulfate  325 (65 FE) MG EC tablet Take 1 tablet (325 mg total) by mouth daily as needed. Patient taking differently: Take 325 mg by mouth every other day. 12/03/23   Pickenpack-Cousar, Athena N, NP  furosemide  (LASIX ) 20 MG tablet Take 1 tablet (20 mg total) by mouth daily as needed. For weight gain or swelling 02/01/24 01/31/25  Fausto Burnard A, DO  leptospermum manuka honey (MEDIHONEY) PSTE paste Apply 1 Application topically daily. 02/02/24   Fausto Burnard LABOR, DO  loperamide  (IMODIUM  A-D) 2 MG tablet Take 1 tablet (2 mg total) by mouth 4 (four) times daily as needed for diarrhea or loose stools. 01/17/24   Mannie Pac T, DO  losartan  (COZAAR ) 25 MG tablet Take 1 tablet (25 mg total) by mouth daily. 02/05/24 05/05/24  Glena Harlene HERO, FNP  Nystatin (GERHARDT'S BUTT CREAM) CREA Apply 1 Application topically 2 (two) times daily. 02/01/24   Fausto Burnard A, DO  ondansetron  (ZOFRAN -ODT) 4 MG disintegrating tablet Take 1 tablet (4 mg total) by mouth every  8 (eight) hours as needed for nausea or vomiting. 01/17/24   Mannie Pac T, DO  pantoprazole  (PROTONIX ) 40 MG tablet Take 1 tablet (40 mg total) by mouth 2 (two) times daily. 01/08/24 02/07/24  Akula, Vijaya, MD  POTASSIUM PO Take 1 tablet by mouth daily with breakfast.    [provider]  Probiotic Product (PROBIOTIC PEARLS ADVANTAGE PO) Take 1 capsule by mouth in the morning.    [provider]  protein  supplement shake (PREMIER PROTEIN) LIQD Take 11 oz by mouth See admin instructions. Drink one to two cartons daily    [provider]  silodosin  (RAPAFLO ) 8 MG CAPS capsule Take 8 mg by mouth daily. 07/04/23   [provider]  Wound Cleansers (VASHE CLEANSING) SOLN Apply 1 application  topically as needed. With dressing changes 02/01/24   Fausto Burnard LABOR, DO     Vital Signs: BP (!) 156/66 (BP Location: Left Arm, Patient Position: Sitting, Cuff Size: Normal)   Pulse 65   Temp 98 F (36.7 C) (Oral)   Resp 18   SpO2 95%   Physical Exam  Imaging: No results found.  Labs:  CBC: Recent Labs    01/27/24 0745 01/29/24 1541 01/30/24 1241 01/31/24 0500  WBC 4.1 4.2 4.5 5.3  HGB 11.1* 11.6* 11.1* 10.6*  HCT 34.4* 36.3* 34.1* 32.5*  PLT 142* 113* 101* 85*    COAGS: Recent Labs    12/30/23 0402 01/20/24 1003  INR 0.9 1.1    BMP: Recent Labs    01/27/24 1249 01/29/24 1048 01/30/24 1241 02/05/24 1533  NA 137 137 137 140  K 4.5 4.1 4.2 4.5  CL 106 104 105 109  CO2 22 23 21* 22  GLUCOSE 113* 101* 99 93  BUN 20 16 18 19   CALCIUM 8.3* 8.7* 8.4* 9.0  CREATININE 0.86 0.86 0.90 0.93  GFRNONAA >60 >60 >60 >60    LIVER FUNCTION TESTS: Recent Labs    01/22/24 2125 01/24/24 0218 01/27/24 1249 01/29/24 1048  BILITOT 1.0 0.9 0.8 0.6  AST 39 20 22 23   ALT 30 23 23 20   ALKPHOS 60 56 59 62  PROT 6.1* 5.7* 5.4* 6.3*  ALBUMIN 2.9* 2.7* 2.8* 3.2*    Assessment and Plan:  No further intervention planned at this time as the paravertebral nerve block was successful and the pain is gone.  The patient will call prn should his pain recur.  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

## 2024-02-20 ENCOUNTER — Encounter: Payer: Self-pay | Admitting: Emergency Medicine

## 2024-02-20 ENCOUNTER — Ambulatory Visit (INDEPENDENT_AMBULATORY_CARE_PROVIDER_SITE_OTHER): Admitting: Emergency Medicine

## 2024-02-20 VITALS — BP 144/64 | HR 71 | Ht 73.0 in | Wt 190.0 lb

## 2024-02-20 DIAGNOSIS — I509 Heart failure, unspecified: Secondary | ICD-10-CM | POA: Diagnosis not present

## 2024-02-20 DIAGNOSIS — J90 Pleural effusion, not elsewhere classified: Secondary | ICD-10-CM | POA: Diagnosis not present

## 2024-02-20 DIAGNOSIS — C3492 Malignant neoplasm of unspecified part of left bronchus or lung: Secondary | ICD-10-CM | POA: Diagnosis not present

## 2024-02-20 DIAGNOSIS — R918 Other nonspecific abnormal finding of lung field: Secondary | ICD-10-CM

## 2024-02-20 NOTE — Assessment & Plan Note (Signed)
 May be a candidate at some point for Pleurx catheter depending on course.  Will follow with him

## 2024-02-20 NOTE — Assessment & Plan Note (Signed)
 New LV dysfunction after recent hospitalization for E. coli bacteremia.  Question whether this was global dysfunction due to sepsis versus acute dysfunction from primary cardiac cause.  He has followed with cardiology, has better volume status currently.  Will likely have a repeat echocardiogram going forward to assess for any recovery

## 2024-02-20 NOTE — Progress Notes (Signed)
   Subjective:    Patient ID: Chad Moreno, male    DOB: 01/10/1941, 83 y.o.   MRN: [id number]  HPI  ROV 07/18/2023 --returns today for follow-up.  He is 43 with history of prostate cancer and hypertension, bilateral presumed stage I adenocarcinoma diagnosed by navigational bronchoscopy 08/2022 and treated with SBRT.  Subsequent imaging revealed evidence of recurrence with a moderate left pleural effusion, some increase in his left upper lobe nodular opacity, bilateral supraclavicular, subpectoral, left axillary and upper abdominal lymphadenopathy.  The pleural effusion was tapped and was malignant.  Following Dr. [name] and initiating in encorafenib  and binimetinib  soon.  There is been some discussion/consideration for possible Pleurx catheter if his pleural effusion continues to recur on therapy.  He is having exertional SOB, some pain in his R back and flank, intermittent.   ROV 02/20/2024 --time is 81 with history of hypertension, prostate cancer, stage I adenocarcinoma (bilateral) that was diagnosed by navigational bronchoscopy 08/2022 treated with SBRT, then recurrence with a malignant left pleural effusion.  Following closely with oncology.  He was admitted in July for small bowel obstruction with UTI, acute renal insufficiency, and ESBL positive E. coli bacteremia, then again at the end of July for shortness of breath with decompensation of acute systolic CHF (? Due to the recent sepsis?). He is back on cancer treatment with Dr [name].  He is currently doing PT, feels a bit stronger, seems to be tolerating the modified cancer regimen. He was not sent home on O2.    Review of Systems As per HPI      Objective:   Physical Exam  Vitals:   02/20/24 0905  BP: (!) 144/64  Pulse: 71  SpO2: 100%  Weight: 190 lb (86.2 kg)  Height: 6' 1 (1.854 m)   Gen: Pleasant, well-nourished, in no distress,  normal affect  ENT: No lesions,  mouth clear,  oropharynx clear, no postnasal drip  Neck:  No JVD, hoarse voice  Lungs: No use of accessory muscles, no crackles or wheezing on normal respiration, no wheeze on forced expiration  Cardiovascular: RRR, heart sounds normal, no murmur or gallops, no peripheral edema  Musculoskeletal: No deformities, no cyanosis or clubbing  Neuro: alert, awake, non focal  Skin: Warm, no lesions or rash     Assessment & Plan:  Acute CHF (congestive heart failure) (HCC) New LV dysfunction after recent hospitalization for E. coli bacteremia.  Question whether this was global dysfunction due to sepsis versus acute dysfunction from primary cardiac cause.  He has followed with cardiology, has better volume status currently.  Will likely have a repeat echocardiogram going forward to assess for any recovery  Non-small cell cancer of left lung [facility]) Following with Dr. [name]    Pleural effusion May be a candidate at some point for Pleurx catheter depending on course.  Will follow with him     Lamar Chris, MD, PhD 02/20/2024, 5:16 PM [facility] [phone] or if no answer before 7:00PM call [phone] For any issues after 7:00PM please call eLink [phone]

## 2024-02-20 NOTE — Assessment & Plan Note (Signed)
Following with Dr. Julien Nordmann

## 2024-02-20 NOTE — Patient Instructions (Signed)
 We reviewed your recent hospitalization and testing. Follow-up with cardiology as planned. Hopefully there will be improvement in heart function on your next echocardiogram Follow Dr. [name] and continue your cancer therapy as planned. Will not start any new inhaled medication at this time Follow Dr. Shelah in 6 months, sooner if you have any problems.

## 2024-02-21 DIAGNOSIS — L89313 Pressure ulcer of right buttock, stage 3: Secondary | ICD-10-CM | POA: Diagnosis not present

## 2024-02-21 DIAGNOSIS — I5020 Unspecified systolic (congestive) heart failure: Secondary | ICD-10-CM | POA: Diagnosis not present

## 2024-02-21 DIAGNOSIS — C3492 Malignant neoplasm of unspecified part of left bronchus or lung: Secondary | ICD-10-CM | POA: Diagnosis not present

## 2024-02-21 DIAGNOSIS — I951 Orthostatic hypotension: Secondary | ICD-10-CM | POA: Diagnosis not present

## 2024-02-21 DIAGNOSIS — K5730 Diverticulosis of large intestine without perforation or abscess without bleeding: Secondary | ICD-10-CM | POA: Diagnosis not present

## 2024-02-21 DIAGNOSIS — L89323 Pressure ulcer of left buttock, stage 3: Secondary | ICD-10-CM | POA: Diagnosis not present

## 2024-02-21 DIAGNOSIS — L89153 Pressure ulcer of sacral region, stage 3: Secondary | ICD-10-CM | POA: Diagnosis not present

## 2024-02-21 DIAGNOSIS — N170 Acute kidney failure with tubular necrosis: Secondary | ICD-10-CM | POA: Diagnosis not present

## 2024-02-21 DIAGNOSIS — D509 Iron deficiency anemia, unspecified: Secondary | ICD-10-CM | POA: Diagnosis not present

## 2024-02-21 DIAGNOSIS — I110 Hypertensive heart disease with heart failure: Secondary | ICD-10-CM | POA: Diagnosis not present

## 2024-02-21 DIAGNOSIS — C7951 Secondary malignant neoplasm of bone: Secondary | ICD-10-CM | POA: Diagnosis not present

## 2024-02-21 DIAGNOSIS — D630 Anemia in neoplastic disease: Secondary | ICD-10-CM | POA: Diagnosis not present

## 2024-02-25 ENCOUNTER — Inpatient Hospital Stay (HOSPITAL_BASED_OUTPATIENT_CLINIC_OR_DEPARTMENT_OTHER): Admitting: Internal Medicine

## 2024-02-25 ENCOUNTER — Inpatient Hospital Stay: Attending: Internal Medicine

## 2024-02-25 VITALS — BP 128/79 | HR 75 | Temp 97.6°F | Resp 17 | Ht 73.0 in | Wt 190.0 lb

## 2024-02-25 DIAGNOSIS — I5022 Chronic systolic (congestive) heart failure: Secondary | ICD-10-CM | POA: Insufficient documentation

## 2024-02-25 DIAGNOSIS — Z8042 Family history of malignant neoplasm of prostate: Secondary | ICD-10-CM | POA: Insufficient documentation

## 2024-02-25 DIAGNOSIS — C7951 Secondary malignant neoplasm of bone: Secondary | ICD-10-CM | POA: Diagnosis not present

## 2024-02-25 DIAGNOSIS — C3431 Malignant neoplasm of lower lobe, right bronchus or lung: Secondary | ICD-10-CM | POA: Diagnosis not present

## 2024-02-25 DIAGNOSIS — N401 Enlarged prostate with lower urinary tract symptoms: Secondary | ICD-10-CM | POA: Diagnosis not present

## 2024-02-25 DIAGNOSIS — C3492 Malignant neoplasm of unspecified part of left bronchus or lung: Secondary | ICD-10-CM | POA: Diagnosis not present

## 2024-02-25 DIAGNOSIS — K219 Gastro-esophageal reflux disease without esophagitis: Secondary | ICD-10-CM | POA: Insufficient documentation

## 2024-02-25 DIAGNOSIS — I110 Hypertensive heart disease with heart failure: Secondary | ICD-10-CM | POA: Insufficient documentation

## 2024-02-25 DIAGNOSIS — Z923 Personal history of irradiation: Secondary | ICD-10-CM | POA: Insufficient documentation

## 2024-02-25 DIAGNOSIS — Z791 Long term (current) use of non-steroidal anti-inflammatories (NSAID): Secondary | ICD-10-CM | POA: Insufficient documentation

## 2024-02-25 DIAGNOSIS — Z79899 Other long term (current) drug therapy: Secondary | ICD-10-CM | POA: Insufficient documentation

## 2024-02-25 DIAGNOSIS — Z87891 Personal history of nicotine dependence: Secondary | ICD-10-CM | POA: Diagnosis not present

## 2024-02-25 DIAGNOSIS — C3490 Malignant neoplasm of unspecified part of unspecified bronchus or lung: Secondary | ICD-10-CM

## 2024-02-25 LAB — CMP (CANCER CENTER ONLY)
ALT: 9 U/L (ref 0–44)
AST: 14 U/L — ABNORMAL LOW (ref 15–41)
Albumin: 3.8 g/dL (ref 3.5–5.0)
Alkaline Phosphatase: 63 U/L (ref 38–126)
Anion gap: 7 (ref 5–15)
BUN: 25 mg/dL — ABNORMAL HIGH (ref 8–23)
CO2: 25 mmol/L (ref 22–32)
Calcium: 8.9 mg/dL (ref 8.9–10.3)
Chloride: 107 mmol/L (ref 98–111)
Creatinine: 0.95 mg/dL (ref 0.61–1.24)
GFR, Estimated: >60 mL/min (ref >60)
Glucose, Bld: 118 mg/dL — ABNORMAL HIGH (ref 70–99)
Potassium: 4.3 mmol/L (ref 3.5–5.1)
Sodium: 139 mmol/L (ref 135–145)
Total Bilirubin: 0.4 mg/dL (ref 0.0–1.2)
Total Protein: 6.7 g/dL (ref 6.5–8.1)

## 2024-02-25 LAB — IRON AND IRON BINDING CAPACITY (CC-WL,HP ONLY)
Iron: 59 ug/dL (ref 45–182)
Saturation Ratios: 20 % (ref 17.9–39.5)
TIBC: 298 ug/dL (ref 250–450)
UIBC: 239 ug/dL (ref 117–376)

## 2024-02-25 LAB — CBC WITH DIFFERENTIAL (CANCER CENTER ONLY)
Abs Immature Granulocytes: 0.03 K/uL (ref 0.00–0.07)
Basophils Absolute: 0 K/uL (ref 0.0–0.1)
Basophils Relative: 1 %
Eosinophils Absolute: 0.1 K/uL (ref 0.0–0.5)
Eosinophils Relative: 1 %
HCT: 31.7 % — ABNORMAL LOW (ref 39.0–52.0)
Hemoglobin: 10.5 g/dL — ABNORMAL LOW (ref 13.0–17.0)
Immature Granulocytes: 1 %
Lymphocytes Relative: 12 %
Lymphs Abs: 0.7 K/uL (ref 0.7–4.0)
MCH: 29 pg (ref 26.0–34.0)
MCHC: 33.1 g/dL (ref 30.0–36.0)
MCV: 87.6 fL (ref 80.0–100.0)
Monocytes Absolute: 0.4 K/uL (ref 0.1–1.0)
Monocytes Relative: 8 %
Neutro Abs: 4.5 K/uL (ref 1.7–7.7)
Neutrophils Relative %: 77 %
Platelet Count: 160 K/uL (ref 150–400)
RBC: 3.62 MIL/uL — ABNORMAL LOW (ref 4.22–5.81)
RDW: 16.2 % — ABNORMAL HIGH (ref 11.5–15.5)
WBC Count: 5.8 K/uL (ref 4.0–10.5)
nRBC: 0 % (ref 0.0–0.2)

## 2024-02-25 LAB — FERRITIN: Ferritin: 503 ng/mL — ABNORMAL HIGH (ref 24–336)

## 2024-02-25 LAB — FOLATE: Folate: >20 ng/mL (ref >5.9)

## 2024-02-25 LAB — VITAMIN B12: Vitamin B-12: >4000 pg/mL — ABNORMAL HIGH (ref 180–914)

## 2024-02-25 NOTE — Progress Notes (Signed)
 Chad Moreno [phone]   Fax:([phone]  OFFICE PROGRESS NOTE  Chad Santos, MD [address] [place] [address]  DIAGNOSIS:  Recurrent/metastatic (T1c, N3, M1b) non-small cell lung cancer, adenocarcinoma initially diagnosed as early stage disease, stage I in March 2024 involving the right lower lobe and left upper lobe status post SBRT completed in May 2024.  The patient presented with recurrent and metastatic disease to bilateral supraclavicular, subpectoral, left axillary as well as upper abdominal lymphadenopathy and malignant left pleural effusion in December 2024 with confirmation of malignancy from the pleural effusion.   Biomarker Findings HRD signature - Cannot Be Determined Microsatellite status - Cannot Be Determined ? Tumor Mutational Burden - Cannot Be Determined Genomic Findings For a complete list of the genes assayed, please refer to the Appendix. BRAF V600E CHEK2 R474C CTNNB1 S45C DNMT3A W330* PPARG E3K 7 Disease relevant genes with no reportable alterations: ALK, EGFR, ERBB2, KRAS, MET, RET, ROS1  PDL1 TPS 1%  PRIOR THERAPY: None  CURRENT THERAPY:  Encorafenib  450 mg p.o. daily and binimetinib  45 mg p.o. twice daily.  First dose July 22, 2023.  INTERVAL HISTORY: Chad Moreno 83 y.o. male returns to the clinic today for follow-up visit accompanied by his wife. Discussed the use of AI scribe software for clinical note transcription with the patient, who gave verbal consent to proceed.  History of Present Illness Chad Moreno is an 83 year old male with recurrent metastatic adenocarcinoma of the lung who presents for evaluation and recommendation regarding his condition. He is accompanied by his wife.  He has a history of recurrent metastatic adenocarcinoma of the lung, initially diagnosed as stage I in March 2025. Treatment began with targeted therapy, specifically encorafenib  and binimetinib , in  January 2025. This treatment has been interrupted multiple times due to side effects and hospitalizations. Currently, he is on a reduced dose of 150 mg of encorafenib  (two capsules) and 45 mg of binimetinib  (three capsules) once daily. He feels good over the last three days and was able to drive himself to the appointment.  He experiences dizziness and lightheadedness upon standing, which has improved over the past two to three days. No dizziness or lightheadedness upon standing in the last two to three days.  He has a history of a decubitus ulcer on both sides, which is healing well with weekly visits from a nurse.  He had a urinary tract infection a couple of weeks ago, which was treated with antibiotics and has since resolved.  He has a history of congestive heart failure, for which he was hospitalized at home for three days and received IV antibiotics for a blood infection. He is currently taking losartan  25 mg daily for his heart condition. He is scheduled for a follow-up echocardiogram in 90 days to assess his heart function.     MEDICAL HISTORY: Past Medical History:  Diagnosis Date   Acute hypoxemic respiratory failure (HCC) 01/23/2024   Acute systolic CHF (congestive heart failure) (HCC) 01/24/2024   Age-related cataract of left eye    Amblyopia, right eye    Arthritis    Bacteremia due to Escherichia coli 02/01/2024   Cancer (HCC)    CAP (community acquired pneumonia) 01/23/2024   Dysuria    ED (erectile dysfunction)    Elevated PSA    Epidermal cyst    Family history of prostate cancer    Gout    Hip pain    Histoplasmosis  Hyperlipidemia    Hypermetropia, left eye    Hypertension    Hypokalemia 01/25/2024   Impaired glucose tolerance    Joint pain    Left shoulder pain    Lower back pain    Melanocytic nevi, unspecified    Nummular dermatitis    Ocular hypertension    Palmar fascial fibromatosis    Prostate cancer screening    Regular astigmatism, right eye     Senile nuclear sclerosis    SIRS (systemic inflammatory response syndrome) (HCC) 01/23/2024    ALLERGIES:  is allergic to sulfa antibiotics and ciprofloxacin.  MEDICATIONS:  Current Outpatient Medications  Medication Sig Dispense Refill   ascorbic acid  (VITAMIN C ) 500 MG tablet Take 1 tablet (500 mg total) by mouth daily.     B Complex Vitamins (VITAMIN-B COMPLEX) TABS Take 1 tablet by mouth daily with breakfast.     [Paused] binimetinib  (MEKTOVI ) 15 MG tablet Take 3 tablets (45 mg total) by mouth 2 (two) times daily. 180 tablet 3   calcium carbonate (OSCAL) 1500 (600 Ca) MG TABS tablet Take 1,200 mg by mouth 2 (two) times daily with a meal.     celecoxib  (CELEBREX ) 200 MG capsule TAKE 1 CAPSULE BY MOUTH 2 TIMES A DAY 60 capsule 3   ciprofloxacin (CIPRO) 500 MG tablet Take 500 mg by mouth 2 (two) times daily.     cyanocobalamin (VITAMIN B12) 1000 MCG tablet Take 1,000 mcg by mouth daily.     [Paused] encorafenib  (BRAFTOVI ) 75 MG capsule Take 6 capsules (450 mg total) by mouth daily. 180 capsule 3   feeding supplement, GLUCERNA SHAKE, (GLUCERNA SHAKE) LIQD Take 237 mLs by mouth 3 (three) times daily between meals.     ferrous sulfate  325 (65 FE) MG EC tablet Take 1 tablet (325 mg total) by mouth daily as needed. (Patient taking differently: Take 325 mg by mouth every other day.)     furosemide  (LASIX ) 20 MG tablet Take 1 tablet (20 mg total) by mouth daily as needed. For weight gain or swelling 30 tablet 11   leptospermum manuka honey (MEDIHONEY) PSTE paste Apply 1 Application topically daily. 44 mL 1   loperamide  (IMODIUM  A-D) 2 MG tablet Take 1 tablet (2 mg total) by mouth 4 (four) times daily as needed for diarrhea or loose stools. 30 tablet 0   losartan  (COZAAR ) 25 MG tablet Take 1 tablet (25 mg total) by mouth daily. (Patient not taking: Reported on 02/20/2024) 90 tablet 2   Nystatin (GERHARDT'S BUTT CREAM) CREA Apply 1 Application topically 2 (two) times daily. 1 each 1   ondansetron   (ZOFRAN -ODT) 4 MG disintegrating tablet Take 1 tablet (4 mg total) by mouth every 8 (eight) hours as needed for nausea or vomiting. 20 tablet 0   pantoprazole  (PROTONIX ) 40 MG tablet Take 1 tablet (40 mg total) by mouth 2 (two) times daily. 60 tablet 0   POTASSIUM PO Take 1 tablet by mouth daily with breakfast.     Probiotic Product (PROBIOTIC PEARLS ADVANTAGE PO) Take 1 capsule by mouth in the morning.     protein supplement shake (PREMIER PROTEIN) LIQD Take 11 oz by mouth See admin instructions. Drink one to two cartons daily     silodosin  (RAPAFLO ) 8 MG CAPS capsule Take 8 mg by mouth daily.     Wound Cleansers (VASHE CLEANSING) SOLN Apply 1 application  topically as needed. With dressing changes 250 mL 1   No current facility-administered medications for this visit.    SURGICAL  HISTORY:  Past Surgical History:  Procedure Laterality Date   BRONCHIAL BIOPSY  08/27/2022   Procedure: BRONCHIAL BIOPSIES;  Surgeon: Chad Lamar RAMAN, MD;  Location: [facility] ENDOSCOPY;  Service: Pulmonary;;   BRONCHIAL BRUSHINGS  08/27/2022   Procedure: BRONCHIAL BRUSHINGS;  Surgeon: Chad Lamar RAMAN, MD;  Location[facility];  Service: Pulmonary;;   BRONCHIAL NEEDLE ASPIRATION BIOPSY  08/27/2022   Procedure: BRONCHIAL NEEDLE ASPIRATION BIOPSIES;  Surgeon: Chad Lamar RAMAN, MD;  Location: [facility] ENDOSCOPY;  Service: Pulmonary;;   CONSTRICTING FINGER RING EXCISION Left    CYST REMOVAL TRUNK Left    left posterior shoulder   FIDUCIAL MARKER PLACEMENT  08/27/2022   Procedure: FIDUCIAL MARKER PLACEMENT;  Surgeon: Chad Lamar RAMAN, MD;  Location: [facility] ENDOSCOPY;  Service: Pulmonary;;   GOLD SEED IMPLANT N/A 10/02/2022   Procedure: GOLD SEED IMPLANT;  Surgeon: Chad Arlyss CROME, MD;  Location[facility];  Service: Urology;  Laterality: N/A;   index finger Left    IR FACET JT INJ C/T  2ND LEVEL RIGHT W/FL/CT  01/20/2024   IR FACET JT INJ C/T  SINGLE LEVEL RIGHT W/FL/CT  01/20/2024   IR FACET JT INJ C/T 3RD PLUS LEVEL RIGHT  W/FL/CT  01/20/2024   IR THORACENTESIS ASP PLEURAL SPACE W/IMG GUIDE  06/14/2023   Prostate needle biopsy     SPACE OAR INSTILLATION N/A 10/02/2022   Procedure: SPACE OAR INSTILLATION;  Surgeon: Chad Arlyss CROME, MD;  Location: [facility];  Service: Urology;  Laterality: N/A;    REVIEW OF SYSTEMS:  Constitutional: positive for fatigue Eyes: negative Ears, nose, mouth, throat, and face: negative Respiratory: negative Cardiovascular: negative Gastrointestinal: negative Genitourinary:negative Integument/breast: negative Hematologic/lymphatic: negative Musculoskeletal:negative Neurological: negative Behavioral/Psych: negative Endocrine: negative Allergic/Immunologic: negative   PHYSICAL EXAMINATION: General appearance: alert, cooperative, fatigued, and no distress Head: Normocephalic, without obvious abnormality, atraumatic Neck: no adenopathy, no JVD, supple, symmetrical, trachea midline, and thyroid  not enlarged, symmetric, no tenderness/mass/nodules Lymph nodes: Cervical, supraclavicular, and axillary nodes normal. Resp: clear to auscultation bilaterally Back: symmetric, no curvature. ROM normal. No CVA tenderness. Cardio: regular rate and rhythm, S1, S2 normal, no murmur, click, rub or gallop GI: soft, non-tender; bowel sounds normal; no masses,  no organomegaly Extremities: extremities normal, atraumatic, no cyanosis or edema Neurologic: Alert and oriented X 3, normal strength and tone. Normal symmetric reflexes. Normal coordination and gait  ECOG PERFORMANCE STATUS: 1 - Symptomatic but completely ambulatory  Blood pressure 128/79, pulse 75, temperature 97.6 F (36.4 C), temperature source Temporal, resp. rate 17, height 6' 1 (1.854 m), weight 190 lb (86.2 kg), SpO2 100%.  LABORATORY DATA: Lab Results  Component Value Date   WBC 5.8 02/25/2024   HGB 10.5 (L) 02/25/2024   HCT 31.7 (L) 02/25/2024   MCV 87.6 02/25/2024   PLT 160 02/25/2024      Chemistry       Component Value Date/Time   NA 140 02/05/2024 1533   K 4.5 02/05/2024 1533   CL 109 02/05/2024 1533   CO2 22 02/05/2024 1533   BUN 19 02/05/2024 1533   CREATININE 0.93 02/05/2024 1533   CREATININE 1.08 12/18/2023 1100      Component Value Date/Time   CALCIUM 9.0 02/05/2024 1533   ALKPHOS 62 01/29/2024 1048   AST 23 01/29/2024 1048   AST 15 12/18/2023 1100   ALT 20 01/29/2024 1048   ALT 12 12/18/2023 1100   BILITOT 0.6 01/29/2024 1048   BILITOT 0.3 12/18/2023 1100  RADIOGRAPHIC STUDIES: No results found.    ASSESSMENT AND PLAN: This is a very pleasant 83 years old white male with Recurrent/metastatic (T1c, N3, M1b) non-small cell lung cancer, adenocarcinoma initially diagnosed as early stage disease, stage I in March 2024 involving the right lower lobe and left upper lobe status post SBRT completed in May 2024.  The patient presented with recurrent and metastatic disease to bilateral supraclavicular, subpectoral, left axillary as well as upper abdominal lymphadenopathy and malignant left pleural effusion in December 2024 with confirmation of malignancy from the pleural effusion. Molecular studies by foundation 1 showed positive BRAF V600E mutation and PD-L1 expression of 1%. The patient is currently on treatment with encorafenib  450 mg p.o. twice daily in addition to binimetinib  45 mg milligram p.o. twice daily.  First dose was July 22, 2023. He continues to tolerate this treatment fairly well with no concerning adverse effects.  He is currently on a 50% dose reduction of encorafenib  and binimetinib  and tolerating it fairly well. Assessment and Plan Assessment & Plan Metastatic lung adenocarcinoma Initially diagnosed as stage I in March 2025. Currently on reduced dose of encorafenib  and binimetinib  due to previous side effects and hospitalizations. He reports feeling well on the current regimen. Possibility of congestive heart failure being related to the targeted  therapy, but infection is also a potential cause. - Continue encorafenib  and binimetinib  at reduced dose. - Order scan in one month to assess cancer status. - Schedule follow-up appointment after the scan. - Consider switching to chemotherapy and immunotherapy if scan indicates progression or if congestive heart failure is related to the targeted therapy.  Congestive heart failure Possibly related to previous blood infection or targeted therapy. Currently well-managed with no recent symptoms of dizziness or lightheadedness. Infection is a potential cause as discussed with critical care. - Continue current heart medications as prescribed by cardiologist. - Plan for echocardiogram in 90 days.  Bilateral decubitus ulcers Currently healing well with weekly nursing care. - Continue weekly nursing care for wound management. The patient was advised to call immediately if he has any concerning symptoms in the interval.   The patient voices understanding of current disease status and treatment options and is in agreement with the current care plan.  All questions were answered. The patient knows to call the clinic with any problems, questions or concerns. We can certainly see the patient much sooner if necessary.  The total time spent in the appointment was 30 minutes.  Disclaimer: This note was dictated with voice recognition software. Similar sounding words can inadvertently be transcribed and may not be corrected upon review.

## 2024-02-26 ENCOUNTER — Other Ambulatory Visit: Payer: Self-pay

## 2024-02-26 ENCOUNTER — Other Ambulatory Visit (HOSPITAL_COMMUNITY): Payer: Self-pay

## 2024-02-26 ENCOUNTER — Encounter: Payer: Self-pay | Admitting: Emergency Medicine

## 2024-02-26 ENCOUNTER — Ambulatory Visit: Attending: Cardiovascular Disease | Admitting: Emergency Medicine

## 2024-02-26 VITALS — BP 134/76 | HR 74 | Ht 73.0 in | Wt 188.0 lb

## 2024-02-26 DIAGNOSIS — C3492 Malignant neoplasm of unspecified part of left bronchus or lung: Secondary | ICD-10-CM | POA: Insufficient documentation

## 2024-02-26 DIAGNOSIS — I5020 Unspecified systolic (congestive) heart failure: Secondary | ICD-10-CM | POA: Diagnosis not present

## 2024-02-26 NOTE — Progress Notes (Signed)
 Cardiology Office Note:    Date:  02/26/2024  ID:  Chad Moreno, DOB [birth year], MRN [id number] PCP: [name], MD  [facility] Providers Cardiologist:  Peter Swaziland, MD Cardiology APP:  [name], NP       Patient Profile:       Chief Complaint: 2-week follow-up History of Present Illness:  Chad Moreno is a 83 y.o. male with visit-pertinent history of metastatic nSCLC currently on immunotherapy, chronic thrombocytopenia, histoplasmosis, hypertension, hyperlipidemia, systolic heart failure  Echocardiogram 06/2023 showed LVEF 55% with no RWMA. s/p SRB [place] completed 10/2022; onencorafenib and binimetinib  started on 06/2023.  Admitted 12/30/2023 for SBO/GI bleed.  Conservative management was recommended per general surgery.  GI bleed felt to be due to cecal colitis/diverticulosis.  Received PRBCs and IV PPI.  Plan for outpatient GI EGD/colonoscopy.  Course complicated by ESBL UTI treated with antibiotics.  He was discharged home in stable condition  He was readmitted on 01/17/2024 with dyspnea initially and required BiPAP therapy.  Echocardiogram showed LVEF of 30%, RWMA's, mild LVH, RV mildly reduced.  Questionable Takotsubo cardiomyopathy versus LAD infarct versus cardiomyopathy from immunotherapy.  At immunotherapy was held.  Cardiology was consulted.  His high sensitive troponins were mildly elevated but flat and EKG was without acute ST changes.  Blood cultures were positive for a ESBL E. coli and ID was consulted.  He was started on antibiotics and diuresed with IV Lasix  and GDMT was titrated.  Patient wishes not to have invasive procedures.  GDMT was limited by low BP and orthostasis.  He was discharged home with a weight of 188 LBS.  He followed up with oncology on 01/28/2024 and encorafenib  and binimetinib  restarted at reduced doses.   He was last seen for follow-up with heart failure clinic on 02/05/2024.  No shortness of breath with activity or ADLs.  Weight  at home was stable, has not needed Lasix .  Has home health PT and home health RN.  He is without any chest pain and not interested in invasive workup.  Cardiac MRI and ischemic workup were deferred.  He was restarted on losartan  25 mg daily (did experience dizziness on Entresto ).  SGLT2i was avoided given recent UTIs, bacteremia, and sacral wound.  He is likely not ICD candidate with age and recent bacteremia should EF not improved.   Discussed the use of AI scribe software for clinical note transcription with the patient, who gave verbal consent to proceed.  History of Present Illness Chad Moreno is an 83 year old male with lung cancer and newly diagnosed congestive heart failure who presents for follow-up of heart failure management. He is accompanied by his wife, Chad Moreno.  He began immunotherapy in January 2025, with an initial echocardiogram showing an ejection fraction of 50-55%. By January 24, 2024, the ejection fraction decreased to 30%. He has not undergone cardiac catheterization or MRI to investigate this decline.  He exhibits no significant symptoms of heart failure such as leg swelling, weight gain, or dyspnea. His weight is stable at 185-188 pounds, monitored daily. He has taken Lasix  once in the past month. He follows a low sodium diet and limits fluid intake to 60-70 ounces daily.  He exercises daily with a physical therapist, performing walking, band exercises, and leg lifts without chest pain or dyspnea. His pulse ranges from 66 to 74 bpm, and oxygen saturation remains high. He quit smoking in March 1982 and is managing healing sores from a recent hospital stay.  He denies any orthopnea, PND, leg swelling) to be, presyncope.  Review of systems:  Please see the history of present illness. All other systems are reviewed and otherwise negative.      Studies Reviewed:        Echocardiogram 01/24/2024 1. Left ventricular ejection fraction, by estimation, is 30 to 35%. The   left ventricle has moderately decreased function. The left ventricle  demonstrates regional wall motion abnormalities (see scoring  diagram/findings for description). There is mild  left ventricular hypertrophy. Left ventricular diastolic parameters are  indeterminate.   2. Right ventricular systolic function is mildly reduced. The right  ventricular size is normal. Normal RV basal function with apical  hypokinesis   3. Moderate pleural effusion in the left lateral region.   4. The mitral valve was not well visualized. Trivial mitral valve  regurgitation.   5. The aortic valve was not well visualized. Aortic valve regurgitation  is not visualized. No aortic stenosis is present.   6. The inferior vena cava is normal in size with greater than 50%  respiratory variability, suggesting right atrial pressure of 3 mmHg.   Limited echocardiogram 07/19/2023 1. Left ventricular ejection fraction, by estimation, is 55%. The left  ventricle has normal function. The left ventricle has no regional wall  motion abnormalities. Left ventricular diastolic parameters are consistent  with Grade I diastolic dysfunction  (impaired relaxation).   2. Right ventricular systolic function is normal. The right ventricular  size is normal. Tricuspid regurgitation signal is inadequate for assessing  PA pressure.   3. The aortic valve is tricuspid. There is mild calcification of the  aortic valve. Aortic valve regurgitation is trivial. No aortic stenosis is  present.   4. The inferior vena cava is normal in size with greater than 50%  respiratory variability, suggesting right atrial pressure of 3 mmHg.   5. IVC not visualized.   6. Limited echo.   Risk Assessment/Calculations:              Physical Exam:   VS:  BP 134/76   Pulse 74   Ht 6' 1 (1.854 m)   Wt 188 lb (85.3 kg)   SpO2 99%   BMI 24.80 kg/m    Wt Readings from Last 3 Encounters:  02/26/24 188 lb (85.3 kg)  02/25/24 190 lb (86.2 kg)   02/20/24 190 lb (86.2 kg)    GEN: Well nourished, well developed in no acute distress NECK: No JVD; No carotid bruits CARDIAC: RRR, no murmurs, rubs, gallops RESPIRATORY:  Clear to auscultation without rales, wheezing or rhonchi  ABDOMEN: Soft, non-tender, non-distended EXTREMITIES:  No edema; No acute deformity      Assessment and Plan:  Chronic systolic heart failure Diagnosed 12/2023 Echocardiogram 06/2023 with LVEF 50 to 55% Started targeted immunotherapy with encorafenib  and binimetinib  06/2023 Echocardiogram 12/2023 with LVEF 30%, RWMA's - He is not interested in invasive workup.  Deferred cMRI and catheterization. Agreed to medication management - Possible stress-induced cardiomyopathy (d/x with ESBL E. coli bacteremia at time of admission) versus immunotherapy cardiomyopathy versus ischemia - Notes he has had vast improvement in symptoms since his immunotherapy drugs were decreased.  Notes only symptom he experienced was shortness of breath shortly after starting immunotherapy which has resolved - NYHA class I-II and not requiring daily loop diuretic therapy - Today patient appears euvolemic and well compensated on exam.  No dyspnea, orthopnea, PND, leg swelling.  Weight remains stable - Remains active with daily exercises including walking, band  exercises, and leg lifts without exertional symptoms or limitation - Given he has had much improvement we will plan to repeat echocardiogram on 11/1 to reassess his LV function - I will hold off on further GDMT titration today given history of orthostatic hypotension - During recent admission spironolactone  was discontinued in the setting of orthostatic hypotension and Entresto  in the setting of dizziness - Avoid SGLT2i with recent UTIs, bacteremia and sacral wound  - Continue Lasix  20 mg daily as needed and losartan  25 mg daily - Not an advanced therapies candidate  Metastatic lung adenocarcinoma Initially diagnosed 08/2023 Currently on  reduced dose of encorafenib  and binimetinib   - Management per oncology      Dispo:  Return in about 2 months (around 04/27/2024).  Signed, Lum LITTIE Louis, NP

## 2024-02-26 NOTE — Patient Instructions (Signed)
 Medication Instructions:  NO CHANGES   Lab Work: NONE TO BE DONE TODAY.   Testing/Procedures: TO BE DONE THE FIRST WEEK OF NOVEMBER 2025  Your physician has requested that you have an ECHOCARDIOGRAM. Echocardiography is a painless test that uses sound waves to create images of your heart. It provides your doctor with information about the size and shape of your heart and how well your heart's chambers and valves are working. This procedure takes approximately one hour. There are no restrictions for this procedure. Please do NOT wear cologne, perfume, aftershave, or lotions (deodorant is allowed). Please arrive 15 minutes prior to your appointment time.  Please note: We ask at that you not bring children with you during ultrasound (echo/ vascular) testing. Due to room size and safety concerns, children are not allowed in the ultrasound rooms during exams. Our front office staff cannot provide observation of children in our lobby area while testing is being conducted. An adult accompanying a patient to their appointment will only be allowed in the ultrasound room at the discretion of the ultrasound technician under special circumstances. We apologize for any inconvenience.   Follow-Up: At [facility], you and your health needs are our priority.  As part of our continuing mission to provide you with exceptional heart care, our providers are all part of one team.  This team includes your primary Cardiologist (physician) and Advanced Practice Providers or APPs (Physician Assistants and Nurse Practitioners) who all work together to provide you with the care you need, when you need it.  Your next appointment:   1 WEEK AFTER ECHOCARDIOGRAM  Provider:   MADISON FOUNTAIN, NP

## 2024-02-27 DIAGNOSIS — C3492 Malignant neoplasm of unspecified part of left bronchus or lung: Secondary | ICD-10-CM | POA: Diagnosis not present

## 2024-02-27 DIAGNOSIS — C7951 Secondary malignant neoplasm of bone: Secondary | ICD-10-CM | POA: Diagnosis not present

## 2024-03-03 DIAGNOSIS — C7951 Secondary malignant neoplasm of bone: Secondary | ICD-10-CM | POA: Diagnosis not present

## 2024-03-03 DIAGNOSIS — C3492 Malignant neoplasm of unspecified part of left bronchus or lung: Secondary | ICD-10-CM | POA: Diagnosis not present

## 2024-03-05 DIAGNOSIS — C7951 Secondary malignant neoplasm of bone: Secondary | ICD-10-CM | POA: Diagnosis not present

## 2024-03-05 DIAGNOSIS — C3492 Malignant neoplasm of unspecified part of left bronchus or lung: Secondary | ICD-10-CM | POA: Diagnosis not present

## 2024-03-10 ENCOUNTER — Inpatient Hospital Stay (HOSPITAL_BASED_OUTPATIENT_CLINIC_OR_DEPARTMENT_OTHER): Admitting: Nurse Practitioner

## 2024-03-10 ENCOUNTER — Other Ambulatory Visit: Payer: Self-pay

## 2024-03-10 ENCOUNTER — Encounter: Payer: Self-pay | Admitting: Nurse Practitioner

## 2024-03-10 DIAGNOSIS — G893 Neoplasm related pain (acute) (chronic): Secondary | ICD-10-CM

## 2024-03-10 DIAGNOSIS — C7951 Secondary malignant neoplasm of bone: Secondary | ICD-10-CM | POA: Diagnosis not present

## 2024-03-10 DIAGNOSIS — Z515 Encounter for palliative care: Secondary | ICD-10-CM | POA: Diagnosis not present

## 2024-03-10 DIAGNOSIS — C3492 Malignant neoplasm of unspecified part of left bronchus or lung: Secondary | ICD-10-CM | POA: Diagnosis not present

## 2024-03-10 NOTE — Progress Notes (Signed)
 Palliative Medicine [facility]  Telephone:([phone] [phone]) [phone]   Name: Chad Moreno Date: 03/10/2024 MRN: [id number]  DOB: Feb 26, 1941  Patient Care Team: [name], MD as PCP - General (Internal Medicine) Swaziland, Peter M, MD as PCP - Cardiology (Cardiology) Livingston Rigg, MD as Consulting Physician (Dermatology) Vertell Pont, RN as Oncology Nurse Navigator Bouchard, Duwaine BROCKS, RN as Oncology Nurse Navigator Pickenpack-Cousar, Fannie SAILOR, NP as Nurse Practitioner [facility] and Palliative Medicine) [name], NP as Nurse Practitioner (Cardiology)   I connected with Chad Moreno on 03/10/24 at  9:00 AM EDT by telephone and verified that I am speaking with the correct person using two identifiers.   I discussed the limitations, risks, security and privacy concerns of performing an evaluation and management service by telemedicine and the availability of in-person appointments. I also discussed with the patient that there may be a patient responsible charge related to this service. The patient expressed understanding and agreed to proceed.   Other persons participating in the visit and their role in the encounter: Wife   Patient's location: Home  Provider's location: [facility]   INTERVAL HISTORY: Chad Moreno is a 83 y.o. male with  oncologic medical history including recurrent metastatic non-small cell lung cancer initially diagnosed in March 2024 s/p SBRT.  Now with metastatic disease including left axillary, bilateral supraclavicular, abdominal lymphadenopathy, and malignant pleural effusion (05/2023) s/p palliative radiation to right chest completed April 2025.  Patient currently on Encorafenib  and binimetinib .  Palliative is seeing patient for symptom management and goals of care.   SOCIAL HISTORY:     reports that he quit smoking about 44 years ago. His smoking use included cigarettes. He started smoking about 54 years ago. He has a 5  pack-year smoking history. He has never used smokeless tobacco. He reports current alcohol  use. He reports that he does not use drugs.  ADVANCE DIRECTIVES:  Patient to bring in documents to be scanned on file. He reports his wife Thayer Embleton is his Museum/gallery exhibitions officer.   CODE STATUS: DNR  PAST MEDICAL HISTORY: Past Medical History:  Diagnosis Date   Acute hypoxemic respiratory failure (HCC) 01/23/2024   Acute systolic CHF (congestive heart failure) (HCC) 01/24/2024   Age-related cataract of left eye    Amblyopia, right eye    Arthritis    Bacteremia due to Escherichia coli 02/01/2024   Cancer (HCC)    CAP (community acquired pneumonia) 01/23/2024   Dysuria    ED (erectile dysfunction)    Elevated PSA    Epidermal cyst    Family history of prostate cancer    Gout    Hip pain    Histoplasmosis    Hyperlipidemia    Hypermetropia, left eye    Hypertension    Hypokalemia 01/25/2024   Impaired glucose tolerance    Joint pain    Left shoulder pain    Lower back pain    Melanocytic nevi, unspecified    Nummular dermatitis    Ocular hypertension    Palmar fascial fibromatosis    Prostate cancer screening    Regular astigmatism, right eye    Senile nuclear sclerosis    SIRS (systemic inflammatory response syndrome) (HCC) 01/23/2024    ALLERGIES:  is allergic to sulfa antibiotics and ciprofloxacin.  MEDICATIONS:  Current Outpatient Medications  Medication Sig Dispense Refill   ascorbic acid  (VITAMIN C ) 500 MG tablet Take 1 tablet (500 mg total) by mouth daily.  B Complex Vitamins (VITAMIN-B COMPLEX) TABS Take 1 tablet by mouth daily with breakfast.     [Paused] binimetinib  (MEKTOVI ) 15 MG tablet Take 3 tablets (45 mg total) by mouth 2 (two) times daily. 180 tablet 3   calcium carbonate (OSCAL) 1500 (600 Ca) MG TABS tablet Take 1,200 mg by mouth 2 (two) times daily with a meal.     celecoxib  (CELEBREX ) 200 MG capsule TAKE 1 CAPSULE BY MOUTH 2 TIMES A DAY 60 capsule  3   cyanocobalamin (VITAMIN B12) 1000 MCG tablet Take 1,000 mcg by mouth daily.     [Paused] encorafenib  (BRAFTOVI ) 75 MG capsule Take 6 capsules (450 mg total) by mouth daily. 180 capsule 3   ferrous sulfate  325 (65 FE) MG EC tablet Take 1 tablet (325 mg total) by mouth daily as needed. (Patient taking differently: Take 325 mg by mouth every other day.)     furosemide  (LASIX ) 20 MG tablet Take 1 tablet (20 mg total) by mouth daily as needed. For weight gain or swelling 30 tablet 11   leptospermum manuka honey (MEDIHONEY) PSTE paste Apply 1 Application topically daily. 44 mL 1   loperamide  (IMODIUM  A-D) 2 MG tablet Take 1 tablet (2 mg total) by mouth 4 (four) times daily as needed for diarrhea or loose stools. 30 tablet 0   losartan  (COZAAR ) 25 MG tablet Take 1 tablet (25 mg total) by mouth daily. 90 tablet 2   Nystatin (GERHARDT'S BUTT CREAM) CREA Apply 1 Application topically 2 (two) times daily. 1 each 1   ondansetron  (ZOFRAN -ODT) 4 MG disintegrating tablet Take 1 tablet (4 mg total) by mouth every 8 (eight) hours as needed for nausea or vomiting. 20 tablet 0   pantoprazole  (PROTONIX ) 40 MG tablet Take 1 tablet (40 mg total) by mouth 2 (two) times daily. 60 tablet 0   POTASSIUM PO Take 1 tablet by mouth daily with breakfast.     Probiotic Product (PROBIOTIC PEARLS ADVANTAGE PO) Take 1 capsule by mouth in the morning.     protein supplement shake (PREMIER PROTEIN) LIQD Take 11 oz by mouth See admin instructions. Drink one to two cartons daily     silodosin  (RAPAFLO ) 8 MG CAPS capsule Take 8 mg by mouth daily.     Wound Cleansers (VASHE CLEANSING) SOLN Apply 1 application  topically as needed. With dressing changes 250 mL 1   No current facility-administered medications for this visit.    VITAL SIGNS: There were no vitals taken for this visit. There were no vitals filed for this visit.  Estimated body mass index is 24.8 kg/m as calculated from the following:   Height as of 02/26/24: 6' 1  (1.854 m).   Weight as of 02/26/24: 188 lb (85.3 kg).   PERFORMANCE STATUS (ECOG) : 1 - Symptomatic but completely ambulatory   IMPRESSION: Discussed the use of AI scribe software for clinical note transcription with the patient, who gave verbal consent to proceed.  History of Present Illness Amber Guthridge is an 83 year old male with metastatic cancer who I connected by phone for follow-up. His wife is engaged in discussions. Denies concerns of nausea, vomiting, constipation, or diarrhea.   He has stable blood pressure readings around 115-116/60 mmHg and no longer experiences orthostatic hypotension. His energy levels have improved, and he continues with physical therapy, showing increased muscle mass in his legs. He is able to engage in activities such as going out for lunch and driving himself to appointments most recently which  is a big accomplishment for him given health challenges over past several months.   His appetite has improved, and he continues to consume protein drinks. He is weighed daily due to his congestive heart failure and recently had a weight gain of three pounds in one day, which was managed with a single dose of Lasix . He is mindful of sodium intake, primarily eating at home.  Mr. Leonhardt sleeps well at night, although he wakes up a couple of times to use the bathroom but can return to sleep easily. He experiences mild lower back pain after walking and manages it with Tylenol .  His medication regimen includes Celebrex , silodosin  for prostate health, losartan  25 mg at night for congestive heart failure, calcium twice daily, a probiotic, and a reflux medication. B complex, B12, and iron  supplements were discontinued due to elevated levels.  The skin on his buttocks has healed, and he continues to apply cream daily to prevent breakdown due to his sensitive skin. He no longer requires padding.  Wife shares they are planning a family trip to the [employer] with his  family, including his son, daughter, brother, and sister.  All questions answered and support provided.   Goals of Care 12/03/23: We discussed his current illness and what it means in the larger context of his on-going co-morbidities. Natural disease trajectory and expectations were discussed. Mr. Mcqueary and his wife are realistic in their understanding of his metastatic cancer and plan of care.    I empathetically approach discussions regarding advanced directives, CODE STATUS, and healthcare limitations.  Although patient does not have an advanced directive on file he states he does have a completed document.  Encouraged patient to bring in to allow for filing.  Patient states his wife Barnie is his medical decision-maker in the event he is unable to speak for himself.  His desires for natural death with no life-sustaining measures.  DNR/DNI.  No artificial feedings long-term.   Patient and wife are clear in her expressed wishes to continue to treat the treatable allow him every opportunity to continue to thrive.  His quality of life is most important to him.  Will like his symptoms to be managed to minimize discomfort.    I discussed the importance of continued conversation with family and their medical providers regarding overall plan of care and treatment options, ensuring decisions are within the context of the patients values and GOCs. Assessment & Plan  Congestive heart failure Well-managed with low but stable blood pressure at 115-116/60s mmHg. No orthostatic hypotension. Recent 3-pound weight gain likely due to dietary sodium intake. - Continue daily weight monitoring as managed by Cardiology team.  - Administer Lasix  as needed for significant weight gain - Continue losartan  25 mg at night - Encourage dietary sodium restriction  Chronic low back pain Mild pain likely exacerbated by increased physical activity, occurring after walking. Previous bedridden status in July may contribute  to symptoms. - Administer extra strength Tylenol  as needed for pain relief  Benign prostatic hyperplasia with lower urinary tract symptoms Managed with silodosin . - Continue silodosin  for prostate management  Gastroesophageal reflux disease Managed with reflux medication. - Continue current reflux medication  Resolved pressure ulcer Pressure ulcers have healed. Skin remains fragile, requiring ongoing care to prevent recurrence. - Apply cream daily to maintain skin integrity  I will plan to follow-up in 2-3 weeks. Sooner if needed.   Patient expressed understanding and was in agreement with this plan. He also understands that He can call  the clinic at any time with any questions, concerns, or complaints.   Any controlled substances utilized were prescribed in the context of palliative care. PDMP has been reviewed.   I provided 30 minutes of non face-to-face telephone visit time during this encounter, and > 50% was spent counseling as documented under my assessment & plan. 4Visit consisted of counseling and education dealing with the complex and emotionally intense issues of symptom management and palliative care in the setting of serious and potentially life-threatening illness.  Levon Borer, AGPCNP-BC  Palliative Medicine Team/[facility]

## 2024-03-10 NOTE — Progress Notes (Signed)
 Specialty Pharmacy Refill Coordination Note  Chad Moreno is a 83 y.o. male contacted today regarding refills of specialty medication(s) Binimetinib  (MEKTOVI ); Encorafenib  (BRAFTOVI )   Patient requested Delivery   Delivery date: 03/24/24   Verified address: [address] [place], [place] [address]   Medication will be filled on 03/23/24.

## 2024-03-13 ENCOUNTER — Ambulatory Visit (HOSPITAL_COMMUNITY)
Admission: RE | Admit: 2024-03-13 | Discharge: 2024-03-13 | Disposition: A | Discharge status: Home / Self Care | Source: Ambulatory Visit | Attending: Internal Medicine | Admitting: Internal Medicine

## 2024-03-13 ENCOUNTER — Other Ambulatory Visit: Payer: Self-pay

## 2024-03-13 DIAGNOSIS — N3289 Other specified disorders of bladder: Secondary | ICD-10-CM | POA: Diagnosis not present

## 2024-03-13 DIAGNOSIS — C3490 Malignant neoplasm of unspecified part of unspecified bronchus or lung: Secondary | ICD-10-CM | POA: Diagnosis not present

## 2024-03-13 DIAGNOSIS — I700 Atherosclerosis of aorta: Secondary | ICD-10-CM | POA: Diagnosis not present

## 2024-03-13 DIAGNOSIS — J90 Pleural effusion, not elsewhere classified: Secondary | ICD-10-CM | POA: Diagnosis not present

## 2024-03-13 MED ORDER — IOHEXOL 300 MG/ML  SOLN
100.0000 mL | Freq: Once | INTRAMUSCULAR | Status: AC | PRN
Start: 2024-03-13 — End: 2024-03-13
  Administered 2024-03-13: 100 mL via INTRAVENOUS

## 2024-03-23 ENCOUNTER — Other Ambulatory Visit: Payer: Self-pay

## 2024-03-23 DIAGNOSIS — M109 Gout, unspecified: Secondary | ICD-10-CM | POA: Diagnosis not present

## 2024-03-23 DIAGNOSIS — R7302 Impaired glucose tolerance (oral): Secondary | ICD-10-CM | POA: Diagnosis not present

## 2024-03-23 DIAGNOSIS — Z125 Encounter for screening for malignant neoplasm of prostate: Secondary | ICD-10-CM | POA: Diagnosis not present

## 2024-03-23 DIAGNOSIS — Z0189 Encounter for other specified special examinations: Secondary | ICD-10-CM | POA: Diagnosis not present

## 2024-03-23 DIAGNOSIS — E785 Hyperlipidemia, unspecified: Secondary | ICD-10-CM | POA: Diagnosis not present

## 2024-03-23 DIAGNOSIS — I10 Essential (primary) hypertension: Secondary | ICD-10-CM | POA: Diagnosis not present

## 2024-03-24 DIAGNOSIS — C3492 Malignant neoplasm of unspecified part of left bronchus or lung: Secondary | ICD-10-CM | POA: Diagnosis not present

## 2024-03-24 DIAGNOSIS — C7951 Secondary malignant neoplasm of bone: Secondary | ICD-10-CM | POA: Diagnosis not present

## 2024-03-31 ENCOUNTER — Encounter: Payer: Self-pay | Admitting: Nurse Practitioner

## 2024-03-31 ENCOUNTER — Inpatient Hospital Stay (HOSPITAL_BASED_OUTPATIENT_CLINIC_OR_DEPARTMENT_OTHER): Admitting: Nurse Practitioner

## 2024-03-31 ENCOUNTER — Inpatient Hospital Stay: Admitting: Internal Medicine

## 2024-03-31 ENCOUNTER — Inpatient Hospital Stay: Attending: Internal Medicine

## 2024-03-31 VITALS — BP 122/84 | HR 86 | Temp 97.6°F | Resp 17 | Ht 73.0 in | Wt 196.6 lb

## 2024-03-31 DIAGNOSIS — C3492 Malignant neoplasm of unspecified part of left bronchus or lung: Secondary | ICD-10-CM

## 2024-03-31 DIAGNOSIS — C3431 Malignant neoplasm of lower lobe, right bronchus or lung: Secondary | ICD-10-CM | POA: Diagnosis not present

## 2024-03-31 DIAGNOSIS — D630 Anemia in neoplastic disease: Secondary | ICD-10-CM | POA: Diagnosis not present

## 2024-03-31 DIAGNOSIS — C7951 Secondary malignant neoplasm of bone: Secondary | ICD-10-CM | POA: Diagnosis not present

## 2024-03-31 DIAGNOSIS — C3490 Malignant neoplasm of unspecified part of unspecified bronchus or lung: Secondary | ICD-10-CM

## 2024-03-31 DIAGNOSIS — Z515 Encounter for palliative care: Secondary | ICD-10-CM

## 2024-03-31 DIAGNOSIS — Z1509 Genetic susceptibility to other malignant neoplasm: Secondary | ICD-10-CM | POA: Diagnosis not present

## 2024-03-31 LAB — CBC WITH DIFFERENTIAL (CANCER CENTER ONLY)
Abs Immature Granulocytes: 0.03 K/uL (ref 0.00–0.07)
Basophils Absolute: 0 K/uL (ref 0.0–0.1)
Basophils Relative: 1 %
Eosinophils Absolute: 0.1 K/uL (ref 0.0–0.5)
Eosinophils Relative: 1 %
HCT: 32.5 % — ABNORMAL LOW (ref 39.0–52.0)
Hemoglobin: 11.3 g/dL — ABNORMAL LOW (ref 13.0–17.0)
Immature Granulocytes: 1 %
Lymphocytes Relative: 20 %
Lymphs Abs: 1 K/uL (ref 0.7–4.0)
MCH: 31 pg (ref 26.0–34.0)
MCHC: 34.8 g/dL (ref 30.0–36.0)
MCV: 89.3 fL (ref 80.0–100.0)
Monocytes Absolute: 0.6 K/uL (ref 0.1–1.0)
Monocytes Relative: 12 %
Neutro Abs: 3.1 K/uL (ref 1.7–7.7)
Neutrophils Relative %: 65 %
Platelet Count: 123 K/uL — ABNORMAL LOW (ref 150–400)
RBC: 3.64 MIL/uL — ABNORMAL LOW (ref 4.22–5.81)
RDW: 14.6 % (ref 11.5–15.5)
WBC Count: 4.7 K/uL (ref 4.0–10.5)
nRBC: 0 % (ref 0.0–0.2)

## 2024-03-31 LAB — CMP (CANCER CENTER ONLY)
ALT: 10 U/L (ref 0–44)
AST: 15 U/L (ref 15–41)
Albumin: 3.9 g/dL (ref 3.5–5.0)
Alkaline Phosphatase: 62 U/L (ref 38–126)
Anion gap: 5 (ref 5–15)
BUN: 25 mg/dL — ABNORMAL HIGH (ref 8–23)
CO2: 27 mmol/L (ref 22–32)
Calcium: 9.5 mg/dL (ref 8.9–10.3)
Chloride: 108 mmol/L (ref 98–111)
Creatinine: 0.94 mg/dL (ref 0.61–1.24)
GFR, Estimated: >60 mL/min (ref >60)
Glucose, Bld: 161 mg/dL — ABNORMAL HIGH (ref 70–99)
Potassium: 5.1 mmol/L (ref 3.5–5.1)
Sodium: 140 mmol/L (ref 135–145)
Total Bilirubin: 0.5 mg/dL (ref 0.0–1.2)
Total Protein: 6.9 g/dL (ref 6.5–8.1)

## 2024-03-31 NOTE — Progress Notes (Signed)
 [facility] [phone]   [phone]  OFFICE PROGRESS NOTE  Chad Santos, MD [address] [place] [address]  DIAGNOSIS:  Recurrent/metastatic (T1c, N3, M1b) non-small cell lung cancer, adenocarcinoma initially diagnosed as early stage disease, stage I in March 2024 involving the right lower lobe and left upper lobe status post SBRT completed in May 2024.  The patient presented with recurrent and metastatic disease to bilateral supraclavicular, subpectoral, left axillary as well as upper abdominal lymphadenopathy and malignant left pleural effusion in December 2024 with confirmation of malignancy from the pleural effusion.   Biomarker Findings HRD signature - Cannot Be Determined Microsatellite status - Cannot Be Determined ? Tumor Mutational Burden - Cannot Be Determined Genomic Findings For a complete list of the genes assayed, please refer to the Appendix. BRAF V600E CHEK2 R474C CTNNB1 S45C DNMT3A W330* PPARG E3K 7 Disease relevant genes with no reportable alterations: ALK, EGFR, ERBB2, KRAS, MET, RET, ROS1  PDL1 TPS 1%  PRIOR THERAPY: None  CURRENT THERAPY:  Encorafenib  450 mg p.o. daily and binimetinib  45 mg p.o. twice daily.  First dose July 22, 2023.  INTERVAL HISTORY: MYLAN SCHWARZ 83 y.o. male returns to the clinic today for follow-up visit accompanied by his wife. Discussed the use of AI scribe software for clinical note transcription with the patient, who gave verbal consent to proceed.  History of Present Illness Chad Moreno is an 83 year old male with metastatic non-small cell lung cancer who presents for evaluation and repeat CT scan for restaging of his disease. He is accompanied by his wife.  Initially diagnosed with stage I non-small cell lung cancer, adenocarcinoma, in March 2024, his condition progressed to metastatic disease by December 2024. Molecular studies revealed a BRAF V600E mutation and  PD-L1 expression of 1%.  He is currently undergoing treatment with reduced dose encorafenib  and binimetinib , started in January 2025. He feels 'a little tired' but notes the fatigue is less severe than before. He has been on this treatment for almost ten months.  He feels stronger and has been able to drive to the beach, a five-hour trip, and plans to try playing golf. Occasionally, he experiences lightheadedness when standing up quickly, which resolves after about ten seconds.  His colitis seems to be under control, and he does not report any significant effects from congestive heart failure. He is taking the oral drugs at a reduced dose of 150 mg and 45 mg once a day, respectively, and reports no trouble with these medications.  He notes a significant improvement in his fatigue and strength levels since the dose reduction. His anemia has improved, with a current hemoglobin level of 11.3, up from 10.5 previously. His platelets and white blood count are stable.    MEDICAL HISTORY: Past Medical History:  Diagnosis Date   Acute hypoxemic respiratory failure (HCC) 01/23/2024   Acute systolic CHF (congestive heart failure) (HCC) 01/24/2024   Age-related cataract of left eye    Amblyopia, right eye    Arthritis    Bacteremia due to Escherichia coli 02/01/2024   Cancer (HCC)    CAP (community acquired pneumonia) 01/23/2024   Dysuria    ED (erectile dysfunction)    Elevated PSA    Epidermal cyst    Family history of prostate cancer    Gout    Hip pain    Histoplasmosis    Hyperlipidemia    Hypermetropia, left eye    Hypertension  Hypokalemia 01/25/2024   Impaired glucose tolerance    Joint pain    Left shoulder pain    Lower back pain    Melanocytic nevi, unspecified    Nummular dermatitis    Ocular hypertension    Palmar fascial fibromatosis    Prostate cancer screening    Regular astigmatism, right eye    Senile nuclear sclerosis    SIRS (systemic inflammatory response  syndrome) (HCC) 01/23/2024    ALLERGIES:  is allergic to sulfa antibiotics and ciprofloxacin.  MEDICATIONS:  Current Outpatient Medications  Medication Sig Dispense Refill   ascorbic acid  (VITAMIN C ) 500 MG tablet Take 1 tablet (500 mg total) by mouth daily.     B Complex Vitamins (VITAMIN-B COMPLEX) TABS Take 1 tablet by mouth daily with breakfast.     [Paused] binimetinib  (MEKTOVI ) 15 MG tablet Take 3 tablets (45 mg total) by mouth 2 (two) times daily. 180 tablet 3   calcium carbonate (OSCAL) 1500 (600 Ca) MG TABS tablet Take 1,200 mg by mouth 2 (two) times daily with a meal.     celecoxib  (CELEBREX ) 200 MG capsule TAKE 1 CAPSULE BY MOUTH 2 TIMES A DAY 60 capsule 3   cyanocobalamin (VITAMIN B12) 1000 MCG tablet Take 1,000 mcg by mouth daily.     [Paused] encorafenib  (BRAFTOVI ) 75 MG capsule Take 6 capsules (450 mg total) by mouth daily. 180 capsule 3   ferrous sulfate  325 (65 FE) MG EC tablet Take 1 tablet (325 mg total) by mouth daily as needed. (Patient taking differently: Take 325 mg by mouth every other day.)     furosemide  (LASIX ) 20 MG tablet Take 1 tablet (20 mg total) by mouth daily as needed. For weight gain or swelling 30 tablet 11   leptospermum manuka honey (MEDIHONEY) PSTE paste Apply 1 Application topically daily. 44 mL 1   loperamide  (IMODIUM  A-D) 2 MG tablet Take 1 tablet (2 mg total) by mouth 4 (four) times daily as needed for diarrhea or loose stools. 30 tablet 0   losartan  (COZAAR ) 25 MG tablet Take 1 tablet (25 mg total) by mouth daily. 90 tablet 2   Nystatin (GERHARDT'S BUTT CREAM) CREA Apply 1 Application topically 2 (two) times daily. 1 each 1   ondansetron  (ZOFRAN -ODT) 4 MG disintegrating tablet Take 1 tablet (4 mg total) by mouth every 8 (eight) hours as needed for nausea or vomiting. 20 tablet 0   pantoprazole  (PROTONIX ) 40 MG tablet Take 1 tablet (40 mg total) by mouth 2 (two) times daily. 60 tablet 0   POTASSIUM PO Take 1 tablet by mouth daily with breakfast.      Probiotic Product (PROBIOTIC PEARLS ADVANTAGE PO) Take 1 capsule by mouth in the morning.     protein supplement shake (PREMIER PROTEIN) LIQD Take 11 oz by mouth See admin instructions. Drink one to two cartons daily     silodosin  (RAPAFLO ) 8 MG CAPS capsule Take 8 mg by mouth daily.     Wound Cleansers (VASHE CLEANSING) SOLN Apply 1 application  topically as needed. With dressing changes 250 mL 1   No current facility-administered medications for this visit.    SURGICAL HISTORY:  Past Surgical History:  Procedure Laterality Date   BRONCHIAL BIOPSY  08/27/2022   Procedure: BRONCHIAL BIOPSIES;  Surgeon: Shelah Lamar RAMAN, MD;  Location[facility] ENDOSCOPY;  Service: Pulmonary;;   BRONCHIAL BRUSHINGS  08/27/2022   Procedure: BRONCHIAL BRUSHINGS;  Surgeon: Shelah Lamar RAMAN, MD;  Location: [facility] ENDOSCOPY;  Service: Pulmonary;;   BRONCHIAL  NEEDLE ASPIRATION BIOPSY  08/27/2022   Procedure: BRONCHIAL NEEDLE ASPIRATION BIOPSIES;  Surgeon: Shelah Lamar RAMAN, MD;  Location: [facility] ENDOSCOPY;  Service: Pulmonary;;   CONSTRICTING FINGER RING EXCISION Left    CYST REMOVAL TRUNK Left    left posterior shoulder   FIDUCIAL MARKER PLACEMENT  08/27/2022   Procedure: FIDUCIAL MARKER PLACEMENT;  Surgeon: Shelah Lamar RAMAN, MD;  Location: [facility] ENDOSCOPY;  Service: Pulmonary;;   GOLD SEED IMPLANT N/A 10/02/2022   Procedure: GOLD SEED IMPLANT;  Surgeon: Lovie Arlyss CROME, MD;  Location: [facility];  Service: Urology;  Laterality: N/A;   index finger Left    IR FACET JT INJ C/T  2ND LEVEL RIGHT W/FL/CT  01/20/2024   IR FACET JT INJ C/T  SINGLE LEVEL RIGHT W/FL/CT  01/20/2024   IR FACET JT INJ C/T 3RD PLUS LEVEL RIGHT W/FL/CT  01/20/2024   IR THORACENTESIS ASP PLEURAL SPACE W/IMG GUIDE  06/14/2023   Prostate needle biopsy     SPACE OAR INSTILLATION N/A 10/02/2022   Procedure: SPACE OAR INSTILLATION;  Surgeon: Lovie Arlyss CROME, MD;  Location: [facility];  Service: Urology;  Laterality: N/A;    REVIEW OF SYSTEMS:   Constitutional: positive for fatigue Eyes: negative Ears, nose, mouth, throat, and face: negative Respiratory: positive for dyspnea on exertion Cardiovascular: negative Gastrointestinal: negative Genitourinary:negative Integument/breast: negative Hematologic/lymphatic: negative Musculoskeletal:negative Neurological: negative Behavioral/Psych: negative Endocrine: negative Allergic/Immunologic: negative   PHYSICAL EXAMINATION: General appearance: alert, cooperative, fatigued, and no distress Head: Normocephalic, without obvious abnormality, atraumatic Neck: no adenopathy, no JVD, supple, symmetrical, trachea midline, and thyroid  not enlarged, symmetric, no tenderness/mass/nodules Lymph nodes: Cervical, supraclavicular, and axillary nodes normal. Resp: clear to auscultation bilaterally Back: symmetric, no curvature. ROM normal. No CVA tenderness. Cardio: regular rate and rhythm, S1, S2 normal, no murmur, click, rub or gallop GI: soft, non-tender; bowel sounds normal; no masses,  no organomegaly Extremities: extremities normal, atraumatic, no cyanosis or edema Neurologic: Alert and oriented X 3, normal strength and tone. Normal symmetric reflexes. Normal coordination and gait  ECOG PERFORMANCE STATUS: 1 - Symptomatic but completely ambulatory  Blood pressure 122/84, pulse 86, temperature 97.6 F (36.4 C), resp. rate 17, height 6' 1 (1.854 m), weight 196 lb 9.6 oz (89.2 kg), SpO2 99%.  LABORATORY DATA: Lab Results  Component Value Date   WBC 4.7 03/31/2024   HGB 11.3 (L) 03/31/2024   HCT 32.5 (L) 03/31/2024   MCV 89.3 03/31/2024   PLT 123 (L) 03/31/2024      Chemistry      Component Value Date/Time   NA 139 02/25/2024 0845   K 4.3 02/25/2024 0845   CL 107 02/25/2024 0845   CO2 25 02/25/2024 0845   BUN 25 (H) 02/25/2024 0845   CREATININE 0.95 02/25/2024 0845      Component Value Date/Time   CALCIUM 8.9 02/25/2024 0845   ALKPHOS 63 02/25/2024 0845   AST 14 (L)  02/25/2024 0845   ALT 9 02/25/2024 0845   BILITOT 0.4 02/25/2024 0845       RADIOGRAPHIC STUDIES: CT CHEST ABDOMEN PELVIS W CONTRAST Result Date: 03/15/2024 CLINICAL DATA:  Non-small-cell lung cancer, restaging, metastatic disease evaluation. * Tracking Code: BO * EXAM: CT CHEST, ABDOMEN, AND PELVIS WITH CONTRAST TECHNIQUE: Multidetector CT imaging of the chest, abdomen and pelvis was performed following the standard protocol during bolus administration of intravenous contrast. RADIATION DOSE REDUCTION: This exam was performed according to the departmental dose-optimization program which includes automated exposure control, adjustment of the mA  and/or kV according to patient size and/or use of iterative reconstruction technique. CONTRAST:  OMNIPAQUE  IOHEXOL  300 MG/ML  SOLN COMPARISON:  Multiple priors including CT December 11, 2023 FINDINGS: CT CHEST FINDINGS Cardiovascular: Aortic atherosclerosis. Coronary artery calcifications. Normal size heart. No significant pericardial effusion/thickening. Mediastinum/Nodes: No suspicious thyroid  nodule. No pathologically enlarged mediastinal, hilar or axillary lymph nodes. The esophagus is grossly unremarkable. Lungs/Pleura: Treated mass in the infrahilar right lower lobe with fiducial marker in place measures 3.3 x 2.0 cm on image 73/4 previously 3.3 x 1.9 cm. New irregular nodule in the paramedian right lower lobe measuring 11 mm on image 112/4. Also new cluster of nodular consolidations in the dependent right lower lobe measuring up to 17 mm on image 124/4. Within this area there is associated bronchiectasis/bronchiolectasis and bronchial wall thickening. Treated mass in the posterior left upper lobe with fiducial marker in place measures 4.6 x 2.2 cm on image 59/4 previously 4.7 x 2.3 cm. New irregular nodule in the perihilar right upper lobe anterior to the treated mass measures 11 x 7 mm on image 47/4. Decreased size of the small left pleural effusion.  Musculoskeletal: Stable sclerotic osseous lesions in the right T9 transverse process and posterior right ninth rib. Increased patchy stippled sclerosis in the lateral right sixth rib on image 91/4, this was only evident in retrospect on PET-CT June 18, 2023 as a minimal area of increased sclerosis but no increased metabolic activity. There is no evidence of expansile lesion or cortical break suggest continued attention on follow-up imaging. Thoracic spondylosis. CT ABDOMEN PELVIS FINDINGS Hepatobiliary: No suspicious hepatic lesion. Gallbladder is unremarkable. No biliary ductal dilation. Pancreas: No pancreatic ductal dilation or evidence of acute inflammation. Spleen: No splenomegaly. Adrenals/Urinary Tract: Stable left adrenal nodule previously which did not demonstrate abnormal FDG avidity on CT June 18, 2023 compatible with a benign adenoma. Right adrenal gland is within normal limits. No hydronephrosis. Fluid density 14 mm right renal lesion is compatible with a cyst. Kidneys demonstrate symmetric enhancement. Diffuse urinary bladder wall thickening with perivesicular stranding. Stomach/Bowel: Stomach is unremarkable for degree of distension. No pathologic dilation of large or small bowel. Colonic diverticulosis. Mild sigmoid colonic wall thickening with adjacent fluid. Vascular/Lymphatic: Aortic atherosclerosis. Normal caliber abdominal aorta. Smooth IVC contours. The portal, splenic and superior mesenteric veins are patent. No pathologically enlarged abdominal or pelvic lymph nodes. Reproductive: Fiducial markers in the prostate gland. Other: Trace pelvic free fluid. Musculoskeletal: Stable sclerotic osseous lesions involving the left sacrum/iliac bone at the SI joint. Stable osseous sclerotic focus in the left acetabulum. No new aggressive lytic or blastic lesion of bone identified. IMPRESSION: 1. Stable size of the treated masses in the right lower lobe and left upper lobe. 2. New irregular nodules  in the perihilar right upper lobe and paramedian right lower lobe measuring up to 11 mm, nonspecific and while possibly reflecting an infectious or inflammatory etiology metastatic disease is not excluded. 3. New cluster of nodular consolidations in the dependent right lower lobe measuring up to 17 mm with associated bronchiectasis/bronchiolectasis and bronchial wall thickening, findings are favored to reflect an infectious or inflammatory etiology. 4. Decreased size of the small left pleural effusion. 5. Stable sclerotic osseous lesions involving the right T9 transverse process, posterior right ninth rib, left sacrum/iliac bone at the SI joint and left acetabulum. No new aggressive lytic or blastic lesion of bone identified. 6. Increased focus of stippled sclerosis in the lateral right sixth rib. 7. Diffuse urinary bladder wall thickening with perivesicular  stranding, correlate with urinalysis to exclude cystitis. 8. Colonic diverticulosis with mild sigmoid colonic wall thickening and adjacent fluid, which may reflect a mild diverticulitis. 9. Aortic atherosclerosis. Aortic Atherosclerosis (ICD10-I70.0). Electronically Signed   By: Reyes Holder M.D.   On: 03/15/2024 11:58      ASSESSMENT AND PLAN: This is a very pleasant 83 years old white male with Recurrent/metastatic (T1c, N3, M1b) non-small cell lung cancer, adenocarcinoma initially diagnosed as early stage disease, stage I in March 2024 involving the right lower lobe and left upper lobe status post SBRT completed in May 2024.  The patient presented with recurrent and metastatic disease to bilateral supraclavicular, subpectoral, left axillary as well as upper abdominal lymphadenopathy and malignant left pleural effusion in December 2024 with confirmation of malignancy from the pleural effusion. Molecular studies by foundation 1 showed positive BRAF V600E mutation and PD-L1 expression of 1%. The patient is currently on treatment with encorafenib  450 mg  p.o. twice daily in addition to binimetinib  45 mg milligram p.o. twice daily.  First dose was July 22, 2023. He continues to tolerate this treatment fairly well with no concerning adverse effects.  He is currently on a 50% dose reduction of encorafenib  and binimetinib  and tolerating it fairly well. He had repeat CT scan of the chest, abdomen and pelvis performed recently.  I personally and independently reviewed the scan and discussed the result with the patient and his wife.  His scan showed no concerning findings for disease progression and there was resolution of the bilateral pleural effusion. Assessment and Plan Assessment & Plan Metastatic non-small cell lung cancer, adenocarcinoma with BRAF V600E mutation Initially diagnosed as stage I in March 2024, with metastatic disease evident by December 2024. Currently on reduced dose encorafenib  and binimetinib  since January 2025. Recent CT scan shows improvement with reduction in pleural effusion and tumor size. Mild lung inflammation noted but overall stable disease. No significant side effects from treatment. Reports improved fatigue and strength with reduced dosage. 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  Congestive heart failure Appears well-managed with no significant symptoms. Previous sepsis likely contributed to exacerbation. Echocardiogram planned to assess cardiac function. - Perform echocardiogram  Radiation-induced colitis Under control with no significant symptoms reported.  Fatigue and lightheadedness Improved with reduction in medication dosage. Occasional lightheadedness when changing positions quickly. - Advise to change positions slowly to prevent lightheadedness He was advised to call immediately if he has any other concerning symptoms in the interval.   The patient  voices understanding of current disease status and treatment options and is in agreement with the current care plan.  All questions were answered. The patient knows to call the clinic with any problems, questions or concerns. We can certainly see the patient much sooner if necessary.  The total time spent in the appointment was 30 minutes including review of chart and various tests results, discussions about plan of care and coordination of care plan .   Disclaimer: This note was dictated with voice recognition software. Similar sounding words can inadvertently be transcribed and may not be corrected upon review.

## 2024-03-31 NOTE — Progress Notes (Signed)
 Palliative Medicine [facility]  Telephone:(336) [phone] Fax:(336) [phone]   Name: Chad Moreno Date: 03/31/2024 MRN: [id number]  DOB: 1940/10/13  Patient Care Team: [name], MD as PCP - General (Internal Medicine) [place], Peter M, MD as PCP - Cardiology (Cardiology) Livingston Rigg, MD as Consulting Physician (Dermatology) Vertell Pont, RN as Oncology Nurse Navigator Bouchard, Duwaine BROCKS, RN as Oncology Nurse Navigator Pickenpack-Cousar, Fannie SAILOR, NP as Nurse Practitioner (Hospice and Palliative Medicine) [name], NP as Nurse Practitioner (Cardiology)   INTERVAL HISTORY: Chad Moreno is a 83 y.o. male with  oncologic medical history including recurrent metastatic non-small cell lung cancer initially diagnosed in March 2024 s/p SBRT.  Now with metastatic disease including left axillary, bilateral supraclavicular, abdominal lymphadenopathy, and malignant pleural effusion (05/2023) s/p palliative radiation to right chest completed April 2025.  Patient currently on Encorafenib  and binimetinib .  Palliative is seeing patient for symptom management and goals of care.   SOCIAL HISTORY:     reports that he quit smoking about 44 years ago. His smoking use included cigarettes. He started smoking about 54 years ago. He has a 5 pack-year smoking history. He has never used smokeless tobacco. He reports current alcohol  use. He reports that he does not use drugs.  ADVANCE DIRECTIVES:  Patient to bring in documents to be scanned on file. He reports his wife Chad Moreno is his Museum/gallery exhibitions officer.   CODE STATUS: DNR  PAST MEDICAL HISTORY: Past Medical History:  Diagnosis Date   Acute hypoxemic respiratory failure (HCC) 01/23/2024   Acute systolic CHF (congestive heart failure) (HCC) 01/24/2024   Age-related cataract of left eye    Amblyopia, right eye    Arthritis    Bacteremia due to Escherichia coli 02/01/2024   Cancer (HCC)    CAP (community  acquired pneumonia) 01/23/2024   Dysuria    ED (erectile dysfunction)    Elevated PSA    Epidermal cyst    Family history of prostate cancer    Gout    Hip pain    Histoplasmosis    Hyperlipidemia    Hypermetropia, left eye    Hypertension    Hypokalemia 01/25/2024   Impaired glucose tolerance    Joint pain    Left shoulder pain    Lower back pain    Melanocytic nevi, unspecified    Nummular dermatitis    Ocular hypertension    Palmar fascial fibromatosis    Prostate cancer screening    Regular astigmatism, right eye    Senile nuclear sclerosis    SIRS (systemic inflammatory response syndrome) (HCC) 01/23/2024    ALLERGIES:  is allergic to sulfa antibiotics and ciprofloxacin.  MEDICATIONS:  Current Outpatient Medications  Medication Sig Dispense Refill   ascorbic acid  (VITAMIN C ) 500 MG tablet Take 1 tablet (500 mg total) by mouth daily.     [Paused] binimetinib  (MEKTOVI ) 15 MG tablet Take 3 tablets (45 mg total) by mouth 2 (two) times daily. 180 tablet 3   calcium carbonate (OSCAL) 1500 (600 Ca) MG TABS tablet Take 1,200 mg by mouth 2 (two) times daily with a meal.     celecoxib  (CELEBREX ) 200 MG capsule TAKE 1 CAPSULE BY MOUTH 2 TIMES A DAY 60 capsule 3   [Paused] encorafenib  (BRAFTOVI ) 75 MG capsule Take 6 capsules (450 mg total) by mouth daily. 180 capsule 3   furosemide  (LASIX ) 20 MG tablet Take 1 tablet (20 mg total) by mouth daily as needed. For weight gain  or swelling 30 tablet 11   loperamide  (IMODIUM  A-D) 2 MG tablet Take 1 tablet (2 mg total) by mouth 4 (four) times daily as needed for diarrhea or loose stools. 30 tablet 0   losartan  (COZAAR ) 25 MG tablet Take 1 tablet (25 mg total) by mouth daily. 90 tablet 2   ondansetron  (ZOFRAN -ODT) 4 MG disintegrating tablet Take 1 tablet (4 mg total) by mouth every 8 (eight) hours as needed for nausea or vomiting. 20 tablet 0   pantoprazole  (PROTONIX ) 40 MG tablet Take 1 tablet (40 mg total) by mouth 2 (two) times daily. 60  tablet 0   POTASSIUM PO Take 1 tablet by mouth daily with breakfast.     Probiotic Product (PROBIOTIC PEARLS ADVANTAGE PO) Take 1 capsule by mouth in the morning.     protein supplement shake (PREMIER PROTEIN) LIQD Take 11 oz by mouth See admin instructions. Drink one to two cartons daily     silodosin  (RAPAFLO ) 8 MG CAPS capsule Take 8 mg by mouth daily.     No current facility-administered medications for this visit.    VITAL SIGNS: There were no vitals taken for this visit. There were no vitals filed for this visit.  Estimated body mass index is 25.94 kg/m as calculated from the following:   Height as of an earlier encounter on 03/31/24: 6' 1 (1.854 m).   Weight as of an earlier encounter on 03/31/24: 196 lb 9.6 oz (89.2 kg).   PERFORMANCE STATUS (ECOG) : 1 - Symptomatic but completely ambulatory  Assessment NAD RRR Normal breathing pattern AAO x3  IMPRESSION: Discussed the use of AI scribe software for clinical note transcription with the patient, who gave verbal consent to proceed.  History of Present Illness [name] is an 83 year old male with metastatic cancer who presented to the clinic for symptom management follow-up. His wife is also present. Denies concerns of nausea, vomiting, constipation, or diarrhea.   [name] sleeps well at night, although he wakes up a couple of times to use the bathroom but can return to sleep easily. He experiences mild lower back pain after walking and manages it with Tylenol .   He underwent an iron  infusion on July 23rd and paravertebral nerve block T8,9,10 on July 28th which were both successful.   He has not required any pain medication since his nerve block, except for occasional Aleve or Tylenol  for back pain. He completed in-home nursing and physical therapy, and his medication list has been reduced as he is no longer anemic. His B vitamins were discontinued due to elevated levels.  Mr. Jeanlouis reports he is  monitoring his salt intake closely, keeping it around 2000 mg per day, and maintaining a fluid intake of about 60 ounces per day. He experienced mild diarrhea but nothing severe.  He is focusing on his nutrition, ensuring adequate protein intake and consuming fruit daily. He is monitoring his weight closely, weighing himself every morning and adjusting his diuretic use based on weight changes.  Current weight today is average 192lb compared to home scale.   Overall Mr. Oleksy is doing well. He and wife are much appreciative of his improvements. He is planning to attempt playing golf for the first time in two years, indicating an improvement in his physical condition.  All questions answered and support provided.   Goals of Care 12/03/23: We discussed his current illness and what it means in the larger context of his on-going co-morbidities. Natural disease trajectory and  expectations were discussed. Mr. Beranek and his wife are realistic in their understanding of his metastatic cancer and plan of care.    I empathetically approach discussions regarding advanced directives, CODE STATUS, and healthcare limitations.  Although patient does not have an advanced directive on file he states he does have a completed document.  Encouraged patient to bring in to allow for filing.  Patient states his wife Barnie is his medical decision-maker in the event he is unable to speak for himself.  His desires for natural death with no life-sustaining measures.  DNR/DNI.  No artificial feedings long-term.   Patient and wife are clear in her expressed wishes to continue to treat the treatable allow him every opportunity to continue to thrive.  His quality of life is most important to him.  Will like his symptoms to be managed to minimize discomfort.    I discussed the importance of continued conversation with family and their medical providers regarding overall plan of care and treatment options, ensuring decisions are  within the context of the patients values and GOCs. Assessment & Plan Pain Much improved and reports pretty much resolved s/p paravertebral nerve block on 7/28. Only requiring occasional Tylenol  for minors aches.   Dysphonia due to vocal cord dysfunction Chronic dysphonia secondary to vocal cord dysfunction with initial improvement from filler injections. - Continue follow-up with ENT for vocal cord dysfunction - Undergo scheduled filler injection procedure  Weight monitoring for fluid status Ongoing weight monitoring to manage fluid status. Recent weight recorded at 196 lbs, likely closer to 192 lbs accounting for clothing. - Continue daily weight monitoring - Take Lasix  if weight increases by more than 3 lbs in one day or 5 lbs in one week  I will plan to see patient back in 6 weeks. Sooner if needed.   Patient expressed understanding and was in agreement with this plan. He also understands that He can call the clinic at any time with any questions, concerns, or complaints.   Any controlled substances utilized were prescribed in the context of palliative care. PDMP has been reviewed.   Visit consisted of counseling and education dealing with the complex and emotionally intense issues of symptom management and palliative care in the setting of serious and potentially life-threatening illness.  Levon Borer, AGPCNP-BC  Palliative Medicine Team/ Cancer Center

## 2024-04-02 DIAGNOSIS — D649 Anemia, unspecified: Secondary | ICD-10-CM | POA: Diagnosis not present

## 2024-04-02 DIAGNOSIS — Z1339 Encounter for screening examination for other mental health and behavioral disorders: Secondary | ICD-10-CM | POA: Diagnosis not present

## 2024-04-02 DIAGNOSIS — I10 Essential (primary) hypertension: Secondary | ICD-10-CM | POA: Diagnosis not present

## 2024-04-02 DIAGNOSIS — C61 Malignant neoplasm of prostate: Secondary | ICD-10-CM | POA: Diagnosis not present

## 2024-04-02 DIAGNOSIS — F4329 Adjustment disorder with other symptoms: Secondary | ICD-10-CM | POA: Diagnosis not present

## 2024-04-02 DIAGNOSIS — Z1331 Encounter for screening for depression: Secondary | ICD-10-CM | POA: Diagnosis not present

## 2024-04-02 DIAGNOSIS — D126 Benign neoplasm of colon, unspecified: Secondary | ICD-10-CM | POA: Diagnosis not present

## 2024-04-02 DIAGNOSIS — R600 Localized edema: Secondary | ICD-10-CM | POA: Diagnosis not present

## 2024-04-02 DIAGNOSIS — E669 Obesity, unspecified: Secondary | ICD-10-CM | POA: Diagnosis not present

## 2024-04-02 DIAGNOSIS — E785 Hyperlipidemia, unspecified: Secondary | ICD-10-CM | POA: Diagnosis not present

## 2024-04-02 DIAGNOSIS — Z0000 Encounter for general adult medical examination without abnormal findings: Secondary | ICD-10-CM | POA: Diagnosis not present

## 2024-04-02 DIAGNOSIS — M1990 Unspecified osteoarthritis, unspecified site: Secondary | ICD-10-CM | POA: Diagnosis not present

## 2024-04-02 DIAGNOSIS — H5440 Blindness, one eye, unspecified eye: Secondary | ICD-10-CM | POA: Diagnosis not present

## 2024-04-02 DIAGNOSIS — C3490 Malignant neoplasm of unspecified part of unspecified bronchus or lung: Secondary | ICD-10-CM | POA: Diagnosis not present

## 2024-04-02 DIAGNOSIS — Z23 Encounter for immunization: Secondary | ICD-10-CM | POA: Diagnosis not present

## 2024-04-03 DIAGNOSIS — R82998 Other abnormal findings in urine: Secondary | ICD-10-CM | POA: Diagnosis not present

## 2024-04-13 DIAGNOSIS — C61 Malignant neoplasm of prostate: Secondary | ICD-10-CM | POA: Diagnosis not present

## 2024-04-14 ENCOUNTER — Ambulatory Visit (INDEPENDENT_AMBULATORY_CARE_PROVIDER_SITE_OTHER): Admitting: Otolaryngology

## 2024-04-15 ENCOUNTER — Other Ambulatory Visit (HOSPITAL_COMMUNITY): Payer: Self-pay

## 2024-04-16 ENCOUNTER — Ambulatory Visit (INDEPENDENT_AMBULATORY_CARE_PROVIDER_SITE_OTHER)

## 2024-04-16 VITALS — BP 154/78 | HR 63 | Temp 98.0°F | Wt 196.0 lb

## 2024-04-16 DIAGNOSIS — J3801 Paralysis of vocal cords and larynx, unilateral: Secondary | ICD-10-CM | POA: Diagnosis not present

## 2024-04-16 DIAGNOSIS — J383 Other diseases of vocal cords: Secondary | ICD-10-CM

## 2024-04-16 DIAGNOSIS — C3490 Malignant neoplasm of unspecified part of unspecified bronchus or lung: Secondary | ICD-10-CM

## 2024-04-16 DIAGNOSIS — R490 Dysphonia: Secondary | ICD-10-CM | POA: Diagnosis not present

## 2024-04-17 ENCOUNTER — Other Ambulatory Visit: Payer: Self-pay

## 2024-04-17 ENCOUNTER — Other Ambulatory Visit (HOSPITAL_COMMUNITY): Payer: Self-pay

## 2024-04-17 NOTE — Progress Notes (Signed)
 HPI:   Chad Moreno is a 83 y.o. male who presents as an established patient of the practice with known left vocal cord paralysis.  His wife is present during this encounter to provide history as well.  Patient has undergone previous injection laryngoplasty in office on 09/25/2023.  Patient states after the procedure he did have improvement in his voice.  He states that since then he was admitted to the hospital for a bowel obstruction and required placement of an NG tube.  He states that his voice is now back to being breathy and hoarse.  He wishes to pursue another injection.  He denies any other new complaints.   PMH/Meds/All/SocHx/FamHx/ROS: Past Medical History:  Diagnosis Date   Acute hypoxemic respiratory failure (HCC) 01/23/2024   Acute systolic CHF (congestive heart failure) (HCC) 01/24/2024   Age-related cataract of left eye    Amblyopia, right eye    Arthritis    Bacteremia due to Escherichia coli 02/01/2024   Cancer (HCC)    CAP (community acquired pneumonia) 01/23/2024   Dysuria    ED (erectile dysfunction)    Elevated PSA    Epidermal cyst    Family history of prostate cancer    Gout    Hip pain    Histoplasmosis    Hyperlipidemia    Hypermetropia, left eye    Hypertension    Hypokalemia 01/25/2024   Impaired glucose tolerance    Joint pain    Left shoulder pain    Lower back pain    Melanocytic nevi, unspecified    Nummular dermatitis    Ocular hypertension    Palmar fascial fibromatosis    Prostate cancer screening    Regular astigmatism, right eye    Senile nuclear sclerosis    SIRS (systemic inflammatory response syndrome) (HCC) 01/23/2024   Past Surgical History:  Procedure Laterality Date   BRONCHIAL BIOPSY  08/27/2022   Procedure: BRONCHIAL BIOPSIES;  Surgeon: Shelah Lamar RAMAN, MD;  Location: [facility];  Service: Pulmonary;;   BRONCHIAL BRUSHINGS  08/27/2022   Procedure: BRONCHIAL BRUSHINGS;  Surgeon: Shelah Lamar RAMAN, MD;  Location: [facility];   Service: Pulmonary;;   BRONCHIAL NEEDLE ASPIRATION BIOPSY  08/27/2022   Procedure: BRONCHIAL NEEDLE ASPIRATION BIOPSIES;  Surgeon: Shelah Lamar RAMAN, MD;  Location: [facility];  Service: Pulmonary;;   CONSTRICTING FINGER RING EXCISION Left    CYST REMOVAL TRUNK Left    left posterior shoulder   FIDUCIAL MARKER PLACEMENT  08/27/2022   Procedure: FIDUCIAL MARKER PLACEMENT;  Surgeon: Shelah Lamar RAMAN, MD;  Location: [facility];  Service: Pulmonary;;   GOLD SEED IMPLANT N/A 10/02/2022   Procedure: GOLD SEED IMPLANT;  Surgeon: Lovie Arlyss CROME, MD;  Location: [facility];  Service: Urology;  Laterality: N/A;   index finger Left    IR FACET JT INJ C/T  2ND LEVEL RIGHT W/FL/CT  01/20/2024   IR FACET JT INJ C/T  SINGLE LEVEL RIGHT W/FL/CT  01/20/2024   IR FACET JT INJ C/T 3RD PLUS LEVEL RIGHT W/FL/CT  01/20/2024   IR RADIOLOGIST EVAL & MGMT  02/19/2024   IR THORACENTESIS ASP PLEURAL SPACE W/IMG GUIDE  06/14/2023   Prostate needle biopsy     SPACE OAR INSTILLATION N/A 10/02/2022   Procedure: SPACE OAR INSTILLATION;  Surgeon: Lovie Arlyss CROME, MD;  Location: [facility];  Service: Urology;  Laterality: N/A;   No family history of bleeding disorders, wound healing problems or difficulty with anesthesia.  Social Connections: Socially Integrated (  01/23/2024)   Social Connection and Isolation Panel    Frequency of Communication with Friends and Family: More than three times a week    Frequency of Social Gatherings with Friends and Family: Once a week    Attends Religious Services: More than 4 times per year    Active Member of [employer] or Organizations: Yes    Attends Engineer, structural: More than 4 times per year    Marital Status: Married    Current Outpatient Medications:    ascorbic acid  (VITAMIN C ) 500 MG tablet, Take 1 tablet (500 mg total) by mouth daily., Disp: , Rfl:    [Paused] binimetinib  (MEKTOVI ) 15 MG tablet, Take 3 tablets (45 mg total) by mouth 2 (two)  times daily., Disp: 180 tablet, Rfl: 3   calcium carbonate (OSCAL) 1500 (600 Ca) MG TABS tablet, Take 1,200 mg by mouth 2 (two) times daily with a meal., Disp: , Rfl:    celecoxib  (CELEBREX ) 200 MG capsule, TAKE 1 CAPSULE BY MOUTH 2 TIMES A DAY, Disp: 60 capsule, Rfl: 3   [Paused] encorafenib  (BRAFTOVI ) 75 MG capsule, Take 6 capsules (450 mg total) by mouth daily., Disp: 180 capsule, Rfl: 3   furosemide  (LASIX ) 20 MG tablet, Take 1 tablet (20 mg total) by mouth daily as needed. For weight gain or swelling, Disp: 30 tablet, Rfl: 11   loperamide  (IMODIUM  A-D) 2 MG tablet, Take 1 tablet (2 mg total) by mouth 4 (four) times daily as needed for diarrhea or loose stools., Disp: 30 tablet, Rfl: 0   losartan  (COZAAR ) 25 MG tablet, Take 1 tablet (25 mg total) by mouth daily., Disp: 90 tablet, Rfl: 2   ondansetron  (ZOFRAN -ODT) 4 MG disintegrating tablet, Take 1 tablet (4 mg total) by mouth every 8 (eight) hours as needed for nausea or vomiting., Disp: 20 tablet, Rfl: 0   pantoprazole  (PROTONIX ) 40 MG tablet, Take 1 tablet (40 mg total) by mouth 2 (two) times daily., Disp: 60 tablet, Rfl: 0   POTASSIUM PO, Take 1 tablet by mouth daily with breakfast., Disp: , Rfl:    Probiotic Product (PROBIOTIC PEARLS ADVANTAGE PO), Take 1 capsule by mouth in the morning., Disp: , Rfl:    protein supplement shake (PREMIER PROTEIN) LIQD, Take 11 oz by mouth See admin instructions. Drink one to two cartons daily, Disp: , Rfl:    silodosin  (RAPAFLO ) 8 MG CAPS capsule, Take 8 mg by mouth daily., Disp: , Rfl:  A complete ROS was performed with pertinent positives/negatives noted in the HPI. The remainder of the ROS are negative.   Physical Exam:  BP (!) 154/78 (BP Location: Left Arm, Patient Position: Sitting, Cuff Size: Normal)   Pulse 63   Temp 98 F (36.7 C) (Oral)   Wt 196 lb (88.9 kg)   SpO2 96%   BMI 25.86 kg/m  General: Well developed, well nourished. No acute distress. Voice hoarse, breathy Head/Face:  Normocephalic. No sinus tenderness. Facial nerve intact and equal bilaterally. No facial lacerations. Eyes: PERRL, no scleral icterus or conjunctival hemorrhage. EOMI. Ears: No gross deformity. Normal external canal. Tympanic membrane intact bilaterally Hearing: Normal speech reception.  Nose: No gross deformity or lesions. No purulent discharge. No turbinate hypertrophy. Mouth/Oropharynx: Lips without any lesions. Dentition good. No mucosal lesions within the oropharynx. No tonsillar enlargement, exudate, or lesions. Pharyngeal walls symmetrical. Uvula midline. Tongue midline without lesions. Larynx: See TFL if applicable Nasopharynx: See TFL if applicable Neck: Trachea midline. No masses. No thyromegaly or nodules palpated. No crepitus.  Lymphatic: No lymphadenopathy in the neck. Respiratory: No stridor or distress. Room air. Cardiovascular: Regular rate and rhythm. Extremities: No edema or cyanosis. Warm and well-perfused. Skin: No scars or lesions on face or neck. Neurologic: CN II-XII grossly intact. Moving all extremities without gross abnormality. Other:  Independent Review of Additional Tests or Records: None Procedures: Flexible Fiberoptic Laryngoscopy Procedure Note  Date of procedure 04/17/2024 Pre-Op Diagnosis: vocal cord paralysis    Post Op Diagnosis: same Procedure: Flexible Fiberoptic Laryngoscopy CPT 68424 - Mod 25 Surgeon: Adah Malkin, D.O. Anesthesia: 4% lidocaine  with afrin  Findings:  - left vocal cord paralysis - b/l vocal cord atrophy Procedure Detail: After verbal consent was obtained from the patient, the patient was brought in an upright position, a fiberoptic nasal laryngoscope was then passed into the patient right nasal passage and left nasal passage, the right appeared to be more patent. It was passed along the floor of the nasal cavity to the nasopharynx. Torus tubarius was patent and the Fossa of Rosenmller was identified. The scope was then flexed  caudally and advanced slowly through the nasopharynx, passed through the oropharynx, and down into the hypopharynx. The patient's oro- and nasopharynx were unremarkable with no signs of any gross lesions, edema, masses, or bleeding.  The base of tongue was visualized and no mass, ulceration or lesion was appreciated. The epiglottis did not demonstrate any mass, ulceration or lesion. The vallecula was also assessed with no mass, ulceration or lesion. The patient had good glottic closure upon phonation and no signs of aspiration or pooling of secretions. The patient was asked to inspire and expire with the right true vocal fold vibrating normally and without evidence of vocal fold dysfunction. The left true vocal fold was noted to be immobile. Bilateral true vocal folds were noted to be atrophic. The true and false vocal cords, interarytenoid, AE folds, and arytenoids did not demonstrate any significant edema or erythema. The patient was then asked to valsalva, and the pyriform sinuses were assessed which were unremarkable. The airway was patent and there was no evidence of compromise. The scope was then slowly withdrawn from the patient. The patient tolerated the procedure well and there were no complications.  Disposition: Stable  Impression & Plans: EVELYN MOCH is a 83 y.o. 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 Patient presents today to discuss repeat injection.   Vocal cord paralysis, Dysphonia, VF atrophy and glottic insufficiency Significant improvement in voice and breathing after Restylane injection augmentation in April 2025.  - Flexible laryngoscopy in office today confirming left vocal cord paralysis and glottic insufficiency - Schedule follow-up for repeat injection. - Discussed potential for permanent implant if he desires a long-term solution, considering overall health and cancer treatment status.   Metastatic  lung cancer Metastatic lung cancer with rib metastasis. Completed radiation, reducing pain.   - Continue to f/u with Oncology as scheduled     Adah Malkin, DO [place] [facility]

## 2024-04-17 NOTE — Progress Notes (Signed)
 Specialty Pharmacy Refill Coordination Note  Spoke with Berman Grainger is a 83 y.o. male contacted today regarding refills of specialty medication(s) Binimetinib  (MEKTOVI ); Encorafenib  (BRAFTOVI )  Doses on hand: 7  Patient requested: Delivery   Delivery date: 04/21/24   Verified address: [address] [place] [address]  Medication will be filled on 04/20/24.

## 2024-04-20 ENCOUNTER — Other Ambulatory Visit: Payer: Self-pay

## 2024-04-20 ENCOUNTER — Telehealth (INDEPENDENT_AMBULATORY_CARE_PROVIDER_SITE_OTHER): Payer: Self-pay

## 2024-04-20 NOTE — Telephone Encounter (Signed)
 Pt would like to wait to be r/s until after upcoming appt 05/11/24

## 2024-04-23 ENCOUNTER — Other Ambulatory Visit: Payer: Self-pay

## 2024-04-24 ENCOUNTER — Other Ambulatory Visit: Payer: Self-pay

## 2024-04-24 NOTE — Progress Notes (Signed)
 Specialty Pharmacy Ongoing Clinical Assessment Note  Chad Moreno is a 83 y.o. male who is being followed by the specialty pharmacy service for RxSp Oncology   Patient's specialty medication(s) reviewed today: Binimetinib  (MEKTOVI ); Encorafenib  (BRAFTOVI )   Missed doses in the last 4 weeks: 0   Patient/Caregiver did not have any additional questions or concerns.   Therapeutic benefit summary: Patient is achieving benefit   Adverse events/side effects summary: No adverse events/side effects   Patient's therapy is appropriate to: Continue    Goals Addressed             This Visit's Progress    Slow Disease Progression       Patient is on track. Patient will maintain adherence. Per OV on 10/7, recent CT showed no concerning findings for disease progression and also showed resolution of the bilateral pleural effusion          Follow up: 3 months  [facility] Specialty Pharmacist

## 2024-04-27 ENCOUNTER — Ambulatory Visit: Payer: Self-pay | Admitting: Emergency Medicine

## 2024-04-27 ENCOUNTER — Ambulatory Visit (HOSPITAL_COMMUNITY)
Admission: RE | Admit: 2024-04-27 | Discharge: 2024-04-27 | Disposition: A | Discharge status: Home / Self Care | Source: Ambulatory Visit | Attending: Cardiology | Admitting: Cardiology

## 2024-04-27 DIAGNOSIS — I5020 Unspecified systolic (congestive) heart failure: Secondary | ICD-10-CM | POA: Insufficient documentation

## 2024-04-27 LAB — ECHOCARDIOGRAM COMPLETE
Area-P 1/2: 3.65 cm2
P 1/2 time: 466 ms
S' Lateral: 3.6 cm

## 2024-04-27 NOTE — Progress Notes (Deleted)
 Cardiology Office Note:  .   Date:  04/27/2024  ID:  Chad Moreno, DOB 05/17/1941, MRN [id number] PCP: [name], MD  [facility] Providers Cardiologist:  [name], MD Cardiology APP:  [name], NP { Click to update primary MD,subspecialty MD or APP then REFRESH:1}   History of Present Illness: .   Chad Moreno is a 83 y.o. male history of metastatic nSCLC currently on immunotherapy, chronic thrombocytopenia, histoplasmosis, hypertension, hyperlipidemia, systolic heart failure   Echocardiogram 06/2023 showed LVEF 55% with no RWMA. s/p SRB [place] completed 10/2022; onencorafenib and binimetinib  started on 06/2023.   Admitted 12/30/2023 for SBO/GI bleed.  Conservative management was recommended per general surgery.  GI bleed felt to be due to cecal colitis/diverticulosis.  Received PRBCs and IV PPI.  Plan for outpatient GI EGD/colonoscopy.  Course complicated by ESBL UTI treated with antibiotics.  He was discharged home in stable condition   He was readmitted on 01/17/2024 with dyspnea initially and required BiPAP therapy.  Echocardiogram showed LVEF of 30%, RWMA's, mild LVH, RV mildly reduced.  Questionable Takotsubo cardiomyopathy versus LAD infarct versus cardiomyopathy from immunotherapy.  At immunotherapy was held.  Cardiology was consulted.  His high sensitive troponins were mildly elevated but flat and EKG was without acute ST changes.  Blood cultures were positive for a ESBL E. coli and ID was consulted.  He was started on antibiotics and diuresed with IV Lasix  and GDMT was titrated.  Patient wishes not to have invasive procedures.  GDMT was limited by low BP and orthostasis.  He was discharged home with a weight of 188 LBS.   He followed up with oncology on 01/28/2024 and encorafenib  and binimetinib  restarted at reduced doses.    He was last seen for follow-up with heart failure clinic on 02/05/2024.  No shortness of breath with activity or ADLs.  Weight at home  was stable, has not needed Lasix .  Has home health PT and home health RN.  He is without any chest pain and not interested in invasive workup.  Cardiac MRI and ischemic workup were deferred.  He was restarted on losartan  25 mg daily (did experience dizziness on Entresto ).  SGLT2i was avoided given recent UTIs, bacteremia, and sacral wound.  He is likely not ICD candidate with age and recent bacteremia should EF not improved.      ROS: ***  Studies Reviewed: SABRA         Prior CV Studies: {Select studies to display:26339}  ***  Risk Assessment/Calculations:   {Does this patient have ATRIAL FIBRILLATION?:[id number]} No BP recorded.  {Refresh Note OR Click here to enter BP  :1}***       Physical Exam:   VS:  There were no vitals taken for this visit.   Orhtostatics: No data found. Wt Readings from Last 3 Encounters:  04/16/24 196 lb (88.9 kg)  03/31/24 196 lb 9.6 oz (89.2 kg)  02/26/24 188 lb (85.3 kg)    GEN: Well nourished, well developed in no acute distress NECK: No JVD; No carotid bruits CARDIAC: ***RRR, no murmurs, rubs, gallops RESPIRATORY:  Clear to auscultation without rales, wheezing or rhonchi  ABDOMEN: Soft, non-tender, non-distended EXTREMITIES:  No edema; No deformity   ASSESSMENT AND PLAN: .    Chronic systolic heart failure Diagnosed 12/2023 Echocardiogram 06/2023 with LVEF 50 to 55% Started targeted immunotherapy with encorafenib  and binimetinib  06/2023 Echocardiogram 12/2023 with LVEF 30%, RWMA's - He is not interested in invasive workup.  Deferred cMRI and  catheterization. Agreed to medication management - Possible stress-induced cardiomyopathy (d/x with ESBL E. coli bacteremia at time of admission) versus immunotherapy cardiomyopathy versus ischemia - Notes he has had vast improvement in symptoms since his immunotherapy drugs were decreased.  Notes only symptom he experienced was shortness of breath shortly after starting immunotherapy which has resolved - NYHA  class I-II and not requiring daily loop diuretic therapy - Today patient appears euvolemic and well compensated on exam.  No dyspnea, orthopnea, PND, leg swelling.  Weight remains stable - Remains active with daily exercises including walking, band exercises, and leg lifts without exertional symptoms or limitation - Given he has had much improvement we will plan to repeat echocardiogram on 11/1 to reassess his LV function - I will hold off on further GDMT titration today given history of orthostatic hypotension - During recent admission spironolactone  was discontinued in the setting of orthostatic hypotension and Entresto  in the setting of dizziness - Avoid SGLT2i with recent UTIs, bacteremia and sacral wound  - Continue Lasix  20 mg daily as needed and losartan  25 mg daily - Not an advanced therapies candidate   Metastatic lung adenocarcinoma Initially diagnosed 08/2023 Currently on reduced dose of encorafenib  and binimetinib   - Management per oncology  Echocardiogram 01/24/2024 1. Left ventricular ejection fraction, by estimation, is 30 to 35%. The  left ventricle has moderately decreased function. The left ventricle  demonstrates regional wall motion abnormalities (see scoring  diagram/findings for description). There is mild  left ventricular hypertrophy. Left ventricular diastolic parameters are  indeterminate.   2. Right ventricular systolic function is mildly reduced. The right  ventricular size is normal. Normal RV basal function with apical  hypokinesis   3. Moderate pleural effusion in the left lateral region.   4. The mitral valve was not well visualized. Trivial mitral valve  regurgitation.   5. The aortic valve was not well visualized. Aortic valve regurgitation  is not visualized. No aortic stenosis is present.   6. The inferior vena cava is normal in size with greater than 50%  respiratory variability, suggesting right atrial pressure of 3 mmHg.    Limited echocardiogram  07/19/2023 1. Left ventricular ejection fraction, by estimation, is 55%. The left  ventricle has normal function. The left ventricle has no regional wall  motion abnormalities. Left ventricular diastolic parameters are consistent  with Grade I diastolic dysfunction  (impaired relaxation).   2. Right ventricular systolic function is normal. The right ventricular  size is normal. Tricuspid regurgitation signal is inadequate for assessing  PA pressure.   3. The aortic valve is tricuspid. There is mild calcification of the  aortic valve. Aortic valve regurgitation is trivial. No aortic stenosis is  present.   4. The inferior vena cava is normal in size with greater than 50%  respiratory variability, suggesting right atrial pressure of 3 mmHg.   5. IVC not visualized.   6. Limited echo.      {Are you ordering a CV Procedure (e.g. stress test, cath, DCCV, TEE, etc)?   Press F2        :[id number]}  Dispo: ***  Signed, Olivia Pavy, PA-C

## 2024-05-04 ENCOUNTER — Ambulatory Visit: Admitting: Emergency Medicine

## 2024-05-04 ENCOUNTER — Encounter: Payer: Self-pay | Admitting: Physician Assistant

## 2024-05-05 ENCOUNTER — Ambulatory Visit: Admitting: Physician Assistant

## 2024-05-11 ENCOUNTER — Ambulatory Visit (INDEPENDENT_AMBULATORY_CARE_PROVIDER_SITE_OTHER)

## 2024-05-11 VITALS — BP 175/79 | HR 69 | Temp 98.0°F | Wt 190.9 lb

## 2024-05-11 DIAGNOSIS — J387 Other diseases of larynx: Secondary | ICD-10-CM

## 2024-05-11 DIAGNOSIS — J3801 Paralysis of vocal cords and larynx, unilateral: Secondary | ICD-10-CM | POA: Diagnosis not present

## 2024-05-12 NOTE — Progress Notes (Addendum)
 HPI:   Chad Moreno is a 83 y.o. male who presents as an established patient of the practice with known left vocal cord paralysis.  His wife is present during this encounter to provide history as well.  Patient has undergone previous injection laryngoplasty in office on 09/25/2023.  Patient states after the procedure he did have improvement in his voice.  He states that since then he was admitted to the hospital for a bowel obstruction and required placement of an NG tube.  He states that his voice is now back to being breathy and hoarse.  He presented to clinic today for repeat injection however given complexity in anatomy and patient tolerance the injection was unable to be completed.    PMH/Meds/All/SocHx/FamHx/ROS: Past Medical History:  Diagnosis Date   Acute hypoxemic respiratory failure (HCC) 01/23/2024   Acute systolic CHF (congestive heart failure) (HCC) 01/24/2024   Age-related cataract of left eye    Amblyopia, right eye    Arthritis    Bacteremia due to Escherichia coli 02/01/2024   Cancer (HCC)    CAP (community acquired pneumonia) 01/23/2024   Dysuria    ED (erectile dysfunction)    Elevated PSA    Epidermal cyst    Family history of prostate cancer    Gout    Hip pain    Histoplasmosis    Hyperlipidemia    Hypermetropia, left eye    Hypertension    Hypokalemia 01/25/2024   Impaired glucose tolerance    Joint pain    Left shoulder pain    Lower back pain    Melanocytic nevi, unspecified    Nummular dermatitis    Ocular hypertension    Palmar fascial fibromatosis    Prostate cancer screening    Regular astigmatism, right eye    Senile nuclear sclerosis    SIRS (systemic inflammatory response syndrome) (HCC) 01/23/2024   Past Surgical History:  Procedure Laterality Date   BRONCHIAL BIOPSY  08/27/2022   Procedure: BRONCHIAL BIOPSIES;  Surgeon: Shelah Lamar RAMAN, MD;  Location: [facility];  Service: Pulmonary;;   BRONCHIAL BRUSHINGS  08/27/2022   Procedure:  BRONCHIAL BRUSHINGS;  Surgeon: Shelah Lamar RAMAN, MD;  Location: [facility];  Service: Pulmonary;;   BRONCHIAL NEEDLE ASPIRATION BIOPSY  08/27/2022   Procedure: BRONCHIAL NEEDLE ASPIRATION BIOPSIES;  Surgeon: Shelah Lamar RAMAN, MD;  Location: [facility];  Service: Pulmonary;;   CONSTRICTING FINGER RING EXCISION Left    CYST REMOVAL TRUNK Left    left posterior shoulder   FIDUCIAL MARKER PLACEMENT  08/27/2022   Procedure: FIDUCIAL MARKER PLACEMENT;  Surgeon: Shelah Lamar RAMAN, MD;  Location: [facility];  Service: Pulmonary;;   GOLD SEED IMPLANT N/A 10/02/2022   Procedure: GOLD SEED IMPLANT;  Surgeon: Lovie Arlyss CROME, MD;  Location: [facility];  Service: Urology;  Laterality: N/A;   index finger Left    IR FACET JT INJ C/T  2ND LEVEL RIGHT W/FL/CT  01/20/2024   IR FACET JT INJ C/T  SINGLE LEVEL RIGHT W/FL/CT  01/20/2024   IR FACET JT INJ C/T 3RD PLUS LEVEL RIGHT W/FL/CT  01/20/2024   IR RADIOLOGIST EVAL & MGMT  02/19/2024   IR THORACENTESIS ASP PLEURAL SPACE W/IMG GUIDE  06/14/2023   Prostate needle biopsy     SPACE OAR INSTILLATION N/A 10/02/2022   Procedure: SPACE OAR INSTILLATION;  Surgeon: Lovie Arlyss CROME, MD;  Location: [facility];  Service: Urology;  Laterality: N/A;   No family history of bleeding disorders, wound  healing problems or difficulty with anesthesia.  Social Connections: Socially Integrated (01/23/2024)   Social Connection and Isolation Panel    Frequency of Communication with Friends and Family: More than three times a week    Frequency of Social Gatherings with Friends and Family: Once a week    Attends Religious Services: More than 4 times per year    Active Member of [employer] or Organizations: Yes    Attends Engineer, Structural: More than 4 times per year    Marital Status: Married    Current Outpatient Medications:    ascorbic acid  (VITAMIN C ) 500 MG tablet, Take 1 tablet (500 mg total) by mouth daily., Disp: , Rfl:    binimetinib   (MEKTOVI ) 15 MG tablet, Take 3 tablets (45 mg total) by mouth 2 (two) times daily., Disp: 180 tablet, Rfl: 3   calcium carbonate (OSCAL) 1500 (600 Ca) MG TABS tablet, Take 1,200 mg by mouth 2 (two) times daily with a meal., Disp: , Rfl:    celecoxib  (CELEBREX ) 200 MG capsule, TAKE 1 CAPSULE BY MOUTH 2 TIMES A DAY, Disp: 60 capsule, Rfl: 3   encorafenib  (BRAFTOVI ) 75 MG capsule, Take 6 capsules (450 mg total) by mouth daily., Disp: 180 capsule, Rfl: 3   furosemide  (LASIX ) 20 MG tablet, Take 1 tablet (20 mg total) by mouth daily as needed. For weight gain or swelling, Disp: 30 tablet, Rfl: 11   loperamide  (IMODIUM  A-D) 2 MG tablet, Take 1 tablet (2 mg total) by mouth 4 (four) times daily as needed for diarrhea or loose stools., Disp: 30 tablet, Rfl: 0   losartan  (COZAAR ) 25 MG tablet, Take 1 tablet (25 mg total) by mouth daily., Disp: 90 tablet, Rfl: 2   ondansetron  (ZOFRAN -ODT) 4 MG disintegrating tablet, Take 1 tablet (4 mg total) by mouth every 8 (eight) hours as needed for nausea or vomiting., Disp: 20 tablet, Rfl: 0   pantoprazole  (PROTONIX ) 40 MG tablet, Take 1 tablet (40 mg total) by mouth 2 (two) times daily., Disp: 60 tablet, Rfl: 0   POTASSIUM PO, Take 1 tablet by mouth daily with breakfast., Disp: , Rfl:    Probiotic Product (PROBIOTIC PEARLS ADVANTAGE PO), Take 1 capsule by mouth in the morning., Disp: , Rfl:    protein supplement shake (PREMIER PROTEIN) LIQD, Take 11 oz by mouth See admin instructions. Drink one to two cartons daily, Disp: , Rfl:    silodosin  (RAPAFLO ) 8 MG CAPS capsule, Take 8 mg by mouth daily., Disp: , Rfl:  A complete ROS was performed with pertinent positives/negatives noted in the HPI. The remainder of the ROS are negative.   Physical Exam:  BP (!) 175/79 (BP Location: Right Arm, Patient Position: Sitting, Cuff Size: Normal) Comment: Pt has white coat and is nervious about procedure  Pulse 69   Temp 98 F (36.7 C)   Wt 190 lb 14.4 oz (86.6 kg)   SpO2 99%   BMI  25.19 kg/m  General: Well developed, well nourished. No acute distress. Voice hoarse, breathy Head/Face: Normocephalic. No sinus tenderness. Facial nerve intact and equal bilaterally. No facial lacerations. Eyes: PERRL, no scleral icterus or conjunctival hemorrhage. EOMI. Ears: No gross deformity. Normal external canal. Tympanic membrane intact bilaterally Hearing: Normal speech reception.  Nose: No gross deformity or lesions. No purulent discharge. No turbinate hypertrophy. Mouth/Oropharynx: Lips without any lesions. Dentition good. No mucosal lesions within the oropharynx. No tonsillar enlargement, exudate, or lesions. Pharyngeal walls symmetrical. Uvula midline. Tongue midline without lesions. Larynx: See TFL  if applicable Nasopharynx: See TFL if applicable Neck: Trachea midline. No masses. No thyromegaly or nodules palpated. No crepitus. Lymphatic: No lymphadenopathy in the neck. Respiratory: No stridor or distress. Room air. Cardiovascular: Regular rate and rhythm. Extremities: No edema or cyanosis. Warm and well-perfused. Skin: No scars or lesions on face or neck. Neurologic: CN II-XII grossly intact. Moving all extremities without gross abnormality. Other:  Independent Review of Additional Tests or Records: None Procedures: Flexible Fiberoptic Laryngoscopy Procedure Note  Date of procedure 05/11/2024 Pre-Op Diagnosis: vocal cord paralysis    Post Op Diagnosis: same Procedure: Flexible Fiberoptic Laryngoscopy CPT 31575 - Mod 25 Surgeon: Adah Malkin, D.O. Anesthesia: 4% lidocaine  with afrin  Findings:  - left vocal cord paralysis - b/l vocal cord atrophy Procedure Detail: After verbal consent was obtained from the patient, the patient was brought in an upright position, a fiberoptic nasal laryngoscope was then passed into the patient right nasal passage and left nasal passage, the right appeared to be more patent. It was passed along the floor of the nasal cavity to the  nasopharynx. Torus tubarius was patent and the Fossa of Rosenmller was identified. The scope was then flexed caudally and advanced slowly through the nasopharynx, passed through the oropharynx, and down into the hypopharynx. The patient's oro- and nasopharynx were unremarkable with no signs of any gross lesions, edema, masses, or bleeding.  The base of tongue was visualized and no mass, ulceration or lesion was appreciated. The epiglottis did not demonstrate any mass, ulceration or lesion. The vallecula was also assessed with no mass, ulceration or lesion. The patient had good glottic closure upon phonation and no signs of aspiration or pooling of secretions. The patient was asked to inspire and expire with the right true vocal fold vibrating normally and without evidence of vocal fold dysfunction. The left true vocal fold was noted to be immobile. Bilateral true vocal folds were noted to be atrophic. The true and false vocal cords, interarytenoid, AE folds, and arytenoids did not demonstrate any significant edema or erythema. The patient was then asked to valsalva, and the pyriform sinuses were assessed which were unremarkable. The airway was patent and there was no evidence of compromise. The scope was then slowly withdrawn from the patient. The patient tolerated the procedure well and there were no complications.  Disposition: Stable  Impression & Plans: CAMBREN HELM is a 83 y.o. 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 Patient presented today for repeat injection however due to the complexity in anatomy and patient tolerance the injection was unable to be completed.    Vocal cord paralysis, Dysphonia, VF atrophy and glottic insufficiency Significant improvement in voice and breathing after Restylane injection augmentation in April 2025.  - Flexible laryngoscopy in office today confirming left vocal cord paralysis and glottic  insufficiency - Will schedule injection in OR    Adah Malkin, DO [facility]

## 2024-05-14 ENCOUNTER — Other Ambulatory Visit: Payer: Self-pay

## 2024-05-14 ENCOUNTER — Inpatient Hospital Stay: Attending: Internal Medicine

## 2024-05-14 ENCOUNTER — Inpatient Hospital Stay: Admitting: Nurse Practitioner

## 2024-05-14 ENCOUNTER — Telehealth: Payer: Self-pay | Admitting: Internal Medicine

## 2024-05-14 ENCOUNTER — Encounter: Payer: Self-pay | Admitting: Nurse Practitioner

## 2024-05-14 ENCOUNTER — Inpatient Hospital Stay (HOSPITAL_BASED_OUTPATIENT_CLINIC_OR_DEPARTMENT_OTHER): Admitting: Internal Medicine

## 2024-05-14 ENCOUNTER — Other Ambulatory Visit (HOSPITAL_COMMUNITY): Payer: Self-pay

## 2024-05-14 VITALS — BP 152/72 | HR 72 | Temp 97.6°F | Resp 17 | Ht 73.0 in | Wt 190.0 lb

## 2024-05-14 DIAGNOSIS — C3492 Malignant neoplasm of unspecified part of left bronchus or lung: Secondary | ICD-10-CM | POA: Diagnosis not present

## 2024-05-14 DIAGNOSIS — C3490 Malignant neoplasm of unspecified part of unspecified bronchus or lung: Secondary | ICD-10-CM | POA: Diagnosis not present

## 2024-05-14 DIAGNOSIS — Z87891 Personal history of nicotine dependence: Secondary | ICD-10-CM | POA: Diagnosis not present

## 2024-05-14 DIAGNOSIS — Z515 Encounter for palliative care: Secondary | ICD-10-CM | POA: Diagnosis not present

## 2024-05-14 DIAGNOSIS — C7951 Secondary malignant neoplasm of bone: Secondary | ICD-10-CM

## 2024-05-14 DIAGNOSIS — C3431 Malignant neoplasm of lower lobe, right bronchus or lung: Secondary | ICD-10-CM | POA: Insufficient documentation

## 2024-05-14 DIAGNOSIS — C778 Secondary and unspecified malignant neoplasm of lymph nodes of multiple regions: Secondary | ICD-10-CM | POA: Insufficient documentation

## 2024-05-14 DIAGNOSIS — R530 Neoplastic (malignant) related fatigue: Secondary | ICD-10-CM | POA: Diagnosis not present

## 2024-05-14 LAB — CMP (CANCER CENTER ONLY)
ALT: 11 U/L (ref 0–44)
AST: 18 U/L (ref 15–41)
Albumin: 4.1 g/dL (ref 3.5–5.0)
Alkaline Phosphatase: 68 U/L (ref 38–126)
Anion gap: 10 (ref 5–15)
BUN: 27 mg/dL — ABNORMAL HIGH (ref 8–23)
CO2: 25 mmol/L (ref 22–32)
Calcium: 9.3 mg/dL (ref 8.9–10.3)
Chloride: 104 mmol/L (ref 98–111)
Creatinine: 1.11 mg/dL (ref 0.61–1.24)
GFR, Estimated: >60 mL/min (ref >60)
Glucose, Bld: 113 mg/dL — ABNORMAL HIGH (ref 70–99)
Potassium: 5 mmol/L (ref 3.5–5.1)
Sodium: 139 mmol/L (ref 135–145)
Total Bilirubin: 0.5 mg/dL (ref 0.0–1.2)
Total Protein: 7 g/dL (ref 6.5–8.1)

## 2024-05-14 LAB — CBC WITH DIFFERENTIAL (CANCER CENTER ONLY)
Abs Immature Granulocytes: 0.02 K/uL (ref 0.00–0.07)
Basophils Absolute: 0 K/uL (ref 0.0–0.1)
Basophils Relative: 1 %
Eosinophils Absolute: 0.1 K/uL (ref 0.0–0.5)
Eosinophils Relative: 1 %
HCT: 32.7 % — ABNORMAL LOW (ref 39.0–52.0)
Hemoglobin: 11.3 g/dL — ABNORMAL LOW (ref 13.0–17.0)
Immature Granulocytes: 0 %
Lymphocytes Relative: 17 %
Lymphs Abs: 0.9 K/uL (ref 0.7–4.0)
MCH: 30.5 pg (ref 26.0–34.0)
MCHC: 34.6 g/dL (ref 30.0–36.0)
MCV: 88.1 fL (ref 80.0–100.0)
Monocytes Absolute: 0.7 K/uL (ref 0.1–1.0)
Monocytes Relative: 12 %
Neutro Abs: 3.8 K/uL (ref 1.7–7.7)
Neutrophils Relative %: 69 %
Platelet Count: 95 K/uL — ABNORMAL LOW (ref 150–400)
RBC: 3.71 MIL/uL — ABNORMAL LOW (ref 4.22–5.81)
RDW: 13.8 % (ref 11.5–15.5)
WBC Count: 5.6 K/uL (ref 4.0–10.5)
nRBC: 0 % (ref 0.0–0.2)

## 2024-05-14 MED ORDER — ENCORAFENIB 75 MG PO CAPS: 150.0000 mg | ORAL_CAPSULE | Freq: Every day | ORAL | Status: DC

## 2024-05-14 MED ORDER — BINIMETINIB 15 MG PO TABS: 45.0000 mg | ORAL_TABLET | Freq: Every day | ORAL | Status: DC

## 2024-05-14 NOTE — Telephone Encounter (Signed)
 Scheduled patient for next appointment. Called and left a voicemail with the details.

## 2024-05-14 NOTE — Progress Notes (Signed)
 [facility] Telephone:([phone]) [phone]   [phone]  OFFICE PROGRESS NOTE  [name], MD [address] [place] [address]  DIAGNOSIS:  Recurrent/metastatic (T1c, N3, M1b) non-small cell lung cancer, adenocarcinoma initially diagnosed as early stage disease, stage I in March 2024 involving the right lower lobe and left upper lobe status post SBRT completed in May 2024.  The patient presented with recurrent and metastatic disease to bilateral supraclavicular, subpectoral, left axillary as well as upper abdominal lymphadenopathy and malignant left pleural effusion in December 2024 with confirmation of malignancy from the pleural effusion.   Biomarker Findings HRD signature - Cannot Be Determined Microsatellite status - Cannot Be Determined ? Tumor Mutational Burden - Cannot Be Determined Genomic Findings For a complete list of the genes assayed, please refer to the Appendix. BRAF V600E CHEK2 R474C CTNNB1 S45C DNMT3A W330* PPARG E3K 7 Disease relevant genes with no reportable alterations: ALK, EGFR, ERBB2, KRAS, MET, RET, ROS1  PDL1 TPS 1%  PRIOR THERAPY: None  CURRENT THERAPY:  Encorafenib  450 mg p.o. daily and binimetinib  45 mg p.o. twice daily.  First dose July 22, 2023.  INTERVAL HISTORY: Chad Moreno 83 y.o. male returns to the clinic today for follow-up visit accompanied by his wife. Discussed the use of AI scribe software for clinical note transcription with the patient, who gave verbal consent to proceed.  History of Present Illness Chad Moreno is an 83 year old male with metastatic nonsmall cell lung cancer who presents for evaluation and repeat blood work. He is accompanied by his wife.  He was diagnosed with metastatic nonsmall cell lung cancer, adenocarcinoma, in December 2024, with a positive BRAF B600E mutation and PD-L1 expression of one percent. He is currently on treatment with oncorafenib, 450 mg daily, and  pemazyre, 45 mg daily, which he started in January 2025. He feels 'pretty good' overall.  In the last two to three weeks, he has experienced an increase in a dry cough, which occurs suddenly and without expectoration. This cough was significant enough to interfere with a recent throat procedure, necessitating a rescheduled appointment at [facility] where he will be sedated for the procedure. He reports stable weight and good appetite.  He is handling the chemotherapy well after a dose reduction, taking the medication every morning around 8 AM, consisting of two capsules and three small brown pills. He reports no adverse effects from the current regimen.  He has a history of congestive heart failure, diagnosed in July 2025 during a hospital admission. A recent echocardiogram showed both the right and left sides pumping at 65 percent, with clear chambers and clear carotid arteries, according to the patient's recollection of the report.    MEDICAL HISTORY: Past Medical History:  Diagnosis Date   Acute hypoxemic respiratory failure (HCC) 01/23/2024   Acute systolic CHF (congestive heart failure) (HCC) 01/24/2024   Age-related cataract of left eye    Amblyopia, right eye    Arthritis    Bacteremia due to Escherichia coli 02/01/2024   Cancer (HCC)    CAP (community acquired pneumonia) 01/23/2024   Dysuria    ED (erectile dysfunction)    Elevated PSA    Epidermal cyst    Family history of prostate cancer    Gout    Hip pain    Histoplasmosis    Hyperlipidemia    Hypermetropia, left eye    Hypertension    Hypokalemia 01/25/2024   Impaired glucose tolerance  Joint pain    Left shoulder pain    Lower back pain    Melanocytic nevi, unspecified    Nummular dermatitis    Ocular hypertension    Palmar fascial fibromatosis    Prostate cancer screening    Regular astigmatism, right eye    Senile nuclear sclerosis    SIRS (systemic inflammatory response syndrome) (HCC) 01/23/2024     ALLERGIES:  is allergic to sulfa antibiotics, ciprofloxacin, and ciprofloxacin hcl.  MEDICATIONS:  Current Outpatient Medications  Medication Sig Dispense Refill   ascorbic acid  (VITAMIN C ) 500 MG tablet Take 1 tablet (500 mg total) by mouth daily.     binimetinib  (MEKTOVI ) 15 MG tablet Take 3 tablets (45 mg total) by mouth 2 (two) times daily. (Patient taking differently: Take 45 mg by mouth daily at 12 noon.) 180 tablet 3   calcium carbonate (OSCAL) 1500 (600 Ca) MG TABS tablet Take 1,200 mg by mouth 2 (two) times daily with a meal.     celecoxib  (CELEBREX ) 200 MG capsule TAKE 1 CAPSULE BY MOUTH 2 TIMES A DAY 60 capsule 3   encorafenib  (BRAFTOVI ) 75 MG capsule Take 6 capsules (450 mg total) by mouth daily. (Patient taking differently: Take 225 mg by mouth daily.) 180 capsule 3   fluticasone (FLONASE) 50 MCG/ACT nasal spray Place 2 sprays into both nostrils daily as needed for allergies.     furosemide  (LASIX ) 20 MG tablet Take 1 tablet (20 mg total) by mouth daily as needed. For weight gain or swelling 30 tablet 11   loperamide  (IMODIUM  A-D) 2 MG tablet Take 1 tablet (2 mg total) by mouth 4 (four) times daily as needed for diarrhea or loose stools. (Patient taking differently: Take 2 mg by mouth daily as needed for diarrhea or loose stools.) 30 tablet 0   losartan  (COZAAR ) 25 MG tablet Take 1 tablet (25 mg total) by mouth daily. 90 tablet 2   ondansetron  (ZOFRAN -ODT) 4 MG disintegrating tablet Take 1 tablet (4 mg total) by mouth every 8 (eight) hours as needed for nausea or vomiting. 20 tablet 0   pantoprazole  (PROTONIX ) 40 MG tablet Take 1 tablet (40 mg total) by mouth 2 (two) times daily. (Patient taking differently: Take 40 mg by mouth daily.) 60 tablet 0   POTASSIUM PO Take 1 tablet by mouth daily at 12 noon.     Probiotic Product (PROBIOTIC PEARLS ADVANTAGE PO) Take 1 capsule by mouth in the morning.     protein supplement shake (PREMIER PROTEIN) LIQD Take 11 oz by mouth daily at 12  noon.     silodosin  (RAPAFLO ) 8 MG CAPS capsule Take 8 mg by mouth daily.     No current facility-administered medications for this visit.    SURGICAL HISTORY:  Past Surgical History:  Procedure Laterality Date   BRONCHIAL BIOPSY  08/27/2022   Procedure: BRONCHIAL BIOPSIES;  Surgeon: Shelah Lamar RAMAN, MD;  Location: [facility] ENDOSCOPY;  Service: Pulmonary;;   BRONCHIAL BRUSHINGS  08/27/2022   Procedure: BRONCHIAL BRUSHINGS;  Surgeon: Shelah Lamar RAMAN, MD;  Location: [facility] ENDOSCOPY;  Service: Pulmonary;;   BRONCHIAL NEEDLE ASPIRATION BIOPSY  08/27/2022   Procedure: BRONCHIAL NEEDLE ASPIRATION BIOPSIES;  Surgeon: Shelah Lamar RAMAN, MD;  Location: [facility] ENDOSCOPY;  Service: Pulmonary;;   CONSTRICTING FINGER RING EXCISION Left    CYST REMOVAL TRUNK Left    left posterior shoulder   FIDUCIAL MARKER PLACEMENT  08/27/2022   Procedure: FIDUCIAL MARKER PLACEMENT;  Surgeon: Shelah Lamar RAMAN, MD;  Location: [facility] ENDOSCOPY;  Service: Pulmonary;;   GOLD SEED IMPLANT N/A 10/02/2022   Procedure: GOLD SEED IMPLANT;  Surgeon: Lovie Arlyss CROME, MD;  Location: [facility];  Service: Urology;  Laterality: N/A;   index finger Left    IR FACET JT INJ C/T  2ND LEVEL RIGHT W/FL/CT  01/20/2024   IR FACET JT INJ C/T  SINGLE LEVEL RIGHT W/FL/CT  01/20/2024   IR FACET JT INJ C/T 3RD PLUS LEVEL RIGHT W/FL/CT  01/20/2024   IR RADIOLOGIST EVAL & MGMT  02/19/2024   IR THORACENTESIS ASP PLEURAL SPACE W/IMG GUIDE  06/14/2023   Prostate needle biopsy     SPACE OAR INSTILLATION N/A 10/02/2022   Procedure: SPACE OAR INSTILLATION;  Surgeon: Lovie Arlyss CROME, MD;  Location: [facility];  Service: Urology;  Laterality: N/A;    REVIEW OF SYSTEMS:  Constitutional: positive for fatigue Eyes: negative Ears, nose, mouth, throat, and face: negative Respiratory: positive for dyspnea on exertion Cardiovascular: negative Gastrointestinal: negative Genitourinary:negative Integument/breast: negative Hematologic/lymphatic:  negative Musculoskeletal:negative Neurological: negative Behavioral/Psych: negative Endocrine: negative Allergic/Immunologic: negative   PHYSICAL EXAMINATION: General appearance: alert, cooperative, fatigued, and no distress Head: Normocephalic, without obvious abnormality, atraumatic Neck: no adenopathy, no JVD, supple, symmetrical, trachea midline, and thyroid  not enlarged, symmetric, no tenderness/mass/nodules Lymph nodes: Cervical, supraclavicular, and axillary nodes normal. Resp: clear to auscultation bilaterally Back: symmetric, no curvature. ROM normal. No CVA tenderness. Cardio: regular rate and rhythm, S1, S2 normal, no murmur, click, rub or gallop GI: soft, non-tender; bowel sounds normal; no masses,  no organomegaly Extremities: extremities normal, atraumatic, no cyanosis or edema Neurologic: Alert and oriented X 3, normal strength and tone. Normal symmetric reflexes. Normal coordination and gait  ECOG PERFORMANCE STATUS: 1 - Symptomatic but completely ambulatory  Blood pressure (!) 152/72, pulse 72, temperature 97.6 F (36.4 C), temperature source Temporal, resp. rate 17, height 6' 1 (1.854 m), weight 190 lb (86.2 kg), SpO2 100%.  LABORATORY DATA: Lab Results  Component Value Date   WBC 5.6 05/14/2024   HGB 11.3 (L) 05/14/2024   HCT 32.7 (L) 05/14/2024   MCV 88.1 05/14/2024   PLT 95 (L) 05/14/2024      Chemistry      Component Value Date/Time   NA 139 05/14/2024 0942   K 5.0 05/14/2024 0942   CL 104 05/14/2024 0942   CO2 25 05/14/2024 0942   BUN 27 (H) 05/14/2024 0942   CREATININE 1.11 05/14/2024 0942      Component Value Date/Time   CALCIUM 9.3 05/14/2024 0942   ALKPHOS 68 05/14/2024 0942   AST 18 05/14/2024 0942   ALT 11 05/14/2024 0942   BILITOT 0.5 05/14/2024 0942       RADIOGRAPHIC STUDIES: ECHOCARDIOGRAM COMPLETE Result Date: 04/27/2024    ECHOCARDIOGRAM REPORT   Patient Name:   Chad Moreno Date of Exam: 04/27/2024 Medical Rec #:  [id number]        Height:       73.0 in Accession #:    [id number]      Weight:       196.0 lb Date of Birth:  12-12-1940       BSA:          2.133 m Patient Age:    83 years        BP:           155/75 mmHg Patient Gender: M               HR:  72 bpm. Exam Location:  [address] Procedure: 2D Echo, 3D Echo, Cardiac Doppler and Color Doppler (Both Spectral            and Color Flow Doppler were utilized during procedure). Indications:    I50.00 CHF  History:        Patient has prior history of Echocardiogram examinations, most                 recent 01/24/2024. Signs/Symptoms:Dizziness/Lightheadedness.  Sonographer:    Waldo Guadalajara RCS Referring Phys: [id number] [place] L [place] IMPRESSIONS  1. Left ventricular ejection fraction, by estimation, is 60 to 65%. Left ventricular ejection fraction by 3D volume is 65 %. The left ventricle has normal function. The left ventricle has no regional wall motion abnormalities. Left ventricular diastolic  parameters are consistent with Grade I diastolic dysfunction (impaired relaxation).  2. Right ventricular systolic function is normal. The right ventricular size is normal. There is normal pulmonary artery systolic pressure.  3. The mitral valve is normal in structure. No evidence of mitral valve regurgitation. No evidence of mitral stenosis.  4. The aortic valve is tricuspid. Aortic valve regurgitation is mild. Aortic valve sclerosis/calcification is present, without any evidence of aortic stenosis.  5. Aortic dilatation noted. There is mild dilatation of the aortic root, measuring 42 mm.  6. The inferior vena cava is normal in size with greater than 50% respiratory variability, suggesting right atrial pressure of 3 mmHg. Comparison(s): A prior study was performed on 01/24/2024. The right ventricular systolic function has improved. LV function and regional wall motion abnormalities have resolved. Mild aortic regurgitation now present. FINDINGS  Left Ventricle: Left ventricular  ejection fraction, by estimation, is 60 to 65%. Left ventricular ejection fraction by 3D volume is 65 %. The left ventricle has normal function. The left ventricle has no regional wall motion abnormalities. The left ventricular internal cavity size was normal in size. There is no left ventricular hypertrophy. Left ventricular diastolic parameters are consistent with Grade I diastolic dysfunction (impaired relaxation). Right Ventricle: The right ventricular size is normal. No increase in right ventricular wall thickness. Right ventricular systolic function is normal. There is normal pulmonary artery systolic pressure. The tricuspid regurgitant velocity is 1.97 m/s, and  with an assumed right atrial pressure of 3 mmHg, the estimated right ventricular systolic pressure is 18.5 mmHg. Left Atrium: Left atrial size was normal in size. Right Atrium: Right atrial size was normal in size. Pericardium: There is no evidence of pericardial effusion. Mitral Valve: The mitral valve is normal in structure. No evidence of mitral valve regurgitation. No evidence of mitral valve stenosis. Tricuspid Valve: The tricuspid valve is normal in structure. Tricuspid valve regurgitation is trivial. No evidence of tricuspid stenosis. Aortic Valve: The aortic valve is tricuspid. Aortic valve regurgitation is mild. Aortic regurgitation PHT measures 466 msec. Aortic valve sclerosis/calcification is present, without any evidence of aortic stenosis. Pulmonic Valve: The pulmonic valve was normal in structure. Pulmonic valve regurgitation is not visualized. No evidence of pulmonic stenosis. Aorta: Aortic dilatation noted. There is mild dilatation of the aortic root, measuring 42 mm. Venous: The inferior vena cava is normal in size with greater than 50% respiratory variability, suggesting right atrial pressure of 3 mmHg. IAS/Shunts: No atrial level shunt detected by color flow Doppler.  LEFT VENTRICLE PLAX 2D LVIDd:         5.00 cm         Diastology  LVIDs:         3.60  cm         LV e' medial:    4.79 cm/s LV PW:         0.90 cm         LV E/e' medial:  9.0 LV IVS:        0.90 cm         LV e' lateral:   7.83 cm/s LVOT diam:     2.00 cm         LV E/e' lateral: 5.5 LV SV:         70 LV SV Index:   33 LVOT Area:     3.14 cm        3D Volume EF LV IVRT:       137 msec        LV 3D EF:    Left                                             ventricul                                             ar                                             ejection                                             fraction                                             by 3D                                             volume is                                             65 %.                                 3D Volume EF:                                3D EF:        65 %                                LV EDV:       101 ml  LV ESV:       35 ml                                LV SV:        66 ml RIGHT VENTRICLE RV Basal diam:  2.90 cm RV S prime:     11.40 cm/s TAPSE (M-mode): 1.6 cm RVSP:           18.5 mmHg LEFT ATRIUM             Index        RIGHT ATRIUM           Index LA diam:        4.40 cm 2.06 cm/m   RA Pressure: 3.00 mmHg LA Vol (A2C):   43.8 ml 20.53 ml/m  RA Area:     9.77 cm LA Vol (A4C):   27.5 ml 12.89 ml/m  RA Volume:   15.50 ml  7.27 ml/m LA Biplane Vol: 37.7 ml 17.67 ml/m  AORTIC VALVE LVOT Vmax:   115.00 cm/s LVOT Vmean:  77.700 cm/s LVOT VTI:    0.223 m AI PHT:      466 msec  AORTA Ao Root diam: 4.20 cm Ao Asc diam:  3.50 cm MITRAL VALVE               TRICUSPID VALVE MV Area (PHT):             TR Peak grad:   15.5 mmHg MV Decel Time:             TR Vmax:        197.00 cm/s MV E velocity: 43.20 cm/s  Estimated RAP:  3.00 mmHg MV A velocity: 81.50 cm/s  RVSP:           18.5 mmHg MV E/A ratio:  0.53                            SHUNTS                            Systemic VTI:  0.22 m                            Systemic Diam: 2.00 cm Emeline Calender  Electronically signed by Emeline Calender Signature Date/Time: 04/27/2024/10:23:05 AM    Final       ASSESSMENT AND PLAN: This is a very pleasant 83 years old white male with Recurrent/metastatic (T1c, N3, M1b) non-small cell lung cancer, adenocarcinoma initially diagnosed as early stage disease, stage I in March 2024 involving the right lower lobe and left upper lobe status post SBRT completed in May 2024.  The patient presented with recurrent and metastatic disease to bilateral supraclavicular, subpectoral, left axillary as well as upper abdominal lymphadenopathy and malignant left pleural effusion in December 2024 with confirmation of malignancy from the pleural effusion. Molecular studies by foundation 1 showed positive BRAF V600E mutation and PD-L1 expression of 1%. The patient is currently on treatment with encorafenib  450 mg p.o. twice daily in addition to binimetinib  45 mg milligram p.o. twice daily.  First dose was July 22, 2023. He continues to tolerate this treatment fairly well with no concerning adverse effects.  He is currently on a 50% dose reduction of encorafenib  and binimetinib  and  tolerating it fairly well. Assessment and Plan Assessment & Plan Metastatic non-small cell lung adenocarcinoma with BRAF V600E mutation Diagnosed in December 2024. Currently on oncorafenib 450 mg daily and pemazyre 45 mg PO BID, started in January 2025. Blood work remains stable. No significant side effects from reduced chemotherapy dose. Recent echocardiogram shows normal cardiac function with no congestive heart failure. - Continue oncorafenib 450 mg daily and pemazyre 45 mg PO BID. - Will schedule follow-up scan in late December to assess treatment response. - Will plan follow-up visit after return from vacation in late December.  Dry cough Intermittent dry cough for the past 2-3 weeks, not associated with sputum production. Cough may be related to focal cord issue, as it interfered with a recent throat  procedure. Acid reflux is being managed with medication. - Continue current acid reflux medication. The patient was advised to call immediately if he has any concerning symptoms in the interval.  The patient voices understanding of current disease status and treatment options and is in agreement with the current care plan.  All questions were answered. The patient knows to call the clinic with any problems, questions or concerns. We can certainly see the patient much sooner if necessary.  The total time spent in the appointment was 30 minutes including review of chart and various tests results, discussions about plan of care and coordination of care plan .   Disclaimer: This note was dictated with voice recognition software. Similar sounding words can inadvertently be transcribed and may not be corrected upon review.

## 2024-05-15 ENCOUNTER — Other Ambulatory Visit: Payer: Self-pay

## 2024-05-15 ENCOUNTER — Encounter (HOSPITAL_COMMUNITY): Payer: Self-pay

## 2024-05-15 ENCOUNTER — Other Ambulatory Visit: Payer: Self-pay | Admitting: Pharmacist

## 2024-05-15 ENCOUNTER — Other Ambulatory Visit: Payer: Self-pay | Admitting: Physician Assistant

## 2024-05-15 DIAGNOSIS — C3492 Malignant neoplasm of unspecified part of left bronchus or lung: Secondary | ICD-10-CM

## 2024-05-15 MED ORDER — BINIMETINIB 15 MG PO TABS
45.0000 mg | ORAL_TABLET | Freq: Two times a day (BID) | ORAL | 3 refills | Status: AC
  Filled 2024-05-15 – 2024-06-04 (×3): qty 180, 30d supply, fill #0
  Filled 2024-07-08: qty 180, 30d supply, fill #1

## 2024-05-15 MED ORDER — ENCORAFENIB 75 MG PO CAPS
450.0000 mg | ORAL_CAPSULE | Freq: Every day | ORAL | 3 refills | Status: AC
  Filled 2024-05-15 – 2024-06-04 (×3): qty 180, 30d supply, fill #0
  Filled 2024-07-08: qty 180, 30d supply, fill #1

## 2024-05-15 NOTE — Progress Notes (Signed)
 PCP - Dr. Searcy Overcast Cardiologist - Dr. [name]  PPM/ICD - denies   Chest x-ray - 01/24/24 EKG - 02/05/24 Stress Test - denies ECHO - denies Cardiac Cath - denies  CPAP - denies  DM- denies  ASA/Blood Thinner Instructions: n/a   ERAS Protcol - no, NPO  COVID TEST- n/a  Anesthesia review: yes, cardiac hx  Patient verbally denies any shortness of breath, fever, cough and chest pain during phone call      Questions were answered. Patient verbalized understanding of instructions.

## 2024-05-15 NOTE — Progress Notes (Signed)
 Palliative Medicine [facility]  [phone] [phone]   Name: Chad Moreno Date: 05/15/2024 MRN: [id number]  DOB: [birth year]  Patient Care Team: [name], MD as PCP - General (Internal Medicine) Jordan, Peter M, MD as PCP - Cardiology (Cardiology) Livingston Rigg, MD as Consulting Physician (Dermatology) Vertell Pont, RN as Oncology Nurse Navigator Bouchard, Duwaine BROCKS, RN as Oncology Nurse Navigator Pickenpack-Cousar, Fannie SAILOR, NP as Nurse Practitioner (Hospice and Palliative Medicine) [name], NP as Nurse Practitioner (Cardiology)   INTERVAL HISTORY: Chad Moreno is a 83 y.o. male with  oncologic medical history including recurrent metastatic non-small cell lung cancer initially diagnosed in March 2024 s/p SBRT.  Now with metastatic disease including left axillary, bilateral supraclavicular, abdominal lymphadenopathy, and malignant pleural effusion (05/2023) s/p palliative radiation to right chest completed April 2025.  Patient currently on Encorafenib  and binimetinib .  Palliative is seeing patient for symptom management and goals of care.   SOCIAL HISTORY:     reports that he quit smoking about 44 years ago. His smoking use included cigarettes. He started smoking about 54 years ago. He has a 5 pack-year smoking history. He has never used smokeless tobacco. He reports current alcohol  use of about 3.0 standard drinks of alcohol  per week. He reports that he does not use drugs.  ADVANCE DIRECTIVES:  Patient to bring in documents to be scanned on file. He reports his wife Chad Moreno is his museum/gallery exhibitions officer.   CODE STATUS: DNR  PAST MEDICAL HISTORY: Past Medical History:  Diagnosis Date   Acute hypoxemic respiratory failure (HCC) 01/23/2024   Acute systolic CHF (congestive heart failure) (HCC) 01/24/2024   Age-related cataract of left eye    Amblyopia, right eye    Arthritis    Bacteremia due to Escherichia  coli 02/01/2024   Cancer (HCC)    prostate, lungs   CAP (community acquired pneumonia) 01/23/2024   Dysuria    ED (erectile dysfunction)    Elevated PSA    Epidermal cyst    Family history of prostate cancer    Gout    Hip pain    Histoplasmosis    Hyperlipidemia    Hypermetropia, left eye    Hypertension    Hypokalemia 01/25/2024   Impaired glucose tolerance    Joint pain    Left shoulder pain    Lower back pain    Melanocytic nevi, unspecified    Nummular dermatitis    Ocular hypertension    Palmar fascial fibromatosis    Prostate cancer screening    Regular astigmatism, right eye    Senile nuclear sclerosis    SIRS (systemic inflammatory response syndrome) (HCC) 01/23/2024    ALLERGIES:  is allergic to sulfa antibiotics, ciprofloxacin, and ciprofloxacin hcl.  MEDICATIONS:  Current Outpatient Medications  Medication Sig Dispense Refill   ascorbic acid  (VITAMIN C ) 500 MG tablet Take 1 tablet (500 mg total) by mouth daily.     binimetinib  (MEKTOVI ) 15 MG tablet Take 3 tablets (45 mg total) by mouth daily at 12 noon.     calcium carbonate (OSCAL) 1500 (600 Ca) MG TABS tablet Take 600 mg of elemental calcium by mouth daily with breakfast.     celecoxib  (CELEBREX ) 200 MG capsule TAKE 1 CAPSULE BY MOUTH 2 TIMES A DAY 60 capsule 3   encorafenib  (BRAFTOVI ) 75 MG capsule Take 2 capsules (150 mg total) by mouth daily.     fluticasone (FLONASE) 50 MCG/ACT nasal spray Place 2 sprays  into both nostrils daily as needed for allergies.     furosemide  (LASIX ) 20 MG tablet Take 1 tablet (20 mg total) by mouth daily as needed. For weight gain or swelling 30 tablet 11   loperamide  (IMODIUM  A-D) 2 MG tablet Take 1 tablet (2 mg total) by mouth 4 (four) times daily as needed for diarrhea or loose stools. (Patient taking differently: Take 2 mg by mouth daily as needed for diarrhea or loose stools.) 30 tablet 0   losartan  (COZAAR ) 25 MG tablet Take 1 tablet (25 mg total) by mouth daily. 90 tablet  2   ondansetron  (ZOFRAN -ODT) 4 MG disintegrating tablet Take 1 tablet (4 mg total) by mouth every 8 (eight) hours as needed for nausea or vomiting. 20 tablet 0   pantoprazole  (PROTONIX ) 40 MG tablet Take 1 tablet (40 mg total) by mouth 2 (two) times daily. (Patient taking differently: Take 40 mg by mouth daily.) 60 tablet 0   POTASSIUM PO Take 1 tablet by mouth daily at 12 noon.     Probiotic Product (PROBIOTIC PEARLS ADVANTAGE PO) Take 1 capsule by mouth in the morning.     protein supplement shake (PREMIER PROTEIN) LIQD Take 11 oz by mouth daily at 12 noon.     silodosin  (RAPAFLO ) 8 MG CAPS capsule Take 8 mg by mouth daily.     No current facility-administered medications for this visit.    VITAL SIGNS: There were no vitals taken for this visit. There were no vitals filed for this visit.  Estimated body mass index is 25.07 kg/m as calculated from the following:   Height as of an earlier encounter on 05/14/24: 6' 1 (1.854 m).   Weight as of an earlier encounter on 05/14/24: 190 lb (86.2 kg).   PERFORMANCE STATUS (ECOG) : 1 - Symptomatic but completely ambulatory  Assessment NAD RRR Normal breathing pattern AAO x3  IMPRESSION: Discussed the use of AI scribe software for clinical note transcription with the patient, who gave verbal consent to proceed.  History of Present Illness Chad Moreno is an 83 year old male with metastatic cancer who presented to the clinic for symptom management follow-up. His wife is also present. Denies concerns of nausea, vomiting, constipation, or diarrhea. He is doing well overall.   He has been experiencing a dry cough for the past three weeks, described as a 'tickle' in the throat, lasting only a few seconds, and is non-productive. He suspects it may be related to his vocal cord issue, as he has a paralyzed vocal cord that is scheduled for a procedure next Tuesday. Previous attempts to fix the vocal cord were unsuccessful due to coughing  during the procedure.  He has a history of congestive heart failure, diagnosed in July when he was hospitalized. At that time, one side of his heart was pumping at 50% and the other at 30%. A recent echocardiogram within the last three weeks showed both sides now pumping at 65%, with clear chambers and arteries, according to the results relayed to him. He mentions his weight fluctuates between 189 and 192 pounds, but his appetite remains good. No uncontrolled pain is reported, and bowel movements are normal.  He plans to start physical training in December to regain strength in his back and legs, having not used his trainer since July due to his health issues. We discussed water aerobics as a resource for strengthening also.   All symptoms well managed. All questions answered and support provided.   All questions  answered and support provided.   Goals of Care 12/03/23: We discussed his current illness and what it means in the larger context of his on-going co-morbidities. Natural disease trajectory and expectations were discussed. Mr. Hilburn and his wife are realistic in their understanding of his metastatic cancer and plan of care.    I empathetically approach discussions regarding advanced directives, CODE STATUS, and healthcare limitations.  Although patient does not have an advanced directive on file he states he does have a completed document.  Encouraged patient to bring in to allow for filing.  Patient states his wife Barnie is his medical decision-maker in the event he is unable to speak for himself.  His desires for natural death with no life-sustaining measures.  DNR/DNI.  No artificial feedings long-term.   Patient and wife are clear in her expressed wishes to continue to treat the treatable allow him every opportunity to continue to thrive.  His quality of life is most important to him.  Will like his symptoms to be managed to minimize discomfort.    I discussed the importance of  continued conversation with family and their medical providers regarding overall plan of care and treatment options, ensuring decisions are within the context of the patients values and GOCs. Assessment & Plan Pain Much improved and reports pretty much resolved s/p paravertebral nerve block on 7/28. Only requiring occasional Tylenol  for minors aches.   Weight monitoring for fluid status Ongoing weight monitoring to manage fluid status. Recent weight recorded at 196 lbs, likely closer to 192 lbs accounting for clothing. - Continue daily weight monitoring - Take Lasix  if weight increases by more than 3 lbs in one day or 5 lbs in one week  Paralyzed vocal cord with associated dry cough Paralyzed vocal cord causing a dry cough. Previous attempt to place fillers was unsuccessful due to coughing during the procedure. Scheduled for a procedure under sedation next Tuesday to address the paralyzed vocal cord. - Will proceed with scheduled procedure under sedation next Tuesday to address paralyzed vocal cord.  History of congestive heart failure, stable Congestive heart failure with previous echocardiogram showing right side pumping at 50% and left side at 30%. Recent echocardiogram shows improvement with both sides pumping at 65%. Chambers and arteries are clear. - Continue current management and monitoring of heart function.  I will plan to see patient back in 6 weeks. Sooner if needed.   Patient expressed understanding and was in agreement with this plan. He also understands that He can call the clinic at any time with any questions, concerns, or complaints.   Any controlled substances utilized were prescribed in the context of palliative care. PDMP has been reviewed.   Visit consisted of counseling and education dealing with the complex and emotionally intense issues of symptom management and palliative care in the setting of serious and potentially life-threatening illness.  Levon Borer, AGPCNP-BC  Palliative Medicine Team/[facility]

## 2024-05-15 NOTE — Pre-Procedure Instructions (Signed)
-------------    SDW INSTRUCTIONS given:  Your procedure is scheduled on 11/25.  Report to [facility] Main Entrance A at 11:10 A.M., and check in at the Admitting office.  Any questions or running late day of surgery: call [phone]    Remember:  Do not eat or drink after midnight the night before your surgery    Take these medicines the morning of surgery with A SIP OF WATER  encorafenib  (BRAFTOVI )  pantoprazole  (PROTONIX )  silodosin  (RAPAFLO )    May take these medicines IF NEEDED: Flonase Zofran   As of today, STOP taking any Aspirin (unless otherwise instructed by your surgeon) Aleve, Naproxen, Ibuprofen, Motrin, Advil, Goody's, BC's, all herbal medications, fish oil, and all vitamins.   Do NOT Smoke (Tobacco/Vaping) 24 hours prior to your procedure  If you use a CPAP at night, you may bring all equipment for your overnight stay.     You will be asked to remove any contacts, glasses, piercing's, hearing aid's, dentures/partials prior to surgery. Please bring cases for these items if needed.     Patients discharged the day of surgery will not be allowed to drive home, and someone needs to stay with them for 24 hours.  SURGICAL WAITING ROOM VISITATION Patients may have no more than 2 support people in the waiting area - these visitors may rotate.   Pre-op nurse will coordinate an appropriate time for 1 ADULT support person, who may not rotate, to accompany patient in pre-op.  Children under the age of 73 must have an adult with them who is not the patient and must remain in the main waiting area with an adult.  If the patient needs to stay at the hospital during part of their recovery, the visitor guidelines for inpatient rooms apply.  Please refer to the [facility] website for the visitor guidelines for any additional information.   Special instructions:   [facility]- Preparing For Surgery   Please follow these instructions carefully.   Shower the NIGHT BEFORE  SURGERY and the MORNING OF SURGERY with DIAL Soap.   Pat yourself dry with a CLEAN TOWEL.  Wear CLEAN PAJAMAS to bed the night before surgery  Place CLEAN SHEETS on your bed the night of your first shower and DO NOT SLEEP WITH PETS.   Additional instructions for the day of surgery: DO NOT APPLY any lotions, deodorants, cologne, or perfumes.   Do not wear jewelry or makeup Do not wear nail polish, gel polish, artificial nails, or any other type of covering on natural nails (fingers and toes) Do not bring valuables to the hospital. [facility] is not responsible for valuables/personal belongings. Put on clean/comfortable clothes.  Please brush your teeth.  Ask your nurse before applying any prescription medications to the skin.

## 2024-05-18 ENCOUNTER — Other Ambulatory Visit: Payer: Self-pay | Admitting: Nurse Practitioner

## 2024-05-18 NOTE — Progress Notes (Signed)
 Anesthesia Chart Review: Same day workup  83 year old male with pertinent history including HTN, HLD, GERD on PPI, recurrent metastatic non-small cell lung cancer initially diagnosed in March 2024 s/p SBRT - now with metastatic disease including left axillary, bilateral supraclavicular, abdominal lymphadenopathy, and malignant pleural effusion (05/2023) s/p palliative radiation to right chest completed April 2025. Patient currently on Encorafenib  and binimetinib .   Recent diagnosis of systolic heart failure. Admitted 12/30/2023 for SBO/GI bleed.  Conservative management was recommended per general surgery.  GI bleed felt to be due to cecal colitis/diverticulosis.  Received PRBCs and IV PPI.  Plan for outpatient GI EGD/colonoscopy.  Course complicated by ESBL UTI treated with antibiotics.  He was discharged home in stable condition. He was readmitted on 01/17/2024 with dyspnea initially and required BiPAP therapy.  Echocardiogram showed LVEF of 30%, RWMA's, mild LVH, RV mildly reduced.  Questionable Takotsubo cardiomyopathy versus LAD infarct versus cardiomyopathy from immunotherapy. Immunotherapy was held.  Cardiology was consulted.  His high sensitive troponins were mildly elevated but flat and EKG was without acute ST changes.  Blood cultures were positive for a ESBL E. coli and ID was consulted.  He was started on antibiotics and diuresed with IV Lasix  and GDMT was titrated.  Patient wished not to have invasive procedures.  GDMT was limited by low BP and orthostasis.  He was discharged home with a weight of 188 LBS.  He followed up with oncology on 01/28/2024 and encorafenib  and binimetinib  restarted at reduced doses.   Seen in outpatient cardiology follow-up by Lum Louis, NP on 02/26/2024.  Per note, he was feeling significantly better, euvolemic, not requiring daily loop diuretic therapy.  He reported stable weight and daily activity with exercises including walking, band exercises and leg lifts  without exertional symptoms or limitations.  He was continued on current medications with recommendation for follow-up echocardiogram to reassess LV function.  Echo 04/27/2024 showed LVEF normalized, 60 to 65%, grade 1 DD, normal RV systolic function, mild aortic regurgitation.  No further interventions recommended at that time.  Follows with otolaryngology for history of left vocal cord paralysis.  She previously responded well to injection laryngeal plasty performed in the office in April 2025.  Repeat injection attempted in office 05/11/2024 but unable to be completed due to complexity of anatomy and patient tolerance.  Recommended to proceed with injection in OR.  CMP and CBC from 05/14/2024 reviewed, mild anemia at 11.3, moderate chronic thrombocytopenia platelets 95, otherwise unremarkable.  Patient will need day of surgery evaluation.  EKG 02/05/2024: Normal sinus rhythm.  Rate 76. T wave inversion Anterolateral ischemia. Prolonged QT  TTE 04/27/2024: 1. Left ventricular ejection fraction, by estimation, is 60 to 65%. Left  ventricular ejection fraction by 3D volume is 65 %. The left ventricle has  normal function. The left ventricle has no regional wall motion  abnormalities. Left ventricular diastolic   parameters are consistent with Grade I diastolic dysfunction (impaired  relaxation).   2. Right ventricular systolic function is normal. The right ventricular  size is normal. There is normal pulmonary artery systolic pressure.   3. The mitral valve is normal in structure. No evidence of mitral valve  regurgitation. No evidence of mitral stenosis.   4. The aortic valve is tricuspid. Aortic valve regurgitation is mild.  Aortic valve sclerosis/calcification is present, without any evidence of  aortic stenosis.   5. Aortic dilatation noted. There is mild dilatation of the aortic root,  measuring 42 mm.   6. The inferior vena cava  is normal in size with greater than 50%  respiratory  variability, suggesting right atrial pressure of 3 mmHg.   Comparison(s): A prior study was performed on 01/24/2024. The right  ventricular systolic function has improved. LV function and regional wall  motion abnormalities have resolved. Mild aortic regurgitation now present.      Lynwood Geofm RIGGERS [facility] Short Stay Center/Anesthesiology Phone [phone] 05/18/2024 11:03 AM

## 2024-05-18 NOTE — Anesthesia Preprocedure Evaluation (Signed)
 Anesthesia Evaluation  Patient identified by MRN, date of birth, ID band Patient awake    Reviewed: Allergy & Precautions, Patient's Chart, lab work & pertinent test results, Unable to perform ROS - Chart review only  Airway Mallampati: II  TM Distance: >3 FB Neck ROM: Full    Dental no notable dental hx.    Pulmonary pneumonia, former smoker Non Small cell lung Ca - undergoing RT   Pulmonary exam normal        Cardiovascular hypertension, Pt. on medications +CHF   Rhythm:Regular Rate:Normal  TTE 04/27/2024: 1. Left ventricular ejection fraction, by estimation, is 60 to 65%. Left  ventricular ejection fraction by 3D volume is 65 %. The left ventricle has  normal function. The left ventricle has no regional wall motion  abnormalities. Left ventricular diastolic   parameters are consistent with Grade I diastolic dysfunction (impaired  relaxation).   2. Right ventricular systolic function is normal. The right ventricular  size is normal. There is normal pulmonary artery systolic pressure.   3. The mitral valve is normal in structure. No evidence of mitral valve  regurgitation. No evidence of mitral stenosis.   4. The aortic valve is tricuspid. Aortic valve regurgitation is mild.  Aortic valve sclerosis/calcification is present, without any evidence of  aortic stenosis.   5. Aortic dilatation noted. There is mild dilatation of the aortic root,  measuring 42 mm.   6. The inferior vena cava is normal in size with greater than 50%  respiratory variability, suggesting right atrial pressure of 3 mmHg.   Comparison(s): A prior study was performed on 01/24/2024. The right  ventricular systolic function has improved. LV function and regional wall  motion abnormalities have resolved. Mild aortic regurgitation now present.     Neuro/Psych Ocular hypertension  negative psych ROS   GI/Hepatic negative GI ROS, Neg liver ROS,,,   Endo/Other  Hyperlipidemia Impaired fasting Glucose  Renal/GU negative Renal ROS   Prostate Ca    Musculoskeletal  (+) Arthritis , Osteoarthritis,    Abdominal Normal abdominal exam  (+)   Peds  Hematology  (+) Blood dyscrasia, anemia   Anesthesia Other Findings HTN, HLD, GERD on PPI, recurrent metastatic non-small cell lung cancer initially diagnosed in March 2024 s/p SBRT - now with metastatic disease including left axillary, bilateral supraclavicular, abdominal lymphadenopathy, and malignant pleural effusion (05/2023) s/p palliative radiation to right chest completed April 2025. Patient currently on Encorafenib  and binimetinib .   Reproductive/Obstetrics                              Anesthesia Physical Anesthesia Plan  ASA: 3  Anesthesia Plan: General   Post-op Pain Management: Tylenol  PO (pre-op)*   Induction: Intravenous  PONV Risk Score and Plan: 3 and Treatment may vary due to age or medical condition, Ondansetron  and Dexamethasone   Airway Management Planned: Mask and Oral ETT  Additional Equipment: None  Intra-op Plan:   Post-operative Plan: Extubation in OR  Informed Consent: I have reviewed the patients History and Physical, chart, labs and discussed the procedure including the risks, benefits and alternatives for the proposed anesthesia with the patient or authorized representative who has indicated his/her understanding and acceptance.     Dental advisory given  Plan Discussed with: CRNA  Anesthesia Plan Comments: (PAT note by Lynwood Hope, PA-C: 83 year old male with pertinent history including HTN, HLD, GERD on PPI, recurrent metastatic non-small cell lung cancer initially diagnosed in March 2024  s/p SBRT - now with metastatic disease including left axillary, bilateral supraclavicular, abdominal lymphadenopathy, and malignant pleural effusion (05/2023) s/p palliative radiation to right chest completed April 2025. Patient  currently on Encorafenib  and binimetinib .   Recent diagnosis of systolic heart failure. Admitted 12/30/2023 for SBO/GI bleed.  Conservative management was recommended per general surgery.  GI bleed felt to be due to cecal colitis/diverticulosis.  Received PRBCs and IV PPI.  Plan for outpatient GI EGD/colonoscopy.  Course complicated by ESBL UTI treated with antibiotics.  He was discharged home in stable condition. He was readmitted on 01/17/2024 with dyspnea initially and required BiPAP therapy.  Echocardiogram showed LVEF of 30%, RWMA's, mild LVH, RV mildly reduced.  Questionable Takotsubo cardiomyopathy versus LAD infarct versus cardiomyopathy from immunotherapy. Immunotherapy was held.  Cardiology was consulted.  His high sensitive troponins were mildly elevated but flat and EKG was without acute ST changes.  Blood cultures were positive for a ESBL E. coli and ID was consulted.  He was started on antibiotics and diuresed with IV Lasix  and GDMT was titrated.  Patient wished not to have invasive procedures.  GDMT was limited by low BP and orthostasis.  He was discharged home with a weight of 188 LBS.  He followed up with oncology on 01/28/2024 and encorafenib  and binimetinib  restarted at reduced doses.   Seen in outpatient cardiology follow-up by Lum Louis, NP on 02/26/2024.  Per note, he was feeling significantly better, euvolemic, not requiring daily loop diuretic therapy.  He reported stable weight and daily activity with exercises including walking, band exercises and leg lifts without exertional symptoms or limitations.  He was continued on current medications with recommendation for follow-up echocardiogram to reassess LV function.  Echo 04/27/2024 showed LVEF normalized, 60 to 65%, grade 1 DD, normal RV systolic function, mild aortic regurgitation.  No further interventions recommended at that time.  Follows with otolaryngology for history of left vocal cord paralysis.  She previously responded well  to injection laryngeal plasty performed in the office in April 2025.  Repeat injection attempted in office 05/11/2024 but unable to be completed due to complexity of anatomy and patient tolerance.  Recommended to proceed with injection in OR.  CMP and CBC from 05/14/2024 reviewed, mild anemia at 11.3, moderate chronic thrombocytopenia platelets 95, otherwise unremarkable.  Patient will need day of surgery evaluation.  EKG 02/05/2024: Normal sinus rhythm.  Rate 76. T wave inversion Anterolateral ischemia. Prolonged QT  TTE 04/27/2024: 1. Left ventricular ejection fraction, by estimation, is 60 to 65%. Left  ventricular ejection fraction by 3D volume is 65 %. The left ventricle has  normal function. The left ventricle has no regional wall motion  abnormalities. Left ventricular diastolic  parameters are consistent with Grade I diastolic dysfunction (impaired  relaxation).  2. Right ventricular systolic function is normal. The right ventricular  size is normal. There is normal pulmonary artery systolic pressure.  3. The mitral valve is normal in structure. No evidence of mitral valve  regurgitation. No evidence of mitral stenosis.  4. The aortic valve is tricuspid. Aortic valve regurgitation is mild.  Aortic valve sclerosis/calcification is present, without any evidence of  aortic stenosis.  5. Aortic dilatation noted. There is mild dilatation of the aortic root,  measuring 42 mm.  6. The inferior vena cava is normal in size with greater than 50%  respiratory variability, suggesting right atrial pressure of 3 mmHg.   Comparison(s): A prior study was performed on 01/24/2024. The right  ventricular systolic function  has improved. LV function and regional wall  motion abnormalities have resolved. Mild aortic regurgitation now present.    )         Anesthesia Quick Evaluation

## 2024-05-19 ENCOUNTER — Other Ambulatory Visit: Payer: Self-pay

## 2024-05-19 ENCOUNTER — Ambulatory Visit (HOSPITAL_COMMUNITY): Payer: Self-pay | Admitting: Physician Assistant

## 2024-05-19 ENCOUNTER — Encounter (HOSPITAL_COMMUNITY): Payer: Self-pay

## 2024-05-19 ENCOUNTER — Encounter (HOSPITAL_COMMUNITY): Admission: RE | Disposition: A | Discharge status: Home / Self Care | Payer: Self-pay | Source: Home / Self Care

## 2024-05-19 ENCOUNTER — Ambulatory Visit (HOSPITAL_COMMUNITY): Admission: RE | Admit: 2024-05-19 | Discharge: 2024-05-19 | Disposition: A | Discharge status: Home / Self Care

## 2024-05-19 ENCOUNTER — Ambulatory Visit (HOSPITAL_BASED_OUTPATIENT_CLINIC_OR_DEPARTMENT_OTHER): Payer: Self-pay | Admitting: Physician Assistant

## 2024-05-19 DIAGNOSIS — J387 Other diseases of larynx: Secondary | ICD-10-CM

## 2024-05-19 DIAGNOSIS — D649 Anemia, unspecified: Secondary | ICD-10-CM | POA: Diagnosis not present

## 2024-05-19 DIAGNOSIS — Z79899 Other long term (current) drug therapy: Secondary | ICD-10-CM | POA: Insufficient documentation

## 2024-05-19 DIAGNOSIS — I110 Hypertensive heart disease with heart failure: Secondary | ICD-10-CM

## 2024-05-19 DIAGNOSIS — J3801 Paralysis of vocal cords and larynx, unilateral: Secondary | ICD-10-CM | POA: Diagnosis not present

## 2024-05-19 DIAGNOSIS — E785 Hyperlipidemia, unspecified: Secondary | ICD-10-CM | POA: Diagnosis not present

## 2024-05-19 DIAGNOSIS — Z87891 Personal history of nicotine dependence: Secondary | ICD-10-CM

## 2024-05-19 DIAGNOSIS — I5022 Chronic systolic (congestive) heart failure: Secondary | ICD-10-CM | POA: Insufficient documentation

## 2024-05-19 DIAGNOSIS — I509 Heart failure, unspecified: Secondary | ICD-10-CM | POA: Diagnosis not present

## 2024-05-19 HISTORY — PX: MICROLARYNGOSCOPY W/VOCAL CORD INJECTION: SHX2665

## 2024-05-19 SURGERY — MICROLARYNGOSCOPY, WITH VOCAL CORD INJECTION
Anesthesia: General | Laterality: Bilateral

## 2024-05-19 MED ORDER — ROCURONIUM BROMIDE 10 MG/ML (PF) SYRINGE
PREFILLED_SYRINGE | INTRAVENOUS | Status: DC | PRN
  Administered 2024-05-19: 50 mg via INTRAVENOUS
  Administered 2024-05-19: 30 mg via INTRAVENOUS

## 2024-05-19 MED ORDER — LACTATED RINGERS IV SOLN: INTRAVENOUS | Status: DC

## 2024-05-19 MED ORDER — LIDOCAINE HCL 2 % IJ SOLN
INTRAMUSCULAR | Status: DC | PRN
  Administered 2024-05-19: 5 mL

## 2024-05-19 MED ORDER — ACETAMINOPHEN 500 MG PO TABS
1000.0000 mg | ORAL_TABLET | Freq: Once | ORAL | Status: AC
  Administered 2024-05-19: 1000 mg via ORAL
  Filled 2024-05-19: qty 2

## 2024-05-19 MED ORDER — LIDOCAINE 2% (20 MG/ML) 5 ML SYRINGE
INTRAMUSCULAR | Status: AC
  Filled 2024-05-19: qty 5

## 2024-05-19 MED ORDER — PROPOFOL 10 MG/ML IV BOLUS
INTRAVENOUS | Status: DC | PRN
  Administered 2024-05-19: 50 mg via INTRAVENOUS
  Administered 2024-05-19: 80 mg via INTRAVENOUS
  Administered 2024-05-19: 20 mg via INTRAVENOUS

## 2024-05-19 MED ORDER — OXYMETAZOLINE HCL 0.05 % NA SOLN
NASAL | Status: AC
  Filled 2024-05-19: qty 30

## 2024-05-19 MED ORDER — FENTANYL CITRATE (PF) 100 MCG/2ML IJ SOLN
25.0000 ug | INTRAMUSCULAR | Status: DC | PRN
  Administered 2024-05-19 (×2): 50 ug via INTRAVENOUS

## 2024-05-19 MED ORDER — ROCURONIUM BROMIDE 10 MG/ML (PF) SYRINGE
PREFILLED_SYRINGE | INTRAVENOUS | Status: AC
  Filled 2024-05-19: qty 10

## 2024-05-19 MED ORDER — FENTANYL CITRATE (PF) 250 MCG/5ML IJ SOLN
INTRAMUSCULAR | Status: DC | PRN
  Administered 2024-05-19 (×2): 50 ug via INTRAVENOUS

## 2024-05-19 MED ORDER — 0.9 % SODIUM CHLORIDE (POUR BTL) OPTIME
TOPICAL | Status: DC | PRN
  Administered 2024-05-19: 1000 mL

## 2024-05-19 MED ORDER — FENTANYL CITRATE (PF) 100 MCG/2ML IJ SOLN
INTRAMUSCULAR | Status: AC
  Filled 2024-05-19: qty 2

## 2024-05-19 MED ORDER — CHLORHEXIDINE GLUCONATE 0.12 % MT SOLN
OROMUCOSAL | Status: AC
  Administered 2024-05-19: 15 mL via OROMUCOSAL
  Filled 2024-05-19: qty 15

## 2024-05-19 MED ORDER — DEXAMETHASONE SOD PHOSPHATE PF 10 MG/ML IJ SOLN
INTRAMUSCULAR | Status: DC | PRN
  Administered 2024-05-19: 4 mg via INTRAVENOUS

## 2024-05-19 MED ORDER — ONDANSETRON HCL 4 MG/2ML IJ SOLN
INTRAMUSCULAR | Status: DC | PRN
  Administered 2024-05-19: 4 mg via INTRAVENOUS

## 2024-05-19 MED ORDER — CHLORHEXIDINE GLUCONATE 0.12 % MT SOLN: 15.0000 mL | Freq: Once | OROMUCOSAL | Status: AC

## 2024-05-19 MED ORDER — DROPERIDOL 2.5 MG/ML IJ SOLN: 0.6250 mg | Freq: Once | INTRAMUSCULAR | Status: DC | PRN

## 2024-05-19 MED ORDER — EPINEPHRINE PF 1 MG/ML IJ SOLN
INTRAMUSCULAR | Status: DC | PRN
  Administered 2024-05-19: 1 mg

## 2024-05-19 MED ORDER — EPINEPHRINE PF 1 MG/ML IJ SOLN
INTRAMUSCULAR | Status: AC
  Filled 2024-05-19: qty 1

## 2024-05-19 MED ORDER — ONDANSETRON HCL 4 MG/2ML IJ SOLN
INTRAMUSCULAR | Status: AC
  Filled 2024-05-19: qty 2

## 2024-05-19 MED ORDER — SUGAMMADEX SODIUM 200 MG/2ML IV SOLN
INTRAVENOUS | Status: DC | PRN
  Administered 2024-05-19: 200 mg via INTRAVENOUS

## 2024-05-19 MED ORDER — ORAL CARE MOUTH RINSE: 15.0000 mL | Freq: Once | OROMUCOSAL | Status: AC

## 2024-05-19 MED ORDER — LIDOCAINE 2% (20 MG/ML) 5 ML SYRINGE
INTRAMUSCULAR | Status: DC | PRN
  Administered 2024-05-19: 100 mg via INTRAVENOUS

## 2024-05-19 MED ORDER — PROPOFOL 10 MG/ML IV BOLUS
INTRAVENOUS | Status: AC
  Filled 2024-05-19: qty 20

## 2024-05-19 SURGICAL SUPPLY — 32 items
BAG COUNTER SPONGE SURGICOUNT (BAG) ×2 IMPLANT
BAG DECANTER FOR FLEXI CONT (MISCELLANEOUS) ×2 IMPLANT
BALLN PULMONARY 10-12 (MISCELLANEOUS) IMPLANT
BALLOON PULM 12 13.5 15X75 (BALLOONS) IMPLANT
BALLOON PULM 15 16.5 18X75 (BALLOONS) IMPLANT
BLADE SURG 15 STRL LF DISP TIS (BLADE) IMPLANT
BNDG EYE OVAL 2 1/8 X 2 5/8 (GAUZE/BANDAGES/DRESSINGS) ×4 IMPLANT
CANISTER SUCTION 3000ML PPV (SUCTIONS) ×2 IMPLANT
CNTNR URN SCR LID CUP LEK RST (MISCELLANEOUS) IMPLANT
COVER BACK TABLE 60X90IN (DRAPES) ×2 IMPLANT
COVER MAYO STAND STRL (DRAPES) ×2 IMPLANT
DRAPE HALF SHEET 40X57 (DRAPES) ×2 IMPLANT
GAUZE SPONGE 4X4 12PLY STRL (GAUZE/BANDAGES/DRESSINGS) ×2 IMPLANT
GLOVE BIO SURGEON STRL SZ7 (GLOVE) ×2 IMPLANT
GOWN STRL REUS W/ TWL LRG LVL3 (GOWN DISPOSABLE) IMPLANT
KIT BASIN OR (CUSTOM PROCEDURE TRAY) ×2 IMPLANT
KIT PROLARN PLUS GEL W/NDL (Prosthesis and Implant ENT) IMPLANT
KIT TURNOVER KIT B (KITS) ×2 IMPLANT
NDL FILTER BLUNT 18X1 1/2 (NEEDLE) ×2 IMPLANT
NDL HYPO 25GX1X1/2 BEV (NEEDLE) IMPLANT
PAD ARMBOARD POSITIONER FOAM (MISCELLANEOUS) ×4 IMPLANT
PATTIES SURGICAL .5X1.5 (GAUZE/BANDAGES/DRESSINGS) ×2 IMPLANT
POSITIONER HEAD DONUT 9IN (MISCELLANEOUS) IMPLANT
SET COLLECT BLD 25X3/4 12 (NEEDLE) ×2 IMPLANT
SOLN 0.9% NACL POUR BTL 1000ML (IV SOLUTION) ×2 IMPLANT
SOLN STERILE WATER BTL 1000 ML (IV SOLUTION) ×2 IMPLANT
SOLUTION ANTFG W/FOAM PAD STRL (MISCELLANEOUS) ×2 IMPLANT
SURGILUBE 2OZ TUBE FLIPTOP (MISCELLANEOUS) IMPLANT
SUT SILK 2 0 PERMA HAND 18 BK (SUTURE) IMPLANT
SYR 3ML LL SCALE MARK (SYRINGE) ×2 IMPLANT
TOWEL GREEN STERILE FF (TOWEL DISPOSABLE) ×2 IMPLANT
TUBE CONNECTING 12X1/4 (SUCTIONS) ×2 IMPLANT

## 2024-05-19 NOTE — Op Note (Signed)
 Date of Surgery: 05/19/24 Surgeon: Adah JONELLE Malkin, DO Type of Anesthesia: General Endotracheal Preoperative Diagnosis: left vocal cord paralysis, right vocal cord atrophy Postoperative Diagnosis: same Procedure(s):  Microlaryngoscopy with injection of bilateral vocal cords with Prolaryn gel Indications for Procedure:  83 year old male with left vocal cord paralysis and bilateral vocal cord atrophy who has had injection of vocal cords in the past with improvement in voice and swallow. Patient has recently started to lose his voice again and requires repeat injection. Patient has elected to undergo procedure in the OR. All risks, benefits, and alternatives discussed with patient prior to procedure.  Estimated Blood Loss:  none Significant Events: No significant adverse events Findings:  - bilateral vocal fold atrophy - no masses, lesions, or ulcerations   Procedure Details:    Patient was brought back to operative suite at which an official time out was performed indicating the correct patient and procedure. The patient was placed in supine position and intubated by the department of anesthesia. The patient was then turned toward the surgeon. Blue OR towel was wrapped around the patient's face to protect the eyes. A mouth guard was placed in the mouth to protect the teeth  Next laryngoscopy was performed. A dedo laryngoscope was illuminated and then placed in the oral airway and suspended at the level of the larynx. Vocal cords were visualized which appeared to be atrophic without any mass, ulceration or lesion. Prolaryn gel was injected in bilatearl vocal cords until some over-medialization was seen. Next, the false cords were visualized, no mass , ulceration or lesion. The aryeoepiglottic folds contained no mass ulceration or lesion. The arytenoids were visualized which did not demonstrate a mass, ulceration or lesion. The pyriform sinuses were probed bilaterally which contained no mass ulceration  or lesion. The epiglottis and base of tongue was visualized with no mass or lesion. The tonsils were assessed which were normal. A bimanual exam was conducted in which no masses were palpated. All instrumentation was removed from the patient, including mouth guard.    Disposition: awakened from anesthesia, extubated and taken to the recovery room in a stable condition, having suffered no apparent untoward event.   Adah Malkin, DO [facility]

## 2024-05-19 NOTE — Anesthesia Procedure Notes (Signed)
 Procedure Name: Intubation Date/Time: 05/19/2024 2:49 PM  Performed by: Lockie Flesher, CRNAPre-anesthesia Checklist: Patient identified, Emergency Drugs available, Suction available and Patient being monitored Patient Re-evaluated:Patient Re-evaluated prior to induction Oxygen Delivery Method: Circle System Utilized Preoxygenation: Pre-oxygenation with 100% oxygen Induction Type: IV induction Ventilation: Mask ventilation without difficulty Laryngoscope Size: Mac and 3 Grade View: Grade I Tube type: Oral Tube size: 6.0 mm Number of attempts: 1 Airway Equipment and Method: Stylet and Oral airway Placement Confirmation: ETT inserted through vocal cords under direct vision, positive ETCO2 and breath sounds checked- equal and bilateral Secured at: 25 cm Tube secured with: Tape Dental Injury: Teeth and Oropharynx as per pre-operative assessment

## 2024-05-19 NOTE — H&P (Signed)
 Chad Moreno is an 83 y.o. male.    Chief Complaint:  vocal cord paralysis  HPI: Patient presents today for planned elective procedure.  He denies any interval change in history since office visit on 05/11/2024.  Past Medical History:  Diagnosis Date   Acute hypoxemic respiratory failure (HCC) 01/23/2024   Acute systolic CHF (congestive heart failure) (HCC) 01/24/2024   Age-related cataract of left eye    Amblyopia, right eye    Arthritis    Bacteremia due to Escherichia coli 02/01/2024   Cancer (HCC)    prostate, lungs   CAP (community acquired pneumonia) 01/23/2024   Dysuria    ED (erectile dysfunction)    Elevated PSA    Epidermal cyst    Family history of prostate cancer    Gout    Hip pain    Histoplasmosis    Hyperlipidemia    Hypermetropia, left eye    Hypertension    Hypokalemia 01/25/2024   Impaired glucose tolerance    Joint pain    Left shoulder pain    Lower back pain    Melanocytic nevi, unspecified    Nummular dermatitis    Ocular hypertension    Palmar fascial fibromatosis    Prostate cancer screening    Regular astigmatism, right eye    Senile nuclear sclerosis    SIRS (systemic inflammatory response syndrome) (HCC) 01/23/2024    Past Surgical History:  Procedure Laterality Date   BRONCHIAL BIOPSY  08/27/2022   Procedure: BRONCHIAL BIOPSIES;  Surgeon: Shelah Lamar RAMAN, MD;  Location: [facility];  Service: Pulmonary;;   BRONCHIAL BRUSHINGS  08/27/2022   Procedure: BRONCHIAL BRUSHINGS;  Surgeon: Shelah Lamar RAMAN, MD;  Location: [facility];  Service: Pulmonary;;   BRONCHIAL NEEDLE ASPIRATION BIOPSY  08/27/2022   Procedure: BRONCHIAL NEEDLE ASPIRATION BIOPSIES;  Surgeon: Shelah Lamar RAMAN, MD;  Location: [facility];  Service: Pulmonary;;   CONSTRICTING FINGER RING EXCISION Left    CYST REMOVAL TRUNK Left    left posterior shoulder   FIDUCIAL MARKER PLACEMENT  08/27/2022   Procedure: FIDUCIAL MARKER PLACEMENT;  Surgeon: Shelah Lamar RAMAN, MD;  Location: [facility];  Service: Pulmonary;;   GOLD SEED IMPLANT N/A 10/02/2022   Procedure: GOLD SEED IMPLANT;  Surgeon: Lovie Arlyss CROME, MD;  Location: [facility];  Service: Urology;  Laterality: N/A;   index finger Left    IR FACET JT INJ C/T  2ND LEVEL RIGHT W/FL/CT  01/20/2024   IR FACET JT INJ C/T  SINGLE LEVEL RIGHT W/FL/CT  01/20/2024   IR FACET JT INJ C/T 3RD PLUS LEVEL RIGHT W/FL/CT  01/20/2024   IR RADIOLOGIST EVAL & MGMT  02/19/2024   IR THORACENTESIS ASP PLEURAL SPACE W/IMG GUIDE  06/14/2023   Prostate needle biopsy     SPACE OAR INSTILLATION N/A 10/02/2022   Procedure: SPACE OAR INSTILLATION;  Surgeon: Lovie Arlyss CROME, MD;  Location: [facility];  Service: Urology;  Laterality: N/A;    Family History  Problem Relation Age of Onset   Prostate cancer Father     Social History:  reports that he quit smoking about 44 years ago. His smoking use included cigarettes. He started smoking about 54 years ago. He has a 5 pack-year smoking history. He has never used smokeless tobacco. He reports current alcohol  use of about 3.0 standard drinks of alcohol  per week. He reports that he does not use drugs.  Allergies:  Allergies  Allergen Reactions   Sulfa Antibiotics Anaphylaxis  Ciprofloxacin     Other Reaction(s): did not feel well   Ciprofloxacin Hcl     Other Reaction(s): did not feel well    Medications Prior to Admission  Medication Sig Dispense Refill   ascorbic acid  (VITAMIN C ) 500 MG tablet Take 1 tablet (500 mg total) by mouth daily.     calcium carbonate (OSCAL) 1500 (600 Ca) MG TABS tablet Take 600 mg of elemental calcium by mouth daily with breakfast.     fluticasone (FLONASE) 50 MCG/ACT nasal spray Place 2 sprays into both nostrils daily as needed for allergies.     furosemide  (LASIX ) 20 MG tablet Take 1 tablet (20 mg total) by mouth daily as needed. For weight gain or swelling 30 tablet 11   loperamide  (IMODIUM  A-D) 2 MG tablet Take 1 tablet (2 mg  total) by mouth 4 (four) times daily as needed for diarrhea or loose stools. (Patient taking differently: Take 2 mg by mouth daily as needed for diarrhea or loose stools.) 30 tablet 0   losartan  (COZAAR ) 25 MG tablet Take 1 tablet (25 mg total) by mouth daily. 90 tablet 2   ondansetron  (ZOFRAN -ODT) 4 MG disintegrating tablet Take 1 tablet (4 mg total) by mouth every 8 (eight) hours as needed for nausea or vomiting. 20 tablet 0   pantoprazole  (PROTONIX ) 40 MG tablet Take 1 tablet (40 mg total) by mouth 2 (two) times daily. (Patient taking differently: Take 40 mg by mouth daily.) 60 tablet 0   POTASSIUM PO Take 1 tablet by mouth daily at 12 noon.     Probiotic Product (PROBIOTIC PEARLS ADVANTAGE PO) Take 1 capsule by mouth in the morning.     protein supplement shake (PREMIER PROTEIN) LIQD Take 11 oz by mouth daily at 12 noon.     silodosin  (RAPAFLO ) 8 MG CAPS capsule Take 8 mg by mouth daily.     binimetinib  (MEKTOVI ) 15 MG tablet Take 3 tablets (45 mg total) by mouth 2 (two) times daily. 180 tablet 3   celecoxib  (CELEBREX ) 200 MG capsule TAKE 1 CAPSULE BY MOUTH 2 TIMES A DAY 60 capsule 3   encorafenib  (BRAFTOVI ) 75 MG capsule Take 6 capsules (450 mg total) by mouth daily. 180 capsule 3    No results found for this or any previous visit (from the past 48 hours). No results found.  ROS: ROS  Pulse 75, temperature 97.6 F (36.4 C), temperature source Oral, resp. rate 17, height 6' 1 (1.854 m), weight 86.6 kg, SpO2 98%.    Assessment/Plan Proceed to OR for microlaryngoscopy with bilateral vocal fold injection    Shawniece Oyola R Naydelin Ziegler 05/19/2024, 11:34 AM

## 2024-05-19 NOTE — Progress Notes (Deleted)
 Cardiology Office Note:  .   Date:  05/19/2024  ID:  Chad Moreno, DOB 07/26/1940, MRN [id number] PCP: [name], MD  [facility] Providers Cardiologist:  [name], MD Cardiology APP:  [name], NP { Click to update primary MD,subspecialty MD or APP then REFRESH:1}   History of Present Illness: .   Chad Moreno is a 83 y.o. male history of metastatic nSCLC currently on immunotherapy, chronic thrombocytopenia, histoplasmosis, hypertension, hyperlipidemia, systolic heart failure   Echocardiogram 06/2023 showed LVEF 55% with no RWMA. s/p SRB [place] completed 10/2022; onencorafenib and binimetinib  started on 06/2023.   Admitted 12/30/2023 for SBO/GI bleed.  Conservative management was recommended per general surgery.  GI bleed felt to be due to cecal colitis/diverticulosis.  Received PRBCs and IV PPI.  Plan for outpatient GI EGD/colonoscopy.  Course complicated by ESBL UTI treated with antibiotics.  He was discharged home in stable condition   He was readmitted on 01/17/2024 with dyspnea initially and required BiPAP therapy.  Echocardiogram showed LVEF of 30%, RWMA's, mild LVH, RV mildly reduced.  Questionable Takotsubo cardiomyopathy versus LAD infarct versus cardiomyopathy from immunotherapy.  At immunotherapy was held.  Cardiology was consulted.  His high sensitive troponins were mildly elevated but flat and EKG was without acute ST changes.  Blood cultures were positive for a ESBL E. coli and ID was consulted.  He was started on antibiotics and diuresed with IV Lasix  and GDMT was titrated.  Patient wishes not to have invasive procedures.  GDMT was limited by low BP and orthostasis.  He was discharged home with a weight of 188 LBS.   He followed up with oncology on 01/28/2024 and encorafenib  and binimetinib  restarted at reduced doses.    He was last seen for follow-up with heart failure clinic on 02/05/2024.  No shortness of breath with activity or ADLs.  Weight at home  was stable, has not needed Lasix .  Has home health PT and home health RN.  He is without any chest pain and not interested in invasive workup.  Cardiac MRI and ischemic workup were deferred.  He was restarted on losartan  25 mg daily (did experience dizziness on Entresto ).  SGLT2i was avoided given recent UTIs, bacteremia, and sacral wound.  He is likely not ICD candidate with age and recent bacteremia should EF not improved.      ROS: ***  Studies Reviewed: SABRA         Prior CV Studies: {Select studies to display:26339}  ***  Risk Assessment/Calculations:   {Does this patient have ATRIAL FIBRILLATION?:[id number]} No BP recorded.  {Refresh Note OR Click here to enter BP  :1}***       Physical Exam:   VS:  There were no vitals taken for this visit.   Orhtostatics: No data found. Wt Readings from Last 3 Encounters:  05/14/24 190 lb (86.2 kg)  05/11/24 190 lb 14.4 oz (86.6 kg)  04/16/24 196 lb (88.9 kg)    GEN: Well nourished, well developed in no acute distress NECK: No JVD; No carotid bruits CARDIAC: ***RRR, no murmurs, rubs, gallops RESPIRATORY:  Clear to auscultation without rales, wheezing or rhonchi  ABDOMEN: Soft, non-tender, non-distended EXTREMITIES:  No edema; No deformity   ASSESSMENT AND PLAN: .    Chronic systolic heart failure Diagnosed 12/2023 Echocardiogram 06/2023 with LVEF 50 to 55% Started targeted immunotherapy with encorafenib  and binimetinib  06/2023 Echocardiogram 12/2023 with LVEF 30%, RWMA's - He is not interested in invasive workup.  Deferred cMRI and  catheterization. Agreed to medication management - Possible stress-induced cardiomyopathy (d/x with ESBL E. coli bacteremia at time of admission) versus immunotherapy cardiomyopathy versus ischemia - Notes he has had vast improvement in symptoms since his immunotherapy drugs were decreased.  Notes only symptom he experienced was shortness of breath shortly after starting immunotherapy which has resolved - NYHA  class I-II and not requiring daily loop diuretic therapy - Today patient appears euvolemic and well compensated on exam.  No dyspnea, orthopnea, PND, leg swelling.  Weight remains stable - Remains active with daily exercises including walking, band exercises, and leg lifts without exertional symptoms or limitation - Given he has had much improvement we will plan to repeat echocardiogram on 11/1 to reassess his LV function - I will hold off on further GDMT titration today given history of orthostatic hypotension - During recent admission spironolactone  was discontinued in the setting of orthostatic hypotension and Entresto  in the setting of dizziness - Avoid SGLT2i with recent UTIs, bacteremia and sacral wound  - Continue Lasix  20 mg daily as needed and losartan  25 mg daily - Not an advanced therapies candidate   Metastatic lung adenocarcinoma Initially diagnosed 08/2023 Currently on reduced dose of encorafenib  and binimetinib   - Management per oncology  Echocardiogram 01/24/2024 1. Left ventricular ejection fraction, by estimation, is 30 to 35%. The  left ventricle has moderately decreased function. The left ventricle  demonstrates regional wall motion abnormalities (see scoring  diagram/findings for description). There is mild  left ventricular hypertrophy. Left ventricular diastolic parameters are  indeterminate.   2. Right ventricular systolic function is mildly reduced. The right  ventricular size is normal. Normal RV basal function with apical  hypokinesis   3. Moderate pleural effusion in the left lateral region.   4. The mitral valve was not well visualized. Trivial mitral valve  regurgitation.   5. The aortic valve was not well visualized. Aortic valve regurgitation  is not visualized. No aortic stenosis is present.   6. The inferior vena cava is normal in size with greater than 50%  respiratory variability, suggesting right atrial pressure of 3 mmHg.    Limited echocardiogram  07/19/2023 1. Left ventricular ejection fraction, by estimation, is 55%. The left  ventricle has normal function. The left ventricle has no regional wall  motion abnormalities. Left ventricular diastolic parameters are consistent  with Grade I diastolic dysfunction  (impaired relaxation).   2. Right ventricular systolic function is normal. The right ventricular  size is normal. Tricuspid regurgitation signal is inadequate for assessing  PA pressure.   3. The aortic valve is tricuspid. There is mild calcification of the  aortic valve. Aortic valve regurgitation is trivial. No aortic stenosis is  present.   4. The inferior vena cava is normal in size with greater than 50%  respiratory variability, suggesting right atrial pressure of 3 mmHg.   5. IVC not visualized.   6. Limited echo.      {Are you ordering a CV Procedure (e.g. stress test, cath, DCCV, TEE, etc)?   Press F2        :[id number]}  Dispo: ***  Signed, Olivia Pavy, PA-C

## 2024-05-19 NOTE — Anesthesia Postprocedure Evaluation (Signed)
 Anesthesia Post Note  Patient: Chad Moreno  Procedure(s) Performed: MICROLARYNGOSCOPY, WITH VOCAL CORD INJECTION (Bilateral)     Patient location during evaluation: PACU Anesthesia Type: General Level of consciousness: awake Pain management: pain level controlled Vital Signs Assessment: post-procedure vital signs reviewed and stable Respiratory status: spontaneous breathing, nonlabored ventilation and respiratory function stable Cardiovascular status: blood pressure returned to baseline and stable Postop Assessment: no apparent nausea or vomiting Anesthetic complications: no   No notable events documented.  Last Vitals:  Vitals:   05/19/24 1545 05/19/24 1555  BP: (!) 162/74   Pulse: 71 71  Resp: 14 13  Temp:    SpO2: 94% 93%    Last Pain:  Vitals:   05/19/24 1600  TempSrc:   PainSc: (P) 7                  Delon Aisha Arch

## 2024-05-19 NOTE — Progress Notes (Signed)
 Dr. Lender notified of elevated blood pressure. Stated he would be in to see pt.

## 2024-05-19 NOTE — Transfer of Care (Signed)
 Immediate Anesthesia Transfer of Care Note  Patient: Chad Moreno  Procedure(s) Performed: MICROLARYNGOSCOPY, WITH VOCAL CORD INJECTION (Bilateral)  Patient Location: PACU  Anesthesia Type:General  Level of Consciousness: awake, alert , and oriented  Airway & Oxygen Therapy: Patient Spontanous Breathing and Patient connected to nasal cannula oxygen  Post-op Assessment: Report given to RN and Post -op Vital signs reviewed and stable  Post vital signs: Reviewed and stable  Last Vitals:  Vitals Value Taken Time  BP    Temp    Pulse 73 05/19/24 15:32  Resp 13 05/19/24 15:32  SpO2 92 % 05/19/24 15:32  Vitals shown include unfiled device data.  Last Pain:  Vitals:   05/19/24 1210  TempSrc:   PainSc: 0-No pain         Complications: No notable events documented.

## 2024-05-20 ENCOUNTER — Encounter (HOSPITAL_COMMUNITY): Payer: Self-pay

## 2024-05-22 ENCOUNTER — Telehealth (INDEPENDENT_AMBULATORY_CARE_PROVIDER_SITE_OTHER): Payer: Self-pay

## 2024-05-22 NOTE — Telephone Encounter (Signed)
 [name] LVM that pt is having bruising and a rash on opposite arm from his IV from surgery. I spoke with Dr. Roark and informed her that per Dr. Roark, this does not sound like it is surgery related. They need to reach out to PCP or Urgent Care if worsen or is bothersome. She understood.

## 2024-06-01 ENCOUNTER — Encounter (INDEPENDENT_AMBULATORY_CARE_PROVIDER_SITE_OTHER): Payer: Self-pay

## 2024-06-01 ENCOUNTER — Ambulatory Visit (INDEPENDENT_AMBULATORY_CARE_PROVIDER_SITE_OTHER)

## 2024-06-01 VITALS — BP 129/75 | HR 71

## 2024-06-01 DIAGNOSIS — J3801 Paralysis of vocal cords and larynx, unilateral: Secondary | ICD-10-CM | POA: Diagnosis not present

## 2024-06-01 DIAGNOSIS — J383 Other diseases of vocal cords: Secondary | ICD-10-CM

## 2024-06-01 DIAGNOSIS — R490 Dysphonia: Secondary | ICD-10-CM

## 2024-06-01 NOTE — Progress Notes (Signed)
 Dear Dr. Janey, Here is my assessment for our mutual patient, Chad Moreno. Thank you for allowing me the opportunity to care for your patient. Please do not hesitate to contact me should you have any other questions. Sincerely, Dr. Hadassah Moreno  Otolaryngology Clinic Note Referring provider: Dr. Janey HPI:   (06/01/2024) Chad Moreno is a 83 y.o. male presenting post-operatively after vocal fold injection in OR with Dr. Mila.   Overall feels better after surgery but still feels like voice goes to a whisper at times. Generally feels stronger than prior.  No longer gasping for breath when speaking.Cough less prevalent since recent vocal cord injection. No trouble with swallowing.    Independent Review of Additional Tests or Records:  Note by Adah Mila, DO (04/17/24): discussed possible more permanent solution if needed.   Op note by Adah Mila, DO (05/19/24): bilateral VF injection with prolaryn    PMH/Meds/All/SocHx/FamHx/ROS:   Past Medical History:  Diagnosis Date   Acute hypoxemic respiratory failure (HCC) 01/23/2024   Acute systolic CHF (congestive heart failure) (HCC) 01/24/2024   Age-related cataract of left eye    Amblyopia, right eye    Arthritis    Bacteremia due to Escherichia coli 02/01/2024   Cancer (HCC)    prostate, lungs   CAP (community acquired pneumonia) 01/23/2024   Dysuria    ED (erectile dysfunction)    Elevated PSA    Epidermal cyst    Family history of prostate cancer    Gout    Hip pain    Histoplasmosis    Hyperlipidemia    Hypermetropia, left eye    Hypertension    Hypokalemia 01/25/2024   Impaired glucose tolerance    Joint pain    Left shoulder pain    Lower back pain    Melanocytic nevi, unspecified    Nummular dermatitis    Ocular hypertension    Palmar fascial fibromatosis    Prostate cancer screening    Regular astigmatism, right eye    Senile nuclear sclerosis    SIRS (systemic inflammatory response syndrome) (HCC)  01/23/2024     Past Surgical History:  Procedure Laterality Date   BRONCHIAL BIOPSY  08/27/2022   Procedure: BRONCHIAL BIOPSIES;  Surgeon: Shelah Lamar RAMAN, MD;  Location: [facility];  Service: Pulmonary;;   BRONCHIAL BRUSHINGS  08/27/2022   Procedure: BRONCHIAL BRUSHINGS;  Surgeon: Shelah Lamar RAMAN, MD;  Location: [facility];  Service: Pulmonary;;   BRONCHIAL NEEDLE ASPIRATION BIOPSY  08/27/2022   Procedure: BRONCHIAL NEEDLE ASPIRATION BIOPSIES;  Surgeon: Shelah Lamar RAMAN, MD;  Location: [facility];  Service: Pulmonary;;   CONSTRICTING FINGER RING EXCISION Left    CYST REMOVAL TRUNK Left    left posterior shoulder   FIDUCIAL MARKER PLACEMENT  08/27/2022   Procedure: FIDUCIAL MARKER PLACEMENT;  Surgeon: Shelah Lamar RAMAN, MD;  Location: [facility];  Service: Pulmonary;;   GOLD SEED IMPLANT N/A 10/02/2022   Procedure: GOLD SEED IMPLANT;  Surgeon: Lovie Arlyss CROME, MD;  Location: [facility];  Service: Urology;  Laterality: N/A;   index finger Left    IR FACET JT INJ C/T  2ND LEVEL RIGHT W/FL/CT  01/20/2024   IR FACET JT INJ C/T  SINGLE LEVEL RIGHT W/FL/CT  01/20/2024   IR FACET JT INJ C/T 3RD PLUS LEVEL RIGHT W/FL/CT  01/20/2024   IR RADIOLOGIST EVAL & MGMT  02/19/2024   IR THORACENTESIS ASP PLEURAL SPACE W/IMG GUIDE  06/14/2023   MICROLARYNGOSCOPY W/VOCAL CORD INJECTION Bilateral 05/19/2024   Procedure:  MICROLARYNGOSCOPY, WITH VOCAL CORD INJECTION;  Surgeon: Mila Adah SAUNDERS, DO;  Location: [facility];  Service: ENT;  Laterality: Bilateral;   Prostate needle biopsy     SPACE OAR INSTILLATION N/A 10/02/2022   Procedure: SPACE OAR INSTILLATION;  Surgeon: Lovie Arlyss CROME, MD;  Location: [facility];  Service: Urology;  Laterality: N/A;    Family History  Problem Relation Age of Onset   Prostate cancer Father      Social Connections: Socially Integrated (01/23/2024)   Social Connection and Isolation Panel    Frequency of Communication with Friends and Family: More than  three times a week    Frequency of Social Gatherings with Friends and Family: Once a week    Attends Religious Services: More than 4 times per year    Active Member of [employer] or Organizations: Yes    Attends Engineer, Structural: More than 4 times per year    Marital Status: Married     Current Outpatient Medications  Medication Instructions   ascorbic acid  (VITAMIN C ) 500 mg, Oral, Daily   binimetinib  (MEKTOVI ) 45 mg, Oral, 2 times daily   calcium carbonate (OSCAL) 1500 (600 Ca) MG TABS tablet 600 mg of elemental calcium, Daily with breakfast   celecoxib  (CELEBREX ) 200 mg, Oral, 2 times daily   encorafenib  (BRAFTOVI ) 450 mg, Oral, Daily   fluticasone (FLONASE) 50 MCG/ACT nasal spray 2 sprays, Daily PRN   furosemide  (LASIX ) 20 mg, Oral, Daily PRN, For weight gain or swelling   loperamide  (IMODIUM  A-D) 2 mg, Oral, 4 times daily PRN   losartan  (COZAAR ) 25 mg, Oral, Daily   ondansetron  (ZOFRAN -ODT) 4 mg, Oral, Every 8 hours PRN   pantoprazole  (PROTONIX ) 40 mg, Oral, 2 times daily   POTASSIUM PO 1 tablet, Daily   Probiotic Product (PROBIOTIC PEARLS ADVANTAGE PO) 1 capsule, Every morning   protein supplement shake (PREMIER PROTEIN) LIQD 11 oz, Daily   silodosin  (RAPAFLO ) 8 mg, Daily     Physical Exam:   BP 129/75 (BP Location: Left Arm, Patient Position: Sitting)   Pulse 71   SpO2 95%   Salient findings:  CN II-XII intact Bilateral EAC clear and TM intact with well pneumatized middle ear spaces Dysphonic slightly breathy  No lesions of oral cavity/oropharynx No obviously palpable neck masses/lymphadenopathy/thyromegaly No respiratory distress or stridor TFL was indicated to better evaluate the proximal airway, given the patient's history and exam findings, and is detailed below.  Seprately Identifiable Procedures:  Prior to initiating any procedures, risks/benefits/alternatives were explained to the patient and verbal consent obtained.  Procedure Note  (06/01/2024) Pre-procedure diagnosis:  Left vocal cord paralysis, age related VF atrophy  Post-procedure diagnosis: Same Procedure: Transnasal Fiberoptic Laryngoscopy, CPT [phone] - Mod 25 Indication: Left vocal cord paralysis, age related VF atrophy  Complications: None apparent EBL: 0 mL  The procedure was undertaken to further evaluate the patient's complaint of Left vocal cord paralysis, age related VF atrophy , with mirror exam inadequate for appropriate examination due to gag reflex and poor patient tolerance  Procedure:  Patient was identified as correct patient. Verbal consent was obtained. The nose was sprayed with oxymetazoline  and 4% lidocaine . The The flexible laryngoscope was passed through the nose to view the nasal cavity, pharynx (oropharynx, hypopharynx) and larynx.  The larynx was examined at rest and during multiple phonatory tasks. Documentation was obtained and reviewed with patient. The scope was removed. The patient tolerated the procedure well.  Findings: The nasal cavity and nasopharynx did not  reveal any masses or lesions, mucosa appeared to be without obvious lesions. The tongue base, pharyngeal walls, piriform sinuses, vallecula, epiglottis and postcricoid region are normal in appearance EXCEPT: Left vocal cord paralysis. Right cord meeting left but there remains small 1mm glottic gap. The visualized portion of the subglottis and proximal trachea is widely patent. The vocal folds are mobile bilaterally. There are no lesions on the free edge of the vocal folds nor elsewhere in the larynx worrisome for malignancy.    Electronically signed by: Chad JAYSON Parody, MD 06/01/2024 12:43 PM   Impression & Plans:  Calbert Hulsebus is a 83 y.o. male with   1. Paralysis of left vocal fold   2. Vocal fold atrophy   3. Glottic insufficiency   4. Dysphonia    Assessment and Plan Assessment & Plan Left vocal cord paralysis Glottic insufficiency Vocal fold atrophy  Chronic left  vocal cord paralysis secondary to lung cancer, persisting for over a year. Recent injection therapy has improved voice quality, though not fully resolved. Persistent cough due to lung cancer complicates injection procedures in office. Discussed potential for permanent surgical solutions, if current treatment fails. He is considering future surgical options if current treatment becomes ineffective. - Continue to monitor voice quality and symptoms. - Will consider referral to [facility] for permanent surgical options if voice quality deteriorates. - Scheduled follow-up with Doctor Dharap in six months. He will call if he wants referral to [facility] for consultation   Lung cancer - continue f/u with oncology    See below regarding exact medications prescribed this encounter including dosages and route: No orders of the defined types were placed in this encounter.   Thank you for allowing me the opportunity to care for your patient. Please do not hesitate to contact me should you have any other questions.  Sincerely, Chad Parody, MD Otolaryngologist (ENT), [facility] Phone: [phone] Fax: [phone]  MDM:  Level 3 Complexity/Problems addressed: chronic problem Data complexity: 3- independent review of 2 notes - Morbidity: low  - Prescription Drug prescribed or managed: no

## 2024-06-02 ENCOUNTER — Ambulatory Visit: Admitting: Physician Assistant

## 2024-06-04 ENCOUNTER — Other Ambulatory Visit: Payer: Self-pay

## 2024-06-04 NOTE — Progress Notes (Signed)
 Specialty Pharmacy Refill Coordination Note  Chad Moreno is a 83 y.o. male contacted today regarding refills of specialty medication(s) Binimetinib  (MEKTOVI ); Encorafenib  (BRAFTOVI )   Patient requested Delivery   Delivery date: 06/16/24   Verified address: [address] [place] [address]   Medication will be filled on: 06/15/24

## 2024-06-16 ENCOUNTER — Ambulatory Visit (HOSPITAL_COMMUNITY)
Admission: RE | Admit: 2024-06-16 | Discharge: 2024-06-16 | Disposition: A | Discharge status: Home / Self Care | Source: Ambulatory Visit | Attending: Internal Medicine | Admitting: Internal Medicine

## 2024-06-16 ENCOUNTER — Inpatient Hospital Stay: Attending: Internal Medicine

## 2024-06-16 DIAGNOSIS — C3490 Malignant neoplasm of unspecified part of unspecified bronchus or lung: Secondary | ICD-10-CM | POA: Insufficient documentation

## 2024-06-16 DIAGNOSIS — Z87891 Personal history of nicotine dependence: Secondary | ICD-10-CM | POA: Insufficient documentation

## 2024-06-16 DIAGNOSIS — F1090 Alcohol use, unspecified, uncomplicated: Secondary | ICD-10-CM | POA: Insufficient documentation

## 2024-06-16 DIAGNOSIS — C3431 Malignant neoplasm of lower lobe, right bronchus or lung: Secondary | ICD-10-CM | POA: Diagnosis present

## 2024-06-16 DIAGNOSIS — Z66 Do not resuscitate: Secondary | ICD-10-CM | POA: Diagnosis not present

## 2024-06-16 DIAGNOSIS — C7951 Secondary malignant neoplasm of bone: Secondary | ICD-10-CM | POA: Insufficient documentation

## 2024-06-16 LAB — CMP (CANCER CENTER ONLY)
ALT: 12 U/L (ref 0–44)
AST: 20 U/L (ref 15–41)
Albumin: 4.2 g/dL (ref 3.5–5.0)
Alkaline Phosphatase: 70 U/L (ref 38–126)
Anion gap: 8 (ref 5–15)
BUN: 25 mg/dL — ABNORMAL HIGH (ref 8–23)
CO2: 26 mmol/L (ref 22–32)
Calcium: 9 mg/dL (ref 8.9–10.3)
Chloride: 105 mmol/L (ref 98–111)
Creatinine: 1.22 mg/dL (ref 0.61–1.24)
GFR, Estimated: 59 mL/min — ABNORMAL LOW
Glucose, Bld: 136 mg/dL — ABNORMAL HIGH (ref 70–99)
Potassium: 4.9 mmol/L (ref 3.5–5.1)
Sodium: 139 mmol/L (ref 135–145)
Total Bilirubin: 0.4 mg/dL (ref 0.0–1.2)
Total Protein: 6.8 g/dL (ref 6.5–8.1)

## 2024-06-16 LAB — CBC WITH DIFFERENTIAL (CANCER CENTER ONLY)
Abs Immature Granulocytes: 0.02 K/uL (ref 0.00–0.07)
Basophils Absolute: 0 K/uL (ref 0.0–0.1)
Basophils Relative: 1 %
Eosinophils Absolute: 0.1 K/uL (ref 0.0–0.5)
Eosinophils Relative: 1 %
HCT: 32.8 % — ABNORMAL LOW (ref 39.0–52.0)
Hemoglobin: 11.5 g/dL — ABNORMAL LOW (ref 13.0–17.0)
Immature Granulocytes: 1 %
Lymphocytes Relative: 16 %
Lymphs Abs: 0.7 K/uL (ref 0.7–4.0)
MCH: 30.7 pg (ref 26.0–34.0)
MCHC: 35.1 g/dL (ref 30.0–36.0)
MCV: 87.5 fL (ref 80.0–100.0)
Monocytes Absolute: 0.5 K/uL (ref 0.1–1.0)
Monocytes Relative: 11 %
Neutro Abs: 3 K/uL (ref 1.7–7.7)
Neutrophils Relative %: 70 %
Platelet Count: 100 K/uL — ABNORMAL LOW (ref 150–400)
RBC: 3.75 MIL/uL — ABNORMAL LOW (ref 4.22–5.81)
RDW: 13.3 % (ref 11.5–15.5)
WBC Count: 4.2 K/uL (ref 4.0–10.5)
nRBC: 0 % (ref 0.0–0.2)

## 2024-06-16 MED ORDER — IOHEXOL 300 MG/ML  SOLN
100.0000 mL | Freq: Once | INTRAMUSCULAR | Status: AC | PRN
  Administered 2024-06-16: 100 mL via INTRAVENOUS

## 2024-06-24 ENCOUNTER — Inpatient Hospital Stay (HOSPITAL_BASED_OUTPATIENT_CLINIC_OR_DEPARTMENT_OTHER): Admitting: Internal Medicine

## 2024-06-24 ENCOUNTER — Inpatient Hospital Stay: Admitting: Nurse Practitioner

## 2024-06-24 ENCOUNTER — Encounter: Payer: Self-pay | Admitting: Nurse Practitioner

## 2024-06-24 VITALS — BP 190/78 | HR 65 | Temp 97.8°F | Resp 17 | Ht 73.0 in | Wt 201.0 lb

## 2024-06-24 DIAGNOSIS — Z87891 Personal history of nicotine dependence: Secondary | ICD-10-CM

## 2024-06-24 DIAGNOSIS — C3492 Malignant neoplasm of unspecified part of left bronchus or lung: Secondary | ICD-10-CM | POA: Diagnosis not present

## 2024-06-24 DIAGNOSIS — Z515 Encounter for palliative care: Secondary | ICD-10-CM | POA: Diagnosis not present

## 2024-06-24 DIAGNOSIS — C7951 Secondary malignant neoplasm of bone: Secondary | ICD-10-CM | POA: Diagnosis not present

## 2024-06-24 DIAGNOSIS — R52 Pain, unspecified: Secondary | ICD-10-CM | POA: Diagnosis not present

## 2024-06-24 DIAGNOSIS — C3490 Malignant neoplasm of unspecified part of unspecified bronchus or lung: Secondary | ICD-10-CM | POA: Diagnosis not present

## 2024-06-24 DIAGNOSIS — C3431 Malignant neoplasm of lower lobe, right bronchus or lung: Secondary | ICD-10-CM | POA: Diagnosis not present

## 2024-06-24 NOTE — Progress Notes (Signed)
 "     [facility] [phone]   [phone]  OFFICE PROGRESS NOTE  Chad Santos, MD [address] [place] [address]  DIAGNOSIS:  Recurrent/metastatic (T1c, N3, M1b) non-small cell lung cancer, adenocarcinoma initially diagnosed as early stage disease, stage I in March 2024 involving the right lower lobe and left upper lobe status post SBRT completed in May 2024.  The patient presented with recurrent and metastatic disease to bilateral supraclavicular, subpectoral, left axillary as well as upper abdominal lymphadenopathy and malignant left pleural effusion in December 2024 with confirmation of malignancy from the pleural effusion.   Biomarker Findings HRD signature - Cannot Be Determined Microsatellite status - Cannot Be Determined ? Tumor Mutational Burden - Cannot Be Determined Genomic Findings For a complete list of the genes assayed, please refer to the Appendix. BRAF V600E CHEK2 R474C CTNNB1 S45C DNMT3A W330* PPARG E3K 7 Disease relevant genes with no reportable alterations: ALK, EGFR, ERBB2, KRAS, MET, RET, ROS1  PDL1 TPS 1%  PRIOR THERAPY: None  CURRENT THERAPY:  Encorafenib  450 mg p.o. daily and binimetinib  45 mg p.o. twice daily.  First dose July 22, 2023.  INTERVAL HISTORY: Chad Moreno 83 y.o. male returns to the clinic today for follow-up visit accompanied by his wife. Discussed the use of AI scribe software for clinical note transcription with the patient, who gave verbal consent to proceed.  History of Present Illness Chad Moreno is an 83 year old male with metastatic lung adenocarcinoma (BRAF V600E mutation) who presents for disease monitoring.  He was diagnosed with metastatic non-small cell lung adenocarcinoma in May 2024, with metastatic disease identified in December 2024. Molecular profiling revealed a BRAF V600E mutation (10% expression). He initiated encorafenib  and binimetinib  in January 2025 and  remains on a 50% dose reduction. He undergoes regular surveillance with blood work and CT imaging of the chest, abdomen, and pelvis. He reports feeling well overall, with stable appetite and weight fluctuating between 189 and 195 pounds, attributed to recent travel and dietary changes. He denies new or worsening symptoms related to malignancy.  He continues to experience persistent hoarseness and vocal cord dysfunction. A prior procedure under anesthesia was unsuccessful. He is considering further intervention, including possible vocal cord replacement or injection.  He was hospitalized within the past month for a cardiac event described as a congestive heart attack, which he attributes to an infection. Echocardiogram one month ago showed a left ventricular ejection fraction of 65%. He denies ongoing cardiac symptoms. Home blood pressure readings are typically normal, though elevated today after coffee intake.  He is awaiting information regarding grant approval for his medications for 2026. He and his wife discussed ongoing medication access and anticipate further updates from the pharmacy.    MEDICAL HISTORY: Past Medical History:  Diagnosis Date   Acute hypoxemic respiratory failure (HCC) 01/23/2024   Acute systolic CHF (congestive heart failure) (HCC) 01/24/2024   Age-related cataract of left eye    Amblyopia, right eye    Arthritis    Bacteremia due to Escherichia coli 02/01/2024   Cancer (HCC)    prostate, lungs   CAP (community acquired pneumonia) 01/23/2024   Dysuria    ED (erectile dysfunction)    Elevated PSA    Epidermal cyst    Family history of prostate cancer    Gout    Hip pain    Histoplasmosis    Hyperlipidemia    Hypermetropia, left eye    Hypertension    Hypokalemia 01/25/2024  Impaired glucose tolerance    Joint pain    Left shoulder pain    Lower back pain    Melanocytic nevi, unspecified    Nummular dermatitis    Ocular hypertension    Palmar fascial  fibromatosis    Prostate cancer screening    Regular astigmatism, right eye    Senile nuclear sclerosis    SIRS (systemic inflammatory response syndrome) (HCC) 01/23/2024    ALLERGIES:  is allergic to sulfa antibiotics, ciprofloxacin, and ciprofloxacin hcl.  MEDICATIONS:  Current Outpatient Medications  Medication Sig Dispense Refill   ascorbic acid  (VITAMIN C ) 500 MG tablet Take 1 tablet (500 mg total) by mouth daily.     binimetinib  (MEKTOVI ) 15 MG tablet Take 3 tablets (45 mg total) by mouth 2 (two) times daily. 180 tablet 3   calcium carbonate (OSCAL) 1500 (600 Ca) MG TABS tablet Take 600 mg of elemental calcium by mouth daily with breakfast.     celecoxib  (CELEBREX ) 200 MG capsule TAKE 1 CAPSULE BY MOUTH 2 TIMES A DAY 60 capsule 3   encorafenib  (BRAFTOVI ) 75 MG capsule Take 6 capsules (450 mg total) by mouth daily. 180 capsule 3   fluticasone (FLONASE) 50 MCG/ACT nasal spray Place 2 sprays into both nostrils daily as needed for allergies.     furosemide  (LASIX ) 20 MG tablet Take 1 tablet (20 mg total) by mouth daily as needed. For weight gain or swelling 30 tablet 11   loperamide  (IMODIUM  A-D) 2 MG tablet Take 1 tablet (2 mg total) by mouth 4 (four) times daily as needed for diarrhea or loose stools. (Patient taking differently: Take 2 mg by mouth daily as needed for diarrhea or loose stools.) 30 tablet 0   losartan  (COZAAR ) 25 MG tablet Take 1 tablet (25 mg total) by mouth daily. 90 tablet 2   ondansetron  (ZOFRAN -ODT) 4 MG disintegrating tablet Take 1 tablet (4 mg total) by mouth every 8 (eight) hours as needed for nausea or vomiting. 20 tablet 0   pantoprazole  (PROTONIX ) 40 MG tablet Take 1 tablet (40 mg total) by mouth 2 (two) times daily. (Patient taking differently: Take 40 mg by mouth daily.) 60 tablet 0   POTASSIUM PO Take 1 tablet by mouth daily at 12 noon.     Probiotic Product (PROBIOTIC PEARLS ADVANTAGE PO) Take 1 capsule by mouth in the morning.     protein supplement shake  (PREMIER PROTEIN) LIQD Take 11 oz by mouth daily at 12 noon.     silodosin  (RAPAFLO ) 8 MG CAPS capsule Take 8 mg by mouth daily.     No current facility-administered medications for this visit.    SURGICAL HISTORY:  Past Surgical History:  Procedure Laterality Date   BRONCHIAL BIOPSY  08/27/2022   Procedure: BRONCHIAL BIOPSIES;  Surgeon: Shelah Lamar RAMAN, MD;  Location[facility] ENDOSCOPY;  Service: Pulmonary;;   BRONCHIAL BRUSHINGS  08/27/2022   Procedure: BRONCHIAL BRUSHINGS;  Surgeon: Shelah Lamar RAMAN, MD;  Location: [facility] ENDOSCOPY;  Service: Pulmonary;;   BRONCHIAL NEEDLE ASPIRATION BIOPSY  08/27/2022   Procedure: BRONCHIAL NEEDLE ASPIRATION BIOPSIES;  Surgeon: Shelah Lamar RAMAN, MD;  Location: [facility] ENDOSCOPY;  Service: Pulmonary;;   CONSTRICTING FINGER RING EXCISION Left    CYST REMOVAL TRUNK Left    left posterior shoulder   FIDUCIAL MARKER PLACEMENT  08/27/2022   Procedure: FIDUCIAL MARKER PLACEMENT;  Surgeon: Shelah Lamar RAMAN, MD;  Location: [facility] ENDOSCOPY;  Service: Pulmonary;;   GOLD SEED IMPLANT N/A 10/02/2022   Procedure: GOLD SEED IMPLANT;  Surgeon: Lovie Arlyss CROME, MD;  Location: [facility];  Service: Urology;  Laterality: N/A;   index finger Left    IR FACET JT INJ C/T  2ND LEVEL RIGHT W/FL/CT  01/20/2024   IR FACET JT INJ C/T  SINGLE LEVEL RIGHT W/FL/CT  01/20/2024   IR FACET JT INJ C/T 3RD PLUS LEVEL RIGHT W/FL/CT  01/20/2024   IR RADIOLOGIST EVAL & MGMT  02/19/2024   IR THORACENTESIS ASP PLEURAL SPACE W/IMG GUIDE  06/14/2023   MICROLARYNGOSCOPY W/VOCAL CORD INJECTION Bilateral 05/19/2024   Procedure: MICROLARYNGOSCOPY, WITH VOCAL CORD INJECTION;  Surgeon: Mila Adah SAUNDERS, DO;  Location: [facility] OR;  Service: ENT;  Laterality: Bilateral;   Prostate needle biopsy     SPACE OAR INSTILLATION N/A 10/02/2022   Procedure: SPACE OAR INSTILLATION;  Surgeon: Lovie Arlyss CROME, MD;  Location: [facility];  Service: Urology;  Laterality: N/A;    REVIEW OF SYSTEMS:  Constitutional:  positive for fatigue Eyes: negative Ears, nose, mouth, throat, and face: positive for hoarseness Respiratory: negative Cardiovascular: negative Gastrointestinal: negative Genitourinary:negative Integument/breast: negative Hematologic/lymphatic: negative Musculoskeletal:negative Neurological: negative Behavioral/Psych: negative Endocrine: negative Allergic/Immunologic: negative   PHYSICAL EXAMINATION: General appearance: alert, cooperative, fatigued, and no distress Head: Normocephalic, without obvious abnormality, atraumatic Neck: no adenopathy, no JVD, supple, symmetrical, trachea midline, and thyroid  not enlarged, symmetric, no tenderness/mass/nodules Lymph nodes: Cervical, supraclavicular, and axillary nodes normal. Resp: clear to auscultation bilaterally Back: symmetric, no curvature. ROM normal. No CVA tenderness. Cardio: regular rate and rhythm, S1, S2 normal, no murmur, click, rub or gallop GI: soft, non-tender; bowel sounds normal; no masses,  no organomegaly Extremities: extremities normal, atraumatic, no cyanosis or edema Neurologic: Alert and oriented X 3, normal strength and tone. Normal symmetric reflexes. Normal coordination and gait  ECOG PERFORMANCE STATUS: 1 - Symptomatic but completely ambulatory  Blood pressure (!) 188/77, pulse 65, temperature 97.8 F (36.6 C), temperature source Temporal, resp. rate 17, height 6' 1 (1.854 m), weight 201 lb (91.2 kg), SpO2 100%.  LABORATORY DATA: Lab Results  Component Value Date   WBC 4.2 06/16/2024   HGB 11.5 (L) 06/16/2024   HCT 32.8 (L) 06/16/2024   MCV 87.5 06/16/2024   PLT 100 (L) 06/16/2024      Chemistry      Component Value Date/Time   NA 139 06/16/2024 1000   K 4.9 06/16/2024 1000   CL 105 06/16/2024 1000   CO2 26 06/16/2024 1000   BUN 25 (H) 06/16/2024 1000   CREATININE 1.22 06/16/2024 1000      Component Value Date/Time   CALCIUM 9.0 06/16/2024 1000   ALKPHOS 70 06/16/2024 1000   AST 20 06/16/2024  1000   ALT 12 06/16/2024 1000   BILITOT 0.4 06/16/2024 1000       RADIOGRAPHIC STUDIES: CT CHEST ABDOMEN PELVIS W CONTRAST Result Date: 06/16/2024 CLINICAL DATA:  Non-small cell lung cancer (NSCLC), staging. * Tracking Code: BO * EXAM: CT CHEST, ABDOMEN, AND PELVIS WITH CONTRAST TECHNIQUE: Multidetector CT imaging of the chest, abdomen and pelvis was performed following the standard protocol during bolus administration of intravenous contrast. RADIATION DOSE REDUCTION: This exam was performed according to the departmental dose-optimization program which includes automated exposure control, adjustment of the mA and/or kV according to patient size and/or use of iterative reconstruction technique. CONTRAST:  OMNIPAQUE  IOHEXOL  300 MG/ML  SOLN COMPARISON:  CT scan chest, abdomen and pelvis from 03/13/2024. FINDINGS: CT CHEST FINDINGS Cardiovascular: Normal cardiac size. No pericardial effusion. No aortic aneurysm. There  are coronary artery calcifications, in keeping with coronary artery disease. There are also mild-to-moderate peripheral atherosclerotic vascular calcifications of thoracic aorta and its major branches. Mediastinum/Nodes: Stable subcentimeter sized hypoattenuating nodule in the inferior right thyroid  lobe, incompletely characterized on the current exam but grossly unchanged since the prior study. No solid / cystic mediastinal masses. The esophagus is nondistended precluding optimal assessment. No axillary, mediastinal or hilar lymphadenopathy by size criteria. Lungs/Pleura: The central tracheo-bronchial tree is patent. Redemonstration of pre-existing bilateral lung opacities. For example, there is an irregular part solid opacity in the left upper lobe, near the mediastinal pleura (series 7, image 55), which is unchanged. There are also 2 homogeneous crescentic opacities (in the left upper lobe, along the left major fissure and in the right lower lobe. Both of the opacities exhibit adjacent  fiducial markers. These are essentially unchanged since the prior study. There is resolution of previously noted nodule in the paramedian right lower lobe. There is interval decrease in the previously noted nodular opacities in the dependent right lower lobe. Findings favor resolving infection/inflammation. No new mass or consolidation. There is trace left pleural effusion, significantly decreased since the prior study. No right pleural effusion. No suspicious lung nodule. Musculoskeletal: The visualized soft tissues of the chest wall are grossly unremarkable. Redemonstration of multiple sclerotic metastatic lesions for example in the right sixth and ninth ribs without pathological fracture, unchanged. There are mild multilevel degenerative changes in the visualized spine. CT ABDOMEN PELVIS FINDINGS Hepatobiliary: The liver is normal in size. Non-cirrhotic configuration. No suspicious mass. No intrahepatic or extrahepatic bile duct dilation. No calcified gallstones. Normal gallbladder wall thickness. No pericholecystic inflammatory changes. Pancreas: Unremarkable. No pancreatic ductal dilatation or surrounding inflammatory changes. Spleen: Within normal limits. No focal lesion. Adrenals/Urinary Tract: Adrenal glands are unremarkable. No suspicious renal mass. There is a stable cyst arising from the right kidney upper pole anterolaterally measuring 1.4 x 1.4 cm. No nephroureterolithiasis or obstructive uropathy. Urinary bladder is partially distended and exhibit mild diffuse circumferential wall thickening and subtle perivesical fat stranding, favoring cystitis. Correlate clinically and with urinalysis. No focal mass or bladder calculi. Stomach/Bowel: No disproportionate dilation of the small or large bowel loops. No evidence of abnormal bowel wall thickening or inflammatory changes. The appendix is unremarkable. There are multiple diverticula throughout the colon, without imaging signs of diverticulitis.  Vascular/Lymphatic: No ascites or pneumoperitoneum. No abdominal or pelvic lymphadenopathy, by size criteria. No aneurysmal dilation of the major abdominal arteries. There are moderate peripheral atherosclerotic vascular calcifications of the aorta and its major branches. Reproductive: Normal size prostate. Symmetric seminal vesicles. Fiducial markers noted in the prostate gland. Other: There are small fat containing umbilical and left inguinal hernias. The soft tissues and abdominal wall are otherwise unremarkable. Musculoskeletal: Redemonstration of sclerotic lesions in the left iliac bone and left side of sacrum, essentially similar to the prior study. No pathological fracture There are moderate multilevel degenerative changes in the visualized spine. IMPRESSION: 1. Essentially stable exam. 2. Stable treated masses in the left lung upper lobe and right lower lobe. 3. No new metastatic disease identified within the chest, abdomen or pelvis. 4. Multiple other nonacute observations, as described above. Aortic Atherosclerosis (ICD10-I70.0). Electronically Signed   By: Ree Molt M.D.   On: 06/16/2024 14:23      ASSESSMENT AND PLAN: This is a very pleasant 83 years old white male with Recurrent/metastatic (T1c, N3, M1b) non-small cell lung cancer, adenocarcinoma initially diagnosed as early stage disease, stage I in March 2024 involving  the right lower lobe and left upper lobe status post SBRT completed in May 2024.  The patient presented with recurrent and metastatic disease to bilateral supraclavicular, subpectoral, left axillary as well as upper abdominal lymphadenopathy and malignant left pleural effusion in December 2024 with confirmation of malignancy from the pleural effusion. Molecular studies by foundation 1 showed positive BRAF V600E mutation and PD-L1 expression of 1%. The patient is currently on treatment with encorafenib  450 mg p.o. twice daily in addition to binimetinib  45 mg milligram p.o.  twice daily.  First dose was July 22, 2023. He continues to tolerate this treatment fairly well with no concerning adverse effects.  He is currently on a 50% dose reduction of encorafenib  and binimetinib  and tolerating it fairly well. He had repeat CT scan of the chest, abdomen and pelvis performed recently.  I personally and independently reviewed the scan and discussed the result with the patient and his wife.  His scan showed no concerning findings for disease progression. Assessment and Plan Assessment & Plan Metastatic lung adenocarcinoma with BRAF V600E mutation He has metastatic lung adenocarcinoma with BRAF V600E mutation, currently managed with encorafenib  and binimetinib  at a 50% dose reduction. He is tolerating therapy well, with preserved appetite and weight, and no new or worsening symptoms related to malignancy. Recent CT imaging demonstrates no evidence of disease progression or new metastatic sites. He was recently hospitalized for a cardiac event, likely secondary to infection, with preserved cardiac function. Blood pressure remains well controlled at home. He is clinically stable from an oncologic perspective. - Reviewed recent CT scan; no evidence of progression or new metastases. - Continued encorafenib  and binimetinib  at current dose. - Ordered repeat blood work for routine monitoring. - Discussed grant application status for medication coverage for 2026; awaiting pharmacy update. - Planned next follow-up in two months with routine blood work; no imaging scheduled for that visit. He was advised to call immediately if he has any other concerning symptoms in the interval.  The patient voices understanding of current disease status and treatment options and is in agreement with the current care plan.  All questions were answered. The patient knows to call the clinic with any problems, questions or concerns. We can certainly see the patient much sooner if necessary.  The total  time spent in the appointment was 30 minutes including review of chart and various tests results, discussions about plan of care and coordination of care plan .   Disclaimer: This note was dictated with voice recognition software. Similar sounding words can inadvertently be transcribed and may not be corrected upon review.        "

## 2024-06-24 NOTE — Progress Notes (Signed)
 "    Palliative Medicine [facility]  [phone] [phone]) [phone]   Name: Chad Moreno Date: 06/24/2024 MRN: [id number]  DOB: 08/19/1940  Patient Care Team: [name], MD as PCP - General (Internal Medicine) Jordan, Peter M, MD as PCP - Cardiology (Cardiology) Livingston Rigg, MD as Consulting Physician (Dermatology) Vertell Pont, RN as Oncology Nurse Navigator Bouchard, Duwaine BROCKS, RN as Oncology Nurse Navigator Pickenpack-Cousar, Fannie SAILOR, NP as Nurse Practitioner (Hospice and Palliative Medicine) [name], NP as Nurse Practitioner (Cardiology)   INTERVAL HISTORY: Chad Moreno is a 83 y.o. male with  oncologic medical history including recurrent metastatic non-small cell lung cancer initially diagnosed in March 2024 s/p SBRT.  Now with metastatic disease including left axillary, bilateral supraclavicular, abdominal lymphadenopathy, and malignant pleural effusion (05/2023) s/p palliative radiation to right chest completed April 2025.  Patient currently on Encorafenib  and binimetinib .  Palliative is seeing patient for symptom management and goals of care.   SOCIAL HISTORY:     reports that he quit smoking about 45 years ago. His smoking use included cigarettes. He started smoking about 55 years ago. He has a 5 pack-year smoking history. He has never used smokeless tobacco. He reports current alcohol  use of about 3.0 standard drinks of alcohol  per week. He reports that he does not use drugs.  ADVANCE DIRECTIVES:  Patient to bring in documents to be scanned on file. He reports his wife Chad Moreno is his museum/gallery exhibitions officer.   CODE STATUS: DNR  PAST MEDICAL HISTORY: Past Medical History:  Diagnosis Date   Acute hypoxemic respiratory failure (HCC) 01/23/2024   Acute systolic CHF (congestive heart failure) (HCC) 01/24/2024   Age-related cataract of left eye    Amblyopia, right eye    Arthritis    Bacteremia due to Escherichia  coli 02/01/2024   Cancer (HCC)    prostate, lungs   CAP (community acquired pneumonia) 01/23/2024   Dysuria    ED (erectile dysfunction)    Elevated PSA    Epidermal cyst    Family history of prostate cancer    Gout    Hip pain    Histoplasmosis    Hyperlipidemia    Hypermetropia, left eye    Hypertension    Hypokalemia 01/25/2024   Impaired glucose tolerance    Joint pain    Left shoulder pain    Lower back pain    Melanocytic nevi, unspecified    Nummular dermatitis    Ocular hypertension    Palmar fascial fibromatosis    Prostate cancer screening    Regular astigmatism, right eye    Senile nuclear sclerosis    SIRS (systemic inflammatory response syndrome) (HCC) 01/23/2024    ALLERGIES:  is allergic to sulfa antibiotics, ciprofloxacin, and ciprofloxacin hcl.  MEDICATIONS:  Current Outpatient Medications  Medication Sig Dispense Refill   ascorbic acid  (VITAMIN C ) 500 MG tablet Take 1 tablet (500 mg total) by mouth daily.     binimetinib  (MEKTOVI ) 15 MG tablet Take 3 tablets (45 mg total) by mouth 2 (two) times daily. 180 tablet 3   calcium carbonate (OSCAL) 1500 (600 Ca) MG TABS tablet Take 600 mg of elemental calcium by mouth daily with breakfast.     celecoxib  (CELEBREX ) 200 MG capsule TAKE 1 CAPSULE BY MOUTH 2 TIMES A DAY 60 capsule 3   encorafenib  (BRAFTOVI ) 75 MG capsule Take 6 capsules (450 mg total) by mouth daily. 180 capsule 3   fluticasone (FLONASE) 50 MCG/ACT nasal spray  Place 2 sprays into both nostrils daily as needed for allergies.     furosemide  (LASIX ) 20 MG tablet Take 1 tablet (20 mg total) by mouth daily as needed. For weight gain or swelling 30 tablet 11   loperamide  (IMODIUM  A-D) 2 MG tablet Take 1 tablet (2 mg total) by mouth 4 (four) times daily as needed for diarrhea or loose stools. (Patient taking differently: Take 2 mg by mouth daily as needed for diarrhea or loose stools.) 30 tablet 0   losartan  (COZAAR ) 25 MG tablet Take 1 tablet (25 mg total)  by mouth daily. 90 tablet 2   ondansetron  (ZOFRAN -ODT) 4 MG disintegrating tablet Take 1 tablet (4 mg total) by mouth every 8 (eight) hours as needed for nausea or vomiting. 20 tablet 0   pantoprazole  (PROTONIX ) 40 MG tablet Take 1 tablet (40 mg total) by mouth 2 (two) times daily. (Patient taking differently: Take 40 mg by mouth daily.) 60 tablet 0   POTASSIUM PO Take 1 tablet by mouth daily at 12 noon.     Probiotic Product (PROBIOTIC PEARLS ADVANTAGE PO) Take 1 capsule by mouth in the morning.     protein supplement shake (PREMIER PROTEIN) LIQD Take 11 oz by mouth daily at 12 noon.     silodosin  (RAPAFLO ) 8 MG CAPS capsule Take 8 mg by mouth daily.     No current facility-administered medications for this visit.    VITAL SIGNS: There were no vitals taken for this visit. There were no vitals filed for this visit.  Estimated body mass index is 26.52 kg/m as calculated from the following:   Height as of an earlier encounter on 06/24/24: 6' 1 (1.854 m).   Weight as of an earlier encounter on 06/24/24: 201 lb (91.2 kg).   PERFORMANCE STATUS (ECOG) : 1 - Symptomatic but completely ambulatory  Assessment NAD RRR Normal breathing pattern AAO x3  IMPRESSION: Discussed the use of AI scribe software for clinical note transcription with the patient, who gave verbal consent to proceed.  History of Present Illness Chad Moreno is an 83 year old male with metastatic cancer who presented to the clinic for symptom management follow-up. His wife is also present. Denies concerns of nausea, vomiting, constipation, or diarrhea. He is doing well overall.   Recently traveled to the [employer] for the holidays which he enjoyed.   He continues to experience variability in his voice quality due to ongoing vocal cord issues. Despite undergoing a procedure under anesthesia, his vocal cord dysfunction persists. He describes some days as having a 'whisper' quality to his voice, and he struggles  to speak clearly at times. He notes that he has not had two consecutive good days with his voice and appreciative of this.   His weight has been stable, with a home weight of 195.8 pounds, although the clinic scale showed 201 pounds due to wearing shoes and clothes. He recently took Lasix , resulting in a weight loss of about two pounds the next morning. He monitors his blood pressure daily.  His appetite is good, and he has been enjoying the holiday season with good food and family. He mentions that his weight has been consistent with the scale at home, which matches the readings from his primary care doctor's scale.  All symptoms well managed. All questions answered and support provided.   Goals of Care 12/03/23: We discussed his current illness and what it means in the larger context of his on-going co-morbidities. Natural disease trajectory  and expectations were discussed. Chad Moreno and his wife are realistic in their understanding of his metastatic cancer and plan of care.    I empathetically approach discussions regarding advanced directives, CODE STATUS, and healthcare limitations.  Although patient does not have an advanced directive on file he states he does have a completed document.  Encouraged patient to bring in to allow for filing.  Patient states his wife Chad Moreno is his medical decision-maker in the event he is unable to speak for himself.  His desires for natural death with no life-sustaining measures.  DNR/DNI.  No artificial feedings long-term.   Patient and wife are clear in her expressed wishes to continue to treat the treatable allow him every opportunity to continue to thrive.  His quality of life is most important to him.  Will like his symptoms to be managed to minimize discomfort.    I discussed the importance of continued conversation with family and their medical providers regarding overall plan of care and treatment options, ensuring decisions are within the context of the  patients values and GOCs. Assessment & Plan Pain Much improved and reports pretty much resolved s/p paravertebral nerve block on 7/28. Only requiring occasional Tylenol  for minors aches.   Malignant neoplasm of lung Recent scan results are satisfactory. He is considering further interventions based on future scans to assess lung status. - Scheduled follow-up appointment in six weeks to review scan results and discuss further interventions.  Unilateral vocal cord paralysis Persistent unilateral vocal cord paralysis with variable voice quality. Previous procedure under sleep did not yield desired results. Considering further interventions such as vocal cord replacement or fat body injection, but hesitant due to limited availability of specialists and potential changes in lung status. - Continue to monitor voice quality and lung status with future scans. - Will consider further interventions if lung status remains stable.  I will plan to see patient back in 6 weeks. Sooner if needed.   Patient expressed understanding and was in agreement with this plan. He also understands that He can call the clinic at any time with any questions, concerns, or complaints.   Any controlled substances utilized were prescribed in the context of palliative care. PDMP has been reviewed.   I personally spent a total of 35 minutes in the care of the patient today including preparing to see the patient, getting/reviewing separately obtained history, documenting clinical information in the EHR, and coordinating care. Visit consisted of counseling and education dealing with the complex and emotionally intense issues of symptom management and palliative care in the setting of serious and potentially life-threatening illness.  Chad Moreno, AGPCNP-BC  Palliative Medicine Team/[facility]    "

## 2024-06-30 ENCOUNTER — Other Ambulatory Visit (HOSPITAL_COMMUNITY): Payer: Self-pay

## 2024-07-02 ENCOUNTER — Ambulatory Visit: Attending: Emergency Medicine | Admitting: Emergency Medicine

## 2024-07-02 ENCOUNTER — Encounter: Payer: Self-pay | Admitting: Emergency Medicine

## 2024-07-02 VITALS — BP 124/62 | HR 66 | Ht 73.0 in | Wt 199.0 lb

## 2024-07-02 DIAGNOSIS — I351 Nonrheumatic aortic (valve) insufficiency: Secondary | ICD-10-CM | POA: Insufficient documentation

## 2024-07-02 DIAGNOSIS — I5020 Unspecified systolic (congestive) heart failure: Secondary | ICD-10-CM | POA: Insufficient documentation

## 2024-07-02 NOTE — Patient Instructions (Signed)
 Medication Instructions:  NO CHANGES  Lab Work: NONE TO BE DONE TODAY.  Testing/Procedures: NONE  Follow-Up: At [facility], you and your health needs are our priority.  As part of our continuing mission to provide you with exceptional heart care, our providers are all part of one team.  This team includes your primary Cardiologist (physician) and Advanced Practice Providers or APPs (Physician Assistants and Nurse Practitioners) who all work together to provide you with the care you need, when you need it.  Your next appointment:   6 MONTHS  Provider:   Peter [place], MD OR Lum Louis, NP

## 2024-07-02 NOTE — Progress Notes (Signed)
 " Cardiology Office Note:    Date:  07/02/2024  ID:  Chad Moreno, DOB 1941/03/10, MRN [id number] PCP: [name], MD  [facility] Providers Cardiologist:  [name], MD Cardiology APP:  [name], NP       Patient Profile:       Chief Complaint: 63-month follow-up History of Present Illness:  Chad Moreno is a 84 y.o. male with visit-pertinent history of  metastatic nSCLC currently on immunotherapy, chronic thrombocytopenia, histoplasmosis, hypertension, hyperlipidemia, systolic heart failure   Echocardiogram 06/2023 showed LVEF 55% with no RWMA. s/p SRB [place] completed 10/2022; onencorafenib and binimetinib  started on 06/2023.   Admitted 12/30/2023 for SBO/GI bleed.  Conservative management was recommended per general surgery.  GI bleed felt to be due to cecal colitis/diverticulosis.  Received PRBCs and IV PPI.  Plan for outpatient GI EGD/colonoscopy.  Course complicated by ESBL UTI treated with antibiotics.  He was discharged home in stable condition   He was readmitted on 01/17/2024 with dyspnea initially and required BiPAP therapy.  Echocardiogram showed LVEF of 30%, RWMA's, mild LVH, RV mildly reduced.  Questionable Takotsubo cardiomyopathy versus LAD infarct versus cardiomyopathy from immunotherapy.  Immunotherapy was held.  Cardiology was consulted.  His high sensitive troponins were mildly elevated but flat and EKG was without acute ST changes.  Blood cultures were positive for a ESBL E. coli and ID was consulted.  He was started on antibiotics and diuresed with IV Lasix  and GDMT was titrated.  Patient wishes not to have invasive procedures.  GDMT was limited by low BP and orthostasis.  He was discharged home with a weight of 188 LBS.   He followed up with oncology on 01/28/2024 and encorafenib  and binimetinib  restarted at reduced doses.    He was last seen for follow-up with heart failure clinic on 02/05/2024.  No shortness of breath with activity or ADLs.   Weight at home was stable, has not needed Lasix .  Has home health PT and home health RN.  He is without any chest pain and not interested in invasive workup.  Cardiac MRI and ischemic workup were deferred.  He was restarted on losartan  25 mg daily (did experience dizziness on Entresto ).  Started on the Toprol was discontinued in the setting of orthostatic hypotension.  SGLT2i was avoided given recent UTIs, bacteremia, and sacral wound.  He is likely not ICD candidate with age and recent bacteremia should EF not improved.  Last seen in clinic by general cardiology on 02/26/2024.  He was not interested in invasive workup and deferred cardiac MRI and catheterization.  Did agree to medication management.  Noted vast improvement in symptoms since immunotherapy drugs were decreased.  He appeared euvolemic and well compensated.  GDMT titration was limited given history of orthostatic hypotension.  Echocardiogram 04/27/2024 showed LVEF 60 to 65%, normal LV function, no RWMA, grade 1 DD, RV function and size normal, normal PASP, mild AI, mild dilation of aortic root measuring 42 mm.   Discussed the use of AI scribe software for clinical note transcription with the patient, who gave verbal consent to proceed.  History of Present Illness Chad Moreno is an 84 year old male who presents for routine cardiovascular follow-up.  Today he is tells me he is doing well without acute cardiovascular concerns or complaints.  He continues immunotherapy for cancer treatment and has occasional fatigue but stays active with walking and plans to to go back to the gym with a trainer.  His  goal is to begin golfing once more this summer.  He has lost weight that he attributes to muscle loss.  He takes his weight daily and denies any weight gain of 3 pounds in 1 day or 5 pounds in 1 week.  Today he denies any dyspnea, chest discomfort, orthopnea, PND, LEE.  Tells me he may have a procedure in March for his vocal cords.  Overall  he is doing well and happy with his current health status.   Review of systems:  Please see the history of present illness. All other systems are reviewed and otherwise negative.      Studies Reviewed:        Echocardiogram 04/27/2024  1. Left ventricular ejection fraction, by estimation, is 60 to 65%. Left  ventricular ejection fraction by 3D volume is 65 %. The left ventricle has  normal function. The left ventricle has no regional wall motion  abnormalities. Left ventricular diastolic   parameters are consistent with Grade I diastolic dysfunction (impaired  relaxation).   2. Right ventricular systolic function is normal. The right ventricular  size is normal. There is normal pulmonary artery systolic pressure.   3. The mitral valve is normal in structure. No evidence of mitral valve  regurgitation. No evidence of mitral stenosis.   4. The aortic valve is tricuspid. Aortic valve regurgitation is mild.  Aortic valve sclerosis/calcification is present, without any evidence of  aortic stenosis.   5. Aortic dilatation noted. There is mild dilatation of the aortic root,  measuring 42 mm.   6. The inferior vena cava is normal in size with greater than 50%  respiratory variability, suggesting right atrial pressure of 3 mmHg.   Echocardiogram 01/24/2024 1. Left ventricular ejection fraction, by estimation, is 30 to 35%. The  left ventricle has moderately decreased function. The left ventricle  demonstrates regional wall motion abnormalities (see scoring  diagram/findings for description). There is mild  left ventricular hypertrophy. Left ventricular diastolic parameters are  indeterminate.   2. Right ventricular systolic function is mildly reduced. The right  ventricular size is normal. Normal RV basal function with apical  hypokinesis   3. Moderate pleural effusion in the left lateral region.   4. The mitral valve was not well visualized. Trivial mitral valve  regurgitation.   5.  The aortic valve was not well visualized. Aortic valve regurgitation  is not visualized. No aortic stenosis is present.   6. The inferior vena cava is normal in size with greater than 50%  respiratory variability, suggesting right atrial pressure of 3 mmHg.    Limited echocardiogram 07/19/2023 1. Left ventricular ejection fraction, by estimation, is 55%. The left  ventricle has normal function. The left ventricle has no regional wall  motion abnormalities. Left ventricular diastolic parameters are consistent  with Grade I diastolic dysfunction  (impaired relaxation).   2. Right ventricular systolic function is normal. The right ventricular  size is normal. Tricuspid regurgitation signal is inadequate for assessing  PA pressure.   3. The aortic valve is tricuspid. There is mild calcification of the  aortic valve. Aortic valve regurgitation is trivial. No aortic stenosis is  present.   4. The inferior vena cava is normal in size with greater than 50%  respiratory variability, suggesting right atrial pressure of 3 mmHg.   5. IVC not visualized.   6. Limited echo.   Risk Assessment/Calculations:              Physical Exam:   VS:  BP 124/62 (BP Location: Left Arm, Patient Position: Sitting, Cuff Size: Normal)   Pulse 66   Ht 6' 1 (1.854 m)   Wt 199 lb (90.3 kg)   BMI 26.25 kg/m    Wt Readings from Last 3 Encounters:  07/02/24 199 lb (90.3 kg)  06/24/24 201 lb (91.2 kg)  05/19/24 191 lb (86.6 kg)    GEN: Well nourished, well developed in no acute distress NECK: No JVD; No carotid bruits CARDIAC: RRR, no murmurs, rubs, gallops RESPIRATORY:  Clear to auscultation without rales, wheezing or rhonchi  ABDOMEN: Soft, non-tender, non-distended EXTREMITIES:  No edema; No acute deformity      Assessment and Plan:  HFimpEF HF diagnosed on 01/22/2024 in the setting of bacteremia Echocardiogram 01/24/2024 with LVEF 30%, RWMA's Repeat echocardiogram 04/2024 showed improved LVEF of 60 to  65%, no RWMA, RV normal - Reduced EF most likely in the setting of stress-induced cardiomyopathy (d/x with ESBL E. coli bacteremia at time of admission) - NYHA class I - Today patient appears euvolemic and well compensated on exam.  No dyspnea, orthopnea, PND, leg swelling.  Weight remains stable.  Not having to take daily loop diuretic therapy - Remains active with daily exercises and plans to get back to the [facility] with a personal trainer this month - Spironolactone  and Entresto  were discontinued in the past due to dizziness/orthostatic hypotension - Continue losartan  25 mg daily and furosemide  20 mg daily as needed   Metastatic lung adenocarcinoma Initially diagnosed 08/2023 Currently on reduced dose of encorafenib  and binimetinib   - Management per oncology  Aortic valve regurgitation Echocardiogram 04/2024 showed mild aortic valve regurgitation - Today he remains asymptomatic without chest discomfort, dyspnea, or syncope - No further interventions warranted at this time      Dispo:  Return in about 6 months (around 12/30/2024).  Signed, Lum LITTIE Louis, NP  "

## 2024-07-08 ENCOUNTER — Other Ambulatory Visit: Payer: Self-pay

## 2024-07-08 NOTE — Progress Notes (Signed)
 Specialty Pharmacy Refill Coordination Note  Chad Moreno is a 83 y.o. male contacted today regarding refills of specialty medication(s) Binimetinib  (MEKTOVI ); Encorafenib  (BRAFTOVI )   Patient requested Delivery   Delivery date: 07/16/24   Verified address: [address]  [place] [id number]   Medication will be filled on: 07/15/24   Patient is aware of the $10.00 copay.

## 2024-07-09 ENCOUNTER — Other Ambulatory Visit: Payer: Self-pay

## 2024-07-09 NOTE — Progress Notes (Signed)
 Specialty Pharmacy Ongoing Clinical Assessment Note  Chad Moreno is a 84 y.o. male who is being followed by the specialty pharmacy service for RxSp Oncology   Patient's specialty medication(s) reviewed today: Binimetinib  (MEKTOVI ); Encorafenib  (BRAFTOVI )  Dr. [name] has lowered his dose down to 2 tablets of Braftovi  per day and 3 of the Mektovi  per day, following a 22 day hospitalization for sepsis and CHF.  Per the patient, the providers are unsure that any of his issues were from his oncology treatment; however, they are going to determine if he remains stable on this lower dose and they will repeat scans every few months.  Missed doses in the last 4 weeks: 0   Patient/Caregiver did not have any additional questions or concerns.   Therapeutic benefit summary: Patient is achieving benefit   Adverse events/side effects summary: No adverse events/side effects   Patient's therapy is appropriate to: Continue    Goals Addressed             This Visit's Progress    Slow Disease Progression   On track    Patient is on track. Patient will maintain adherence. Per OV on 06/24/24, the recent CT showed no concerning findings for disease progression.           Follow up: 3 months  Silvano LOISE Dolly Specialty Pharmacist

## 2024-07-09 NOTE — Progress Notes (Signed)
 Patient called back to cancel fill as his dose was lowered by his provider and he has extra on hand.  Rescheduled next refill call following his next office visit and he is aware to call us  if he needs us  prior to that appointment.

## 2024-07-10 ENCOUNTER — Ambulatory Visit (INDEPENDENT_AMBULATORY_CARE_PROVIDER_SITE_OTHER): Admitting: Otolaryngology

## 2024-07-14 ENCOUNTER — Other Ambulatory Visit (HOSPITAL_COMMUNITY): Payer: Self-pay

## 2024-07-28 ENCOUNTER — Telehealth (INDEPENDENT_AMBULATORY_CARE_PROVIDER_SITE_OTHER): Payer: Self-pay

## 2024-07-28 NOTE — Telephone Encounter (Signed)
 Patient called requesting to speak to Dr. Greggory regarding him needing to get care for his condition at [facility]. I let her know that Dr. Greggory is out of the office until tomorrow and that she can give him a call back when she returns.

## 2024-07-29 ENCOUNTER — Other Ambulatory Visit (INDEPENDENT_AMBULATORY_CARE_PROVIDER_SITE_OTHER): Payer: Self-pay

## 2024-07-29 DIAGNOSIS — J3801 Paralysis of vocal cords and larynx, unilateral: Secondary | ICD-10-CM

## 2024-07-30 ENCOUNTER — Encounter (INDEPENDENT_AMBULATORY_CARE_PROVIDER_SITE_OTHER): Payer: Self-pay

## 2024-07-31 ENCOUNTER — Encounter (INDEPENDENT_AMBULATORY_CARE_PROVIDER_SITE_OTHER): Payer: Self-pay

## 2024-07-31 ENCOUNTER — Ambulatory Visit (INDEPENDENT_AMBULATORY_CARE_PROVIDER_SITE_OTHER)

## 2024-07-31 VITALS — BP 178/75 | HR 67 | Wt 195.0 lb

## 2024-07-31 DIAGNOSIS — R490 Dysphonia: Secondary | ICD-10-CM

## 2024-07-31 DIAGNOSIS — J3801 Paralysis of vocal cords and larynx, unilateral: Secondary | ICD-10-CM

## 2024-07-31 DIAGNOSIS — J383 Other diseases of vocal cords: Secondary | ICD-10-CM

## 2024-07-31 NOTE — Progress Notes (Signed)
 Dear Dr. Janey, Here is my assessment for our mutual patient, Chad Moreno. Thank you for allowing me the opportunity to care for your patient. Please do not hesitate to contact me should you have any other questions. Sincerely, Dr. Hadassah Parody  Otolaryngology Clinic Note Referring provider: Dr. Janey HPI:   (05/11/24) ALMALIK WEISSBERG is a 84 y.o. male who presents as an established patient of the practice with known left vocal cord paralysis and lung cancer.  His wife is present during this encounter to provide history as well.  Patient has undergone previous injection laryngoplasty in office on 09/25/2023.  Patient states after the procedure he did have improvement in his voice.  He states that since then he was admitted to the hospital for a bowel obstruction and required placement of an NG tube.  He states that his voice is now back to being breathy and hoarse.  He presented to clinic today for repeat injection however given complexity in anatomy and patient tolerance the injection was unable to be completed.  --------------------------------------------------------- 06/01/24  Kiptyn Rafuse is a 84 y.o. male presenting post-operatively after vocal fold injection in OR with Dr. Mila.   Overall feels better after surgery but still feels like voice goes to a whisper at times. Generally feels stronger than prior.  No longer gasping for breath when speaking.Cough less prevalent since recent vocal cord injection. No trouble with swallowing.   --------------------------------------------------------- 07/31/2024  Presents for f/u. Requesting referral [facility] for consultation to evaluate candidacy for more permanent solutions for dysphonia in setting of VC paralysis.  His voice has deteriorated since his last injection with Dr. Mila.     Independent Review of Additional Tests or Records:  Note by Adah Mila, DO (04/17/24): discussed possible more permanent solution if needed.   Op  note by Adah Mila, DO (05/19/24): bilateral VF injection with prolaryn    PMH/Meds/All/SocHx/FamHx/ROS:   Past Medical History:  Diagnosis Date   Acute hypoxemic respiratory failure (HCC) 01/23/2024   Acute systolic CHF (congestive heart failure) (HCC) 01/24/2024   Age-related cataract of left eye    Amblyopia, right eye    Arthritis    Bacteremia due to Escherichia coli 02/01/2024   Cancer (HCC)    prostate, lungs   CAP (community acquired pneumonia) 01/23/2024   Dysuria    ED (erectile dysfunction)    Elevated PSA    Epidermal cyst    Family history of prostate cancer    Gout    Hip pain    Histoplasmosis    Hyperlipidemia    Hypermetropia, left eye    Hypertension    Hypokalemia 01/25/2024   Impaired glucose tolerance    Joint pain    Left shoulder pain    Lower back pain    Melanocytic nevi, unspecified    Nummular dermatitis    Ocular hypertension    Palmar fascial fibromatosis    Prostate cancer screening    Regular astigmatism, right eye    Senile nuclear sclerosis    SIRS (systemic inflammatory response syndrome) (HCC) 01/23/2024     Past Surgical History:  Procedure Laterality Date   BRONCHIAL BIOPSY  08/27/2022   Procedure: BRONCHIAL BIOPSIES;  Surgeon: Shelah Lamar RAMAN, MD;  Location: [facility] ENDOSCOPY;  Service: Pulmonary;;   BRONCHIAL BRUSHINGS  08/27/2022   Procedure: BRONCHIAL BRUSHINGS;  Surgeon: Shelah Lamar RAMAN, MD;  Location: [facility] ENDOSCOPY;  Service: Pulmonary;;   BRONCHIAL NEEDLE ASPIRATION BIOPSY  08/27/2022   Procedure: BRONCHIAL NEEDLE ASPIRATION  BIOPSIES;  Surgeon: Shelah Lamar RAMAN, MD;  Location: [facility] ENDOSCOPY;  Service: Pulmonary;;   CONSTRICTING FINGER RING EXCISION Left    CYST REMOVAL TRUNK Left    left posterior shoulder   FIDUCIAL MARKER PLACEMENT  08/27/2022   Procedure: FIDUCIAL MARKER PLACEMENT;  Surgeon: Shelah Lamar RAMAN, MD;  Location: [facility] ENDOSCOPY;  Service: Pulmonary;;   GOLD SEED IMPLANT N/A 10/02/2022   Procedure: GOLD SEED IMPLANT;   Surgeon: Lovie Arlyss CROME, MD;  Location: [facility];  Service: Urology;  Laterality: N/A;   index finger Left    IR FACET JT INJ C/T  2ND LEVEL RIGHT W/FL/CT  01/20/2024   IR FACET JT INJ C/T  SINGLE LEVEL RIGHT W/FL/CT  01/20/2024   IR FACET JT INJ C/T 3RD PLUS LEVEL RIGHT W/FL/CT  01/20/2024   IR RADIOLOGIST EVAL & MGMT  02/19/2024   IR THORACENTESIS RIGHT ASP PLEURAL SPACE W/IMG GUIDE  06/14/2023   MICROLARYNGOSCOPY W/VOCAL CORD INJECTION Bilateral 05/19/2024   Procedure: MICROLARYNGOSCOPY, WITH VOCAL CORD INJECTION;  Surgeon: Mila Adah SAUNDERS, DO;  Location: [facility] OR;  Service: ENT;  Laterality: Bilateral;   Prostate needle biopsy     SPACE OAR INSTILLATION N/A 10/02/2022   Procedure: SPACE OAR INSTILLATION;  Surgeon: Lovie Arlyss CROME, MD;  Location: [facility];  Service: Urology;  Laterality: N/A;    Family History  Problem Relation Age of Onset   Prostate cancer Father      Social Connections: Socially Integrated (01/23/2024)   Social Connection and Isolation Panel    Frequency of Communication with Friends and Family: More than three times a week    Frequency of Social Gatherings with Friends and Family: Once a week    Attends Religious Services: More than 4 times per year    Active Member of [employer] or Organizations: Yes    Attends Engineer, Structural: More than 4 times per year    Marital Status: Married     Current Outpatient Medications  Medication Instructions   ascorbic acid  (VITAMIN C ) 500 mg, Oral, Daily   Braftovi  450 mg, Oral, Daily   calcium carbonate (OSCAL) 1500 (600 Ca) MG TABS tablet 600 mg of elemental calcium, Daily with breakfast   celecoxib  (CELEBREX ) 200 mg, Oral, 2 times daily   fluticasone (FLONASE) 50 MCG/ACT nasal spray 2 sprays, Daily PRN   furosemide  (LASIX ) 20 mg, Oral, Daily PRN, For weight gain or swelling   loperamide  (IMODIUM  A-D) 2 mg, Oral, 4 times daily PRN   losartan  (COZAAR ) 25 mg, Oral, Daily   Mektovi  45  mg, Oral, 2 times daily   ondansetron  (ZOFRAN -ODT) 4 mg, Oral, Every 8 hours PRN   pantoprazole  (PROTONIX ) 40 mg, Oral, 2 times daily   POTASSIUM PO 1 tablet, Daily   Probiotic Product (PROBIOTIC PEARLS ADVANTAGE PO) 1 capsule, Every morning   protein supplement shake (PREMIER PROTEIN) LIQD 11 oz, Daily   silodosin  (RAPAFLO ) 8 mg, Daily     Physical Exam:   BP (!) 178/75 (BP Location: Left Arm, Patient Position: Sitting, Cuff Size: Normal) Comment: pt just had cup of coffee  Pulse 67   Wt 195 lb (88.5 kg)   SpO2 98%   BMI 25.73 kg/m   Salient findings:  CN II-XII intact Bilateral EAC clear and TM intact with well pneumatized middle ear spaces Dysphonic breathy  No lesions of oral cavity/oropharynx No obviously palpable neck masses/lymphadenopathy/thyromegaly No respiratory distress or stridor  Seprately Identifiable Procedures:  Prior to initiating any  procedures, risks/benefits/alternatives were explained to the patient and verbal consent obtained.  Procedure Note (06/01/24) Pre-procedure diagnosis:  Left vocal cord paralysis, age related VF atrophy  Post-procedure diagnosis: Same Procedure: Transnasal Fiberoptic Laryngoscopy, CPT [phone] - Mod 25 Indication: Left vocal cord paralysis, age related VF atrophy  Complications: None apparent EBL: 0 mL  The procedure was undertaken to further evaluate the patient's complaint of Left vocal cord paralysis, age related VF atrophy , with mirror exam inadequate for appropriate examination due to gag reflex and poor patient tolerance  Procedure:  Patient was identified as correct patient. Verbal consent was obtained. The nose was sprayed with oxymetazoline  and 4% lidocaine . The The flexible laryngoscope was passed through the nose to view the nasal cavity, pharynx (oropharynx, hypopharynx) and larynx.  The larynx was examined at rest and during multiple phonatory tasks. Documentation was obtained and reviewed with patient. The scope was  removed. The patient tolerated the procedure well.  Findings: The nasal cavity and nasopharynx did not reveal any masses or lesions, mucosa appeared to be without obvious lesions. The tongue base, pharyngeal walls, piriform sinuses, vallecula, epiglottis and postcricoid region are normal in appearance EXCEPT: Left vocal cord paralysis. Right cord meeting left but there remains small 1mm glottic gap. The visualized portion of the subglottis and proximal trachea is widely patent. The vocal folds are mobile bilaterally. There are no lesions on the free edge of the vocal folds nor elsewhere in the larynx worrisome for malignancy.    Electronically signed by: Hadassah JAYSON Parody, MD 07/31/2024 10:36 AM   Impression & Plans:  Brack Shaddock is a 84 y.o. male with   1. Paralysis of left vocal fold   2. Vocal fold atrophy   3. Glottic insufficiency   4. Dysphonia     Assessment and Plan Assessment & Plan  Left vocal fold paralysis Vocal fold atrophy Chronic left vocal fold paralysis, likely secondary to compression of recurrent laryngeal nerve in setting of lung cancer. Requesting referral for evaluation of candidacy for medialization laryngoplasty versus repeat injection laryngoplasty.   - Referral to [facility] placed  - Confirmed that the operative note will be sent to the insurance company as requested. - He wants to keep his follow-up with us  here in June with Dr. Mila as well   Lung cancer - continue f/u with oncology   I personally spent a total of 35 minutes in the care of the patient today including preparing to see the patient, getting/reviewing separately obtained history, counseling and educating, and documenting clinical information in the EHR.  See below regarding exact medications prescribed this encounter including dosages and route: No orders of the defined types were placed in this encounter.   Thank you for allowing me the opportunity to care for your patient. Please do  not hesitate to contact me should you have any other questions.  Sincerely, Hadassah Parody, MD Otolaryngologist (ENT), [facility] Phone: [phone] Fax: [phone]

## 2024-08-05 ENCOUNTER — Inpatient Hospital Stay

## 2024-08-05 ENCOUNTER — Inpatient Hospital Stay: Admitting: Internal Medicine

## 2024-08-05 ENCOUNTER — Inpatient Hospital Stay: Attending: Internal Medicine

## 2024-11-30 ENCOUNTER — Ambulatory Visit (INDEPENDENT_AMBULATORY_CARE_PROVIDER_SITE_OTHER)
